# Patient Record
Sex: Female | Born: 1960 | Race: White | Hispanic: No | Marital: Married | Smoking: Never smoker
Health system: Southern US, Community
[De-identification: ages and names within clinical notes are randomized; demographics above are authoritative.]

## PROBLEM LIST (undated history)

## (undated) DIAGNOSIS — Z7901 Long term (current) use of anticoagulants: Secondary | ICD-10-CM

## (undated) DIAGNOSIS — Z5189 Encounter for other specified aftercare: Secondary | ICD-10-CM

## (undated) DIAGNOSIS — C19 Malignant neoplasm of rectosigmoid junction: Secondary | ICD-10-CM

## (undated) DIAGNOSIS — F329 Major depressive disorder, single episode, unspecified: Secondary | ICD-10-CM

## (undated) DIAGNOSIS — G620 Drug-induced polyneuropathy: Secondary | ICD-10-CM

## (undated) DIAGNOSIS — D6481 Anemia due to antineoplastic chemotherapy: Secondary | ICD-10-CM

## (undated) DIAGNOSIS — N1330 Unspecified hydronephrosis: Secondary | ICD-10-CM

## (undated) DIAGNOSIS — K219 Gastro-esophageal reflux disease without esophagitis: Secondary | ICD-10-CM

## (undated) DIAGNOSIS — Z86711 Personal history of pulmonary embolism: Secondary | ICD-10-CM

## (undated) DIAGNOSIS — F419 Anxiety disorder, unspecified: Secondary | ICD-10-CM

## (undated) DIAGNOSIS — Z923 Personal history of irradiation: Secondary | ICD-10-CM

## (undated) DIAGNOSIS — F32A Depression, unspecified: Secondary | ICD-10-CM

## (undated) DIAGNOSIS — T7840XA Allergy, unspecified, initial encounter: Secondary | ICD-10-CM

## (undated) DIAGNOSIS — R300 Dysuria: Secondary | ICD-10-CM

## (undated) DIAGNOSIS — T451X5A Adverse effect of antineoplastic and immunosuppressive drugs, initial encounter: Secondary | ICD-10-CM

## (undated) HISTORY — PX: COLONOSCOPY: SHX174

## (undated) HISTORY — DX: Encounter for other specified aftercare: Z51.89

## (undated) HISTORY — DX: Allergy, unspecified, initial encounter: T78.40XA

---

## 2014-10-03 ENCOUNTER — Encounter (HOSPITAL_COMMUNITY): Payer: Self-pay | Admitting: *Deleted

## 2014-10-03 ENCOUNTER — Inpatient Hospital Stay (HOSPITAL_COMMUNITY): Payer: BC Managed Care – PPO

## 2014-10-03 ENCOUNTER — Inpatient Hospital Stay (HOSPITAL_COMMUNITY)
Admission: AD | Admit: 2014-10-03 | Discharge: 2014-10-18 | DRG: 330 | Disposition: A | Discharge status: Home / Self Care | Payer: BC Managed Care – PPO | Source: Other Acute Inpatient Hospital | Attending: General Surgery | Admitting: General Surgery

## 2014-10-03 DIAGNOSIS — K5190 Ulcerative colitis, unspecified, without complications: Secondary | ICD-10-CM | POA: Diagnosis present

## 2014-10-03 DIAGNOSIS — D638 Anemia in other chronic diseases classified elsewhere: Secondary | ICD-10-CM | POA: Diagnosis present

## 2014-10-03 DIAGNOSIS — F329 Major depressive disorder, single episode, unspecified: Secondary | ICD-10-CM | POA: Diagnosis present

## 2014-10-03 DIAGNOSIS — C187 Malignant neoplasm of sigmoid colon: Secondary | ICD-10-CM | POA: Diagnosis present

## 2014-10-03 DIAGNOSIS — C172 Malignant neoplasm of ileum: Principal | ICD-10-CM | POA: Diagnosis present

## 2014-10-03 DIAGNOSIS — Z800 Family history of malignant neoplasm of digestive organs: Secondary | ICD-10-CM

## 2014-10-03 DIAGNOSIS — E875 Hyperkalemia: Secondary | ICD-10-CM | POA: Diagnosis not present

## 2014-10-03 DIAGNOSIS — E785 Hyperlipidemia, unspecified: Secondary | ICD-10-CM | POA: Diagnosis present

## 2014-10-03 DIAGNOSIS — Z8709 Personal history of other diseases of the respiratory system: Secondary | ICD-10-CM

## 2014-10-03 DIAGNOSIS — K56600 Partial intestinal obstruction, unspecified as to cause: Secondary | ICD-10-CM

## 2014-10-03 DIAGNOSIS — C179 Malignant neoplasm of small intestine, unspecified: Secondary | ICD-10-CM

## 2014-10-03 DIAGNOSIS — N131 Hydronephrosis with ureteral stricture, not elsewhere classified: Secondary | ICD-10-CM | POA: Diagnosis present

## 2014-10-03 DIAGNOSIS — K567 Ileus, unspecified: Secondary | ICD-10-CM | POA: Diagnosis not present

## 2014-10-03 DIAGNOSIS — K219 Gastro-esophageal reflux disease without esophagitis: Secondary | ICD-10-CM | POA: Diagnosis present

## 2014-10-03 DIAGNOSIS — N1330 Unspecified hydronephrosis: Secondary | ICD-10-CM

## 2014-10-03 DIAGNOSIS — R109 Unspecified abdominal pain: Secondary | ICD-10-CM | POA: Diagnosis present

## 2014-10-03 DIAGNOSIS — D72829 Elevated white blood cell count, unspecified: Secondary | ICD-10-CM | POA: Diagnosis not present

## 2014-10-03 DIAGNOSIS — Z95828 Presence of other vascular implants and grafts: Secondary | ICD-10-CM

## 2014-10-03 DIAGNOSIS — K56609 Unspecified intestinal obstruction, unspecified as to partial versus complete obstruction: Secondary | ICD-10-CM | POA: Diagnosis present

## 2014-10-03 HISTORY — DX: Depression, unspecified: F32.A

## 2014-10-03 HISTORY — DX: Gastro-esophageal reflux disease without esophagitis: K21.9

## 2014-10-03 HISTORY — DX: Major depressive disorder, single episode, unspecified: F32.9

## 2014-10-03 LAB — CBC
HCT: 29.7 % — ABNORMAL LOW (ref 36.0–46.0)
HEMOGLOBIN: 8.5 g/dL — AB (ref 12.0–15.0)
MCH: 20.5 pg — AB (ref 26.0–34.0)
MCHC: 28.6 g/dL — AB (ref 30.0–36.0)
MCV: 71.7 fL — ABNORMAL LOW (ref 78.0–100.0)
Platelets: 335 10*3/uL (ref 150–400)
RBC: 4.14 MIL/uL (ref 3.87–5.11)
RDW: 18.4 % — ABNORMAL HIGH (ref 11.5–15.5)
WBC: 6.7 10*3/uL (ref 4.0–10.5)

## 2014-10-03 LAB — BASIC METABOLIC PANEL
Anion gap: 15 (ref 5–15)
BUN: 12 mg/dL (ref 6–23)
CALCIUM: 8.5 mg/dL (ref 8.4–10.5)
CHLORIDE: 102 meq/L (ref 96–112)
CO2: 21 mEq/L (ref 19–32)
Creatinine, Ser: 0.86 mg/dL (ref 0.50–1.10)
GFR, EST AFRICAN AMERICAN: 88 mL/min — AB (ref >90)
GFR, EST NON AFRICAN AMERICAN: 76 mL/min — AB (ref >90)
Glucose, Bld: 132 mg/dL — ABNORMAL HIGH (ref 70–99)
Potassium: 3.6 mEq/L — ABNORMAL LOW (ref 3.7–5.3)
Sodium: 138 mEq/L (ref 137–147)

## 2014-10-03 MED ORDER — HYDROMORPHONE HCL 1 MG/ML IJ SOLN
0.5000 mg | INTRAMUSCULAR | Status: DC | PRN
  Administered 2014-10-03 – 2014-10-06 (×3): 1 mg via INTRAVENOUS
  Administered 2014-10-06: 0.5 mg via INTRAVENOUS
  Filled 2014-10-03 (×4): qty 1

## 2014-10-03 MED ORDER — ONDANSETRON HCL 4 MG/2ML IJ SOLN: 4.0000 mg | Freq: Four times a day (QID) | INTRAMUSCULAR | Status: DC | PRN

## 2014-10-03 MED ORDER — ACETAMINOPHEN 650 MG RE SUPP: 650.0000 mg | Freq: Four times a day (QID) | RECTAL | Status: DC | PRN

## 2014-10-03 MED ORDER — DIPHENHYDRAMINE HCL 50 MG/ML IJ SOLN: 12.5000 mg | Freq: Four times a day (QID) | INTRAMUSCULAR | Status: DC | PRN

## 2014-10-03 MED ORDER — PANTOPRAZOLE SODIUM 40 MG IV SOLR
40.0000 mg | Freq: Every day | INTRAVENOUS | Status: DC
  Administered 2014-10-03 – 2014-10-15 (×14): 40 mg via INTRAVENOUS
  Filled 2014-10-03 (×22): qty 40

## 2014-10-03 MED ORDER — ZOLPIDEM TARTRATE 5 MG PO TABS
5.0000 mg | ORAL_TABLET | Freq: Every evening | ORAL | Status: DC | PRN
  Administered 2014-10-03 – 2014-10-16 (×9): 5 mg via ORAL
  Filled 2014-10-03 (×10): qty 1

## 2014-10-03 MED ORDER — CIPROFLOXACIN IN D5W 400 MG/200ML IV SOLN
400.0000 mg | Freq: Two times a day (BID) | INTRAVENOUS | Status: DC
  Administered 2014-10-03 – 2014-10-05 (×5): 400 mg via INTRAVENOUS
  Filled 2014-10-03 (×6): qty 200

## 2014-10-03 MED ORDER — GADOBENATE DIMEGLUMINE 529 MG/ML IV SOLN
20.0000 mL | Freq: Once | INTRAVENOUS | Status: AC | PRN
Start: 2014-10-03 — End: 2014-10-03

## 2014-10-03 MED ORDER — ENOXAPARIN SODIUM 40 MG/0.4ML ~~LOC~~ SOLN
40.0000 mg | SUBCUTANEOUS | Status: DC
  Administered 2014-10-03 – 2014-10-16 (×13): 40 mg via SUBCUTANEOUS
  Filled 2014-10-03 (×15): qty 0.4

## 2014-10-03 MED ORDER — KCL IN DEXTROSE-NACL 10-5-0.45 MEQ/L-%-% IV SOLN
INTRAVENOUS | Status: DC
  Administered 2014-10-03: 125 mL via INTRAVENOUS
  Administered 2014-10-03 – 2014-10-05 (×4): via INTRAVENOUS
  Administered 2014-10-05: 125 mL/h via INTRAVENOUS
  Administered 2014-10-06: 14:00:00 via INTRAVENOUS
  Filled 2014-10-03 (×14): qty 1000

## 2014-10-03 MED ORDER — DIPHENHYDRAMINE HCL 12.5 MG/5ML PO ELIX: 12.5000 mg | ORAL_SOLUTION | Freq: Four times a day (QID) | ORAL | Status: DC | PRN

## 2014-10-03 MED ORDER — IOHEXOL 300 MG/ML  SOLN
75.0000 mL | Freq: Once | INTRAMUSCULAR | Status: AC | PRN
  Administered 2014-10-03: 75 mL via INTRAVENOUS

## 2014-10-03 MED ORDER — CETYLPYRIDINIUM CHLORIDE 0.05 % MT LIQD
7.0000 mL | Freq: Two times a day (BID) | OROMUCOSAL | Status: DC
  Administered 2014-10-03 – 2014-10-15 (×24): 7 mL via OROMUCOSAL

## 2014-10-03 MED ORDER — METRONIDAZOLE IN NACL 5-0.79 MG/ML-% IV SOLN
500.0000 mg | Freq: Three times a day (TID) | INTRAVENOUS | Status: DC
  Administered 2014-10-03 – 2014-10-05 (×8): 500 mg via INTRAVENOUS
  Filled 2014-10-03 (×9): qty 100

## 2014-10-03 MED ORDER — ACETAMINOPHEN 325 MG PO TABS
650.0000 mg | ORAL_TABLET | Freq: Four times a day (QID) | ORAL | Status: DC | PRN
  Administered 2014-10-03: 650 mg via ORAL
  Filled 2014-10-03: qty 2

## 2014-10-03 NOTE — Plan of Care (Signed)
Problem: Phase I Progression Outcomes Goal: Voiding-avoid urinary catheter unless indicated Outcome: Completed/Met Date Met:  10/03/14     

## 2014-10-03 NOTE — Plan of Care (Signed)
Problem: Phase I Progression Outcomes Goal: OOB as tolerated unless otherwise ordered Outcome: Completed/Met Date Met:  10/03/14     

## 2014-10-03 NOTE — Progress Notes (Signed)
Patient ID: Royce Sciara, female   DOB: 01/16/1961, 53 y.o.   MRN: [id number]    Subjective: Pt feels better this morning.  No pain currently.  Does not complain of any issues with voiding.  No further nausea or vomiting since 2 days ago.  She is not passing any flatus or had a BM.  Objective: Vital signs in last 24 hours: Temp:  [98.3 F (36.8 C)-98.4 F (36.9 C)] 98.3 F (36.8 C) (12/01 1010) Pulse Rate:  [78-90] 78 (12/01 1010) Resp:  [18-20] 20 (12/01 1010) BP: (131-155)/(62-76) 131/62 mmHg (12/01 1010) SpO2:  [97 %-98 %] 97 % (12/01 1010) Weight:  [227 lb (102.967 kg)] 227 lb (102.967 kg) (11/30 2300) Last BM Date: 09/30/14  Intake/Output from previous day: 11/30 0701 - 12/01 0700 In: 700 [I.V.:700] Out: -  Intake/Output this shift:    PE: Abd: soft, NT currently, ND, hypoactive BS Heart: regular Lungs: CTAB  Lab Results:   Recent Labs  10/03/14 0442  WBC 6.7  HGB 8.5*  HCT 29.7*  PLT 335   BMET  Recent Labs  10/03/14 0442  NA 138  K 3.6*  CL 102  CO2 21  GLUCOSE 132*  BUN 12  CREATININE 0.86  CALCIUM 8.5   PT/INR No results for input(s): LABPROT, INR in the last 72 hours. CMP     Component Value Date/Time   NA 138 10/03/2014 0442   K 3.6* 10/03/2014 0442   CL 102 10/03/2014 0442   CO2 21 10/03/2014 0442   GLUCOSE 132* 10/03/2014 0442   BUN 12 10/03/2014 0442   CREATININE 0.86 10/03/2014 0442   CALCIUM 8.5 10/03/2014 0442   GFRNONAA 76* 10/03/2014 0442   GFRAA 88* 10/03/2014 0442   Lipase  No results found for: LIPASE     Studies/Results: No results found.  Anti-infectives: Anti-infectives    Start     Dose/Rate Route Frequency Ordered Stop   10/03/14 0200  ciprofloxacin (CIPRO) IVPB 400 mg     400 mg200 mL/hr over 60 Minutes Intravenous Every 12 hours 10/03/14 0055     10/03/14 0200  metroNIDAZOLE (FLAGYL) IVPB 500 mg     500 mg100 mL/hr over 60 Minutes Intravenous Every 8 hours 10/03/14 0055         Assessment/Plan   1.  RLQ pain 2. RLQ inflammatory process vs mass, ? crohns 3. Obstruction of right ureter 4. PSBO secondary to #2  Plan: 1. I have contacted Dr. Franz Dell office in [place], [place].  They have been told her biopsy results should be back today.  They have my pager number to call me when these are ready.  If these are positive for crohn's disease then GI will need to be called here.   2. I have consulted Dr. Tresa Moore with urology who states there is nothing to do right now for her ureter given her normal creatinine, but he will be happy to see her. 3. cont NPO for today x ice chips 4. Cont abx therapy  LOS: 0 days    Saharah Sherrow E 10/03/2014, 10:50 AM Pager: [phone]

## 2014-10-03 NOTE — H&P (Signed)
Allison Mitchell is an 53 y.o. female.   Chief Complaint: Abdominal pain with nausea and vomiting HPI: Patient recalls having some problems dating back to August 2014.  Workup at that time included being seen at [facility] where patient was found to have bilateral pleural effusions, but no other problem ccausing her abdominal pain.  Thsi current episode started last Thursday and had progressed since that time to severe pain on presentation today.  She wen to the [facility] where the surgeon on call did not feel comfortable taking care of this problem.  A referral to [facility] was denied, and [facility] was the family's second choice, but they definitely want to leave [facility].  The patient had a colonoscopy 6-7 days ago, and multiple biopsies were taken.  All the results are currently pending.  No past medical history on file.  No past surgical history on file.  No family history on file. Social History:  has no tobacco, alcohol, and drug history on file.  Allergies: Allergies not on file  No prescriptions prior to admission    No results found for this or any previous visit (from the past 48 hour(s)). No results found.  Review of Systems  Constitutional: Positive for fever and chills.  Eyes: Negative.   Respiratory: Negative.   Cardiovascular: Negative.   Gastrointestinal: Positive for heartburn, nausea, vomiting, abdominal pain and constipation. Negative for diarrhea and blood in stool.  Genitourinary: Negative.   Musculoskeletal: Negative.   Skin: Negative.   Neurological: Negative.   Endo/Heme/Allergies: Negative.   Psychiatric/Behavioral: Negative.     Blood pressure 155/76, pulse 90, temperature 98.4 F (36.9 C), temperature source Oral, resp. rate 18, height 5\' 8"  (1.727 m), weight 102.967 kg (227 lb), SpO2 98 %. Physical Exam  Vitals reviewed. Constitutional: She is oriented to person, place, and time. She appears well-developed and well-nourished.  HENT:    Head: Normocephalic and atraumatic.  Eyes: Conjunctivae and EOM are normal. Pupils are equal, round, and reactive to light.  Neck: Normal range of motion. Neck supple.  Cardiovascular: Normal rate, regular rhythm and normal heart sounds.   No murmur heard. Respiratory: Effort normal and breath sounds normal. She has no rales.  GI: Soft. Normal appearance and bowel sounds are normal. She exhibits mass. There is tenderness. There is guarding. There is no rebound.    Musculoskeletal: Normal range of motion. She exhibits no edema or tenderness.  Lymphadenopathy:       Head (right side): No submental and no submandibular adenopathy present.       Head (left side): No submental and no submandibular adenopathy present.    She has no cervical adenopathy.    She has no axillary adenopathy.       Right: No epitrochlear adenopathy present.       Left: No epitrochlear adenopathy present.  Neurological: She is alert and oriented to person, place, and time. She has normal reflexes.  Skin: Skin is dry.  Psychiatric: She has a normal mood and affect. Her behavior is normal. Judgment and thought content normal.     Assessment/Plan I have reviewed the patient's studies from the outside institution.  The patient has a slightly off midline either inflammatory mass or a tumor, obstructing the right ureter and partially obstruction the distal small bowel.  The GI MD in [facility] performed a colonoscopy and endoscopy .  During the colonoscopy several biopsies were taken, non of them were available at the beginning of this note.  The patient  was transferred from [facility] mere midnight for further evaluation h ere.  I favor this being a benign, inflammatory process, and therefore I will place the patient on cipro and flagyl.  GI consultation may be necessary.  Urology will definitely be needed to free the obstructed right ureter from this inflammatory mass when surgery is necessary. Although the patient  has a partial bowel obstruction, I do not think the patient would benefit for an NGT at this time, but she would definitely need one postoperatively. NPO except for ice chips and sips with meds. Kellis Mcadam, JAY  10/03/2014, 12:55 AM

## 2014-10-03 NOTE — Consult Note (Signed)
Reason for Consult: Right Hydronephrosis, Colon Cancer  Referring Physician: Chucky May MD  Allison Mitchell is an 53 y.o. female.   HPI:   1 - Right Hydronephrosis, Likely Malignant Obstruction from GI Cancer - new severe right hydro by CT this admission to level of RLQ colon / distal small bowel mass now biopsy-proven colon cancer. Cr <1. No contralateral lesions. Hydro appears quite chronic, but preserved rt renal parenchyma. Unclear if purely extrinsic v. Any direct ureteral invasion by imaging alone.   PMH unremarkable. No CV disease / blood thinners. No prior abd surgery.  Today "Allison Mitchell" is seen in consultation for above.   Past Medical History  Diagnosis Date  . GERD (gastroesophageal reflux disease)   . Depression     History reviewed. No pertinent past surgical history.  History reviewed. No pertinent family history.  Social History:  reports that she has never smoked. She does not have any smokeless tobacco history on file. She reports that she does not drink alcohol or use illicit drugs.  Allergies: No Known Allergies  Medications: I have reviewed the patient's current medications.  Results for orders placed or performed during the hospital encounter of 10/03/14 (from the past 48 hour(s))  Basic metabolic panel     Status: Abnormal   Collection Time: 10/03/14  4:42 AM  Result Value Ref Range   Sodium 138 137 - 147 mEq/L   Potassium 3.6 (L) 3.7 - 5.3 mEq/L   Chloride 102 96 - 112 mEq/L   CO2 21 19 - 32 mEq/L   Glucose, Bld 132 (H) 70 - 99 mg/dL   BUN 12 6 - 23 mg/dL   Creatinine, Ser 0.86 0.50 - 1.10 mg/dL   Calcium 8.5 8.4 - 10.5 mg/dL   GFR calc non Af Amer 76 (L) >90 mL/min   GFR calc Af Amer 88 (L) >90 mL/min    Comment: (NOTE) The eGFR has been calculated using the CKD EPI equation. This calculation has not been validated in all clinical situations. eGFR's persistently <90 mL/min signify possible Chronic Kidney Disease.    Anion gap 15 5 - 15  CBC      Status: Abnormal   Collection Time: 10/03/14  4:42 AM  Result Value Ref Range   WBC 6.7 4.0 - 10.5 K/uL   RBC 4.14 3.87 - 5.11 MIL/uL   Hemoglobin 8.5 (L) 12.0 - 15.0 g/dL   HCT 29.7 (L) 36.0 - 46.0 %   MCV 71.7 (L) 78.0 - 100.0 fL   MCH 20.5 (L) 26.0 - 34.0 pg   MCHC 28.6 (L) 30.0 - 36.0 g/dL   RDW 18.4 (H) 11.5 - 15.5 %   Platelets 335 150 - 400 K/uL    Ct Chest W Contrast  10/03/2014   CLINICAL DATA:  Small bowel cancer. Metastatic disease to the chest.  EXAM: CT CHEST WITH CONTRAST  TECHNIQUE: Multidetector CT imaging of the chest was performed during intravenous contrast administration.  CONTRAST:  30m OMNIPAQUE IOHEXOL 300 MG/ML  SOLN  COMPARISON:  CT 10/02/2014.  FINDINGS: Bones: No aggressive osseous lesions.  Cardiovascular: Normal 3 vessel aortic arch. Mild arch atherosclerosis. Heart grossly normal.  Lungs: Dependent atelectasis. No pulmonary nodules or findings suspicious for pulmonary metastatic disease.  Central airways: Normal.  Effusions: None.  Lymphadenopathy: None.  Esophagus: Small hiatal hernia.  Upper abdomen: Visible upper abdomen appears within normal limits. Vicarious excretion of contrast into the gallbladder.  Other: None.  IMPRESSION: 1. Negative for metastatic disease to the chest. 2.  Small hiatal hernia.   Electronically Signed   By: Dereck Ligas M.D.   On: 10/03/2014 18:32   Mr Liver W Wo Contrast  10/03/2014   CLINICAL DATA:  Evaluate for hepatic metastatic disease.  EXAM: MRI ABDOMEN WITHOUT AND WITH CONTRAST  TECHNIQUE: Multiplanar multisequence MR imaging of the abdomen was performed both before and after the administration of intravenous contrast.  CONTRAST:  20 cc MultiHance  COMPARISON:  CT scan 10/02/2014  FINDINGS: Lower chest:  No significant findings.  Hepatobiliary: Diffuse fatty infiltration of the liver is noted. There are a few tiny scattered nonenhancing hepatic cyst. No worrisome hepatic lesions. No intrahepatic biliary dilatation. The gallbladder  is normal. No common bile duct dilatation.  Pancreas: Normal.  Spleen: Normal.  Adrenals/Urinary Tract: The adrenal glands are normal. The left kidney is normal. The right kidney demonstrates severe hydronephrosis and is also severe right hydroureter down to an obstructing inflammatory "Mass" in the right upper pelvis.  Stomach/Bowel: Dilated small bowel loops consistent with a small bowel obstruction. The colon is also distended with stool. The right lower quadrant/upper pelvic process is not covered on this examination except for the coronal images. It looks similar to a prior study from 2014 and is most likely an acute on chronic inflammatory process such as Crohn's disease or other inflammatory bowel disease.  Vascular/Lymphatic: No mesenteric or retroperitoneal mass or adenopathy. The aorta and branch vessels are patent. The major venous structures are patent.  Other: No ascites or abdominal wall hernia.  Musculoskeletal: No significant bony findings.  IMPRESSION: 1. Small hepatic cysts but no findings suspicious for hepatic metastatic disease. 2. Right-sided hydroureteronephrosis as demonstrated on the CT scan due to an obstructing probable acute on chronic inflammatory bowel process in the right lower quadrant/ right upper pelvis. 3. Early small bowel obstruction bowel gas pattern.   Electronically Signed   By: Kalman Jewels M.D.   On: 10/03/2014 19:01    Review of Systems  Constitutional: Negative.   HENT: Negative.   Eyes: Negative.   Respiratory: Negative.   Cardiovascular: Negative.   Gastrointestinal: Positive for nausea. Negative for vomiting.  Genitourinary: Negative.  Negative for frequency, hematuria and flank pain.  Musculoskeletal: Negative.   Skin: Negative.   Neurological: Negative.   Endo/Heme/Allergies: Negative.   Psychiatric/Behavioral: Negative.    Blood pressure 138/73, pulse 80, temperature 98.6 F (37 C), temperature source Oral, resp. rate 19, height _0  (1.727 m),  weight 102.967 kg (227 lb), SpO2 99 %. Physical Exam  Constitutional: She appears well-developed and well-nourished.  Husband at bedside  HENT:  Head: Normocephalic and atraumatic.  Eyes: Pupils are equal, round, and reactive to light.  Neck: Normal range of motion.  Cardiovascular: Normal rate.   Respiratory: Effort normal.  GI: Soft.  Non-distended, no scars  Genitourinary:  No CVAT  Musculoskeletal: Normal range of motion.  Neurological: She is alert.  Skin: Skin is warm and dry.  Psychiatric: She has a normal mood and affect. Her behavior is normal. Judgment and thought content normal.    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 Fortunately renal function excellent.  Should primary team want to proceed with surgery in near term, I would recommend cysto / right retrograde / diagnostic ureteroscopy / stent with goal of ruling out obvious transmural ureteral invasion (so as to prepare for ureteral reconstruction as a part  of bowel resection) and place stent to maximize renal function and help aid in intraoperative dissection during future colon surgery. Will discuss with primary team timing and logistics.  Will follow. KUB today to help see if any prior contrast makes it to bladder from right kidney.     Angelette Ganus 10/03/2014, 7:22 PM

## 2014-10-04 ENCOUNTER — Inpatient Hospital Stay (HOSPITAL_COMMUNITY): Payer: BC Managed Care – PPO

## 2014-10-04 ENCOUNTER — Other Ambulatory Visit: Payer: Self-pay | Admitting: Urology

## 2014-10-04 DIAGNOSIS — N1339 Other hydronephrosis: Secondary | ICD-10-CM

## 2014-10-04 DIAGNOSIS — C188 Malignant neoplasm of overlapping sites of colon: Secondary | ICD-10-CM

## 2014-10-04 DIAGNOSIS — D638 Anemia in other chronic diseases classified elsewhere: Secondary | ICD-10-CM

## 2014-10-04 LAB — BASIC METABOLIC PANEL
Anion gap: 14 (ref 5–15)
BUN: 10 mg/dL (ref 6–23)
CO2: 22 mEq/L (ref 19–32)
CREATININE: 0.89 mg/dL (ref 0.50–1.10)
Calcium: 8.5 mg/dL (ref 8.4–10.5)
Chloride: 104 mEq/L (ref 96–112)
GFR calc non Af Amer: 73 mL/min — ABNORMAL LOW (ref >90)
GFR, EST AFRICAN AMERICAN: 84 mL/min — AB (ref >90)
Glucose, Bld: 100 mg/dL — ABNORMAL HIGH (ref 70–99)
Potassium: 3.8 mEq/L (ref 3.7–5.3)
Sodium: 140 mEq/L (ref 137–147)

## 2014-10-04 LAB — PREALBUMIN: PREALBUMIN: 20.2 mg/dL (ref 17.0–34.0)

## 2014-10-04 LAB — CBC
HCT: 27.4 % — ABNORMAL LOW (ref 36.0–46.0)
Hemoglobin: 7.9 g/dL — ABNORMAL LOW (ref 12.0–15.0)
MCH: 20.7 pg — ABNORMAL LOW (ref 26.0–34.0)
MCHC: 28.8 g/dL — ABNORMAL LOW (ref 30.0–36.0)
MCV: 71.9 fL — AB (ref 78.0–100.0)
PLATELETS: 297 10*3/uL (ref 150–400)
RBC: 3.81 MIL/uL — ABNORMAL LOW (ref 3.87–5.11)
RDW: 18.5 % — AB (ref 11.5–15.5)
WBC: 5.6 10*3/uL (ref 4.0–10.5)

## 2014-10-04 MED ORDER — GENTAMICIN IN SALINE 1.6-0.9 MG/ML-% IV SOLN
80.0000 mg | INTRAVENOUS | Status: AC
  Administered 2014-10-05: 80 mg via INTRAVENOUS
  Filled 2014-10-04: qty 50

## 2014-10-04 NOTE — Progress Notes (Signed)
Called [facility] and spoke nurse Theadora Rama caring for Allison Mitchell, the plan is that she will be transported from [facility] to [place] and then back to [facility] post surgery.

## 2014-10-04 NOTE — Anesthesia Preprocedure Evaluation (Signed)
Anesthesia Evaluation  Patient identified by MRN, date of birth, ID band Patient awake    Reviewed: Allergy & Precautions, H&P , NPO status , Patient's Chart, lab work & pertinent test results  Airway Mallampati: II  TM Distance: >3 FB Neck ROM: Full    Dental no notable dental hx. (+) Dental Advisory Given   Pulmonary neg pulmonary ROS,  breath sounds clear to auscultation  Pulmonary exam normal       Cardiovascular negative cardio ROS  Rhythm:Regular Rate:Normal     Neuro/Psych PSYCHIATRIC DISORDERS Anxiety Depression negative neurological ROS  negative psych ROS   GI/Hepatic Neg liver ROS, GERD-  Medicated and Controlled,Adenocarcinoma and obstructing mass   Endo/Other  negative endocrine ROS  Renal/GU Renal disease (R hydronephrosis)  negative genitourinary   Musculoskeletal negative musculoskeletal ROS (+)   Abdominal   Peds negative pediatric ROS (+)  Hematology negative hematology ROS (+)   Anesthesia Other Findings   Reproductive/Obstetrics negative OB ROS                             Anesthesia Physical Anesthesia Plan  ASA: II  Anesthesia Plan: General   Post-op Pain Management:    Induction: Intravenous  Airway Management Planned: LMA and Oral ETT  Additional Equipment:   Intra-op Plan:   Post-operative Plan: Extubation in OR  Informed Consent: I have reviewed the patients History and Physical, chart, labs and discussed the procedure including the risks, benefits and alternatives for the proposed anesthesia with the patient or authorized representative who has indicated his/her understanding and acceptance.   Dental advisory given  Plan Discussed with: CRNA  Anesthesia Plan Comments:         Anesthesia Quick Evaluation

## 2014-10-04 NOTE — Progress Notes (Signed)
Patient ID: Allison Mitchell, female   DOB: Jan 05, 1961, 53 y.o.   MRN: [id number]    Subjective: Pt feels well today, in good spirits.  Tolerating clear liquids with no nausea or vomiting.  She is passing flatus, but no BM.  Objective: Vital signs in last 24 hours: Temp:  [98 F (36.7 C)-98.7 F (37.1 C)] 98 F (36.7 C) (12/02 0547) Pulse Rate:  [72-80] 72 (12/02 0547) Resp:  [17-19] 18 (12/02 0547) BP: (130-138)/(65-73) 130/70 mmHg (12/02 0547) SpO2:  [97 %-99 %] 98 % (12/02 0547) Last BM Date: 09/30/14  Intake/Output from previous day: 12/01 0701 - 12/02 0700 In: 2400 [I.V.:2400] Out: 1000 [Urine:1000] Intake/Output this shift: Total I/O In: -  Out: 600 [Urine:600]  PE: Abd: soft, Nt, ND, +BS Heart: regular Lungs: CTAB  Lab Results:   Recent Labs  10/03/14 0442 10/04/14 0546  WBC 6.7 5.6  HGB 8.5* 7.9*  HCT 29.7* 27.4*  PLT 335 297   BMET  Recent Labs  10/03/14 0442 10/04/14 0546  NA 138 140  K 3.6* 3.8  CL 102 104  CO2 21 22  GLUCOSE 132* 100*  BUN 12 10  CREATININE 0.86 0.89  CALCIUM 8.5 8.5   PT/INR No results for input(s): LABPROT, INR in the last 72 hours. CMP     Component Value Date/Time   NA 140 10/04/2014 0546   K 3.8 10/04/2014 0546   CL 104 10/04/2014 0546   CO2 22 10/04/2014 0546   GLUCOSE 100* 10/04/2014 0546   BUN 10 10/04/2014 0546   CREATININE 0.89 10/04/2014 0546   CALCIUM 8.5 10/04/2014 0546   GFRNONAA 73* 10/04/2014 0546   GFRAA 84* 10/04/2014 0546   Lipase  No results found for: LIPASE     Studies/Results: Dg Chest 2 View  10/04/2014   CLINICAL DATA:  Shortness of breath.  Pleural effusion.  EXAM: CHEST  2 VIEW  COMPARISON:  CT chest 12/01/ 2015.  Chest x-ray 08/22/2008.  FINDINGS: Mediastinum and hilar structures normal. Mild left basilar atelectasis. No pleural effusion or pneumothorax. No acute osseous abnormality.  IMPRESSION: Mild left basilar atelectasis, no acute cardiopulmonary disease otherwise noted.    Electronically Signed   By: Marcello Moores  Register   On: 10/04/2014 08:01   Dg Abd 1 View  10/04/2014   CLINICAL DATA:  Assess for retention of contrast within the right renal collecting system, given known hydronephrosis. Subsequent encounter.  EXAM: ABDOMEN - 1 VIEW  COMPARISON:  CT of the chest performed earlier today at 6:24 p.m.  FINDINGS: Contrast is noted filling a dilated right renal collecting system, with distention of the right ureter as previously noted. Trace contrast is suggested within the left renal collecting system. The bladder is filled with contrast.  Evaluation is mildly suboptimal due to contrast within the colon. No acute osseous abnormalities are seen.  IMPRESSION: Contrast again noted filling a dilated right renal collecting system, with diffuse distention of the right ureter. This is compatible with the patient's known right-sided hydroureteronephrosis.   Electronically Signed   By: Garald Balding M.D.   On: 10/04/2014 02:03   Ct Chest W Contrast  10/03/2014   CLINICAL DATA:  Small bowel cancer. Metastatic disease to the chest.  EXAM: CT CHEST WITH CONTRAST  TECHNIQUE: Multidetector CT imaging of the chest was performed during intravenous contrast administration.  CONTRAST:  78mL OMNIPAQUE IOHEXOL 300 MG/ML  SOLN  COMPARISON:  CT 10/02/2014.  FINDINGS: Bones: No aggressive osseous lesions.  Cardiovascular: Normal 3 vessel aortic  arch. Mild arch atherosclerosis. Heart grossly normal.  Lungs: Dependent atelectasis. No pulmonary nodules or findings suspicious for pulmonary metastatic disease.  Central airways: Normal.  Effusions: None.  Lymphadenopathy: None.  Esophagus: Small hiatal hernia.  Upper abdomen: Visible upper abdomen appears within normal limits. Vicarious excretion of contrast into the gallbladder.  Other: None.  IMPRESSION: 1. Negative for metastatic disease to the chest. 2. Small hiatal hernia.   Electronically Signed   By: Dereck Ligas M.D.   On: 10/03/2014 18:32   Mr  Liver W Wo Contrast  10/03/2014   CLINICAL DATA:  Evaluate for hepatic metastatic disease.  EXAM: MRI ABDOMEN WITHOUT AND WITH CONTRAST  TECHNIQUE: Multiplanar multisequence MR imaging of the abdomen was performed both before and after the administration of intravenous contrast.  CONTRAST:  20 cc MultiHance  COMPARISON:  CT scan 10/02/2014  FINDINGS: Lower chest:  No significant findings.  Hepatobiliary: Diffuse fatty infiltration of the liver is noted. There are a few tiny scattered nonenhancing hepatic cyst. No worrisome hepatic lesions. No intrahepatic biliary dilatation. The gallbladder is normal. No common bile duct dilatation.  Pancreas: Normal.  Spleen: Normal.  Adrenals/Urinary Tract: The adrenal glands are normal. The left kidney is normal. The right kidney demonstrates severe hydronephrosis and is also severe right hydroureter down to an obstructing inflammatory "Mass" in the right upper pelvis.  Stomach/Bowel: Dilated small bowel loops consistent with a small bowel obstruction. The colon is also distended with stool. The right lower quadrant/upper pelvic process is not covered on this examination except for the coronal images. It looks similar to a prior study from 2014 and is most likely an acute on chronic inflammatory process such as Crohn's disease or other inflammatory bowel disease.  Vascular/Lymphatic: No mesenteric or retroperitoneal mass or adenopathy. The aorta and branch vessels are patent. The major venous structures are patent.  Other: No ascites or abdominal wall hernia.  Musculoskeletal: No significant bony findings.  IMPRESSION: 1. Small hepatic cysts but no findings suspicious for hepatic metastatic disease. 2. Right-sided hydroureteronephrosis as demonstrated on the CT scan due to an obstructing probable acute on chronic inflammatory bowel process in the right lower quadrant/ right upper pelvis. 3. Early small bowel obstruction bowel gas pattern.   Electronically Signed   By: Kalman Jewels M.D.   On: 10/03/2014 19:01    Anti-infectives: Anti-infectives    Start     Dose/Rate Route Frequency Ordered Stop   10/03/14 0200  ciprofloxacin (CIPRO) IVPB 400 mg     400 mg200 mL/hr over 60 Minutes Intravenous Every 12 hours 10/03/14 0055     10/03/14 0200  metroNIDAZOLE (FLAGYL) IVPB 500 mg     500 mg100 mL/hr over 60 Minutes Intravenous Every 8 hours 10/03/14 0055         Assessment/Plan  1. 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 Right ureteral obstruction secondary to #1, ? Direct invasion vs extrinsic compression  3. PSBO secondary to #1  Plan: 1. I have discussed this situation with Dr. Tresa Moore this morning.  He will plan on cystoscopy.uretoscopy/stent placement tomorrow at 12:30pm at [facility].  The goal here is to determine if there is direct invasion of the ureter vs extrinsic compression from the tumor.  This will further determine how to proceed with surgical intervention.  From a general surgery perspective, we are anticipation right hemicolectomy with primary anastomosis with sigmoid colectomy and end colostomy.  Timing to be determined after procedure tomorrow. 2. Prealbumin  pending to determine a need for TNA for nutritional support given minimal intake over the last several weeks to months. 3. Cont clear liquids today, NPO p MN tonight for procedure tomorrow. 4. This has all been discussed with the patient and her friend who is present in the room.  They understand and are agreeable to the above. 5. Dr. Donne Hazel has also discussed this with Dr. Burr Medico of oncology who is seeing the patient as well.  LOS: 1 day    Kathlyne Loud E 10/04/2014, 10:31 AM Pager: [phone]

## 2014-10-04 NOTE — Plan of Care (Signed)
Problem: Phase I Progression Outcomes Goal: Pain controlled with appropriate interventions Outcome: Progressing Goal: Initial discharge plan identified Outcome: Progressing  Problem: Phase II Progression Outcomes Goal: Progress activity as tolerated unless otherwise ordered Outcome: Progressing Goal: Discharge plan established Outcome: Progressing Goal: Vital signs remain stable Outcome: Progressing Goal: IV changed to normal saline lock Outcome: Progressing

## 2014-10-04 NOTE — Progress Notes (Signed)
Late entryCalled and left a message for [name] to find out if Allison Mitchell would be transported from [facility] to [facility] and back to [facility] post procedure 1130 a.m.

## 2014-10-04 NOTE — Consult Note (Signed)
Allison Mitchell  [phone]   HEMATOLOGY ONCOLOGY CONSULTATION   Allison Mitchell  DOB: 07/19/1961  MR#: [id number]  CSN#: [id number]    Requesting Physician: [name]    Patient Care Team: [name], MD as PCP - General (Family Medicine)   Reason for Consult: Locally Advanced Small Bowel Cancer  History of present illness: 53 y.o.  female asked to see in consultation for evaluation of colon cancer. The patient had been experiencing these symptoms since last October 2014 at which time she was diagnosed with ischemic colitis. Due to guaiac-positive stools, patient had undergone an outpatient colonoscopy on 09/26/2014, suspicious for malignancy. She was to follow-up these results with her GI physician, however, due to worsening symptoms, including inability to pass flatus for 2 days and bilious emesis on the day of admission, She was hospitalized for further evaluation.  A CT of the abdomen and pelvis with contrast on 10/02/2014 was remarkable for soft tissue/bowel wall thickening of the right lower pelvis center at the cecum-terminal ileum involving a portion of the adjacent sigmoid colon. This causes small bowel obstruction and obstructed the distal right ureter causing moderate to severe right hydroureteronephrosis. She was transferred from [facility] to [facility] for surgical management and further testing. In the interim, the results of her colonoscopy on 09/26/2014 became available. Biopsies from the sigmoid colon, ascending colon and terminal ileum are consistent with Invasive, moderately differentiated adenocarcinoma. MSI testing is currently pending (case number [facility] [id number], Dr. Winifred Olive) No tumor markers were drawn to date. CT of the chest with contrast on 10/03/2014 negative for metastatic disease. MRI of the liver without and without contrast on 10/03/2014 is remarkable for small hepatic cysts, but no findings suspicious for hepatic  metastatic disease. Right-sided hydroureteronephrosis was once again seen, with early small bowel obstruction bowel gas pattern. She may undergo right hemicolectomy with primary anastomosis with sigmoid colectomy with end colostomy on Friday, Dec 5, after cystoscopy is performed on 12/4  to rule out GU invasion and possible ureteral stent placement.  She has family history of colon cancer in her grandmother and polyps in her sister. Denies risk factors for HIV or hepatitis. Denies Diabetes. No Tobacco. No ETOH. Denies prior radiation. SHe did consume significant amount of red meat until recently. We are asked to see the patient in consultation, with recommendations on the oncology standpoint  Past medical history:      Past Medical History  Diagnosis Date  . GERD (gastroesophageal reflux disease)   . Depression        Hyperlipidemia      History of Ulcerative Colitis      Chronic Anemia  Past surgical history: (Obtained from [facility]) Procedure Laterality Date  . Sinus surgery 1987  . Pr upper gi endoscopy,biopsy N/A 06/10/2013  Procedure: UGI ENDOSCOPY; WITH BIOPSY, SINGLE OR MULTIPLE; Surgeon: Thelma Comp, MD; Location: [facility]; Service: Gastroenterology  . Pr colonoscopy w/biopsy single/multiple 06/10/2013  Procedure: COLONOSCOPY, FLEXIBLE, PROXIMAL TO SPLENIC FLEXURE; WITH BIOPSY, SINGLE OR MULTIPLE; Surgeon: Thelma Comp, MD; Location: [facility]; Service: Gastroenterology  . Pr colsc flx w/rmvl of tumor polyp lesion snare tq Left 06/10/2013  Procedure: COLONOSCOPY FLEX; W/REMOV TUMOR/LES BY SNARE; Surgeon: Thelma Comp, MD; Location[facility]; Service: Gastroenterology   Medications:  Prior to Admission:  Prescriptions prior to admission  Medication Sig Dispense Refill Last Dose  . citalopram (CELEXA) 20 MG tablet Take 40 mg by  mouth daily.  1 10/01/2014  . omeprazole (PRILOSEC) 40 MG capsule Take 40 mg by mouth every  morning.  12 10/01/2014    GEX:[id number] **OR** acetaminophen, diphenhydrAMINE **OR** diphenhydrAMINE, HYDROmorphone (DILAUDID) injection, ondansetron, zolpidem  Allergies: No Known Allergies  Family history:  Mother has thyroid disease,father has heart disease and hypertension. She has one sister with colonic polyps and one maternal grandmother with colorectal cancer                                Social history: Patient is married, 2 children in good health. 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 The patient never smoked although she did chewed tobacco in the remote past, quitting in 1996. She denies any alcohol or drug use. Loves in [place]. Very active in her community.   ROS: Constitutional: Denies fevers, chills or abnormal night sweats Eyes: Denies blurriness of vision, double vision or watery eyes Ears, nose, mouth, throat, and face: Denies mucositis or sore throat Respiratory: Denies cough, dyspnea or wheezes Cardiovascular: Denies palpitation, chest discomfort or lower extremity swelling Gastrointestinal:  Remarkable for abdominal pain and vomiting and constipation as per history of present illness. The symptoms had initially been present since late October 2014, and were sent at the time of admission. The patient was constipated 4 days prior to admission and was not able to pass gas for 2 days prior to it. Her abdominal pain was severe on presentation, now better controlled with meds. Her nausea is controlled as well. Skin: Denies abnormal skin rashes Lymphatics: Denies new lymphadenopathy or easy bruising Neurological:Denies numbness, tingling or new weaknesses Musculoskeletal:  The patient has chronic back pain Behavioral/Psych: Mood is stable, no new changes. She has a history of anxiety and depression All other systems were reviewed with the patient and are negative.   Physical Exam    ECOG PERFORMANCE STATUS: 1   Filed Vitals:    10/04/14 0547  BP: 130/70  Pulse: 72  Temp: 98 F (36.7 C)  Resp: 18   Filed Weights   10/02/14 2300  Weight: 227 lb (102.967 kg)    GENERAL:alert, no distress and comfortable SKIN: skin color, texture, turgor are normal, no rashes or significant lesions EYES: normal, conjunctiva are pink and non-injected, sclera clear OROPHARYNX:no exudate, no erythema and lips, buccal mucosa, and tongue normal  NECK: supple, thyroid normal size, non-tender, without nodularity LYMPH:  no palpable lymphadenopathy in the cervical, axillary or inguinal LUNGS: clear to auscultation and percussion with normal breathing effort HEART: regular rate & rhythm and no murmurs and no lower extremity edema ABDOMEN: abdomen soft, moderately obese tender to palpation on the left to mid upper quadrant without guarding and normal bowel sounds Musculoskeletal:no cyanosis of digits and no clubbing  PSYCH: alert & oriented x 3 with fluent speech NEURO: no focal motor/sensory deficits   Lab results:       CBC  Recent Labs Lab 10/03/14 0442 10/04/14 0546  WBC 6.7 5.6  HGB 8.5* 7.9*  HCT 29.7* 27.4*  PLT 335 297  MCV 71.7* 71.9*  MCH 20.5* 20.7*  MCHC 28.6* 28.8*  RDW 18.4* 18.5*    Anemia panel:  No results for input(s): VITAMINB12, FOLATE, FERRITIN, TIBC, IRON, RETICCTPCT in the last 72 hours.   Chemistries   Recent Labs Lab 10/03/14 0442 10/04/14 0546  NA 138 140  K 3.6* 3.8  CL 102 104  CO2  21 22  GLUCOSE 132* 100*  BUN 12 10  CREATININE 0.86 0.89  CALCIUM 8.5 8.5     Studies:      Dg Abd 1 View  10/04/2014   CLINICAL DATA:  Assess for retention of contrast within the right renal collecting system, given known hydronephrosis. Subsequent encounter.  EXAM: ABDOMEN - 1 VIEW  COMPARISON:  CT of the chest performed earlier today at 6:24 p.m.  FINDINGS: Contrast is noted filling a dilated right renal collecting system, with distention of the right ureter as previously noted. Trace contrast  is suggested within the left renal collecting system. The bladder is filled with contrast.  Evaluation is mildly suboptimal due to contrast within the colon. No acute osseous abnormalities are seen.  IMPRESSION: Contrast again noted filling a dilated right renal collecting system, with diffuse distention of the right ureter. This is compatible with the patient's known right-sided hydroureteronephrosis.   Electronically Signed   By: Garald Balding M.D.   On: 10/04/2014 02:03   Ct Chest W Contrast  10/03/2014   CLINICAL DATA:  Small bowel cancer. Metastatic disease to the chest.  EXAM: CT CHEST WITH CONTRAST  TECHNIQUE: Multidetector CT imaging of the chest was performed during intravenous contrast administration.  CONTRAST:  50m OMNIPAQUE IOHEXOL 300 MG/ML  SOLN  COMPARISON:  CT 10/02/2014.  FINDINGS: Bones: No aggressive osseous lesions.  Cardiovascular: Normal 3 vessel aortic arch. Mild arch atherosclerosis. Heart grossly normal.  Lungs: Dependent atelectasis. No pulmonary nodules or findings suspicious for pulmonary metastatic disease.  Central airways: Normal.  Effusions: None.  Lymphadenopathy: None.  Esophagus: Small hiatal hernia.  Upper abdomen: Visible upper abdomen appears within normal limits. Vicarious excretion of contrast into the gallbladder.  Other: None.  IMPRESSION: 1. Negative for metastatic disease to the chest. 2. Small hiatal hernia.   Electronically Signed   By: GDereck LigasM.D.   On: 10/03/2014 18:32   Mr Liver W Wo Contrast  10/03/2014   CLINICAL DATA:  Evaluate for hepatic metastatic disease.  EXAM: MRI ABDOMEN WITHOUT AND WITH CONTRAST  TECHNIQUE: Multiplanar multisequence MR imaging of the abdomen was performed both before and after the administration of intravenous contrast.  CONTRAST:  20 cc MultiHance  COMPARISON:  CT scan 10/02/2014  FINDINGS: Lower chest:  No significant findings.  Hepatobiliary: Diffuse fatty infiltration of the liver is noted. There are a few tiny  scattered nonenhancing hepatic cyst. No worrisome hepatic lesions. No intrahepatic biliary dilatation. The gallbladder is normal. No common bile duct dilatation.  Pancreas: Normal.  Spleen: Normal.  Adrenals/Urinary Tract: The adrenal glands are normal. The left kidney is normal. The right kidney demonstrates severe hydronephrosis and is also severe right hydroureter down to an obstructing inflammatory "Mass" in the right upper pelvis.  Stomach/Bowel: Dilated small bowel loops consistent with a small bowel obstruction. The colon is also distended with stool. The right lower quadrant/upper pelvic process is not covered on this examination except for the coronal images. It looks similar to a prior study from 2014 and is most likely an acute on chronic inflammatory process such as Crohn's disease or other inflammatory bowel disease.  Vascular/Lymphatic: No mesenteric or retroperitoneal mass or adenopathy. The aorta and branch vessels are patent. The major venous structures are patent.  Other: No ascites or abdominal wall hernia.  Musculoskeletal: No significant bony findings.  IMPRESSION: 1. Small hepatic cysts but no findings suspicious for hepatic metastatic disease. 2. Right-sided hydroureteronephrosis as demonstrated on the CT scan due to an  obstructing probable acute on chronic inflammatory bowel process in the right lower quadrant/ right upper pelvis. 3. Early small bowel obstruction bowel gas pattern.   Electronically Signed   By: Kalman Jewels M.D.   On: 10/03/2014 19:01    10/02/2014 CLINICAL DATA: Chronic abdominal pain. Nausea. History of Crohn's disease. EXAM: ACUTE ABDOMEN SERIES (ABDOMEN 2 VIEW & CHEST 1 VIEW) COMPARISON: CT the chest, abdomen pelvis - 06/08/2013; chest radiograph -08/22/2008 FINDINGS: Grossly unchanged borderline enlarged cardiac silhouette and mediastinal contours with slight differences attributable to slightly decreased lung volumes and AP projection. Bibasilar opacities favored  to represent atelectasis. No discrete focal airspace opacities. No pleural effusion or pneumothorax. No evidence of edema. There is moderate gaseous distention of several loops of small bowel with index loop within the left mid abdomen measuring approximately 4.3 cm in diameter. This finding is associated with a conspicuous paucity of distal colonic gas. No pneumoperitoneum, pneumatosis or portal venous gas. No definite abnormal intra-abdominal calcifications. Mild scoliotic curvature of the thoracolumbar spine, possibly positional. IMPRESSION: 1. Findings worrisome for small bowel obstruction. Further evaluation could be performed with abdominal CT as clinically indicated. 2. No acute cardiopulmonary disease. Electronically Signed By: Sandi Mariscal M.D. On: 10/02/2014 17:02   CT ABDOMEN AND PELVIS WITH CONTRAST    FINDINGS:  Conglomerate soft tissue/bowel wall thickening in the right lowerpelvis is centered at the cecum/terminal ileum, but involves portion of the adjacent distal sigmoid colon. There is distended small bowelextending to this area consistent with a small bowel obstruction.  There is no evidence of pneumoperitoneum or abscess.  Moderate to severe right hydroureteronephrosis is identified causedby the right pelvic process.  A small amount of free fluid within the pelvis is noted.  The liver, gallbladder, pancreas, adrenal glands and left kidney are unremarkable.No enlarged lymph nodes, biliary dilatation or abdominal aorticaneurysm noted.  The bladder, uterus and adnexal regions are unremarkable.No acute or suspicious bony abnormalities are identified.    IMPRESSION:  Soft tissue/bowel wall thickening of the right lower pelvis centered at the cecum/terminal ileum involving a portion of the adjacent sigmoid colon. This process causes small bowel obstruction and also obstructs the distal right ureter causing moderate to severe right hydroureteronephrosis. This may  represent an inflammatory process such as Crohn's disease, but neoplasm is difficult to entirely exclude.No evidence of abscess or pneumoperitoneum.      Electronically Signed  ByMikle Bosworth M.D.  On: 10/02/2014 20:11   Assessment/Plan:53 y.o. 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 We need to have this report faxed soon as becomes available MRI and CT of the chest on 10/02/2014 are negative for metastatic disease Will check her CEA preoperatively. The patient is likely to undergo surgery, for resection of bowel, possible ureteral reconstruction after cystoscopy by Urology on 12/3.  Plans for therapy depends on the findings of this cystoscopy, rule out any metastatic invasion.  Once she undergoes bowel resection, will await for the staging path report. Of note, she has family history of colon cancer, genetic testing may be required as well, such as KRAS mutation. The patient lives in [place]. She is considering continuing her care at our [place] office for geographical convenience.  Thank you for allowing [place] the opportunity to dissipate in the care of this nice patient  Right hydronephrosis, likely secondary to  malignant obstruction Per chart notes, these hydronephrosis appears to be chronic with preserved right renal parenchyma per imaging studies. Cystogram, with right retrograde diagnostic ureteroscopy and stent is recommended if the patient is to undergo bowel resection Appreciate urology involvement  Anemia Of chronic disease, blood loss,dilution and decreased oral intake  Transfuse for hemoglobin of 7 or below or if the patient is symptomatic.  Full code   Rondel Jumbo, PA-C 10/04/2014  Addendum I have seen the patient, examined her.  I agree with the assessment and and plan and have edited the notes.   Her outside colonoscopy showed congested, inflamed and ulcerated mucosa in the terminal ileum, and a 1.5 cm mass in the ascending colon and additional 1.5 cm mass in the mid sigmoid  colon. All biopsy from the above 3 sites came back positive for invasive moderate differentiated  adenocarcinoma, and the sigmoid mass containing squamous cell differentiation. So she has multifocal terminal ileum and colon cancers. The terminal ileum mass is likely locally advanced, possible invading surrounding structures.  She is scheduled to have cystoscopy tomorrow to ruled out tumor invasion. If her tumors are still resectable, I would recommend surgery first. she likely need complete colonectomy, and the possible reconstruction of urinary tract. Based on her surgical staging, she is likely need adjuvant chemotherapy. If her tumor is unresectable, I will consider chemotherapy first and agreed to evaluate surgical resection after chemotherapy treatment.   She likely would need chemotherapy, I recommend her to have a Mediport placement during her surgery.   I would attest her the tumor for MSI. If her tumor is not resectable, we'll test RAS panel.   I given her my contact information. She will decide if she wants to follow-up with me or at our [facility] office after hospital discharge.  Truitt Merle  10/04/2014

## 2014-10-05 ENCOUNTER — Ambulatory Visit (HOSPITAL_COMMUNITY): Admission: RE | Admit: 2014-10-05 | Payer: BC Managed Care – PPO | Source: Ambulatory Visit | Admitting: Urology

## 2014-10-05 ENCOUNTER — Inpatient Hospital Stay (HOSPITAL_COMMUNITY): Payer: BC Managed Care – PPO | Admitting: Anesthesiology

## 2014-10-05 ENCOUNTER — Encounter (HOSPITAL_COMMUNITY)
Admission: AD | Disposition: A | Discharge status: Home / Self Care | Payer: Self-pay | Source: Other Acute Inpatient Hospital

## 2014-10-05 ENCOUNTER — Encounter (HOSPITAL_COMMUNITY): Payer: Self-pay

## 2014-10-05 HISTORY — PX: CYSTOSCOPY WITH RETROGRADE PYELOGRAM, URETEROSCOPY AND STENT PLACEMENT: SHX5789

## 2014-10-05 LAB — SURGICAL PCR SCREEN
MRSA, PCR: POSITIVE — AB
STAPHYLOCOCCUS AUREUS: POSITIVE — AB

## 2014-10-05 LAB — CEA: CEA: 1 ng/mL (ref 0.0–5.0)

## 2014-10-05 SURGERY — CYSTOURETEROSCOPY, WITH RETROGRADE PYELOGRAM AND STENT INSERTION
Anesthesia: General | Site: Bladder | Laterality: Right

## 2014-10-05 MED ORDER — PROPOFOL 10 MG/ML IV BOLUS
INTRAVENOUS | Status: DC | PRN
  Administered 2014-10-05: 200 mg via INTRAVENOUS

## 2014-10-05 MED ORDER — DEXAMETHASONE SODIUM PHOSPHATE 10 MG/ML IJ SOLN
INTRAMUSCULAR | Status: DC | PRN
  Administered 2014-10-05: 10 mg via INTRAVENOUS

## 2014-10-05 MED ORDER — MUPIROCIN 2 % EX OINT
1.0000 "application " | TOPICAL_OINTMENT | Freq: Two times a day (BID) | CUTANEOUS | Status: AC
  Administered 2014-10-05 – 2014-10-09 (×10): 1 via NASAL
  Filled 2014-10-05 (×2): qty 22

## 2014-10-05 MED ORDER — IOHEXOL 300 MG/ML  SOLN
INTRAMUSCULAR | Status: DC | PRN
  Administered 2014-10-05: 10 mL via URETHRAL

## 2014-10-05 MED ORDER — ENSURE COMPLETE PO LIQD
237.0000 mL | Freq: Two times a day (BID) | ORAL | Status: DC
  Administered 2014-10-05 – 2014-10-08 (×6): 237 mL via ORAL

## 2014-10-05 MED ORDER — MIDAZOLAM HCL 5 MG/5ML IJ SOLN
INTRAMUSCULAR | Status: DC | PRN
  Administered 2014-10-05: 2 mg via INTRAVENOUS

## 2014-10-05 MED ORDER — FENTANYL CITRATE 0.05 MG/ML IJ SOLN
INTRAMUSCULAR | Status: DC | PRN
  Administered 2014-10-05: 50 ug via INTRAVENOUS
  Administered 2014-10-05 (×2): 25 ug via INTRAVENOUS

## 2014-10-05 MED ORDER — CHLORHEXIDINE GLUCONATE CLOTH 2 % EX PADS
6.0000 | MEDICATED_PAD | Freq: Every day | CUTANEOUS | Status: AC
  Administered 2014-10-05 – 2014-10-09 (×5): 6 via TOPICAL

## 2014-10-05 MED ORDER — LACTATED RINGERS IV SOLN
INTRAVENOUS | Status: DC
  Administered 2014-10-05: 14:00:00 via INTRAVENOUS
  Administered 2014-10-05: 1000 mL via INTRAVENOUS

## 2014-10-05 MED ORDER — ONDANSETRON HCL 4 MG/2ML IJ SOLN
INTRAMUSCULAR | Status: DC | PRN
  Administered 2014-10-05: 4 mg via INTRAVENOUS

## 2014-10-05 MED ORDER — LACTATED RINGERS IV SOLN: INTRAVENOUS | Status: DC

## 2014-10-05 MED ORDER — LIDOCAINE HCL (CARDIAC) 20 MG/ML IV SOLN
INTRAVENOUS | Status: DC | PRN
  Administered 2014-10-05: 100 mg via INTRAVENOUS

## 2014-10-05 MED ORDER — FENTANYL CITRATE 0.05 MG/ML IJ SOLN: 25.0000 ug | INTRAMUSCULAR | Status: DC | PRN

## 2014-10-05 SURGICAL SUPPLY — 25 items
BAG URINE DRAINAGE (UROLOGICAL SUPPLIES) ×3 IMPLANT
BASKET LASER NITINOL 1.9FR (BASKET) IMPLANT
BASKET STNLS GEMINI 4WIRE 3FR (BASKET) IMPLANT
BASKET ZERO TIP NITINOL 2.4FR (BASKET) IMPLANT
CATH INTERMIT  6FR 70CM (CATHETERS) ×3 IMPLANT
CLOTH BEACON ORANGE TIMEOUT ST (SAFETY) IMPLANT
DRAPE CAMERA CLOSED 9X96 (DRAPES) ×3 IMPLANT
ELECT REM PT RETURN 9FT ADLT (ELECTROSURGICAL)
ELECTRODE REM PT RTRN 9FT ADLT (ELECTROSURGICAL) IMPLANT
FIBER LASER FLEXIVA 200 (UROLOGICAL SUPPLIES) IMPLANT
FIBER LASER FLEXIVA 365 (UROLOGICAL SUPPLIES) IMPLANT
GLOVE BIOGEL M STRL SZ7.5 (GLOVE) ×3 IMPLANT
GLOVE BIOGEL PI IND STRL 8 (GLOVE) ×1 IMPLANT
GLOVE BIOGEL PI INDICATOR 8 (GLOVE) ×2
GLOVE ECLIPSE 7.5 STRL STRAW (GLOVE) ×3 IMPLANT
GOWN STRL REIN 3XL XLG LVL4 (GOWN DISPOSABLE) ×3 IMPLANT
GOWN STRL REUS W/TWL LRG LVL3 (GOWN DISPOSABLE) ×3 IMPLANT
GUIDEWIRE ANG ZIPWIRE 038X150 (WIRE) ×3 IMPLANT
GUIDEWIRE STR DUAL SENSOR (WIRE) ×3 IMPLANT
IV NS IRRIG 3000ML ARTHROMATIC (IV SOLUTION) ×3 IMPLANT
PACK CYSTO (CUSTOM PROCEDURE TRAY) ×3 IMPLANT
STENT CONTOUR 7FRX24 (STENTS) ×3 IMPLANT
SYRINGE 10CC LL (SYRINGE) IMPLANT
SYRINGE IRR TOOMEY STRL 70CC (SYRINGE) IMPLANT
TUBE FEEDING 8FR 16IN STR KANG (MISCELLANEOUS) IMPLANT

## 2014-10-05 NOTE — Brief Op Note (Signed)
10/03/2014 - 10/05/2014  1:29 PM  PATIENT:  Kaleen Mask  53 y.o. female  PRE-OPERATIVE DIAGNOSIS:  RIGHT HYDRONEPHROSIS, PELVIC MASS  POST-OPERATIVE DIAGNOSIS:  RIGHT HYDRONEPHROSIS, PELVIC MASS  PROCEDURE:  Procedure(s): CYSTOSCOPY WITH RETROGRADE PYELOGRAM, URETEROSCOPY  AND STENT PLACEMENT (Right)  SURGEON:  Surgeon(s) and Role:    * Alexis Frock, MD - Primary  PHYSICIAN ASSISTANT:   ASSISTANTS: none   ANESTHESIA:   general  EBL:     BLOOD ADMINISTERED:none  DRAINS: none   LOCAL MEDICATIONS USED:  NONE  SPECIMEN:  No Specimen  DISPOSITION OF SPECIMEN:  N/A  COUNTS:  YES  TOURNIQUET:  * No tourniquets in log *  DICTATION: .Other Dictation: Dictation Number [id number]  PLAN OF CARE: Admit to inpatient   PATIENT DISPOSITION:  PACU - hemodynamically stable.   Delay start of Pharmacological VTE agent (>24hrs) due to surgical blood loss or risk of bleeding: yes

## 2014-10-05 NOTE — Transfer of Care (Signed)
Immediate Anesthesia Transfer of Care Note  Patient: Allison Mitchell  Procedure(s) Performed: Procedure(s): CYSTOSCOPY WITH RETROGRADE PYELOGRAM, URETEROSCOPY  AND STENT PLACEMENT (Right)  Patient Location: PACU  Anesthesia Type:General  Level of Consciousness: awake, alert , oriented and patient cooperative  Airway & Oxygen Therapy: Patient Spontanous Breathing and Patient connected to face mask oxygen  Post-op Assessment: Report given to PACU RN, Post -op Vital signs reviewed and stable and Patient moving all extremities  Post vital signs: Reviewed and stable  Complications: No apparent anesthesia complications

## 2014-10-05 NOTE — Addendum Note (Signed)
Addendum  created 10/05/14 1409 by Peyton Najjar, MD   Modules edited: Orders, PRL Based Order Sets

## 2014-10-05 NOTE — Progress Notes (Signed)
Patient ID: Allison Mitchell, female   DOB: [birth year], 53 y.o.   MRN: [id number] Day of Surgery  Subjective: Pt without complaints.  Tolerating clears.  Some flatus, but no BM still.  Pain is improved  Objective: Vital signs in last 24 hours: Temp:  [98.1 F (36.7 C)-98.8 F (37.1 C)] 98.1 F (36.7 C) (12/03 0621) Pulse Rate:  [76-81] 77 (12/03 0621) Resp:  [18-20] 18 (12/03 0621) BP: (133-139)/(64-73) 133/64 mmHg (12/03 0621) SpO2:  [98 %-100 %] 98 % (12/03 0621) Last BM Date: 09/30/14  Intake/Output from previous day: 12/02 0701 - 12/03 0700 In: 4990 [P.O.:240; I.V.:3050; IV Piggyback:1700] Out: 5700 [Urine:5700] Intake/Output this shift:    PE: Abd: soft, NT, NS, +BS  Lab Results:   Recent Labs  10/03/14 0442 10/04/14 0546  WBC 6.7 5.6  HGB 8.5* 7.9*  HCT 29.7* 27.4*  PLT 335 297   BMET  Recent Labs  10/03/14 0442 10/04/14 0546  NA 138 140  K 3.6* 3.8  CL 102 104  CO2 21 22  GLUCOSE 132* 100*  BUN 12 10  CREATININE 0.86 0.89  CALCIUM 8.5 8.5   PT/INR No results for input(s): LABPROT, INR in the last 72 hours. CMP     Component Value Date/Time   NA 140 10/04/2014 0546   K 3.8 10/04/2014 0546   CL 104 10/04/2014 0546   CO2 22 10/04/2014 0546   GLUCOSE 100* 10/04/2014 0546   BUN 10 10/04/2014 0546   CREATININE 0.89 10/04/2014 0546   CALCIUM 8.5 10/04/2014 0546   GFRNONAA 73* 10/04/2014 0546   GFRAA 84* 10/04/2014 0546   Lipase  No results found for: LIPASE     Studies/Results: Dg Chest 2 View  10/04/2014   CLINICAL DATA:  Shortness of breath.  Pleural effusion.  EXAM: CHEST  2 VIEW  COMPARISON:  CT chest 12/01/ 2015.  Chest x-ray 08/22/2008.  FINDINGS: Mediastinum and hilar structures normal. Mild left basilar atelectasis. No pleural effusion or pneumothorax. No acute osseous abnormality.  IMPRESSION: Mild left basilar atelectasis, no acute cardiopulmonary disease otherwise noted.   Electronically Signed   By: Marcello Moores  Register   On: 10/04/2014  08:01   Dg Abd 1 View  10/04/2014   CLINICAL DATA:  Assess for retention of contrast within the right renal collecting system, given known hydronephrosis. Subsequent encounter.  EXAM: ABDOMEN - 1 VIEW  COMPARISON:  CT of the chest performed earlier today at 6:24 p.m.  FINDINGS: Contrast is noted filling a dilated right renal collecting system, with distention of the right ureter as previously noted. Trace contrast is suggested within the left renal collecting system. The bladder is filled with contrast.  Evaluation is mildly suboptimal due to contrast within the colon. No acute osseous abnormalities are seen.  IMPRESSION: Contrast again noted filling a dilated right renal collecting system, with diffuse distention of the right ureter. This is compatible with the patient's known right-sided hydroureteronephrosis.   Electronically Signed   By: Garald Balding M.D.   On: 10/04/2014 02:03   Ct Chest W Contrast  10/03/2014   CLINICAL DATA:  Small bowel cancer. Metastatic disease to the chest.  EXAM: CT CHEST WITH CONTRAST  TECHNIQUE: Multidetector CT imaging of the chest was performed during intravenous contrast administration.  CONTRAST:  43mL OMNIPAQUE IOHEXOL 300 MG/ML  SOLN  COMPARISON:  CT 10/02/2014.  FINDINGS: Bones: No aggressive osseous lesions.  Cardiovascular: Normal 3 vessel aortic arch. Mild arch atherosclerosis. Heart grossly normal.  Lungs: Dependent atelectasis. No pulmonary  nodules or findings suspicious for pulmonary metastatic disease.  Central airways: Normal.  Effusions: None.  Lymphadenopathy: None.  Esophagus: Small hiatal hernia.  Upper abdomen: Visible upper abdomen appears within normal limits. Vicarious excretion of contrast into the gallbladder.  Other: None.  IMPRESSION: 1. Negative for metastatic disease to the chest. 2. Small hiatal hernia.   Electronically Signed   By: Dereck Ligas M.D.   On: 10/03/2014 18:32   Mr Liver W Wo Contrast  10/03/2014   CLINICAL DATA:  Evaluate for  hepatic metastatic disease.  EXAM: MRI ABDOMEN WITHOUT AND WITH CONTRAST  TECHNIQUE: Multiplanar multisequence MR imaging of the abdomen was performed both before and after the administration of intravenous contrast.  CONTRAST:  20 cc MultiHance  COMPARISON:  CT scan 10/02/2014  FINDINGS: Lower chest:  No significant findings.  Hepatobiliary: Diffuse fatty infiltration of the liver is noted. There are a few tiny scattered nonenhancing hepatic cyst. No worrisome hepatic lesions. No intrahepatic biliary dilatation. The gallbladder is normal. No common bile duct dilatation.  Pancreas: Normal.  Spleen: Normal.  Adrenals/Urinary Tract: The adrenal glands are normal. The left kidney is normal. The right kidney demonstrates severe hydronephrosis and is also severe right hydroureter down to an obstructing inflammatory "Mass" in the right upper pelvis.  Stomach/Bowel: Dilated small bowel loops consistent with a small bowel obstruction. The colon is also distended with stool. The right lower quadrant/upper pelvic process is not covered on this examination except for the coronal images. It looks similar to a prior study from 2014 and is most likely an acute on chronic inflammatory process such as Crohn's disease or other inflammatory bowel disease.  Vascular/Lymphatic: No mesenteric or retroperitoneal mass or adenopathy. The aorta and branch vessels are patent. The major venous structures are patent.  Other: No ascites or abdominal wall hernia.  Musculoskeletal: No significant bony findings.  IMPRESSION: 1. Small hepatic cysts but no findings suspicious for hepatic metastatic disease. 2. Right-sided hydroureteronephrosis as demonstrated on the CT scan due to an obstructing probable acute on chronic inflammatory bowel process in the right lower quadrant/ right upper pelvis. 3. Early small bowel obstruction bowel gas pattern.   Electronically Signed   By: Kalman Jewels M.D.   On: 10/03/2014 19:01     Anti-infectives: Anti-infectives    Start     Dose/Rate Route Frequency Ordered Stop   10/05/14 0700  gentamicin (GARAMYCIN) IVPB 80 mg     80 mg100 mL/hr over 30 Minutes Intravenous 30 min pre-op 10/04/14 1103     10/03/14 0200  ciprofloxacin (CIPRO) IVPB 400 mg     400 mg200 mL/hr over 60 Minutes Intravenous Every 12 hours 10/03/14 0055     10/03/14 0200  metroNIDAZOLE (FLAGYL) IVPB 500 mg     500 mg100 mL/hr over 60 Minutes Intravenous Every 8 hours 10/03/14 0055         Assessment/Plan   1. 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 Right ureteral obstruction secondary to #1, ? Direct invasion vs extrinsic compression 3. PSBO secondary to #1  Plan: 1. Going to [facility] today for cysto/uretero/stent placement.  This will allow [place] to better determine surgical plans. 2. Can resume clear liquids today after procedure 3. prealb is 20.  Hold off on TNA 4. Plan for surgical intervention when this is able to be organized and set up.  LOS: 2 days    Sherleen Pangborn E 10/05/2014, 10:01 AM Pager: [phone]

## 2014-10-05 NOTE — Anesthesia Postprocedure Evaluation (Signed)
  Anesthesia Post-op Note  Patient: Allison Mitchell  Procedure(s) Performed: Procedure(s) (LRB): CYSTOSCOPY WITH RETROGRADE PYELOGRAM, URETEROSCOPY  AND STENT PLACEMENT (Right)  Patient Location: PACU  Anesthesia Type: General  Level of Consciousness: awake and alert   Airway and Oxygen Therapy: Patient Spontanous Breathing  Post-op Pain: mild  Post-op Assessment: Post-op Vital signs reviewed, Patient's Cardiovascular Status Stable, Respiratory Function Stable, Patent Airway and No signs of Nausea or vomiting  Last Vitals:  Filed Vitals:   10/05/14 1341  BP: 122/70  Pulse: 76  Temp: 36.3 C  Resp: 12    Post-op Vital Signs: stable   Complications: No apparent anesthesia complications

## 2014-10-05 NOTE — Progress Notes (Signed)
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 Cr <1. No contralateral lesions. Hydro appears quite chronic, but preserved rt renal parenchyma. Unclear if purely extrinsic v. Any direct ureteral invasion by imaging alone. Primary team planning on local bowel resection in neat future.   PMH unremarkable. No CV disease / blood thinners. No prior abd surgery.  Today "Allison Mitchell" is seen to proceed with cysto, right retrograde, right ureteroscopy / possible biopsy / stent to further characterize potential right ureteral involvment with her pelvic malignancy. No interval fevers.   Objective: Vital signs in last 24 hours: Temp:  [98.1 F (36.7 C)-98.8 F (37.1 C)] 98.1 F (36.7 C) (12/03 0621) Pulse Rate:  [76-81] 77 (12/03 0621) Resp:  [18-20] 18 (12/03 0621) BP: (133-139)/(64-73) 133/64 mmHg (12/03 0621) SpO2:  [98 %-100 %] 98 % (12/03 0621) Last BM Date: 09/30/14  Intake/Output from previous day: 12/02 0701 - 12/03 0700 In: 4990 [P.O.:240; I.V.:3050; IV Piggyback:1700] Out: 5700 [Urine:5700] Intake/Output this shift:    General appearance: alert, cooperative and appears stated age Eyes: negative Nose: Nares normal. Septum midline. Mucosa normal. No drainage or sinus tenderness. Throat: lips, mucosa, and tongue normal; teeth and gums normal Neck: supple, symmetrical, trachea midline Back: symmetric, no curvature. ROM normal. No CVA tenderness. Resp: non-labored on room air Cardio: Nl rate GI: soft, non-tender; bowel sounds normal; no masses,  no organomegaly Extremities: extremities normal, atraumatic, no cyanosis or edema Pulses: 2+ and symmetric Skin: Skin color, texture, turgor normal. No rashes or lesions Lymph nodes: Cervical, supraclavicular, and axillary nodes normal. Neurologic: Grossly normal  Lab  Results:   Recent Labs  10/03/14 0442 10/04/14 0546  WBC 6.7 5.6  HGB 8.5* 7.9*  HCT 29.7* 27.4*  PLT 335 297   BMET  Recent Labs  10/03/14 0442 10/04/14 0546  NA 138 140  K 3.6* 3.8  CL 102 104  CO2 21 22  GLUCOSE 132* 100*  BUN 12 10  CREATININE 0.86 0.89  CALCIUM 8.5 8.5   PT/INR No results for input(s): LABPROT, INR in the last 72 hours. ABG No results for input(s): PHART, HCO3 in the last 72 hours.  Invalid input(s): PCO2, PO2  Studies/Results: Dg Chest 2 View  10/04/2014   CLINICAL DATA:  Shortness of breath.  Pleural effusion.  EXAM: CHEST  2 VIEW  COMPARISON:  CT chest 12/01/ 2015.  Chest x-ray 08/22/2008.  FINDINGS: Mediastinum and hilar structures normal. Mild left basilar atelectasis. No pleural effusion or pneumothorax. No acute osseous abnormality.  IMPRESSION: Mild left basilar atelectasis, no acute cardiopulmonary disease otherwise noted.   Electronically Signed   By: Marcello Moores  Register   On: 10/04/2014 08:01   Dg Abd 1 View  10/04/2014   CLINICAL DATA:  Assess for retention of contrast within the right renal collecting system, given known hydronephrosis. Subsequent encounter.  EXAM: ABDOMEN - 1 VIEW  COMPARISON:  CT of the chest performed earlier today at 6:24 p.m.  FINDINGS: Contrast is noted filling a dilated right renal collecting system, with distention of the right ureter as previously noted. Trace contrast is suggested within the left renal collecting system. The bladder is filled with contrast.  Evaluation is mildly suboptimal due to contrast within the colon. No acute osseous abnormalities are seen.  IMPRESSION: Contrast again noted filling a dilated right renal collecting system,  with diffuse distention of the right ureter. This is compatible with the patient's known right-sided hydroureteronephrosis.   Electronically Signed   By: Garald Balding M.D.   On: 10/04/2014 02:03   Ct Chest W Contrast  10/03/2014   CLINICAL DATA:  Small bowel cancer.  Metastatic disease to the chest.  EXAM: CT CHEST WITH CONTRAST  TECHNIQUE: Multidetector CT imaging of the chest was performed during intravenous contrast administration.  CONTRAST:  63mL OMNIPAQUE IOHEXOL 300 MG/ML  SOLN  COMPARISON:  CT 10/02/2014.  FINDINGS: Bones: No aggressive osseous lesions.  Cardiovascular: Normal 3 vessel aortic arch. Mild arch atherosclerosis. Heart grossly normal.  Lungs: Dependent atelectasis. No pulmonary nodules or findings suspicious for pulmonary metastatic disease.  Central airways: Normal.  Effusions: None.  Lymphadenopathy: None.  Esophagus: Small hiatal hernia.  Upper abdomen: Visible upper abdomen appears within normal limits. Vicarious excretion of contrast into the gallbladder.  Other: None.  IMPRESSION: 1. Negative for metastatic disease to the chest. 2. Small hiatal hernia.   Electronically Signed   By: Dereck Ligas M.D.   On: 10/03/2014 18:32   Mr Liver W Wo Contrast  10/03/2014   CLINICAL DATA:  Evaluate for hepatic metastatic disease.  EXAM: MRI ABDOMEN WITHOUT AND WITH CONTRAST  TECHNIQUE: Multiplanar multisequence MR imaging of the abdomen was performed both before and after the administration of intravenous contrast.  CONTRAST:  20 cc MultiHance  COMPARISON:  CT scan 10/02/2014  FINDINGS: Lower chest:  No significant findings.  Hepatobiliary: Diffuse fatty infiltration of the liver is noted. There are a few tiny scattered nonenhancing hepatic cyst. No worrisome hepatic lesions. No intrahepatic biliary dilatation. The gallbladder is normal. No common bile duct dilatation.  Pancreas: Normal.  Spleen: Normal.  Adrenals/Urinary Tract: The adrenal glands are normal. The left kidney is normal. The right kidney demonstrates severe hydronephrosis and is also severe right hydroureter down to an obstructing inflammatory "Mass" in the right upper pelvis.  Stomach/Bowel: Dilated small bowel loops consistent with a small bowel obstruction. The colon is also distended with  stool. The right lower quadrant/upper pelvic process is not covered on this examination except for the coronal images. It looks similar to a prior study from 2014 and is most likely an acute on chronic inflammatory process such as Crohn's disease or other inflammatory bowel disease.  Vascular/Lymphatic: No mesenteric or retroperitoneal mass or adenopathy. The aorta and branch vessels are patent. The major venous structures are patent.  Other: No ascites or abdominal wall hernia.  Musculoskeletal: No significant bony findings.  IMPRESSION: 1. Small hepatic cysts but no findings suspicious for hepatic metastatic disease. 2. Right-sided hydroureteronephrosis as demonstrated on the CT scan due to an obstructing probable acute on chronic inflammatory bowel process in the right lower quadrant/ right upper pelvis. 3. Early small bowel obstruction bowel gas pattern.   Electronically Signed   By: Kalman Jewels M.D.   On: 10/03/2014 19:01    Anti-infectives: Anti-infectives    Start     Dose/Rate Route Frequency Ordered Stop   10/05/14 0700  gentamicin (GARAMYCIN) IVPB 80 mg     80 mg100 mL/hr over 30 Minutes Intravenous 30 min pre-op 10/04/14 1103     10/03/14 0200  ciprofloxacin (CIPRO) IVPB 400 mg     400 mg200 mL/hr over 60 Minutes Intravenous Every 12 hours 10/03/14 0055     10/03/14 0200  metroNIDAZOLE (FLAGYL) IVPB 500 mg     500 mg100 mL/hr over 60 Minutes Intravenous Every 8 hours 10/03/14 0055  Assessment/Plan:   1 - Right Hydronephrosis, Likely Malignant Obstruction from GI Cancer -proceed as planned today with right ureteroscopy.  We discussed ureteroscopy  in detail.  We discussed risks including bleeding, infection, damage to kidney / ureter  bladder, rarely loss of kidney. We discussed anesthetic risks and rare but serious surgical complications including DVT, PE, MI, and mortality. We specifically addressed that in 5-10% of cases a staged approach is required with stenting followed  by re-attempt ureteroscopy if anatomy unfavorable. The patient voiced understanding and wises to proceed.   [facility], Jaidan Stachnik 10/05/2014

## 2014-10-06 ENCOUNTER — Encounter (HOSPITAL_COMMUNITY): Payer: Self-pay | Admitting: Urology

## 2014-10-06 NOTE — Progress Notes (Signed)
Patient ID: Allison Mitchell, female   DOB: 1961-07-18, 53 y.o.   MRN: [id number] 1 Day Post-Op  Subjective: No c/o except feels like she has a UTI after procedure yesterday and voiding a lot.  No abdominal pain, but no flatus in 24 hours.  Still tolerating clear liquids  Objective: Vital signs in last 24 hours: Temp:  [97.4 F (36.3 C)-98.3 F (36.8 C)] 97.9 F (36.6 C) (12/04 0531) Pulse Rate:  [68-83] 71 (12/04 0531) Resp:  [12-19] 16 (12/04 0531) BP: (109-151)/(55-80) 141/71 mmHg (12/04 0531) SpO2:  [98 %-100 %] 98 % (12/04 0531) Last BM Date: 09/30/14  Intake/Output from previous day: 12/03 0701 - 12/04 0700 In: 2827 [P.O.:600; I.V.:2227] Out: 1100 [Urine:1100] Intake/Output this shift:    PE: Abd: soft, NT, ND,   Lab Results:   Recent Labs  10/04/14 0546  WBC 5.6  HGB 7.9*  HCT 27.4*  PLT 297   BMET  Recent Labs  10/04/14 0546  NA 140  K 3.8  CL 104  CO2 22  GLUCOSE 100*  BUN 10  CREATININE 0.89  CALCIUM 8.5   PT/INR No results for input(s): LABPROT, INR in the last 72 hours. CMP     Component Value Date/Time   NA 140 10/04/2014 0546   K 3.8 10/04/2014 0546   CL 104 10/04/2014 0546   CO2 22 10/04/2014 0546   GLUCOSE 100* 10/04/2014 0546   BUN 10 10/04/2014 0546   CREATININE 0.89 10/04/2014 0546   CALCIUM 8.5 10/04/2014 0546   GFRNONAA 73* 10/04/2014 0546   GFRAA 84* 10/04/2014 0546   Lipase  No results found for: LIPASE     Studies/Results: No results found.  Anti-infectives: Anti-infectives    Start     Dose/Rate Route Frequency Ordered Stop   10/05/14 0700  gentamicin (GARAMYCIN) IVPB 80 mg     80 mg100 mL/hr over 30 Minutes Intravenous 30 min pre-op 10/04/14 1103 10/05/14 1300   10/03/14 0200  ciprofloxacin (CIPRO) IVPB 400 mg  Status:  Discontinued     400 mg200 mL/hr over 60 Minutes Intravenous Every 12 hours 10/03/14 0055 10/05/14 1416   10/03/14 0200  metroNIDAZOLE (FLAGYL) IVPB 500 mg  Status:  Discontinued     500 mg100  mL/hr over 60 Minutes Intravenous Every 8 hours 10/03/14 0055 10/05/14 1416       Assessment/Plan  1. 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 PSBO 3. Anemia, hgb 7.9 4. Right ureteral obstruction, s/p stent placement  Plan: 1.  Cont clear liquids for now given no flatus.  Add ensures BID.  If starts passing some flatus, may be able to try full liquids.  If she is passing some flatus, would like to maybe try a gently slow prep starting Sunday possibly. 2. Follow hgb.  Will need at least a type and screen, if not cross given hgb 7.9 prior to big operation.  Check labs in am 3. Surgery planned on Tuesday afternoon with Dr. Hulen Skains and Dr. Tresa Moore.  The patient is aware of this. 4. Hold off on TNA for now as prealbumin is 20.   LOS: 3 days    Yaritzi Craun E 10/06/2014, 10:13 AM Pager: [phone]

## 2014-10-06 NOTE — Op Note (Signed)
NAMEKIAH, VANALSTINE NO.:  [id number]  MEDICAL RECORD NO.:  [id number]  LOCATION:  6N10C                        FACILITY:  [facility]  PHYSICIAN:  Alexis Frock, MD     DATE OF BIRTH:  1960/12/16  DATE OF PROCEDURE:  10/05/2014 DATE OF DISCHARGE:                              OPERATIVE REPORT   DIAGNOSIS:  Malignant right hydronephrosis.  PROCEDURE: 1. Cystoscopy with right retrograde pyelogram interpretation. 2. Right diagnostic ureteroscopy. 3. Insertion of right ureteral stent 7 x 24, no tether.  FINDINGS: 1. Massive hydroureteronephrosis to the level of the mid to distal     ureter, very close to the iliac crossing.  This appeared to be     extrinsic in nature. 2. No direct intramural involvement of ureter by diagnostic     ureteroscopy. 3. Excellent placement of right ureteral stent, proximal curl in the     renal pelvis and distal curl in the urinary bladder.  INDICATION:  Ms. Robicheaux is a very pleasant, but unfortunate 53 year old lady with long history of a GI complaints who was found on workup to have likely locally advanced non-metastatic colon carcinoma involving her distal small bowel and cecum.  She also has significant right hydroureteronephrosis down to what appears to be at the level of this known malignancy, but unclear on preoperative imaging whether there was direct intramural invasion of the ureter or not.  For further diagnostic and staging purposes, it was felt that diagnostic ureteroscopy with stenting was warranted before and help to rule out direct intramural invasion and also place ureteral stent to allow further using open dissection at the time of her definitive colon surgery.  An informed consent was obtained and placed in the medical record.  PROCEDURE IN DETAIL:  The patient being Dejanae Helser verified, procedure being cysto, right retrograde, ureteroscopy, and stent was confirmed.  Procedure was carried out.  Time-out was  performed. Intravenous antibiotics were administered.  General LMA anesthesia was induced.  Patient was placed into a low lithotomy position, and sterile field was created by prepping and draping the patient's vagina, introitus, and proximal thighs using iodine x3.  Next, cystourethroscopy performed using 22-French rigid scope with 12-degree offset lens. Inspection of the urinary bladder revealed no diverticula, calcifications, papular lesions.  The right ureteral orifice was cannulated with 6-French catheter and right retrograde pyelogram was obtained.  Right retrograde demonstrated a single right ureter and single system right kidney.  There was a massive hydroureteronephrosis to the level of the mid to distal ureter, very close to iliac crossing with likely extrinsic compression based on retrograde pyelography.  A 0.038 ZIPwire was advanced at the level of renal pelvis, set aside as a safety wire. Next, semi-rigid ureteroscopy was performed of the distal two-thirds of the right ureter alongside as a separate Sensor working wire.  There was narrowing in the area aforementioned; however, no obvious direct intraluminal masses of the right ureter.  This again was also felt to be consistent with likely extrinsic compression.  It was therefore felt that the ureteral stenting was clearly warranted to aid in her next stage which will be colon resection.  As such, a new 7 x 24 contour-type stent  was placed with remaining safety wire.  Good proximal and distal deployment were noted.  Efflux of urine was seen around into the distal end of the stent.  Procedure was then terminated.  The patient tolerated the procedure well.  There were no immediate perioperative complications.  The patient was taken to the postanesthesia care unit in stable condition.          ______________________________ Alexis Frock, MD     TM/MEDQ  D:  10/05/2014  T:  10/06/2014  Job:  [id number]

## 2014-10-07 LAB — BASIC METABOLIC PANEL
ANION GAP: 14 (ref 5–15)
BUN: 13 mg/dL (ref 6–23)
CO2: 22 mEq/L (ref 19–32)
Calcium: 8.8 mg/dL (ref 8.4–10.5)
Chloride: 102 mEq/L (ref 96–112)
Creatinine, Ser: 1.11 mg/dL — ABNORMAL HIGH (ref 0.50–1.10)
GFR calc Af Amer: 65 mL/min — ABNORMAL LOW (ref >90)
GFR, EST NON AFRICAN AMERICAN: 56 mL/min — AB (ref >90)
GLUCOSE: 91 mg/dL (ref 70–99)
POTASSIUM: 4.5 meq/L (ref 3.7–5.3)
SODIUM: 138 meq/L (ref 137–147)

## 2014-10-07 LAB — CBC
HCT: 27.2 % — ABNORMAL LOW (ref 36.0–46.0)
HEMOGLOBIN: 8.1 g/dL — AB (ref 12.0–15.0)
MCH: 21.6 pg — ABNORMAL LOW (ref 26.0–34.0)
MCHC: 29.8 g/dL — AB (ref 30.0–36.0)
MCV: 72.5 fL — ABNORMAL LOW (ref 78.0–100.0)
PLATELETS: 329 10*3/uL (ref 150–400)
RBC: 3.75 MIL/uL — ABNORMAL LOW (ref 3.87–5.11)
RDW: 18.5 % — ABNORMAL HIGH (ref 11.5–15.5)
WBC: 7.7 10*3/uL (ref 4.0–10.5)

## 2014-10-07 MED ORDER — CITALOPRAM HYDROBROMIDE 40 MG PO TABS
40.0000 mg | ORAL_TABLET | Freq: Every day | ORAL | Status: DC
  Administered 2014-10-07 – 2014-10-10 (×4): 40 mg via ORAL
  Filled 2014-10-07 (×4): qty 1

## 2014-10-07 MED ORDER — SODIUM CHLORIDE 0.9 % IV SOLN
INTRAVENOUS | Status: DC
  Administered 2014-10-07 – 2014-10-08 (×3): via INTRAVENOUS
  Administered 2014-10-09: 125 mL via INTRAVENOUS
  Administered 2014-10-10 – 2014-10-13 (×7): via INTRAVENOUS
  Administered 2014-10-14: 75 mL via INTRAVENOUS
  Administered 2014-10-14 – 2014-10-17 (×4): via INTRAVENOUS

## 2014-10-07 MED ORDER — CITALOPRAM HYDROBROMIDE 20 MG PO TABS
ORAL_TABLET | ORAL | Status: AC
  Filled 2014-10-07: qty 1

## 2014-10-07 NOTE — Progress Notes (Signed)
2 Days Post-Op  Subjective: She is fine going thru [place].  No complaints, not looking forward to bowel prep pre op.  I am concerned her H/H is up some.    Objective: Vital signs in last 24 hours: Temp:  [97.9 F (36.6 C)-98.1 F (36.7 C)] 98.1 F (36.7 C) (12/05 0643) Pulse Rate:  [69-82] 69 (12/05 0643) Resp:  [16-18] 16 (12/05 0643) BP: (130-155)/(67-72) 130/67 mmHg (12/05 0643) SpO2:  [98 %-100 %] 100 % (12/05 0643) Last BM Date: 09/30/14 I/O=? Diet: clears Afebrile, VSS Creatinine up some , H/H is up some Intake/Output from previous day: 12/04 0701 - 12/05 0700 In: 786.7 [I.V.:786.7] Out: -  Intake/Output this shift:    General appearance: alert, cooperative and no distress GI: soft, non-tender; bowel sounds normal; no masses,  no organomegaly  Lab Results:   Recent Labs  10/07/14 0549  WBC 7.7  HGB 8.1*  HCT 27.2*  PLT 329    BMET  Recent Labs  10/07/14 0549  NA 138  K 4.5  CL 102  CO2 22  GLUCOSE 91  BUN 13  CREATININE 1.11*  CALCIUM 8.8   PT/INR No results for input(s): LABPROT, INR in the last 72 hours.  No results for input(s): AST, ALT, ALKPHOS, BILITOT, PROT, ALBUMIN in the last 168 hours.   Lipase  No results found for: LIPASE   Studies/Results: No results found.  Medications: . antiseptic oral rinse  7 mL Mouth Rinse BID  . Chlorhexidine Gluconate Cloth  6 each Topical Q0600  . enoxaparin (LOVENOX) injection  40 mg Subcutaneous Q24H  . feeding supplement (ENSURE COMPLETE)  237 mL Oral BID BM  . mupirocin ointment  1 application Nasal BID  . pantoprazole (PROTONIX) IV  40 mg Intravenous QHS   . dextrose 5 % and 0.45 % NaCl with KCl 10 mEq/L 100 mL/hr at 10/06/14 1352  . lactated ringers     Prior to Admission medications   Medication Sig Start Date End Date Taking? Authorizing Provider  citalopram (CELEXA) 20 MG tablet Take 40 mg by mouth daily. 09/18/14   Historical Provider, MD  omeprazole (PRILOSEC) 40 MG capsule Take 40  mg by mouth every morning. 09/18/14   Historical Provider, MD     Assessment/Plan . 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 PSBO 3. Anemia, hgb 7.9 4. Right ureteral obstruction, s/p stent placement   Plan:  I put her back on her Celexa, and I am going to change her IV and see how she does, check labs again tomorrow.    LOS: 4 days    Megann Easterwood 10/07/2014

## 2014-10-07 NOTE — Progress Notes (Signed)
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 Cr <1. No contralateral lesions. Hydro appears quite chronic, but preserved rt renal parenchyma. Diagnostic Ureteroscopy 12/3 confirms predominanly extrinsic compression and 54F ureteral stent placed. Planning on combined Gen Surg - Urol colon resection, ureterolysis, possible ureteral reconstruction 10/10/14.  Today "Allison Mitchell" is w/o complaints. Tollerating clears, no recent BM.   Objective: Vital signs in last 24 hours: Temp:  [97.9 F (36.6 C)-98.1 F (36.7 C)] 98.1 F (36.7 C) (12/05 0643) Pulse Rate:  [69-82] 69 (12/05 0643) Resp:  [16-18] 16 (12/05 0643) BP: (130-155)/(67-72) 130/67 mmHg (12/05 0643) SpO2:  [98 %-100 %] 100 % (12/05 0643) Last BM Date: 09/30/14  Intake/Output from previous day: 12/04 0701 - 12/05 0700 In: 786.7 [I.V.:786.7] Out: -  Intake/Output this shift:    General appearance: alert, cooperative and appears stated age Nose: Nares normal. Septum midline. Mucosa normal. No drainage or sinus tenderness. Throat: lips, mucosa, and tongue normal; teeth and gums normal Neck: supple, symmetrical, trachea midline Back: symmetric, no curvature. ROM normal. No CVA tenderness. Resp: non-labored on room air Cardio: Nl rate GI: soft, non-tender; bowel sounds normal; no masses,  no organomegaly Extremities: extremities normal, atraumatic, no cyanosis or edema Pulses: 2+ and symmetric Lymph nodes: Cervical, supraclavicular, and axillary nodes normal. Neurologic: Grossly normal  Lab Results:   Recent Labs  10/07/14 0549  WBC 7.7  HGB 8.1*  HCT 27.2*  PLT 329   BMET  Recent Labs  10/07/14 0549  NA 138  K 4.5  CL 102  CO2 22  GLUCOSE 91  BUN 13  CREATININE 1.11*  CALCIUM 8.8   PT/INR No results for input(s): LABPROT, INR in the last  72 hours. ABG No results for input(s): PHART, HCO3 in the last 72 hours.  Invalid input(s): PCO2, PO2  Studies/Results: No results found.  Anti-infectives: Anti-infectives    Start     Dose/Rate Route Frequency Ordered Stop   10/05/14 0700  gentamicin (GARAMYCIN) IVPB 80 mg     80 mg100 mL/hr over 30 Minutes Intravenous 30 min pre-op 10/04/14 1103 10/05/14 1300   10/03/14 0200  ciprofloxacin (CIPRO) IVPB 400 mg  Status:  Discontinued     400 mg200 mL/hr over 60 Minutes Intravenous Every 12 hours 10/03/14 0055 10/05/14 1416   10/03/14 0200  metroNIDAZOLE (FLAGYL) IVPB 500 mg  Status:  Discontinued     500 mg100 mL/hr over 60 Minutes Intravenous Every 8 hours 10/03/14 0055 10/05/14 1416      Assessment/Plan:  1 - Right Hydronephrosis, 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 S/p recent stenting should help aide in dissection.   WIll follow. Certainly will need attempt bowel prep starting 12/7. Call with questions anytime.    [facility], Allison Mitchell 10/07/2014

## 2014-10-08 LAB — BASIC METABOLIC PANEL
ANION GAP: 15 (ref 5–15)
BUN: 10 mg/dL (ref 6–23)
CO2: 23 mEq/L (ref 19–32)
Calcium: 9 mg/dL (ref 8.4–10.5)
Chloride: 103 mEq/L (ref 96–112)
Creatinine, Ser: 0.93 mg/dL (ref 0.50–1.10)
GFR, EST AFRICAN AMERICAN: 80 mL/min — AB (ref >90)
GFR, EST NON AFRICAN AMERICAN: 69 mL/min — AB (ref >90)
Glucose, Bld: 80 mg/dL (ref 70–99)
POTASSIUM: 4 meq/L (ref 3.7–5.3)
SODIUM: 141 meq/L (ref 137–147)

## 2014-10-08 LAB — HEMOGLOBIN AND HEMATOCRIT, BLOOD
HCT: 28.9 % — ABNORMAL LOW (ref 36.0–46.0)
Hemoglobin: 8.5 g/dL — ABNORMAL LOW (ref 12.0–15.0)

## 2014-10-08 LAB — ABO/RH: ABO/RH(D): A POS

## 2014-10-08 MED ORDER — POLYETHYLENE GLYCOL 3350 17 G PO PACK
17.0000 g | PACK | Freq: Once | ORAL | Status: AC
  Administered 2014-10-08: 17 g via ORAL
  Filled 2014-10-08: qty 1

## 2014-10-08 NOTE — Progress Notes (Addendum)
3 Days Post-Op  Subjective: Continues to do well. Tolerating Ensure and  clear liquids without abdominal pain cramps. Says she had a somewhat formed bowel movement.  Objective: Vital signs in last 24 hours: Temp:  [98 F (36.7 C)-98.5 F (36.9 C)] 98 F (36.7 C) (12/06 0558) Pulse Rate:  [70-82] 70 (12/06 0558) Resp:  [16-18] 16 (12/06 0558) BP: (126-144)/(55-72) 130/72 mmHg (12/06 0558) SpO2:  [98 %-100 %] 98 % (12/06 0558) Last BM Date: 09/30/14  Intake/Output from previous day: 12/05 0701 - 12/06 0700 In: 9326 [P.O.:1200; I.V.:525] Out: -  Intake/Output this shift: Total I/O In: 480 [P.O.:480] Out: -   General appearance: Alert. Cooperative. Mental status normal. No distress Resp: clear to auscultation bilaterally GI: soft, non-tender; bowel sounds normal; no masses,  no organomegaly  Lab Results:   Recent Labs  10/07/14 0549 10/08/14 0743  WBC 7.7  --   HGB 8.1* 8.5*  HCT 27.2* 28.9*  PLT 329  --    BMET  Recent Labs  10/07/14 0549  NA 138  K 4.5  CL 102  CO2 22  GLUCOSE 91  BUN 13  CREATININE 1.11*  CALCIUM 8.8   PT/INR No results for input(s): LABPROT, INR in the last 72 hours. ABG No results for input(s): PHART, HCO3 in the last 72 hours.  Invalid input(s): PCO2, PO2  Studies/Results: No results found.  Anti-infectives: Anti-infectives    Start     Dose/Rate Route Frequency Ordered Stop   10/05/14 0700  gentamicin (GARAMYCIN) IVPB 80 mg     80 mg100 mL/hr over 30 Minutes Intravenous 30 min pre-op 10/04/14 1103 10/05/14 1300   10/03/14 0200  ciprofloxacin (CIPRO) IVPB 400 mg  Status:  Discontinued     400 mg200 mL/hr over 60 Minutes Intravenous Every 12 hours 10/03/14 0055 10/05/14 1416   10/03/14 0200  metroNIDAZOLE (FLAGYL) IVPB 500 mg  Status:  Discontinued     500 mg100 mL/hr over 60 Minutes Intravenous Every 8 hours 10/03/14 0055 10/05/14 1416      Assessment/Plan: s/p Procedure(s): CYSTOSCOPY WITH RETROGRADE PYELOGRAM,  URETEROSCOPY  AND STENT PLACEMEN  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 Switch diet to clear liquids tonight 1 dose of Miralax today to see how she does Abbreviated bowel prep and Entereg protocol to be considered tomorrow Proceed with surgical intervention on Tuesday, December 8. Dr. Hulen Skains and Dr. Tresa Moore will perform the surgery. Patient is aware of this.  Right hydronephrosis - Status post recent right ureteral stenting  Partial SBO. Asymptomatic on liquid diet.  Anemia. Check labs tomorrow   LOS: 5 days    Allison Mitchell 10/08/2014

## 2014-10-09 LAB — PREPARE RBC (CROSSMATCH)

## 2014-10-09 LAB — BASIC METABOLIC PANEL
Anion gap: 14 (ref 5–15)
BUN: 9 mg/dL (ref 6–23)
CALCIUM: 9 mg/dL (ref 8.4–10.5)
CO2: 24 mEq/L (ref 19–32)
CREATININE: 0.9 mg/dL (ref 0.50–1.10)
Chloride: 104 mEq/L (ref 96–112)
GFR calc Af Amer: 83 mL/min — ABNORMAL LOW (ref >90)
GFR, EST NON AFRICAN AMERICAN: 72 mL/min — AB (ref >90)
Glucose, Bld: 89 mg/dL (ref 70–99)
Potassium: 4.3 mEq/L (ref 3.7–5.3)
Sodium: 142 mEq/L (ref 137–147)

## 2014-10-09 LAB — CBC
HCT: 29.9 % — ABNORMAL LOW (ref 36.0–46.0)
Hemoglobin: 8.7 g/dL — ABNORMAL LOW (ref 12.0–15.0)
MCH: 20.7 pg — AB (ref 26.0–34.0)
MCHC: 29.1 g/dL — AB (ref 30.0–36.0)
MCV: 71 fL — ABNORMAL LOW (ref 78.0–100.0)
PLATELETS: 369 10*3/uL (ref 150–400)
RBC: 4.21 MIL/uL (ref 3.87–5.11)
RDW: 18.7 % — ABNORMAL HIGH (ref 11.5–15.5)
WBC: 6 10*3/uL (ref 4.0–10.5)

## 2014-10-09 MED ORDER — MAGNESIUM CITRATE PO SOLN
1.0000 | Freq: Once | ORAL | Status: AC
  Administered 2014-10-09: 1 via ORAL
  Filled 2014-10-09: qty 296

## 2014-10-09 MED ORDER — DEXTROSE 5 % IV SOLN
2.0000 g | INTRAVENOUS | Status: AC
Start: 2014-10-10 — End: 2014-10-10
  Administered 2014-10-10: 2 g via INTRAVENOUS
  Filled 2014-10-09 (×2): qty 2

## 2014-10-09 NOTE — Consult Note (Signed)
WOC consult requested for pre-surgical stoma site marking.  Requested to mark for possible ileostomy or colostomy.  Assessed abd while standing, sitting, lying.  Pt has two significant skin folds which would make it difficult to maintain pouch seal in the left or right lower quad.  Mark placed in LUQ: 7 cm to the left and 2 cm above umbilicus, and RUQ: 7 cm to the right and 2 cm above umbilicus.  Mark placed within rectus muscles, in line of vision, to area free from folds.  Briefly discussed pouching routines and demonstrated pouch appearance; educational materials left at bedside.  Pt asks appropriate questions.  Plan to follow after surgery for teaching sessions. Julien Girt MSN, RN, [place], [place], [place]

## 2014-10-09 NOTE — Progress Notes (Signed)
Patient ID: Fabiha Rougeau, female   DOB: 1960-11-26, 53 y.o.   MRN: [id number] 4 Days Post-Op  Subjective: Pt feels well with no complaints.  Had a solid BM over the weekend.  Still on clear liquids.  No pain  Objective: Vital signs in last 24 hours: Temp:  [97.6 F (36.4 C)-98.2 F (36.8 C)] 97.6 F (36.4 C) (12/07 6195) Pulse Rate:  [68-75] 68 (12/07 0632) Resp:  [16] 16 (12/07 0932) BP: (118-147)/(55-64) 118/59 mmHg (12/07 0632) SpO2:  [98 %] 98 % (12/07 0632) Last BM Date: 09/30/14  Intake/Output from previous day: 12/06 0701 - 12/07 0700 In: 2940 [P.O.:1440; I.V.:1500] Out: 350 [Urine:350] Intake/Output this shift:    PE: Abd: soft, NT, ND, +BS Heart: regular Lungs: CTAB  Lab Results:   Recent Labs  10/07/14 0549 10/08/14 0743  WBC 7.7  --   HGB 8.1* 8.5*  HCT 27.2* 28.9*  PLT 329  --    BMET  Recent Labs  10/07/14 0549 10/08/14 0743  NA 138 141  K 4.5 4.0  CL 102 103  CO2 22 23  GLUCOSE 91 80  BUN 13 10  CREATININE 1.11* 0.93  CALCIUM 8.8 9.0   PT/INR No results for input(s): LABPROT, INR in the last 72 hours. CMP     Component Value Date/Time   NA 141 10/08/2014 0743   K 4.0 10/08/2014 0743   CL 103 10/08/2014 0743   CO2 23 10/08/2014 0743   GLUCOSE 80 10/08/2014 0743   BUN 10 10/08/2014 0743   CREATININE 0.93 10/08/2014 0743   CALCIUM 9.0 10/08/2014 0743   GFRNONAA 69* 10/08/2014 0743   GFRAA 80* 10/08/2014 0743   Lipase  No results found for: LIPASE     Studies/Results: No results found.  Anti-infectives: Anti-infectives    Start     Dose/Rate Route Frequency Ordered Stop   10/10/14 0600  cefoTEtan (CEFOTAN) 2 g in dextrose 5 % 50 mL IVPB     2 g100 mL/hr over 30 Minutes Intravenous On call to O.R. 10/09/14 0825 10/11/14 0559   10/05/14 0700  gentamicin (GARAMYCIN) IVPB 80 mg     80 mg100 mL/hr over 30 Minutes Intravenous 30 min pre-op 10/04/14 1103 10/05/14 1300   10/03/14 0200  ciprofloxacin (CIPRO) IVPB 400 mg  Status:   Discontinued     400 mg200 mL/hr over 60 Minutes Intravenous Every 12 hours 10/03/14 0055 10/05/14 1416   10/03/14 0200  metroNIDAZOLE (FLAGYL) IVPB 500 mg  Status:  Discontinued     500 mg100 mL/hr over 60 Minutes Intravenous Every 8 hours 10/03/14 0055 10/05/14 1416       Assessment/Plan  1. 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 Right ureteral obstruction, s/p stenting 3. Anemia  Plan: 1. Type and screen completed for tomorrow 2. Cont clears today.  Will give a bottle of mag citrate to finish prep out 3. NPO p MN 4. Or planned for afternoon tomorrow 5. Cefotetan on call to OR 6. Procedure once again explained and discussed with the patient.  Still agreeable to proceed.   LOS: 6 days    Mykell Rawl E 10/09/2014, 8:25 AM Pager: [phone]

## 2014-10-09 NOTE — Progress Notes (Signed)
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 Cr <1. No contralateral lesions. Hydro appears quite chronic, but preserved rt renal parenchyma. Diagnostic Ureteroscopy 12/3 confirms predominanly extrinsic compression and 35F ureteral stent placed. Planning on combined Gen Surg - Urol colon resection, ureterolysis, possible ureteral reconstruction 10/10/14.  Today "Allison Mitchell" is w/o complaints. Had recent formed BM, receiving bowel prep today.   Objective: Vital signs in last 24 hours: Temp:  [97.6 F (36.4 C)-98.2 F (36.8 C)] 97.7 F (36.5 C) (12/07 1341) Pulse Rate:  [68-74] 74 (12/07 1341) Resp:  [16] 16 (12/07 1341) BP: (118-147)/(55-73) 141/73 mmHg (12/07 1341) SpO2:  [98 %] 98 % (12/07 1341) Last BM Date: 09/30/14  Intake/Output from previous day: 12/06 0701 - 12/07 0700 In: 2940 [P.O.:1440; I.V.:1500] Out: 350 [Urine:350] Intake/Output this shift:    General appearance: alert, cooperative, appears stated age and family at bedside Ears: normal TM's and external ear canals both ears Nose: Nares normal. Septum midline. Mucosa normal. No drainage or sinus tenderness. Throat: lips, mucosa, and tongue normal; teeth and gums normal Back: symmetric, no curvature. ROM normal. No CVA tenderness. Resp: non-labored on room air Cardio: Nl rate GI: soft, non-tender; bowel sounds normal; no masses,  no organomegaly Extremities: extremities normal, atraumatic, no cyanosis or edema Pulses: 2+ and symmetric Skin: Skin color, texture, turgor normal. No rashes or lesions Lymph nodes: Cervical, supraclavicular, and axillary nodes normal. Neurologic: Grossly normal  Lab Results:   Recent Labs  10/07/14 0549 10/08/14 0743 10/09/14 0803  WBC 7.7  --  6.0  HGB 8.1* 8.5* 8.7*  HCT 27.2* 28.9* 29.9*  PLT 329  --  369    BMET  Recent Labs  10/08/14 0743 10/09/14 0803  NA 141 142  K 4.0 4.3  CL 103 104  CO2 23 24  GLUCOSE 80 89  BUN 10 9  CREATININE 0.93 0.90  CALCIUM 9.0 9.0   PT/INR No results for input(s): LABPROT, INR in the last 72 hours. ABG No results for input(s): PHART, HCO3 in the last 72 hours.  Invalid input(s): PCO2, PO2  Studies/Results: No results found.  Anti-infectives: Anti-infectives    Start     Dose/Rate Route Frequency Ordered Stop   10/10/14 0600  cefoTEtan (CEFOTAN) 2 g in dextrose 5 % 50 mL IVPB     2 g100 mL/hr over 30 Minutes Intravenous On call to O.R. 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 S/p recent stenting should help aide in dissection.   T+C 2 units to be avail during surgery tomorrow.   [facility], Allison Mitchell 10/09/2014

## 2014-10-10 ENCOUNTER — Inpatient Hospital Stay (HOSPITAL_COMMUNITY): Payer: BC Managed Care – PPO | Admitting: Anesthesiology

## 2014-10-10 ENCOUNTER — Encounter (HOSPITAL_COMMUNITY): Payer: Self-pay | Admitting: Anesthesiology

## 2014-10-10 ENCOUNTER — Encounter (HOSPITAL_COMMUNITY)
Admission: AD | Disposition: A | Discharge status: Home / Self Care | Payer: Self-pay | Source: Other Acute Inpatient Hospital

## 2014-10-10 HISTORY — PX: LAPAROTOMY: SHX154

## 2014-10-10 HISTORY — PX: URETERAL REIMPLANTION: SHX2611

## 2014-10-10 HISTORY — PX: COLOSTOMY REVISION: SHX5232

## 2014-10-10 HISTORY — PX: COLOSTOMY: SHX63

## 2014-10-10 HISTORY — PX: PARTIAL COLECTOMY: SHX5273

## 2014-10-10 SURGERY — LAPAROTOMY, EXPLORATORY
Anesthesia: General | Site: Abdomen | Laterality: Right

## 2014-10-10 MED ORDER — PROPOFOL 10 MG/ML IV BOLUS
INTRAVENOUS | Status: DC | PRN
  Administered 2014-10-10: 150 mg via INTRAVENOUS

## 2014-10-10 MED ORDER — NEOSTIGMINE METHYLSULFATE 10 MG/10ML IV SOLN
INTRAVENOUS | Status: DC | PRN
  Administered 2014-10-10: 4 mg via INTRAVENOUS

## 2014-10-10 MED ORDER — DIPHENHYDRAMINE HCL 50 MG/ML IJ SOLN: 12.5000 mg | Freq: Four times a day (QID) | INTRAMUSCULAR | Status: DC | PRN

## 2014-10-10 MED ORDER — PROMETHAZINE HCL 25 MG/ML IJ SOLN: 6.2500 mg | INTRAMUSCULAR | Status: DC | PRN

## 2014-10-10 MED ORDER — DEXTROSE 5 % IV SOLN
2.0000 g | Freq: Two times a day (BID) | INTRAVENOUS | Status: AC
  Administered 2014-10-10: 2 g via INTRAVENOUS
  Filled 2014-10-10 (×2): qty 2

## 2014-10-10 MED ORDER — SCOPOLAMINE 1 MG/3DAYS TD PT72
1.0000 | MEDICATED_PATCH | Freq: Once | TRANSDERMAL | Status: AC
  Administered 2014-10-10: 1 via TRANSDERMAL

## 2014-10-10 MED ORDER — DIPHENHYDRAMINE HCL 50 MG/ML IJ SOLN
INTRAMUSCULAR | Status: AC
  Filled 2014-10-10: qty 1

## 2014-10-10 MED ORDER — SCOPOLAMINE 1 MG/3DAYS TD PT72
MEDICATED_PATCH | TRANSDERMAL | Status: AC
  Filled 2014-10-10: qty 1

## 2014-10-10 MED ORDER — SODIUM CHLORIDE 0.9 % IJ SOLN: 9.0000 mL | INTRAMUSCULAR | Status: DC | PRN

## 2014-10-10 MED ORDER — MEPERIDINE HCL 25 MG/ML IJ SOLN: 6.2500 mg | INTRAMUSCULAR | Status: DC | PRN

## 2014-10-10 MED ORDER — ROCURONIUM BROMIDE 100 MG/10ML IV SOLN
INTRAVENOUS | Status: DC | PRN
  Administered 2014-10-10: 5 mg via INTRAVENOUS
  Administered 2014-10-10: 50 mg via INTRAVENOUS

## 2014-10-10 MED ORDER — HYDROMORPHONE HCL 1 MG/ML IJ SOLN
INTRAMUSCULAR | Status: AC
  Filled 2014-10-10: qty 1

## 2014-10-10 MED ORDER — NALOXONE HCL 0.4 MG/ML IJ SOLN: 0.4000 mg | INTRAMUSCULAR | Status: DC | PRN

## 2014-10-10 MED ORDER — DEXAMETHASONE SODIUM PHOSPHATE 4 MG/ML IJ SOLN
INTRAMUSCULAR | Status: AC
  Filled 2014-10-10: qty 2

## 2014-10-10 MED ORDER — LACTATED RINGERS IV SOLN
INTRAVENOUS | Status: DC | PRN
  Administered 2014-10-10 (×3): via INTRAVENOUS

## 2014-10-10 MED ORDER — OXYCODONE HCL 5 MG PO TABS: 5.0000 mg | ORAL_TABLET | Freq: Once | ORAL | Status: DC | PRN

## 2014-10-10 MED ORDER — PHENOL 1.4 % MT LIQD
1.0000 | OROMUCOSAL | Status: DC | PRN
  Filled 2014-10-10: qty 177

## 2014-10-10 MED ORDER — HYDROMORPHONE 0.3 MG/ML IV SOLN
INTRAVENOUS | Status: AC
  Administered 2014-10-11: 0.741 mg via INTRAVENOUS
  Filled 2014-10-10: qty 25

## 2014-10-10 MED ORDER — MIDAZOLAM HCL 2 MG/2ML IJ SOLN: 0.5000 mg | Freq: Once | INTRAMUSCULAR | Status: DC | PRN

## 2014-10-10 MED ORDER — DEXAMETHASONE SODIUM PHOSPHATE 4 MG/ML IJ SOLN
INTRAMUSCULAR | Status: DC | PRN
  Administered 2014-10-10: 8 mg via INTRAVENOUS

## 2014-10-10 MED ORDER — HYDROMORPHONE 0.3 MG/ML IV SOLN
INTRAVENOUS | Status: DC
  Administered 2014-10-10: 2.79 mg via INTRAVENOUS
  Administered 2014-10-10: 17:00:00 via INTRAVENOUS
  Administered 2014-10-11: 3.39 mg via INTRAVENOUS
  Administered 2014-10-11: 1.19 mg via INTRAVENOUS
  Administered 2014-10-11: 2.99 mg via INTRAVENOUS
  Administered 2014-10-11: 1.99 mg via INTRAVENOUS
  Administered 2014-10-11: 0.399 mg via INTRAVENOUS
  Administered 2014-10-11: 741 mg via INTRAVENOUS
  Administered 2014-10-12: 1.59 mg via INTRAVENOUS
  Administered 2014-10-12: 0.6 mg via INTRAVENOUS
  Administered 2014-10-12: 0.399 mg via INTRAVENOUS
  Administered 2014-10-12: 18:00:00 via INTRAVENOUS
  Administered 2014-10-12: 0.4 mg via INTRAVENOUS
  Administered 2014-10-12 (×2): 0.6 mg via INTRAVENOUS
  Administered 2014-10-12 – 2014-10-13 (×2): 1.39 mg via INTRAVENOUS
  Filled 2014-10-10 (×2): qty 25

## 2014-10-10 MED ORDER — EPHEDRINE SULFATE 50 MG/ML IJ SOLN
INTRAMUSCULAR | Status: DC | PRN
  Administered 2014-10-10: 5 mg via INTRAVENOUS

## 2014-10-10 MED ORDER — LACTATED RINGERS IV SOLN
INTRAVENOUS | Status: DC
  Administered 2014-10-10 – 2014-10-17 (×2): via INTRAVENOUS

## 2014-10-10 MED ORDER — LACTATED RINGERS IV SOLN
INTRAVENOUS | Status: DC
  Administered 2014-10-10: 12:00:00 via INTRAVENOUS

## 2014-10-10 MED ORDER — DIPHENHYDRAMINE HCL 50 MG/ML IJ SOLN
10.0000 mg | Freq: Once | INTRAMUSCULAR | Status: DC
Start: 2014-10-10 — End: 2014-10-10

## 2014-10-10 MED ORDER — DEXTROSE 5 % IV SOLN
2.0000 g | INTRAVENOUS | Status: DC | PRN
  Administered 2014-10-10: 2 g via INTRAVENOUS

## 2014-10-10 MED ORDER — FENTANYL CITRATE 0.05 MG/ML IJ SOLN
INTRAMUSCULAR | Status: DC | PRN
  Administered 2014-10-10 (×2): 50 ug via INTRAVENOUS
  Administered 2014-10-10: 100 ug via INTRAVENOUS
  Administered 2014-10-10: 50 ug via INTRAVENOUS

## 2014-10-10 MED ORDER — OXYCODONE HCL 5 MG/5ML PO SOLN: 5.0000 mg | Freq: Once | ORAL | Status: DC | PRN

## 2014-10-10 MED ORDER — SODIUM CHLORIDE 0.9 % IR SOLN
Status: DC | PRN
  Administered 2014-10-10 (×3): 1000 mL

## 2014-10-10 MED ORDER — PROPOFOL 10 MG/ML IV BOLUS
INTRAVENOUS | Status: AC
  Filled 2014-10-10: qty 20

## 2014-10-10 MED ORDER — GLYCOPYRROLATE 0.2 MG/ML IJ SOLN
INTRAMUSCULAR | Status: DC | PRN
  Administered 2014-10-10: 0.6 mg via INTRAVENOUS

## 2014-10-10 MED ORDER — MIDAZOLAM HCL 5 MG/5ML IJ SOLN
INTRAMUSCULAR | Status: DC | PRN
  Administered 2014-10-10: 2 mg via INTRAVENOUS

## 2014-10-10 MED ORDER — ONDANSETRON HCL 4 MG/2ML IJ SOLN: 4.0000 mg | Freq: Four times a day (QID) | INTRAMUSCULAR | Status: DC | PRN

## 2014-10-10 MED ORDER — FENTANYL CITRATE 0.05 MG/ML IJ SOLN
INTRAMUSCULAR | Status: AC
  Filled 2014-10-10: qty 5

## 2014-10-10 MED ORDER — MIDAZOLAM HCL 2 MG/2ML IJ SOLN
INTRAMUSCULAR | Status: AC
  Filled 2014-10-10: qty 2

## 2014-10-10 MED ORDER — PHENYLEPHRINE HCL 10 MG/ML IJ SOLN
INTRAMUSCULAR | Status: DC | PRN
  Administered 2014-10-10: 40 ug via INTRAVENOUS
  Administered 2014-10-10: 80 ug via INTRAVENOUS

## 2014-10-10 MED ORDER — STERILE WATER FOR IRRIGATION IR SOLN
Status: DC | PRN
  Administered 2014-10-10: 1000 mL

## 2014-10-10 MED ORDER — HYDROMORPHONE HCL 1 MG/ML IJ SOLN
0.2500 mg | INTRAMUSCULAR | Status: DC | PRN
  Administered 2014-10-10: 0.5 mg via INTRAVENOUS
  Administered 2014-10-10: 0.25 mg via INTRAVENOUS
  Administered 2014-10-10 (×2): 0.5 mg via INTRAVENOUS
  Administered 2014-10-10: 0.25 mg via INTRAVENOUS

## 2014-10-10 MED ORDER — DIPHENHYDRAMINE HCL 50 MG/ML IJ SOLN
INTRAMUSCULAR | Status: DC | PRN
  Administered 2014-10-10: 12.5 mg via INTRAVENOUS

## 2014-10-10 MED ORDER — ONDANSETRON HCL 4 MG/2ML IJ SOLN
INTRAMUSCULAR | Status: DC | PRN
  Administered 2014-10-10: 4 mg via INTRAVENOUS

## 2014-10-10 MED ORDER — DIPHENHYDRAMINE HCL 12.5 MG/5ML PO ELIX: 12.5000 mg | ORAL_SOLUTION | Freq: Four times a day (QID) | ORAL | Status: DC | PRN

## 2014-10-10 MED ORDER — LIDOCAINE HCL (CARDIAC) 20 MG/ML IV SOLN
INTRAVENOUS | Status: DC | PRN
  Administered 2014-10-10: 30 mg via INTRAVENOUS

## 2014-10-10 SURGICAL SUPPLY — 86 items
APPLIER CLIP ROT 10 11.4 M/L (STAPLE)
BAG URO CATCHER STRL LF (DRAPE) IMPLANT
BLADE SURG ROTATE 9660 (MISCELLANEOUS) ×6 IMPLANT
CANISTER SUCTION 2500CC (MISCELLANEOUS) ×6 IMPLANT
CATH INTERMIT  6FR 70CM (CATHETERS) IMPLANT
CELLS DAT CNTRL 66122 CELL SVR (MISCELLANEOUS) IMPLANT
CHLORAPREP W/TINT 26ML (MISCELLANEOUS) ×6 IMPLANT
CLIP APPLIE ROT 10 11.4 M/L (STAPLE) IMPLANT
CLIP TI LARGE 6 (CLIP) ×6 IMPLANT
COVER MAYO STAND STRL (DRAPES) ×6 IMPLANT
COVER SURGICAL LIGHT HANDLE (MISCELLANEOUS) ×6 IMPLANT
DRAPE LAPAROSCOPIC ABDOMINAL (DRAPES) ×6 IMPLANT
DRAPE PROXIMA HALF (DRAPES) ×6 IMPLANT
DRAPE UTILITY XL STRL (DRAPES) IMPLANT
DRAPE WARM FLUID 44X44 (DRAPE) ×6 IMPLANT
DRSG OPSITE POSTOP 4X10 (GAUZE/BANDAGES/DRESSINGS) ×6 IMPLANT
DRSG OPSITE POSTOP 4X8 (GAUZE/BANDAGES/DRESSINGS) IMPLANT
ELECT BLADE 6.5 EXT (BLADE) ×6 IMPLANT
ELECT CAUTERY BLADE 6.4 (BLADE) ×6 IMPLANT
ELECT REM PT RETURN 9FT ADLT (ELECTROSURGICAL) ×6
ELECTRODE REM PT RTRN 9FT ADLT (ELECTROSURGICAL) ×4 IMPLANT
GEL ULTRASOUND 20GR AQUASONIC (MISCELLANEOUS) IMPLANT
GLOVE BIO SURGEON STRL SZ7.5 (GLOVE) ×18 IMPLANT
GLOVE BIOGEL PI IND STRL 7.0 (GLOVE) ×4 IMPLANT
GLOVE BIOGEL PI IND STRL 7.5 (GLOVE) ×12 IMPLANT
GLOVE BIOGEL PI IND STRL 8 (GLOVE) ×4 IMPLANT
GLOVE BIOGEL PI INDICATOR 7.0 (GLOVE) ×2
GLOVE BIOGEL PI INDICATOR 7.5 (GLOVE) ×6
GLOVE BIOGEL PI INDICATOR 8 (GLOVE) ×2
GLOVE ECLIPSE 7.5 STRL STRAW (GLOVE) ×6 IMPLANT
GLOVE SURG SS PI 7.0 STRL IVOR (GLOVE) ×6 IMPLANT
GOWN STRL REUS W/ TWL LRG LVL3 (GOWN DISPOSABLE) ×12 IMPLANT
GOWN STRL REUS W/TWL LRG LVL3 (GOWN DISPOSABLE) ×6
GUIDEWIRE ANG ZIPWIRE 038X150 (WIRE) IMPLANT
GUIDEWIRE STR DUAL SENSOR (WIRE) IMPLANT
IV NS IRRIG 3000ML ARTHROMATIC (IV SOLUTION) IMPLANT
KIT BASIN OR (CUSTOM PROCEDURE TRAY) ×6 IMPLANT
KIT OSTOMY DRAINABLE 2.75 STR (WOUND CARE) ×6 IMPLANT
KIT ROOM TURNOVER OR (KITS) ×6 IMPLANT
LEGGING LITHOTOMY PAIR STRL (DRAPES) ×6 IMPLANT
LIGASURE IMPACT 36 18CM CVD LR (INSTRUMENTS) IMPLANT
LOOP VESSEL MAXI BLUE (MISCELLANEOUS) ×6 IMPLANT
NS IRRIG 1000ML POUR BTL (IV SOLUTION) ×12 IMPLANT
PACK CYSTO (CUSTOM PROCEDURE TRAY) IMPLANT
PACK GENERAL/GYN (CUSTOM PROCEDURE TRAY) ×6 IMPLANT
PAD ARMBOARD 7.5X6 YLW CONV (MISCELLANEOUS) ×6 IMPLANT
PENCIL BUTTON HOLSTER BLD 10FT (ELECTRODE) IMPLANT
RELOAD PROXIMATE 75MM BLUE (ENDOMECHANICALS) ×18 IMPLANT
RTRCTR WOUND ALEXIS 18CM MED (MISCELLANEOUS)
SEPRAFILM PROCEDURAL PACK 3X5 (MISCELLANEOUS) ×6 IMPLANT
SPECIMEN JAR LARGE (MISCELLANEOUS) IMPLANT
SPONGE LAP 18X18 X RAY DECT (DISPOSABLE) ×6 IMPLANT
STAPLER CUT CVD 40MM BLUE (STAPLE) ×6 IMPLANT
STAPLER PROXIMATE 75MM BLUE (STAPLE) ×6 IMPLANT
STAPLER VISISTAT 35W (STAPLE) ×6 IMPLANT
SUCTION POOLE TIP (SUCTIONS) ×6 IMPLANT
SURGILUBE 2OZ TUBE FLIPTOP (MISCELLANEOUS) IMPLANT
SUT NOVA 1 T20/GS 25DT (SUTURE) IMPLANT
SUT PDS AB 1 CT  36 (SUTURE)
SUT PDS AB 1 CT 36 (SUTURE) IMPLANT
SUT PDS AB 1 TP1 96 (SUTURE) ×12 IMPLANT
SUT PROLENE 2 0 CT2 30 (SUTURE) IMPLANT
SUT PROLENE 2 0 KS (SUTURE) IMPLANT
SUT SILK 2 0 (SUTURE)
SUT SILK 2 0 SH CR/8 (SUTURE) ×6 IMPLANT
SUT SILK 2 0 TIES 10X30 (SUTURE) ×6 IMPLANT
SUT SILK 2 0SH CR/8 30 (SUTURE) ×6 IMPLANT
SUT SILK 2-0 18XBRD TIE 12 (SUTURE) IMPLANT
SUT SILK 3 0 (SUTURE)
SUT SILK 3 0 SH CR/8 (SUTURE) ×6 IMPLANT
SUT SILK 3 0 TIES 10X30 (SUTURE) ×12 IMPLANT
SUT SILK 3-0 18XBRD TIE 12 (SUTURE) IMPLANT
SUT VIC AB 3-0 SH 18 (SUTURE) ×6 IMPLANT
SYR BULB IRRIGATION 50ML (SYRINGE) IMPLANT
SYR CONTROL 10ML LL (SYRINGE) ×6 IMPLANT
SYRINGE 10CC LL (SYRINGE) ×6 IMPLANT
SYS LAPSCP GELPORT 120MM (MISCELLANEOUS)
SYSTEM LAPSCP GELPORT 120MM (MISCELLANEOUS) IMPLANT
TOWEL OR 17X24 6PK STRL BLUE (TOWEL DISPOSABLE) ×6 IMPLANT
TOWEL OR 17X26 10 PK STRL BLUE (TOWEL DISPOSABLE) ×6 IMPLANT
TRAY FOLEY CATH 16FRSI W/METER (SET/KITS/TRAYS/PACK) ×6 IMPLANT
TRAY PROCTOSCOPIC FIBER OPTIC (SET/KITS/TRAYS/PACK) IMPLANT
TUBE CONNECTING 12'X1/4 (SUCTIONS)
TUBE CONNECTING 12X1/4 (SUCTIONS) IMPLANT
TUBE FEEDING 8FR 16IN STR KANG (MISCELLANEOUS) IMPLANT
YANKAUER SUCT BULB TIP NO VENT (SUCTIONS) ×6 IMPLANT

## 2014-10-10 NOTE — Op Note (Signed)
OPERATIVE REPORT  DATE OF OPERATION: 10/03/2014 - 10/10/2014  PATIENT:  Allison Mitchell  53 y.o. female  PRE-OPERATIVE DIAGNOSIS:  COLON CANCER WITH RIGHT URETERAL OBSTRUCTION AND LOCAL INVASION  POST-OPERATIVE DIAGNOSIS:  COLON CANCER OF THE CECUM, TERMINAL ILEUM, AND SIGMOID COLON WITH RIGHT URETERAL INVOLVEMENT.  PROCEDURE:  Procedure(s): EXPLORATORY LAPAROTOMY OPEN RIGHT URETERAL LYSIS  PARTIAL SIGMOID COLECTOMY COLOSTOMY PARTIAL RIGHT COLECTOMY WITH REMOVAL OF THE TERMINAL ILEUM  SURGEON:  Surgeon(s): Doreen Salvage, MD Alexis Frock, MD Doreen Salvage, MD Alexis Frock, MD  ASSISTANT: Tresa Moore, M.D.  ANESTHESIA:   general  EBL: 125 ml  BLOOD ADMINISTERED: none  DRAINS: Nasogastric Tube, Urinary Catheter (Foley) and Left sided colostomy   SPECIMEN:  Source of Specimen:  Terminal ileum, cecum, partial sigmoid colon, and periureteral tissue  COUNTS CORRECT:  YES  PROCEDURE DETAILS: The patient was taken to the operating room and placed on the table initially in the supine position. After an adequate general endotracheal anesthetic was administered she was placed in the lithotomy position and subsequently prepped and draped in the usual sterile manner exposing her entire abdomen and also her perineum for possible low anterior resection and cystoscopy.  A proper timeout was performed identifying the patient and the procedure to be performed. A midline incision was made from approximately 5-6 cm above the umbilicus down to the pubic crest. It was taken down to and through the midline fascia using electrocautery. Once the peritoneum was opened we fully opened incision to the full extent of the skin incision. Initially a Balfour retractor was placed along with a bladder blade with the patient in Trendelenburg position. Upon manually inspecting the peritoneal cavity the liver appeared to be completely normal. There was no evidence of carcinomatosis. The spleen was completely normal. The NG  tube which been placed by the anesthesia staff was in good position. There was a large fixed mass in the right lower quadrant of the abdomen.  We initially started the dissection using a Balfour retractor however we quickly switched to an Omni retractor in order to get adequate visualization. In order to mobilize the mass in the right lower quadrant we actually had to go into the right paracolic extraperitoneal area and dissect away the peritoneum from the retroperitoneum as we rotated the mass and the cecum medially. It was very obvious where the tumor grew into the terminal ileum medially and there was an attached portion of sigmoid colon to the inferior medial side of the tumor. In order to get adequate mobilization we came across the distal ascending colon with a GIA-75 stapler. This allowed continue mobilization of this part prior to resecting the entire tumor.  We went down into the pelvis were the tumor was attached to the mid to distal sigmoid colon. No attempt was made to separate the sigmoid colon from the tumor mass however we came across the mid to distal sigmoid colon using a Contour stapling device. The last portion of bowel that was attached to this large mass was the terminal ileum which came into the mass from the medial aspect. We came across the terminal ileum using a GIA-75 stapler.  The proximal sigmoid colon was also transected with a GIA-75 stapler.  This end became the colostomy.  As we rotated the tumor medially we eventually were able to palpate the ureteral stent which been placed in the patient on admission because of right ureteral obstruction. At the time of that ureteroscopy there was no evidence of direct invasion of tumor into the  right ureter. We were able to dissect away the tumor mass and inflammatory mass away from the right ureter which appeared to still be somewhat distended. There did not appear to be direct invasion of the tumor into the right ureter. As we rotated more  medially we came across the mesentery of the right colon and the terminal ileum using a LigaSure device and also 2-0 silk ties in certain large vessel situations. Ultimately with the terminal ileum, distal ascending colon, and the partial sigmoid colon mesentery free the mass was freed up. We marked the distal margin of the sigmoid colon with a long suture. The specimen was sent off the field. Time was then spent dissecting away what appeared to be possible tumor around the distal ureter done by Dr. Tresa Moore.  Once the ureter was freed and an adequate tissue specimen sent, an anastomosis was made between the terminal ileum or the distal ileum and the distal ascending colon. A GIA-75 stapler was used to perform the anastomosis, and a TX 60 stapler used to perform the anastomosis and closure of the enterotomy.  After the anastomosis was completed we irrigated with approximately 2 L of saline. We then changed our gloves. A colostomy was brought out the left upper to mid aspect of the abdomen. We then closed the abdomen using running looped #1 PDS suture after Seprafilm had been placed in the abdomen.  Once the fascia was closed the skin was closed using stainless steel staples. We then matured the colostomy using interrupted 3-0 Vicryl sutures.  A sterile dressing was applied and a stomal device applied to the newly created colostomy. All needle counts, sponge counts, and instrument counts were correct.  PATIENT DISPOSITION:  PACU - hemodynamically stable.   Mame Twombly, JAY 12/8/20154:54 PM

## 2014-10-10 NOTE — Anesthesia Postprocedure Evaluation (Signed)
Anesthesia Post Note  Patient: Allison Mitchell  Procedure(s) Performed: Procedure(s) (LRB): EXPLORATORY LAPAROTOMY (N/A) OPEN RIGHT URETERAL LYSIS  (Right) PARTIAL SIGMOID COLECTOMY COLOSTOMY PARTIAL RIGHT COLECTOMY  Anesthesia type: General  Patient location: PACU  Post pain: Pain level controlled and Adequate analgesia  Post assessment: Post-op Vital signs reviewed, Patient's Cardiovascular Status Stable, Respiratory Function Stable, Patent Airway and Pain level controlled  Last Vitals:  Filed Vitals:   10/10/14 1730  BP:   Pulse: 77  Temp:   Resp: 13    Post vital signs: Reviewed and stable  Level of consciousness: awake, alert  and oriented  Complications: No apparent anesthesia complications

## 2014-10-10 NOTE — Transfer of Care (Signed)
Immediate Anesthesia Transfer of Care Note  Patient: Allison Mitchell  Procedure(s) Performed: Procedure(s): EXPLORATORY LAPAROTOMY (N/A) OPEN RIGHT URETERAL LYSIS  (Right) PARTIAL SIGMOID COLECTOMY COLOSTOMY PARTIAL RIGHT COLECTOMY  Patient Location: PACU  Anesthesia Type:General  Level of Consciousness: awake, alert , oriented and patient cooperative  Airway & Oxygen Therapy: Patient Spontanous Breathing and Patient connected to nasal cannula oxygen  Post-op Assessment: Report given to PACU RN and Post -op Vital signs reviewed and stable  Post vital signs: Reviewed and stable  Complications: No apparent anesthesia complications

## 2014-10-10 NOTE — Brief Op Note (Signed)
10/03/2014 - 10/10/2014  4:54 PM  PATIENT:  Allison Mitchell  53 y.o. female  PRE-OPERATIVE DIAGNOSIS:  COLON CANCER, Right Hydronephrosis  POST-OPERATIVE DIAGNOSIS:  COLON CANCER, Right Hydronephrosis  PROCEDURE:  Open Right Ureterolysis  SURGEON:  Surgeon(s) and Role:     * Alexis Frock, MD - Primary    * Doreen Salvage, MD - Assisting  PHYSICIAN ASSISTANT:   ASSISTANTS: Gypsy Lore MD (resident)   ANESTHESIA:   general  EBL:  Total I/O In: 0240 [I.V.:2300] Out: 9735 [Urine:1200; Blood:115]  BLOOD ADMINISTERED:none  DRAINS: foley to straight drain   LOCAL MEDICATIONS USED:  NONE  SPECIMEN:  Source of Specimen:  Rt periureteral tissue  DISPOSITION OF SPECIMEN:  PATHOLOGY  COUNTS:  YES  TOURNIQUET:  * No tourniquets in log *  DICTATION: .Other Dictation: Dictation Number [id number]  PLAN OF CARE: Admit to inpatient   PATIENT DISPOSITION:  PACU - hemodynamically stable.   Delay start of Pharmacological VTE agent (>24hrs) due to surgical blood loss or risk of bleeding: not applicable

## 2014-10-10 NOTE — Progress Notes (Signed)
Potential stoma sites have been marked and I appreciate that.  Still unsure exactly what will be done as far as anastomoses are concerned.    Kathryne Eriksson. Dahlia Bailiff, MD, [place] [phone] [facility]

## 2014-10-10 NOTE — Anesthesia Preprocedure Evaluation (Addendum)
Anesthesia Evaluation  Patient identified by MRN, date of birth, ID band Patient awake    Reviewed: Allergy & Precautions, H&P , NPO status , Patient's Chart, lab work & pertinent test results  History of Anesthesia Complications Negative for: history of anesthetic complications  Airway Mallampati: II  TM Distance: >3 FB Neck ROM: Full    Dental  (+) Dental Advisory Given, Teeth Intact   Pulmonary former smoker,  breath sounds clear to auscultation        Cardiovascular - anginanegative cardio ROS  Rhythm:Regular Rate:Normal     Neuro/Psych Depression negative neurological ROS     GI/Hepatic Neg liver ROS, GERD-  Medicated and Poorly Controlled,Small bowel cancer   Endo/Other  Morbid obesity  Renal/GU ureteral obstruction     Musculoskeletal   Abdominal (+) + obese,   Peds  Hematology  (+) Blood dyscrasia (Hb 8,7), ,   Anesthesia Other Findings   Reproductive/Obstetrics                            Anesthesia Physical Anesthesia Plan  ASA: III  Anesthesia Plan: General   Post-op Pain Management:    Induction: Intravenous  Airway Management Planned: Oral ETT  Additional Equipment:   Intra-op Plan:   Post-operative Plan: Extubation in OR  Informed Consent: I have reviewed the patients History and Physical, chart, labs and discussed the procedure including the risks, benefits and alternatives for the proposed anesthesia with the patient or authorized representative who has indicated his/her understanding and acceptance.   Dental advisory given  Plan Discussed with: CRNA and Surgeon  Anesthesia Plan Comments: (Plan routine monitors, GETA)        Anesthesia Quick Evaluation

## 2014-10-11 ENCOUNTER — Encounter (HOSPITAL_COMMUNITY): Payer: Self-pay | Admitting: General Surgery

## 2014-10-11 LAB — BASIC METABOLIC PANEL
Anion gap: 20 — ABNORMAL HIGH (ref 5–15)
BUN: 12 mg/dL (ref 6–23)
CALCIUM: 8.6 mg/dL (ref 8.4–10.5)
CO2: 18 mEq/L — ABNORMAL LOW (ref 19–32)
CREATININE: 1.23 mg/dL — AB (ref 0.50–1.10)
Chloride: 99 mEq/L (ref 96–112)
GFR calc non Af Amer: 49 mL/min — ABNORMAL LOW (ref >90)
GFR, EST AFRICAN AMERICAN: 57 mL/min — AB (ref >90)
Glucose, Bld: 170 mg/dL — ABNORMAL HIGH (ref 70–99)
Potassium: 5.8 mEq/L — ABNORMAL HIGH (ref 3.7–5.3)
Sodium: 137 mEq/L (ref 137–147)

## 2014-10-11 LAB — CBC
HCT: 34.4 % — ABNORMAL LOW (ref 36.0–46.0)
Hemoglobin: 9.8 g/dL — ABNORMAL LOW (ref 12.0–15.0)
MCH: 20.9 pg — AB (ref 26.0–34.0)
MCHC: 28.5 g/dL — AB (ref 30.0–36.0)
MCV: 73.3 fL — ABNORMAL LOW (ref 78.0–100.0)
PLATELETS: 482 10*3/uL — AB (ref 150–400)
RBC: 4.69 MIL/uL (ref 3.87–5.11)
RDW: 18.8 % — AB (ref 11.5–15.5)
WBC: 18.2 10*3/uL — ABNORMAL HIGH (ref 4.0–10.5)

## 2014-10-11 LAB — POTASSIUM: Potassium: 4.9 mEq/L (ref 3.7–5.3)

## 2014-10-11 NOTE — Op Note (Signed)
NAMESHEMICKA, COHRS NO.:  [id number]  MEDICAL RECORD NO.:  [id number]  LOCATION:  6N10C                        FACILITY:  [facility]  PHYSICIAN:  Alexis Frock, MD     DATE OF BIRTH:  Aug 29, 1961  DATE OF PROCEDURE:  10/10/2014 DATE OF DISCHARGE:                              OPERATIVE REPORT   PREOPERATIVE DIAGNOSES:  Right hydronephrosis, colon cancer.  PROCEDURE:  Open right ureterolysis.  SURGEON:  Alexis Frock, MD  ASSISTANTS: 1. Gwenyth Ober, M.D. 2. Gypsy Lore, MD.  ESTIMATED BLOOD LOSS:  Nil.  COMPLICATIONS:  None.  SPECIMENS:  Right periureteral tissue for permanent pathology.  DRAINS: 1. Foley catheter to straight drain. 2. Colostomy.  INDICATIONS:  Ms. Pascal is a pleasant, but unfortunate 53 year old lady with recent history of obstructive bowel symptoms who was found on workup to have a large colonic neoplasm involving the cecum, distal ileum, and possibly sigmoid.  She was found to have severe hydroureteronephrosis associated with questionable direct ureteral involvement.  She underwent cystoscopy with ureteroscopy and stent placement last week, and no obvious intraluminal ureteral invasion was seen.  She also underwent further staging, which revealed no obvious metastatic disease.  Options were discussed for further management including open surgical extirpation with colon resection with possibly ureterolysis versus ureteral resection and interposition, she wished to proceed.  The general surgical portions of procedure being done primarily by Dr. Hulen Skains and urologic procedures being done by myself.  PROCEDURE IN DETAIL:  The patient being Marta Bouie verified, procedure being right ureterolysis versus resection and interposition were confirmed.  Procedure was carried out.  Time-out was performed. Intravenous antibiotics were administered.  General endotracheal anesthesia had already been introduced.  An exploratory laparotomy  had already been performed with partial resection of the bowel.  The posterior peritoneum was incised over the area of the right iliac vessels and dissection was performed on to what was felt to be the right ureter with ureteral stent.  This was carefully circumferentially mobilized and visualized and found to be the right ureter.  This was Intimately  associated with the posterior aspect of the mass in question without obvious direct invasion.  Very carefully ureterolysis was performed, developing a plane sharply and bluntly between the anterior and medial surfaces of the ureter and the primary colonic neoplasm.  This ureterolysis was performed at the position approximately 3 cm above the iliac vessels to a position approximately 4 cm below. Ureterolysis successfully allowed the neoplasm to be dissected away from the ureter without need for direct ureteral resection.  An area adherent rind of tissue that was directly adherent to the ureter and mass was dissected away, set aside for permanent pathology with the ureteral side being inked and labeled as right periureteral tissue.  Dr. Hulen Skains then completed the colon resection, anastomosis, and colostomy as per separate dictated operative note.  I felt there was no need for any peritoneal drain given that the ureter was not directly violated or excessively skeletonozed.  Procedure was then turned over to Dr. Hulen Skains as primary surgeon following formal ureterolysis.          ______________________________ Alexis Frock, MD     TM/MEDQ  D:  10/10/2014  T:  10/11/2014  Job:  [id number]

## 2014-10-11 NOTE — Progress Notes (Addendum)
Pt foley kept intact as ordered by PA Claiborne Billings. P. Amo Inette Doubrava Rn.

## 2014-10-11 NOTE — Progress Notes (Signed)
Patient ID: Allison Mitchell, female   DOB: Dec 07, 1960, 53 y.o.   MRN: [id number] 1 Day Post-Op  Subjective: Pt is very sedated this morning and complaining of a lot of pain  Objective: Vital signs in last 24 hours: Temp:  [97.3 F (36.3 C)-99 F (37.2 C)] 99 F (37.2 C) (12/09 0532) Pulse Rate:  [73-96] 94 (12/09 0532) Resp:  [10-23] 10 (12/09 3810) BP: (108-127)/(55-69) 116/62 mmHg (12/09 0532) SpO2:  [93 %-99 %] 95 % (12/09 0637) Last BM Date: 10/09/14  Intake/Output from previous day: 12/08 0701 - 12/09 0700 In: 3945.7 [I.V.:3895.7; IV Piggyback:50] Out: 2505 [Urine:2350; Emesis/NG output:10; Stool:30; Blood:115] Intake/Output this shift:    PE: Abd: soft, tender, hypoactive BS, but no NGT output, stoma is dusky, no output in ostomy currently.  Midline incision is c/d/i with staples Heart: regular LungS: CTAB  Lab Results:   Recent Labs  10/09/14 0803 10/11/14 0337  WBC 6.0 18.2*  HGB 8.7* 9.8*  HCT 29.9* 34.4*  PLT 369 482*   BMET  Recent Labs  10/09/14 0803 10/11/14 0337  NA 142 137  K 4.3 5.8*  CL 104 99  CO2 24 18*  GLUCOSE 89 170*  BUN 9 12  CREATININE 0.90 1.23*  CALCIUM 9.0 8.6   PT/INR No results for input(s): LABPROT, INR in the last 72 hours. CMP     Component Value Date/Time   NA 137 10/11/2014 0337   K 5.8* 10/11/2014 0337   CL 99 10/11/2014 0337   CO2 18* 10/11/2014 0337   GLUCOSE 170* 10/11/2014 0337   BUN 12 10/11/2014 0337   CREATININE 1.23* 10/11/2014 0337   CALCIUM 8.6 10/11/2014 0337   GFRNONAA 49* 10/11/2014 0337   GFRAA 57* 10/11/2014 0337   Lipase  No results found for: LIPASE     Studies/Results: No results found.  Anti-infectives: Anti-infectives    Start     Dose/Rate Route Frequency Ordered Stop   10/10/14 2200  cefoTEtan (CEFOTAN) 2 g in dextrose 5 % 50 mL IVPB     2 g100 mL/hr over 30 Minutes Intravenous Every 12 hours 10/10/14 1819 10/10/14 2147   10/10/14 0600  cefoTEtan (CEFOTAN) 2 g in dextrose 5 % 50  mL IVPB     2 g100 mL/hr over 30 Minutes Intravenous On call to O.R. 10/09/14 0825 10/10/14 0607   10/05/14 0700  gentamicin (GARAMYCIN) IVPB 80 mg     80 mg100 mL/hr over 30 Minutes Intravenous 30 min pre-op 10/04/14 1103 10/05/14 1300   10/03/14 0200  ciprofloxacin (CIPRO) IVPB 400 mg  Status:  Discontinued     400 mg200 mL/hr over 60 Minutes Intravenous Every 12 hours 10/03/14 0055 10/05/14 1416   10/03/14 0200  metroNIDAZOLE (FLAGYL) IVPB 500 mg  Status:  Discontinued     500 mg100 mL/hr over 60 Minutes Intravenous Every 8 hours 10/03/14 0055 10/05/14 1416       Assessment/Plan  1. POD 1, s/p right colectomy with ileocolonic anastomosis with sigmoid colectomy and colostomy 2. Stage 3 adenocarcinoma, await final pathology 3. Right ureteral obstruction, s/p stenting 4. Mild ARI, follow 5. Leukocytosis, likely reactive 6. Hyperkalemia  Plan: 1. No K in fluids, unsure why K elevated this morning.  Will recheck at 1300 today.  May need to give something to bring this down a bit 2. Cont dilaudid PCA, but watch her closely as she is quite sedated today.  Do not want to adjust this any higher for pain control as she would be at  risk for decreased respiratory drive at that point 3. Leave foley today as I do not anticipate given her current state she will be mobilizing today 4. Recheck labs in the am    LOS: 8 days    Claud Gowan E 10/11/2014, 9:07 AM Pager: [phone]

## 2014-10-11 NOTE — Consult Note (Addendum)
WOC follow-up: Pt is groggy and has an NG; will not perform teaching session on the first post-op day.  Colostomy to left upper quad; pouch intact with good seal. Stoma slightly above skin level, red and viable. Small amt pink drainage, no stool or flatus at this time.  Supplies ordered to bedside and plan to change tomorrow if patient feels ready. Julien Girt MSN, RN, [place], [place], [place]

## 2014-10-11 NOTE — Progress Notes (Signed)
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 No gross direct ureteral involvement, final path pending. Had Rt JJ stent placed 12/3.   Today "Allison Mitchell" is w/o complaints. She is still pretty tired following surgery yesterday, slight Cr bump noted on AM labs. UOP preserved.   Objective: Vital signs in last 24 hours: Temp:  [97.3 F (36.3 C)-99 F (37.2 C)] 99 F (37.2 C) (12/09 0532) Pulse Rate:  [73-96] 94 (12/09 0532) Resp:  [10-23] 10 (12/09 9767) BP: (108-127)/(55-69) 116/62 mmHg (12/09 0532) SpO2:  [93 %-99 %] 95 % (12/09 0637) Last BM Date: 10/09/14  Intake/Output from previous day: 12/08 0701 - 12/09 0700 In: 3945.7 [I.V.:3895.7; IV Piggyback:50] Out: 2505 [Urine:2350; Emesis/NG output:10; Stool:30; Blood:115] Intake/Output this shift:    General appearance: alert and cooperative Nose: Nares normal. Septum midline. Mucosa normal. No drainage or sinus tenderness., NGT in place Throat: lips, mucosa, and tongue normal; teeth and gums normal Neck: supple, symmetrical, trachea midline Back: symmetric, no curvature. ROM normal. No CVA tenderness. Cardio: regular rythem GI: soft, non-tender; bowel sounds normal; no masses,  no organomegaly and Left sided colsotomy pink. Midline wound c/d/i. No abd distension.  Pelvic: no adnexal masses or tenderness and foley c/d/i. Yellow urine.  Extremities: extremities normal, atraumatic, no cyanosis or edema Skin: Skin color, texture, turgor normal. No rashes or lesions Lymph nodes: Cervical, supraclavicular, and axillary nodes normal. Neurologic: Grossly normal Incision/Wound: as per above  Lab Results:   Recent Labs  10/09/14 0803 10/11/14 0337  WBC 6.0 18.2*  HGB 8.7* 9.8*  HCT 29.9* 34.4*  PLT 369 482*   BMET  Recent Labs  10/09/14 0803 10/11/14 0337  NA 142 137  K 4.3 5.8*  CL 104  99  CO2 24 18*  GLUCOSE 89 170*  BUN 9 12  CREATININE 0.90 1.23*  CALCIUM 9.0 8.6   PT/INR No results for input(s): LABPROT, INR in the last 72 hours. ABG No results for input(s): PHART, HCO3 in the last 72 hours.  Invalid input(s): PCO2, PO2  Studies/Results: No results found.  Anti-infectives: Anti-infectives    Start     Dose/Rate Route Frequency Ordered Stop   10/10/14 2200  cefoTEtan (CEFOTAN) 2 g in dextrose 5 % 50 mL IVPB     2 g100 mL/hr over 30 Minutes Intravenous Every 12 hours 10/10/14 1819 10/10/14 2147   10/10/14 0600  cefoTEtan (CEFOTAN) 2 g in dextrose 5 % 50 mL IVPB     2 g100 mL/hr over 30 Minutes Intravenous On call to O.R. 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 OK to DC foley anytime. Slight Cr bump likely transient pre-renal peri-op.   Will follow.   [facility], Allison Mitchell 10/11/2014

## 2014-10-11 NOTE — Progress Notes (Signed)
INITIAL NUTRITION ASSESSMENT  DOCUMENTATION CODES Per approved criteria  -Obesity Unspecified   INTERVENTION: Supplement diet as appropriate   NUTRITION DIAGNOSIS: Inadequate oral intake related to inability to eat as evidenced by NPO status  Goal: Pt to meet >/= 90% of their estimated nutrition needs   Monitor:  Diet advancement, PO intake, supplement acceptance, labs  Reason for Assessment: Pt identified as at nutrition risk on the Malnutrition Screen Tool  53 y.o. female  Admitting Dx: Abdominal Pain and nausea and vomiting  ASSESSMENT: Pt is now s/p right colectomy with ileocolonic anastomosis with sigmoid colectomy and colostomy due to stage III adenocarcinoma, final pathology pending. Pt also with right ureteral obstruction now s/p stenting.  Per pt no recent weight changes. Appetite good PTA. No signs of fat or muscle depletion noted on exam. Pt sleepy with NG tube in place. Per RN remains NPO for now as she is just POD #1.   Height: Ht Readings from Last 1 Encounters:  10/02/14 5\' 8"  (1.727 m)    Weight: Wt Readings from Last 1 Encounters:  10/02/14 227 lb (102.967 kg)    Ideal Body Weight: 63.6 kg   % Ideal Body Weight: 162%  Wt Readings from Last 10 Encounters:  10/02/14 227 lb (102.967 kg)    Usual Body Weight: 230 lb   % Usual Body Weight: 99%  BMI:  Body mass index is 34.52 kg/(m^2).  Estimated Nutritional Needs: Kcal: 1900-2100 Protein: 100-115 grams Fluid: > 1.9 L/day  Skin:  incisions  Diet Order: Diet NPO time specified Except for: Ice Chips  EDUCATION NEEDS: -No education needs identified at this time   Intake/Output Summary (Last 24 hours) at 10/11/14 1116 Last data filed at 10/11/14 0511  Gross per 24 hour  Intake 3945.66 ml  Output   2505 ml  Net 1440.66 ml    Last BM: none recorded via colostomy  Labs:   Recent Labs Lab 10/08/14 0743 10/09/14 0803 10/11/14 0337  NA 141 142 137  K 4.0 4.3 5.8*  CL 103 104 99   CO2 23 24 18*  BUN 10 9 12   CREATININE 0.93 0.90 1.23*  CALCIUM 9.0 9.0 8.6  GLUCOSE 80 89 170*    CBG (last 3)  No results for input(s): GLUCAP in the last 72 hours.  Scheduled Meds: . antiseptic oral rinse  7 mL Mouth Rinse BID  . enoxaparin (LOVENOX) injection  40 mg Subcutaneous Q24H  . HYDROmorphone PCA 0.3 mg/mL   Intravenous 6 times per day  . pantoprazole (PROTONIX) IV  40 mg Intravenous QHS    Continuous Infusions: . sodium chloride 125 mL/hr at 10/11/14 0511  . lactated ringers 10 mL/hr at 10/10/14 1142    Past Medical History  Diagnosis Date  . GERD (gastroesophageal reflux disease)   . Depression     Past Surgical History  Procedure Laterality Date  . Cystoscopy with retrograde pyelogram, ureteroscopy and stent placement Right 10/05/2014    Procedure: [place], URETEROSCOPY  AND STENT PLACEMENT;  Surgeon: Alexis Frock, MD;  Location: [facility];  Service: Urology;  Laterality: Right;    [place], [place], [place] Pager [phone] After Hours Pager

## 2014-10-12 LAB — BASIC METABOLIC PANEL
Anion gap: 13 (ref 5–15)
BUN: 16 mg/dL (ref 6–23)
CHLORIDE: 100 meq/L (ref 96–112)
CO2: 21 meq/L (ref 19–32)
CREATININE: 0.87 mg/dL (ref 0.50–1.10)
Calcium: 8.4 mg/dL (ref 8.4–10.5)
GFR calc Af Amer: 87 mL/min — ABNORMAL LOW (ref >90)
GFR calc non Af Amer: 75 mL/min — ABNORMAL LOW (ref >90)
GLUCOSE: 133 mg/dL — AB (ref 70–99)
Potassium: 5 mEq/L (ref 3.7–5.3)
Sodium: 134 mEq/L — ABNORMAL LOW (ref 137–147)

## 2014-10-12 LAB — TYPE AND SCREEN
ABO/RH(D): A POS
Antibody Screen: NEGATIVE
Unit division: 0
Unit division: 0

## 2014-10-12 LAB — CBC
HEMATOCRIT: 28.2 % — AB (ref 36.0–46.0)
HEMOGLOBIN: 8.1 g/dL — AB (ref 12.0–15.0)
MCH: 21.4 pg — ABNORMAL LOW (ref 26.0–34.0)
MCHC: 28.7 g/dL — ABNORMAL LOW (ref 30.0–36.0)
MCV: 74.4 fL — AB (ref 78.0–100.0)
Platelets: 298 10*3/uL (ref 150–400)
RBC: 3.79 MIL/uL — ABNORMAL LOW (ref 3.87–5.11)
RDW: 18.7 % — ABNORMAL HIGH (ref 11.5–15.5)
WBC: 12.8 10*3/uL — AB (ref 4.0–10.5)

## 2014-10-12 NOTE — Progress Notes (Signed)
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 No gross direct ureteral involvement, final path pending. Had Rt JJ stent placed 12/3.   Today "Allison Mitchell" is stable. Still NPO with NGT and little colostomy output. Pain controlled, ambulatory.   Objective: Vital signs in last 24 hours: Temp:  [98.1 F (36.7 C)-99.3 F (37.4 C)] 98.6 F (37 C) (12/10 1315) Pulse Rate:  [91-97] 94 (12/10 1315) Resp:  [10-20] 18 (12/10 1600) BP: (111-132)/(48-67) 132/56 mmHg (12/10 1315) SpO2:  [93 %-97 %] 93 % (12/10 1600) Last BM Date: 10/09/14  Intake/Output from previous day: 12/09 0701 - 12/10 0700 In: 1125 [I.V.:1125] Out: 910 [Urine:775; Emesis/NG output:110; Stool:25] Intake/Output this shift: Total I/O In: 1266.7 [I.V.:1266.7] Out: 815 [Urine:675; Emesis/NG output:90; Stool:50]  General appearance: alert, cooperative and appears stated age Nose: Nares normal. Septum midline. Mucosa normal. No drainage or sinus tenderness., NGT with biliuos fluid Throat: lips, mucosa, and tongue normal; teeth and gums normal Back: symmetric, no curvature. ROM normal. No CVA tenderness. Resp: non-labored Cardio: Nl rate GI: soft, non-tender; bowel sounds normal; no masses,  no organomegaly and colosomy with minimal output, no gas. Edges pink.  Extremities: extremities normal, atraumatic, no cyanosis or edema Pulses: 2+ and symmetric Skin: Skin color, texture, turgor normal. No rashes or lesions Lymph nodes: Cervical, supraclavicular, and axillary nodes normal. Neurologic: Grossly normal  Lab Results:   Recent Labs  10/11/14 0337 10/12/14 0430  WBC 18.2* 12.8*  HGB 9.8* 8.1*  HCT 34.4* 28.2*  PLT 482* 298   BMET  Recent Labs  10/11/14 0337 10/11/14 1156 10/12/14 0430  NA 137  --  134*  K 5.8* 4.9 5.0  CL 99  --  100  CO2 18*  --  21  GLUCOSE 170*  --   133*  BUN 12  --  16  CREATININE 1.23*  --  0.87  CALCIUM 8.6  --  8.4   PT/INR No results for input(s): LABPROT, INR in the last 72 hours. ABG No results for input(s): PHART, HCO3 in the last 72 hours.  Invalid input(s): PCO2, PO2  Studies/Results: No results found.  Anti-infectives: Anti-infectives    Start     Dose/Rate Route Frequency Ordered Stop   10/10/14 2200  cefoTEtan (CEFOTAN) 2 g in dextrose 5 % 50 mL IVPB     2 g100 mL/hr over 30 Minutes Intravenous Every 12 hours 10/10/14 1819 10/10/14 2147   10/10/14 0600  cefoTEtan (CEFOTAN) 2 g in dextrose 5 % 50 mL IVPB     2 g100 mL/hr over 30 Minutes Intravenous On call to O.R. 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 No specific GU interventions at this point. She will need GU follow-up at discharge for  outpatient cysto stent pull in office.  Will follow.   [facility], Laronda Lisby 10/12/2014

## 2014-10-12 NOTE — Plan of Care (Signed)
Problem: Phase I Progression Outcomes Goal: Pain controlled with appropriate interventions Outcome: Completed/Met Date Met:  10/12/14 Goal: Stoma viable/dressing dry/intact Outcome: Completed/Met Date Met:  10/12/14 Goal: If new ostomy, consult WOC nurse Outcome: Completed/Met Date Met:  10/12/14  Problem: Phase III Progression Outcomes Goal: Foley discontinued if ordered Outcome: Completed/Met Date Met:  10/12/14

## 2014-10-12 NOTE — Progress Notes (Signed)
LOS: 9 days   Subjective: POD # 2, s/p right colectomy with ileocolonic anastomosis with sigmoid colectomy and colostomy. Awake and alert this morning and reports pain to be under good control. No nausea. Is very anxious to have NGT removed. Would like to be OOB.  Objective: Vital signs in last 24 hours: Temp:  [98.1 F (36.7 C)-98.7 F (37.1 C)] 98.2 F (36.8 C) (12/10 0630) Pulse Rate:  [91-97] 91 (12/10 0630) Resp:  [10-20] 20 (12/10 0800) BP: (111-120)/(48-67) 120/55 mmHg (12/10 0630) SpO2:  [93 %-97 %] 97 % (12/10 0800) Last BM Date: 10/09/14   Laboratory  CBC  Recent Labs  10/11/14 0337 10/12/14 0430  WBC 18.2* 12.8*  HGB 9.8* 8.1*  HCT 34.4* 28.2*  PLT 482* 298   BMET  Recent Labs  10/11/14 0337 10/11/14 1156 10/12/14 0430  NA 137  --  134*  K 5.8* 4.9 5.0  CL 99  --  100  CO2 18*  --  21  GLUCOSE 170*  --  133*  BUN 12  --  16  CREATININE 1.23*  --  0.87  CALCIUM 8.6  --  8.4     Physical Exam General appearance: alert, cooperative and anxious Resp: clear to auscultation bilaterally Cardio: regular rate and rhythm GI: soft, mild expected incisional tenderness, non-distended, quiet. Stoma pink. No output yet other than trace amount of slightly bloody fluid. Incision/Wound: midline incision clean, dry, intact with staples and honeycomb dressing.    Assessment/Plan: POD#2 s/p right colectomy with ileocolonic anastomosis with sigmoid colectomy and colostomy. Will D/C foley today and encourage OOB today. Awaiting clinical evidence of return of bowel function. Will discuss patient's anxiety about NGT and possible removal with Dr. Hulen Skains.  1. Stage 3 adenocarcinoma: oncology consult when surg path available 2. Right ureteral obstruction, s/p stenting. UO 775cc over past 24 hours.  3. Mild ARI: creatinine now normal at 0.87 4. Leukocytosis: improving. WBC now 12.8 5. Hyperkalemia: improved. K+ now 5.0 today.    [place] Rocker, [facility] Pager [phone] 10/12/2014

## 2014-10-12 NOTE — Consult Note (Signed)
WOC ostomy follow up Stoma type/location: Pt feeling poorly and still has NG, but current pouch is leaking near abd wound dressing and must be changed.   Stomal assessment/size: Stoma red and viable, slightly above skin level, 1 3/4 inches  Peristomal assessment: Intact skin surrounding.  Deep crease occurs at 9:00 o'clock when patient sits up, creating difficult pouching situation. Treatment options for stomal/peristomal skin:  Output 50cc pink drainage in pouch, no stool or flatus Ostomy pouching: Current 2 piece pouch is leaking behind barrier.   Education provided:  Demonstrated pouch change using one piece flexible pouch with barrier ring to maintain seal.  Pt watched and asked appropriate questions.  She is able to open and close velcro to empty.  Supplies at bedside for staff use.  Husband and daughter were not present for teaching session. Recommend home health assistance after discharge. Discussed pouching routines and educational materials left at bedside. Julien Girt MSN, RN, [place], [place], [place]

## 2014-10-13 LAB — BASIC METABOLIC PANEL
Anion gap: 11 (ref 5–15)
BUN: 9 mg/dL (ref 6–23)
CALCIUM: 8.1 mg/dL — AB (ref 8.4–10.5)
CO2: 25 mEq/L (ref 19–32)
Chloride: 104 mEq/L (ref 96–112)
Creatinine, Ser: 0.61 mg/dL (ref 0.50–1.10)
GFR calc Af Amer: >90 mL/min (ref >90)
Glucose, Bld: 109 mg/dL — ABNORMAL HIGH (ref 70–99)
Potassium: 3.9 mEq/L (ref 3.7–5.3)
Sodium: 140 mEq/L (ref 137–147)

## 2014-10-13 LAB — CBC
HCT: 23.9 % — ABNORMAL LOW (ref 36.0–46.0)
HEMOGLOBIN: 7.1 g/dL — AB (ref 12.0–15.0)
MCH: 21.2 pg — AB (ref 26.0–34.0)
MCHC: 29.7 g/dL — AB (ref 30.0–36.0)
MCV: 71.3 fL — AB (ref 78.0–100.0)
Platelets: 302 10*3/uL (ref 150–400)
RBC: 3.35 MIL/uL — ABNORMAL LOW (ref 3.87–5.11)
RDW: 19.2 % — ABNORMAL HIGH (ref 11.5–15.5)
WBC: 12.6 10*3/uL — ABNORMAL HIGH (ref 4.0–10.5)

## 2014-10-13 MED ORDER — HYDROMORPHONE HCL 1 MG/ML IJ SOLN
0.5000 mg | INTRAMUSCULAR | Status: DC | PRN
  Administered 2014-10-14: 0.5 mg via INTRAVENOUS
  Administered 2014-10-14 – 2014-10-17 (×4): 1 mg via INTRAVENOUS
  Filled 2014-10-13 (×6): qty 1

## 2014-10-13 MED ORDER — HYDROCODONE-ACETAMINOPHEN 5-325 MG PO TABS
1.0000 | ORAL_TABLET | ORAL | Status: DC | PRN
  Administered 2014-10-15 – 2014-10-18 (×9): 2 via ORAL
  Filled 2014-10-13 (×10): qty 2

## 2014-10-13 MED ORDER — ALUM & MAG HYDROXIDE-SIMETH 200-200-20 MG/5ML PO SUSP
30.0000 mL | Freq: Once | ORAL | Status: AC
  Administered 2014-10-13: 30 mL via ORAL
  Filled 2014-10-13: qty 30

## 2014-10-13 NOTE — Consult Note (Addendum)
WOC follow-up: Pt still feeling poorly with NG intact.  Pouch applied yesterday is intact with good seal; no further leakage since barrier ring was applied.  No stool or flatus in pouch at this time, small amt pink drainage. Stoma dark red when visualized through pouch. Supplies at bedside for staff nurse use.  Pt declines offer for pouch change or further education today; will plan to perform another pouch change demonstration on Monday. Placed on [place] discharge program. Recommend home health assistance after discharge. Julien Girt MSN, RN, [place], [place], [place]

## 2014-10-13 NOTE — Progress Notes (Signed)
[facility] Progress Note  3 Days Post-Op  Subjective: Pt doing well.  No significant pain.  No N/V, tolerating ice chips.  Ambulating OOB.  Wants NG out.    Objective: Vital signs in last 24 hours: Temp:  [98 F (36.7 C)-99.6 F (37.6 C)] 98.4 F (36.9 C) (12/11 0552) Pulse Rate:  [89-107] 89 (12/11 0552) Resp:  [16-19] 18 (12/11 0552) BP: (116-151)/(53-65) 139/65 mmHg (12/11 0552) SpO2:  [93 %-97 %] 97 % (12/11 0552) Last BM Date: 10/09/14  Intake/Output from previous day: 12/10 0701 - 12/11 0700 In: 3106.7 [P.O.:30; I.V.:3076.7] Out: 1340 [Urine:1075; Emesis/NG output:215; Stool:50] Intake/Output this shift: Total I/O In: -  Out: 650 [Urine:650]  PE: Gen:  Alert, NAD, pleasant Pulm:  IS to 1250 Abd: Soft, NT/ND, +BS, no HSM, incisions C/D/I, ostomy pink, but looks receded, some flatus in bag, no BM yet, NG output was 219mL/24hr.   Lab Results:   Recent Labs  10/11/14 0337 10/12/14 0430  WBC 18.2* 12.8*  HGB 9.8* 8.1*  HCT 34.4* 28.2*  PLT 482* 298   BMET  Recent Labs  10/11/14 0337 10/11/14 1156 10/12/14 0430  NA 137  --  134*  K 5.8* 4.9 5.0  CL 99  --  100  CO2 18*  --  21  GLUCOSE 170*  --  133*  BUN 12  --  16  CREATININE 1.23*  --  0.87  CALCIUM 8.6  --  8.4   PT/INR No results for input(s): LABPROT, INR in the last 72 hours. CMP     Component Value Date/Time   NA 134* 10/12/2014 0430   K 5.0 10/12/2014 0430   CL 100 10/12/2014 0430   CO2 21 10/12/2014 0430   GLUCOSE 133* 10/12/2014 0430   BUN 16 10/12/2014 0430   CREATININE 0.87 10/12/2014 0430   CALCIUM 8.4 10/12/2014 0430   GFRNONAA 75* 10/12/2014 0430   GFRAA 87* 10/12/2014 0430   Lipase  No results found for: LIPASE     Studies/Results: No results found.  Anti-infectives: Anti-infectives    Start     Dose/Rate Route Frequency Ordered Stop   10/10/14 2200  cefoTEtan (CEFOTAN) 2 g in dextrose 5 % 50 mL IVPB     2 g100 mL/hr over 30 Minutes Intravenous  Every 12 hours 10/10/14 1819 10/10/14 2147   10/10/14 0600  cefoTEtan (CEFOTAN) 2 g in dextrose 5 % 50 mL IVPB     2 g100 mL/hr over 30 Minutes Intravenous On call to O.R. 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  Right ureteral obstruction, s/p stenting. UO 1075cc over past 24 hours.  Mild ARI - resolved Leukocytosis - yesterday was 12.8 Hyperkalemia - resolved yesterday   Plan: 1. Foley d/c yesterday, clamp NG tube and start clamping trials.  Allow sips of clears.  May be able to d/c NG later today. 2. OOB, IS 3. SCD's and lovenox 4. Await improved bowel function prior to advancing diet 5. D/c pca and start IVP dilaudid 6. Repeat labs    LOS: 10 days    Coralie Keens 10/13/2014, 8:33 AM Pager: [phone]

## 2014-10-14 LAB — BASIC METABOLIC PANEL
ANION GAP: 16 — AB (ref 5–15)
BUN: 8 mg/dL (ref 6–23)
CALCIUM: 8.3 mg/dL — AB (ref 8.4–10.5)
CO2: 19 mEq/L (ref 19–32)
CREATININE: 0.6 mg/dL (ref 0.50–1.10)
Chloride: 109 mEq/L (ref 96–112)
GFR calc Af Amer: >90 mL/min (ref >90)
GFR calc non Af Amer: >90 mL/min (ref >90)
Glucose, Bld: 88 mg/dL (ref 70–99)
Potassium: 3.6 mEq/L — ABNORMAL LOW (ref 3.7–5.3)
Sodium: 144 mEq/L (ref 137–147)

## 2014-10-14 LAB — CBC
HCT: 22.1 % — ABNORMAL LOW (ref 36.0–46.0)
Hemoglobin: 6.6 g/dL — CL (ref 12.0–15.0)
MCH: 21.5 pg — AB (ref 26.0–34.0)
MCHC: 29.9 g/dL — AB (ref 30.0–36.0)
MCV: 72 fL — AB (ref 78.0–100.0)
Platelets: 301 10*3/uL (ref 150–400)
RBC: 3.07 MIL/uL — ABNORMAL LOW (ref 3.87–5.11)
RDW: 19 % — AB (ref 11.5–15.5)
WBC: 9.4 10*3/uL (ref 4.0–10.5)

## 2014-10-14 LAB — PREPARE RBC (CROSSMATCH)

## 2014-10-14 MED ORDER — SODIUM CHLORIDE 0.9 % IV SOLN
Freq: Once | INTRAVENOUS | Status: AC
  Administered 2014-10-14: 11:00:00 via INTRAVENOUS

## 2014-10-14 MED ORDER — CITALOPRAM HYDROBROMIDE 40 MG PO TABS
40.0000 mg | ORAL_TABLET | Freq: Every day | ORAL | Status: DC
  Administered 2014-10-17 – 2014-10-18 (×2): 40 mg via ORAL
  Filled 2014-10-14 (×2): qty 1
  Filled 2014-10-14: qty 2
  Filled 2014-10-14 (×3): qty 1

## 2014-10-14 NOTE — Progress Notes (Signed)
CRITICAL VALUE ALERT   Critical value received:   Hemoglobin-- 6.6  Date of notification:  10/14/2014  Time of notification:  0543  Critical value read back:Yes.    Nurse who received alert:  Thad Ranger  MD notified (1st page):  Anne Hahn Ramirez,MD  Time of first page:  [phone]  MD notified (2nd page): Anne Hahn Ramirez,MD  Time of second page: [phone]  Responding MD:  Anne Hahn Ramirez,MD  Time MD responded:  [phone]

## 2014-10-14 NOTE — Progress Notes (Signed)
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 No gross direct ureteral involvement, final path pending. Had Rt JJ stent placed 12/3.   Today "Allison Mitchell" is stable. Voiding well, reports urine is non-bloody.  Is receiving blood transfusion today for low HGB.  Pain overall improving.  No new flank pain.  Objective: Vital signs in last 24 hours: Temp:  [98.4 F (36.9 C)-98.8 F (37.1 C)] 98.5 F (36.9 C) (12/12 0630) Pulse Rate:  [73-93] 73 (12/12 0630) Resp:  [16-17] 16 (12/12 0630) BP: (126-148)/(52-58) 126/58 mmHg (12/12 0630) SpO2:  [98 %-100 %] 98 % (12/12 0630) Last BM Date: 10/09/14  Intake/Output from previous day: 12/11 0701 - 12/12 0700 In: 2120 [P.O.:120; I.V.:2000] Out: 2600 [Urine:2600] Intake/Output this shift:    General appearance: alert, cooperative and appears stated age Nose: Nares normal. Septum midline. Mucosa normal. No drainage or sinus tenderness., NGT with biliuos fluid Throat: lips, mucosa, and tongue normal; teeth and gums normal Back: symmetric, no curvature. ROM normal. No CVA tenderness. Resp: non-labored Cardio: Nl rate GI: soft, non-tender; bowel sounds normal; no masses,  no organomegaly and colosomy with minimal output, no gas. Edges pink.  Extremities: extremities normal, atraumatic, no cyanosis or edema Pulses: 2+ and symmetric Skin: Skin color, texture, turgor normal. No rashes or lesions Lymph nodes: Cervical, supraclavicular, and axillary nodes normal. Neurologic: Grossly normal  Lab Results:   Recent Labs  10/13/14 0915 10/14/14 0436  WBC 12.6* 9.4  HGB 7.1* 6.6*  HCT 23.9* 22.1*  PLT 302 301   BMET  Recent Labs  10/13/14 0915 10/14/14 0436  NA 140 144  K 3.9 3.6*  CL 104 109  CO2 25 19  GLUCOSE 109* 88  BUN 9 8  CREATININE 0.61 0.60  CALCIUM 8.1* 8.3*   PT/INR No results for  input(s): LABPROT, INR in the last 72 hours. ABG No results for input(s): PHART, HCO3 in the last 72 hours.  Invalid input(s): PCO2, PO2  Studies/Results: No results found.  Anti-infectives: Anti-infectives    Start     Dose/Rate Route Frequency Ordered Stop   10/10/14 2200  cefoTEtan (CEFOTAN) 2 g in dextrose 5 % 50 mL IVPB     2 g100 mL/hr over 30 Minutes Intravenous Every 12 hours 10/10/14 1819 10/10/14 2147   10/10/14 0600  cefoTEtan (CEFOTAN) 2 g in dextrose 5 % 50 mL IVPB     2 g100 mL/hr over 30 Minutes Intravenous On call to O.R. 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  No further GU interventions at this point. She will need GU follow-up at discharge for outpatient cysto stent pull in office.  Will follow.   Baltazar Najjar,  Will 10/14/2014

## 2014-10-14 NOTE — Progress Notes (Signed)
Patient ID: Allison Mitchell, female   DOB: 1961/02/17, 53 y.o.   MRN: [id number] 4 Days Post-Op  Subjective: Feels a little weak and tired but no major complaints. Sipping on clear liquids without nausea. Denies abdominal pain.  Objective: Vital signs in last 24 hours: Temp:  [98.4 F (36.9 C)-98.8 F (37.1 C)] 98.5 F (36.9 C) (12/12 0630) Pulse Rate:  [73-93] 73 (12/12 0630) Resp:  [16-17] 16 (12/12 0630) BP: (126-148)/(52-58) 126/58 mmHg (12/12 0630) SpO2:  [98 %-100 %] 98 % (12/12 0630) Last BM Date: 10/09/14  Intake/Output from previous day: 12/11 0701 - 12/12 0700 In: 2120 [P.O.:120; I.V.:2000] Out: 2600 [Urine:2600] Intake/Output this shift:    General appearance: alert, cooperative and no distress GI: normal findings: soft, non-tender and nondistended.. Small amount of stool in colostomy bag Incision/Wound: no erythema or drainage  Lab Results:   Recent Labs  10/13/14 0915 10/14/14 0436  WBC 12.6* 9.4  HGB 7.1* 6.6*  HCT 23.9* 22.1*  PLT 302 301   BMET  Recent Labs  10/13/14 0915 10/14/14 0436  NA 140 144  K 3.9 3.6*  CL 104 109  CO2 25 19  GLUCOSE 109* 88  BUN 9 8  CREATININE 0.61 0.60  CALCIUM 8.1* 8.3*     Studies/Results: No results found.  Anti-infectives: Anti-infectives    Start     Dose/Rate Route Frequency Ordered Stop   10/10/14 2200  cefoTEtan (CEFOTAN) 2 g in dextrose 5 % 50 mL IVPB     2 g100 mL/hr over 30 Minutes Intravenous Every 12 hours 10/10/14 1819 10/10/14 2147   10/10/14 0600  cefoTEtan (CEFOTAN) 2 g in dextrose 5 % 50 mL IVPB     2 g100 mL/hr over 30 Minutes Intravenous On call to O.R. 10/09/14 0825 10/10/14 0607   10/05/14 0700  gentamicin (GARAMYCIN) IVPB 80 mg     80 mg100 mL/hr over 30 Minutes Intravenous 30 min pre-op 10/04/14 1103 10/05/14 1300   10/03/14 0200  ciprofloxacin (CIPRO) IVPB 400 mg  Status:  Discontinued     400 mg200 mL/hr over 60 Minutes Intravenous Every 12 hours 10/03/14 0055 10/05/14 1416   10/03/14 0200  metroNIDAZOLE (FLAGYL) IVPB 500 mg  Status:  Discontinued     500 mg100 mL/hr over 60 Minutes Intravenous Every 8 hours 10/03/14 0055 10/05/14 1416      Assessment/Plan: s/p Procedure(s): EXPLORATORY LAPAROTOMY OPEN RIGHT URETERAL LYSIS  PARTIAL SIGMOID COLECTOMY COLOSTOMY PARTIAL RIGHT COLECTOMY Blood loss anemia-receiving 1 unit PRBCs Generally doing well. Start clear liquid tray.   LOS: 11 days    Yavuz Kirby T 10/14/2014

## 2014-10-15 LAB — CBC
HCT: 25.5 % — ABNORMAL LOW (ref 36.0–46.0)
Hemoglobin: 7.9 g/dL — ABNORMAL LOW (ref 12.0–15.0)
MCH: 22.6 pg — ABNORMAL LOW (ref 26.0–34.0)
MCHC: 31 g/dL (ref 30.0–36.0)
MCV: 73.1 fL — AB (ref 78.0–100.0)
PLATELETS: 325 10*3/uL (ref 150–400)
RBC: 3.49 MIL/uL — ABNORMAL LOW (ref 3.87–5.11)
RDW: 19.6 % — AB (ref 11.5–15.5)
WBC: 8.5 10*3/uL (ref 4.0–10.5)

## 2014-10-15 LAB — TYPE AND SCREEN
ABO/RH(D): A POS
Antibody Screen: NEGATIVE
UNIT DIVISION: 0

## 2014-10-15 NOTE — Progress Notes (Signed)
Patient ID: Allison Mitchell, female   DOB: 10-06-1961, 53 y.o.   MRN: [id number] 5 Days Post-Op  Subjective: Feeling better today. Tolerating clear liquids without difficulty. Colostomy has been functioning.  Objective: Vital signs in last 24 hours: Temp:  [98.2 F (36.8 C)-99.5 F (37.5 C)] 98.2 F (36.8 C) (12/13 0656) Pulse Rate:  [68-92] 72 (12/13 0656) Resp:  [17-20] 17 (12/13 0656) BP: (134-147)/(49-68) 136/68 mmHg (12/13 0656) SpO2:  [97 %-100 %] 99 % (12/13 0656) Last BM Date: 10/14/14  Intake/Output from previous day: 12/12 0701 - 12/13 0700 In: 2828.8 [P.O.:440; I.V.:2053.8; Blood:335] Out: 7829 [Urine:1800; Stool:50] Intake/Output this shift:    General appearance: alert, cooperative and no distress GI: normal findings: soft, non-tender Incision/Wound: no erythema or unusual drainage  Lab Results:   Recent Labs  10/14/14 0436 10/15/14 0400  WBC 9.4 8.5  HGB 6.6* 7.9*  HCT 22.1* 25.5*  PLT 301 325   BMET  Recent Labs  10/13/14 0915 10/14/14 0436  NA 140 144  K 3.9 3.6*  CL 104 109  CO2 25 19  GLUCOSE 109* 88  BUN 9 8  CREATININE 0.61 0.60  CALCIUM 8.1* 8.3*     Studies/Results: No results found.  Anti-infectives: Anti-infectives    Start     Dose/Rate Route Frequency Ordered Stop   10/10/14 2200  cefoTEtan (CEFOTAN) 2 g in dextrose 5 % 50 mL IVPB     2 g100 mL/hr over 30 Minutes Intravenous Every 12 hours 10/10/14 1819 10/10/14 2147   10/10/14 0600  cefoTEtan (CEFOTAN) 2 g in dextrose 5 % 50 mL IVPB     2 g100 mL/hr over 30 Minutes Intravenous On call to O.R. 10/09/14 0825 10/10/14 0607   10/05/14 0700  gentamicin (GARAMYCIN) IVPB 80 mg     80 mg100 mL/hr over 30 Minutes Intravenous 30 min pre-op 10/04/14 1103 10/05/14 1300   10/03/14 0200  ciprofloxacin (CIPRO) IVPB 400 mg  Status:  Discontinued     400 mg200 mL/hr over 60 Minutes Intravenous Every 12 hours 10/03/14 0055 10/05/14 1416   10/03/14 0200  metroNIDAZOLE (FLAGYL) IVPB 500 mg   Status:  Discontinued     500 mg100 mL/hr over 60 Minutes Intravenous Every 8 hours 10/03/14 0055 10/05/14 1416      Assessment/Plan: s/p Procedure(s): EXPLORATORY LAPAROTOMY OPEN RIGHT URETERAL LYSIS  PARTIAL SIGMOID COLECTOMY COLOSTOMY PARTIAL RIGHT COLECTOMY Hemoglobin improved post transfusion Making good progress Advanced to full liquid diet   LOS: 12 days    Allison Mitchell 10/15/2014

## 2014-10-16 MED ORDER — PANTOPRAZOLE SODIUM 40 MG PO TBEC
40.0000 mg | DELAYED_RELEASE_TABLET | Freq: Every day | ORAL | Status: DC
  Administered 2014-10-16 – 2014-10-18 (×3): 40 mg via ORAL
  Filled 2014-10-16 (×2): qty 1

## 2014-10-16 NOTE — Progress Notes (Signed)
Patient ID: Allison Mitchell, female   DOB: January 17, 1961, 53 y.o.   MRN: [id number] 6 Days Post-Op  Subjective: Pt doing ok, sick on her stomach from the site of her stoma at ostomy change this morning.  Tolerating full liquids today.  Objective: Vital signs in last 24 hours: Temp:  [98.3 F (36.8 C)-98.4 F (36.9 C)] 98.4 F (36.9 C) (12/14 0554) Pulse Rate:  [72-78] 72 (12/14 0554) Resp:  [17-18] 18 (12/14 0554) BP: (125-134)/(56-61) 134/56 mmHg (12/14 0554) SpO2:  [96 %-100 %] 100 % (12/14 0554) Last BM Date: 10/16/14  Intake/Output from previous day: 12/13 0701 - 12/14 0700 In: 817.5 [I.V.:817.5] Out: 1700 [Urine:1700] Intake/Output this shift:    PE: Abd: soft, appropriately tender, stoma is dusky and partially necrotic superficially, but viable, no current output as it was just changed.  Incision c/d/i with staples   Lab Results:   Recent Labs  10/14/14 0436 10/15/14 0400  WBC 9.4 8.5  HGB 6.6* 7.9*  HCT 22.1* 25.5*  PLT 301 325   BMET  Recent Labs  10/14/14 0436  NA 144  K 3.6*  CL 109  CO2 19  GLUCOSE 88  BUN 8  CREATININE 0.60  CALCIUM 8.3*   PT/INR No results for input(s): LABPROT, INR in the last 72 hours. CMP     Component Value Date/Time   NA 144 10/14/2014 0436   K 3.6* 10/14/2014 0436   CL 109 10/14/2014 0436   CO2 19 10/14/2014 0436   GLUCOSE 88 10/14/2014 0436   BUN 8 10/14/2014 0436   CREATININE 0.60 10/14/2014 0436   CALCIUM 8.3* 10/14/2014 0436   GFRNONAA >90 10/14/2014 0436   GFRAA >90 10/14/2014 0436   Lipase  No results found for: LIPASE     Studies/Results: No results found.  Anti-infectives: Anti-infectives    Start     Dose/Rate Route Frequency Ordered Stop   10/10/14 2200  cefoTEtan (CEFOTAN) 2 g in dextrose 5 % 50 mL IVPB     2 g100 mL/hr over 30 Minutes Intravenous Every 12 hours 10/10/14 1819 10/10/14 2147   10/10/14 0600  cefoTEtan (CEFOTAN) 2 g in dextrose 5 % 50 mL IVPB     2 g100 mL/hr over 30 Minutes  Intravenous On call to O.R. 10/09/14 0825 10/10/14 0607   10/05/14 0700  gentamicin (GARAMYCIN) IVPB 80 mg     80 mg100 mL/hr over 30 Minutes Intravenous 30 min pre-op 10/04/14 1103 10/05/14 1300   10/03/14 0200  ciprofloxacin (CIPRO) IVPB 400 mg  Status:  Discontinued     400 mg200 mL/hr over 60 Minutes Intravenous Every 12 hours 10/03/14 0055 10/05/14 1416   10/03/14 0200  metroNIDAZOLE (FLAGYL) IVPB 500 mg  Status:  Discontinued     500 mg100 mL/hr over 60 Minutes Intravenous Every 8 hours 10/03/14 0055 10/05/14 1416       Assessment/Plan  1. POD 6, s/p right colectomy with ileocolonic anastomosis with sigmoid colectomy and colostomy 2. 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 Right ureteral obstruction, s/p stenting 4. Mild ARI, follow 5. Leukocytosis, resolved 6. Hyperkalemia, resolved 7. Anemia, s/p 1 unit transfusion, stable   Plan: 1. Patient doing ok.  Tolerating full liquids. 2. Didn't tolerate her ostomy change well today as she is squeamish and it made her sick on her stomach.  3. Will adanvce to solid diet today 4. Pathology reviewed today and reviewed with the patient.  Will plan on meeting husband at 45 today to discuss this with him.  [facility] (genetic testing is being done by pathology)  Will send Dr. Burr Medico and update her on pathology and current status. 5. She needs a port a cath.  Will try to place this prior to her discharge 6. HH arranged for routine ostomy  Once she is able to go home. 7. ? Oral iron for anemia, check labs in am     LOS: 13 days    Jameila Keeny E 10/16/2014, 11:28 AM Pager: [phone]

## 2014-10-16 NOTE — Consult Note (Signed)
WOC ostomy follow up Stoma type/location: Left upper quad colostomy Stomal assessment/size: Stoma red and viable, slightly above skin level, 1 3/4 inches  Peristomal assessment: Intact skin surrounding. Deep crease occurs at 9:00 o'clock when patient sits up, creating difficult pouching situation. Output Mod amt brown drainage in pouch, no stool, some flatus Ostomy pouching: Current 1 piece pouch is leaking behind barrier.  Education provided: Demonstrated pouch change using one piece flexible pouch with barrier ring to maintain seal. Pt assisted with pouch application and asked appropriate questions. She is able to open and close velcro to empty. Supplies at bedside for staff use. Husband and daughter were not present for teaching session. Recommend home health assistance after discharge. Discussed pouching routines and educational materials left at bedside.Has been placed on [facility] discharge program. Julien Girt MSN, RN, [place], [place], [place]

## 2014-10-16 NOTE — Care Management Note (Addendum)
  Page 1 of 1   10/16/2014     2:38:53 PM CARE MANAGEMENT NOTE 10/16/2014  Patient:  Allison Mitchell, Allison Mitchell   Account Number:  [id number]  Date Initiated:  10/10/2014  Documentation initiated by:  Magdalen Spatz  Subjective/Objective Assessment:     Action/Plan:   Anticipated DC Date:  10/17/2014   Anticipated DC Plan:  [place]         Choice offered to / List presented to:  Patient         [place] arranged  HH-1 RN      [place]   Status of service:  In process, will continue to follow Medicare Important Message given?   (If response is "NO", the following Medicare IM given date fields will be blank) Date Medicare IM given:   Medicare IM given by:   Date Additional Medicare IM given:   Additional Medicare IM given by:    Discharge Disposition:    Per UR Regulation:    If discussed at Long Length of Stay Meetings, dates discussed:   10/10/2014  10/12/2014    Comments:  10-16-14 Face sheet information confirmed with patient . Referral called and faxed to [facility] at [facility] phone [phone] , fax [phone] . If patient is discharged tomorrow 10-17-14 start of care will be 10-18-14 . Will keep Arbie Cookey at [facility] updated. Magdalen Spatz RN BSN [phone]

## 2014-10-17 ENCOUNTER — Inpatient Hospital Stay (HOSPITAL_COMMUNITY): Payer: BC Managed Care – PPO | Admitting: Anesthesiology

## 2014-10-17 ENCOUNTER — Inpatient Hospital Stay (HOSPITAL_COMMUNITY): Payer: BC Managed Care – PPO

## 2014-10-17 ENCOUNTER — Encounter (HOSPITAL_COMMUNITY)
Admission: AD | Disposition: A | Discharge status: Home / Self Care | Payer: Self-pay | Source: Other Acute Inpatient Hospital

## 2014-10-17 HISTORY — PX: PORTACATH PLACEMENT: SHX2246

## 2014-10-17 LAB — CBC
HCT: 28.7 % — ABNORMAL LOW (ref 36.0–46.0)
HEMOGLOBIN: 8.8 g/dL — AB (ref 12.0–15.0)
MCH: 22.2 pg — AB (ref 26.0–34.0)
MCHC: 30.7 g/dL (ref 30.0–36.0)
MCV: 72.5 fL — ABNORMAL LOW (ref 78.0–100.0)
Platelets: ADEQUATE 10*3/uL (ref 150–400)
RBC: 3.96 MIL/uL (ref 3.87–5.11)
RDW: 20.4 % — ABNORMAL HIGH (ref 11.5–15.5)
WBC: 8.4 10*3/uL (ref 4.0–10.5)

## 2014-10-17 SURGERY — INSERTION, TUNNELED CENTRAL VENOUS DEVICE, WITH PORT
Anesthesia: General

## 2014-10-17 MED ORDER — HEPARIN SOD (PORK) LOCK FLUSH 100 UNIT/ML IV SOLN
INTRAVENOUS | Status: DC | PRN
  Administered 2014-10-17: 500 [IU]

## 2014-10-17 MED ORDER — PROPOFOL 10 MG/ML IV BOLUS
INTRAVENOUS | Status: DC | PRN
  Administered 2014-10-17: 150 mg via INTRAVENOUS

## 2014-10-17 MED ORDER — PHENYLEPHRINE HCL 10 MG/ML IJ SOLN
INTRAMUSCULAR | Status: DC | PRN
  Administered 2014-10-17: 40 ug via INTRAVENOUS

## 2014-10-17 MED ORDER — FENTANYL CITRATE 0.05 MG/ML IJ SOLN
INTRAMUSCULAR | Status: DC | PRN
  Administered 2014-10-17: 100 ug via INTRAVENOUS
  Administered 2014-10-17: 25 ug via INTRAVENOUS

## 2014-10-17 MED ORDER — MIDAZOLAM HCL 5 MG/5ML IJ SOLN
INTRAMUSCULAR | Status: DC | PRN
  Administered 2014-10-17: 2 mg via INTRAVENOUS

## 2014-10-17 MED ORDER — ENOXAPARIN SODIUM 40 MG/0.4ML ~~LOC~~ SOLN
40.0000 mg | SUBCUTANEOUS | Status: DC
  Administered 2014-10-18: 40 mg via SUBCUTANEOUS
  Filled 2014-10-17: qty 0.4

## 2014-10-17 MED ORDER — LIDOCAINE HCL (CARDIAC) 20 MG/ML IV SOLN
INTRAVENOUS | Status: DC | PRN
  Administered 2014-10-17: 50 mg via INTRAVENOUS

## 2014-10-17 MED ORDER — PROMETHAZINE HCL 25 MG/ML IJ SOLN: 6.2500 mg | INTRAMUSCULAR | Status: DC | PRN

## 2014-10-17 MED ORDER — SODIUM CHLORIDE 0.9 % IR SOLN
Status: DC | PRN
  Administered 2014-10-17: 17:00:00

## 2014-10-17 MED ORDER — CEFAZOLIN SODIUM-DEXTROSE 2-3 GM-% IV SOLR
INTRAVENOUS | Status: DC | PRN
  Administered 2014-10-17: 2 g via INTRAVENOUS

## 2014-10-17 MED ORDER — BUPIVACAINE HCL (PF) 0.25 % IJ SOLN
INTRAMUSCULAR | Status: DC | PRN
  Administered 2014-10-17: 30 mL

## 2014-10-17 MED ORDER — PROPOFOL 10 MG/ML IV BOLUS
INTRAVENOUS | Status: AC
  Filled 2014-10-17: qty 20

## 2014-10-17 MED ORDER — LACTATED RINGERS IV SOLN
INTRAVENOUS | Status: DC | PRN
  Administered 2014-10-17: 16:00:00 via INTRAVENOUS

## 2014-10-17 MED ORDER — HYDROMORPHONE HCL 1 MG/ML IJ SOLN
0.2500 mg | INTRAMUSCULAR | Status: DC | PRN
Start: 2014-10-17 — End: 2014-10-17

## 2014-10-17 MED ORDER — BUPIVACAINE HCL (PF) 0.25 % IJ SOLN
INTRAMUSCULAR | Status: AC
  Filled 2014-10-17: qty 30

## 2014-10-17 MED ORDER — MIDAZOLAM HCL 2 MG/2ML IJ SOLN
INTRAMUSCULAR | Status: AC
  Filled 2014-10-17: qty 2

## 2014-10-17 MED ORDER — OXYCODONE HCL 5 MG/5ML PO SOLN: 5.0000 mg | Freq: Once | ORAL | Status: DC | PRN

## 2014-10-17 MED ORDER — OXYCODONE HCL 5 MG PO TABS: 5.0000 mg | ORAL_TABLET | Freq: Once | ORAL | Status: DC | PRN

## 2014-10-17 MED ORDER — HEPARIN SOD (PORK) LOCK FLUSH 100 UNIT/ML IV SOLN
INTRAVENOUS | Status: AC
  Filled 2014-10-17: qty 5

## 2014-10-17 MED ORDER — FENTANYL CITRATE 0.05 MG/ML IJ SOLN
INTRAMUSCULAR | Status: AC
  Filled 2014-10-17: qty 5

## 2014-10-17 SURGICAL SUPPLY — 45 items
BAG DECANTER FOR FLEXI CONT (MISCELLANEOUS) ×3 IMPLANT
BENZOIN TINCTURE PRP APPL 2/3 (GAUZE/BANDAGES/DRESSINGS) ×3 IMPLANT
BLADE SURG 15 STRL LF DISP TIS (BLADE) ×1 IMPLANT
BLADE SURG 15 STRL SS (BLADE) ×2
CHLORAPREP W/TINT 26ML (MISCELLANEOUS) ×3 IMPLANT
CLOSURE WOUND 1/2 X4 (GAUZE/BANDAGES/DRESSINGS) ×2
COVER SURGICAL LIGHT HANDLE (MISCELLANEOUS) ×3 IMPLANT
CRADLE DONUT ADULT HEAD (MISCELLANEOUS) ×3 IMPLANT
DRAPE C-ARM 42X72 X-RAY (DRAPES) ×3 IMPLANT
DRAPE CHEST BREAST 15X10 FENES (DRAPES) ×3 IMPLANT
DRAPE UTILITY XL STRL (DRAPES) ×6 IMPLANT
DRSG TEGADERM 4X4.75 (GAUZE/BANDAGES/DRESSINGS) ×3 IMPLANT
ELECT CAUTERY BLADE 6.4 (BLADE) ×3 IMPLANT
ELECT REM PT RETURN 9FT ADLT (ELECTROSURGICAL) ×3
ELECTRODE REM PT RTRN 9FT ADLT (ELECTROSURGICAL) ×1 IMPLANT
GAUZE SPONGE 2X2 8PLY NS (GAUZE/BANDAGES/DRESSINGS) ×3 IMPLANT
GAUZE SPONGE 2X2 8PLY STRL LF (GAUZE/BANDAGES/DRESSINGS) ×1 IMPLANT
GAUZE SPONGE 4X4 16PLY XRAY LF (GAUZE/BANDAGES/DRESSINGS) ×3 IMPLANT
GLOVE SURG SIGNA 7.5 PF LTX (GLOVE) ×3 IMPLANT
GOWN STRL REUS W/ TWL LRG LVL3 (GOWN DISPOSABLE) ×1 IMPLANT
GOWN STRL REUS W/ TWL XL LVL3 (GOWN DISPOSABLE) ×1 IMPLANT
GOWN STRL REUS W/TWL LRG LVL3 (GOWN DISPOSABLE) ×2
GOWN STRL REUS W/TWL XL LVL3 (GOWN DISPOSABLE) ×2
KIT BASIN OR (CUSTOM PROCEDURE TRAY) ×3 IMPLANT
KIT PORT POWER 8FR ISP CVUE (Catheter) ×3 IMPLANT
KIT REMOVER STAPLE SKIN (MISCELLANEOUS) ×3 IMPLANT
KIT ROOM TURNOVER OR (KITS) ×3 IMPLANT
NEEDLE HYPO 25GX1X1/2 BEV (NEEDLE) ×3 IMPLANT
NS IRRIG 1000ML POUR BTL (IV SOLUTION) ×3 IMPLANT
PACK SURGICAL SETUP 50X90 (CUSTOM PROCEDURE TRAY) ×3 IMPLANT
PAD ARMBOARD 7.5X6 YLW CONV (MISCELLANEOUS) ×3 IMPLANT
PENCIL BUTTON HOLSTER BLD 10FT (ELECTRODE) ×3 IMPLANT
SPONGE GAUZE 2X2 STER 10/PKG (GAUZE/BANDAGES/DRESSINGS) ×2
STRIP CLOSURE SKIN 1/2X4 (GAUZE/BANDAGES/DRESSINGS) ×4 IMPLANT
SUT MNCRL AB 4-0 PS2 18 (SUTURE) ×3 IMPLANT
SUT PROLENE 2 0 SH 30 (SUTURE) ×3 IMPLANT
SUT SILK 2 0 (SUTURE)
SUT SILK 2-0 18XBRD TIE 12 (SUTURE) IMPLANT
SUT VIC AB 3-0 SH 27 (SUTURE) ×2
SUT VIC AB 3-0 SH 27XBRD (SUTURE) ×1 IMPLANT
SYR 20ML ECCENTRIC (SYRINGE) ×6 IMPLANT
SYR 5ML LUER SLIP (SYRINGE) ×3 IMPLANT
SYR CONTROL 10ML LL (SYRINGE) ×3 IMPLANT
TOWEL OR 17X24 6PK STRL BLUE (TOWEL DISPOSABLE) ×3 IMPLANT
TOWEL OR 17X26 10 PK STRL BLUE (TOWEL DISPOSABLE) ×3 IMPLANT

## 2014-10-17 NOTE — Transfer of Care (Signed)
Immediate Anesthesia Transfer of Care Note  Patient: Allison Mitchell  Procedure(s) Performed: Procedure(s): INSERTION PORT-A-CATH LEFT SUBCLAVIAN AND MIDLINE INCISION STAPLE REMOVAL  (N/A)  Patient Location: PACU  Anesthesia Type:General  Level of Consciousness: awake, alert  and oriented  Airway & Oxygen Therapy: Patient Spontanous Breathing  Post-op Assessment: Report given to PACU RN and Post -op Vital signs reviewed and stable  Post vital signs: Reviewed and stable  Complications: No apparent anesthesia complications

## 2014-10-17 NOTE — Progress Notes (Signed)
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 Had Rt JJ stent placed 12/3.   Today "Allison Mitchell" is stable. Post-op ileus resolved, undergong port-a-cath today for furhter chemo.   Objective: Vital signs in last 24 hours: Temp:  [98.5 F (36.9 C)-99.7 F (37.6 C)] 99.7 F (37.6 C) (12/15 0624) Pulse Rate:  [72-76] 72 (12/15 0624) Resp:  [18-20] 18 (12/15 0624) BP: (129-145)/(56-71) 145/71 mmHg (12/15 0624) SpO2:  [96 %-100 %] 96 % (12/15 0624) Last BM Date: 10/16/14  Intake/Output from previous day: 12/14 0701 - 12/15 0700 In: 1160 [P.O.:960; I.V.:200] Out: 1025 [Urine:950; Stool:75] Intake/Output this shift:    General appearance: alert, cooperative, appears stated age and family at bedside Throat: lips, mucosa, and tongue normal; teeth and gums normal Back: symmetric, no curvature. ROM normal. No CVA tenderness. Resp: non-labored on room air Cardio: Nl rate GI: soft, non-tender; bowel sounds normal; no masses,  no organomegaly and RLQ colosotomy with gas and Lq stool  Pelvic: external genitalia normal Extremities: extremities normal, atraumatic, no cyanosis or edema Lymph nodes: Cervical, supraclavicular, and axillary nodes normal. Neurologic: Grossly normal Incision/Wound: prior midline incision c/d/i with staples.   Lab Results:   Recent Labs  10/15/14 0400 10/17/14 0422  WBC 8.5 8.4  HGB 7.9* 8.8*  HCT 25.5* 28.7*  PLT 325 PLATELET CLUMPS NOTED ON SMEAR, COUNT APPEARS ADEQUATE   BMET No results for input(s): NA, K, CL, CO2, GLUCOSE, BUN, CREATININE, CALCIUM in the last 72 hours. PT/INR No results for input(s): LABPROT, INR in the last 72 hours. ABG No results for input(s): PHART, HCO3 in the last 72 hours.  Invalid input(s): PCO2, PO2  Studies/Results: No results  found.  Anti-infectives: Anti-infectives    Start     Dose/Rate Route Frequency Ordered Stop   10/10/14 2200  cefoTEtan (CEFOTAN) 2 g in dextrose 5 % 50 mL IVPB     2 g100 mL/hr over 30 Minutes Intravenous Every 12 hours 10/10/14 1819 10/10/14 2147   10/10/14 0600  cefoTEtan (CEFOTAN) 2 g in dextrose 5 % 50 mL IVPB     2 g100 mL/hr over 30 Minutes Intravenous On call to O.R. 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 She will need GU follow-up at discharge for outpatient cysto stent pull in office.  Will follow.   [facility], Cathi Hazan 10/17/2014

## 2014-10-17 NOTE — Anesthesia Procedure Notes (Signed)
Procedure Name: LMA Insertion Date/Time: 10/17/2014 4:59 PM Performed by: Williemae Area B Pre-anesthesia Checklist: Patient identified, Emergency Drugs available, Suction available and Patient being monitored Patient Re-evaluated:Patient Re-evaluated prior to inductionOxygen Delivery Method: Circle system utilized Preoxygenation: Pre-oxygenation with 100% oxygen Intubation Type: IV induction Ventilation: Mask ventilation without difficulty LMA: LMA inserted LMA Size: 4.0 Number of attempts: 1 Placement Confirmation: positive ETCO2 and breath sounds checked- equal and bilateral Tube secured with: Tape (taped across cheeks)

## 2014-10-17 NOTE — Anesthesia Postprocedure Evaluation (Signed)
Anesthesia Post Note  Patient: Allison Mitchell  Procedure(s) Performed: Procedure(s) (LRB): INSERTION PORT-A-CATH LEFT SUBCLAVIAN AND MIDLINE INCISION STAPLE REMOVAL  (N/A)  Anesthesia type: general  Patient location: PACU  Post pain: Pain level controlled  Post assessment: Patient's Cardiovascular Status Stable  Last Vitals:  Filed Vitals:   10/17/14 1744  BP: 131/57  Pulse: 87  Temp: 37 C  Resp: 14    Post vital signs: Reviewed and stable  Level of consciousness: sedated  Complications: No apparent anesthesia complications

## 2014-10-17 NOTE — Progress Notes (Signed)
Patient ID: Allison Mitchell, female   DOB: [birth year], 54 y.o.   MRN: [id number] 7 Days Post-Op  Subjective: Doing well.  Overdid it a little yesterday, but better today  Objective: Vital signs in last 24 hours: Temp:  [98.5 F (36.9 C)-99.7 F (37.6 C)] 99.7 F (37.6 C) (12/15 0624) Pulse Rate:  [72-76] 72 (12/15 0624) Resp:  [18-20] 18 (12/15 0624) BP: (129-145)/(56-71) 145/71 mmHg (12/15 0624) SpO2:  [96 %-100 %] 96 % (12/15 0624) Last BM Date: 10/16/14  Intake/Output from previous day: 12/14 0701 - 12/15 0700 In: 1160 [P.O.:960; I.V.:200] Out: 1025 [Urine:950; Stool:75] Intake/Output this shift:    PE: Abd: soft, appropriately tender, stoma is sloughing some, but good output.  Midline incision is c/d/i with staples present  Lab Results:   Recent Labs  10/15/14 0400 10/17/14 0422  WBC 8.5 8.4  HGB 7.9* 8.8*  HCT 25.5* 28.7*  PLT 325 PLATELET CLUMPS NOTED ON SMEAR, COUNT APPEARS ADEQUATE   BMET No results for input(s): NA, K, CL, CO2, GLUCOSE, BUN, CREATININE, CALCIUM in the last 72 hours. PT/INR No results for input(s): LABPROT, INR in the last 72 hours. CMP     Component Value Date/Time   NA 144 10/14/2014 0436   K 3.6* 10/14/2014 0436   CL 109 10/14/2014 0436   CO2 19 10/14/2014 0436   GLUCOSE 88 10/14/2014 0436   BUN 8 10/14/2014 0436   CREATININE 0.60 10/14/2014 0436   CALCIUM 8.3* 10/14/2014 0436   GFRNONAA >90 10/14/2014 0436   GFRAA >90 10/14/2014 0436   Lipase  No results found for: LIPASE     Studies/Results: No results found.  Anti-infectives: Anti-infectives    Start     Dose/Rate Route Frequency Ordered Stop   10/10/14 2200  cefoTEtan (CEFOTAN) 2 g in dextrose 5 % 50 mL IVPB     2 g100 mL/hr over 30 Minutes Intravenous Every 12 hours 10/10/14 1819 10/10/14 2147   10/10/14 0600  cefoTEtan (CEFOTAN) 2 g in dextrose 5 % 50 mL IVPB     2 g100 mL/hr over 30 Minutes Intravenous On call to O.R. 10/09/14 0825 10/10/14 0607   10/05/14 0700   gentamicin (GARAMYCIN) IVPB 80 mg     80 mg100 mL/hr over 30 Minutes Intravenous 30 min pre-op 10/04/14 1103 10/05/14 1300   10/03/14 0200  ciprofloxacin (CIPRO) IVPB 400 mg  Status:  Discontinued     400 mg200 mL/hr over 60 Minutes Intravenous Every 12 hours 10/03/14 0055 10/05/14 1416   10/03/14 0200  metroNIDAZOLE (FLAGYL) IVPB 500 mg  Status:  Discontinued     500 mg100 mL/hr over 60 Minutes Intravenous Every 8 hours 10/03/14 0055 10/05/14 1416       Assessment/Plan   1. POD 7, s/p right colectomy with ileocolonic anastomosis with sigmoid colectomy and colostomy 2. 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 Right ureteral obstruction, s/p stenting 4. Mild ARI, follow 5. Leukocytosis, resolved 6. Hyperkalemia, resolved 7. Anemia, s/p 1 unit transfusion, stable   Plan: 1. Will plan for insertion of PAC today.  If all goes well today, then will plan for dc hopefully tomorrow 2. ? Staples out today in OR  LOS: 14 days    Brandin Stetzer E 10/17/2014, 10:16 AM Pager: [phone]

## 2014-10-17 NOTE — Op Note (Signed)
INSERTION PORT-A-CATH LEFT SUBCLAVIAN AND MIDLINE INCISION STAPLE REMOVAL   Procedure Note  Allison Mitchell 10/03/2014 - 10/17/2014   Pre-op Diagnosis: Colon Cancer     Post-op Diagnosis: same  Procedure(s): INSERTION PORT-A-CATH LEFT SUBCLAVIAN (8 fr)  Surgeon(s): Coralie Keens, MD  Anesthesia: General  Staff:  Circulator: Darol Destine, RN Radiology Technologist: [place] Scrub Person: Montel Culver, RN  Estimated Blood Loss: Minimal                        [name]   Date: 10/17/2014  Time: 5:33 PM

## 2014-10-17 NOTE — Anesthesia Preprocedure Evaluation (Signed)
Anesthesia Evaluation  Patient identified by MRN, date of birth, ID band Patient awake    Reviewed: Allergy & Precautions, H&P , NPO status , Patient's Chart, lab work & pertinent test results  History of Anesthesia Complications Negative for: history of anesthetic complications  Airway Mallampati: II  TM Distance: >3 FB Neck ROM: Full    Dental  (+) Dental Advisory Given, Teeth Intact   Pulmonary former smoker,  breath sounds clear to auscultation        Cardiovascular - anginanegative cardio ROS  Rhythm:Regular Rate:Normal     Neuro/Psych negative neurological ROS     GI/Hepatic Neg liver ROS, GERD-  Medicated and Poorly Controlled,Small bowel cancer   Endo/Other  Morbid obesity  Renal/GU ureteral obstruction     Musculoskeletal   Abdominal   Peds  Hematology  (+) Blood dyscrasia (Hb 8,7), ,   Anesthesia Other Findings   Reproductive/Obstetrics                             Anesthesia Physical Anesthesia Plan  ASA: III  Anesthesia Plan: General LMA and General   Post-op Pain Management:    Induction:   Airway Management Planned:   Additional Equipment:   Intra-op Plan:   Post-operative Plan:   Informed Consent:   Plan Discussed with:   Anesthesia Plan Comments:         Anesthesia Quick Evaluation

## 2014-10-17 NOTE — Progress Notes (Signed)
NUTRITION FOLLOW UP  Intervention:   - Once diet advanced, recommend Ensure Complete po BID, each supplement provides 350 kcal and 13 grams of protein  Nutrition Dx:   Inadequate oral intake related to inability to eat as evidenced by NPO status; ongoing  Goal:   Pt to meet >/= 90% of their estimated nutrition needs   Monitor:   Diet advancement, NPO, labs, weight trend  Assessment:   Pt is now s/p right colectomy with ileocolonic anastomosis with sigmoid colectomy and colostomy due to stage III adenocarcinoma, final pathology pending. Pt also with right ureteral obstruction now s/p stenting.   - Pt with minor wt loss. She tolerated regular diet yesterday, but is NPO for PAC insertion today. Possibly will be discharged tomorrow. She said her appetite has been poor due to nausea from colostomy bag change. Discussed importance of weight maintenance throughout chemotherapy and the use of nutritional supplements if needed.   Labs: Na WNL K Low BUN WNL  Height: Ht Readings from Last 1 Encounters:  10/02/14 5\' 8"  (1.727 m)    Weight Status:   Wt Readings from Last 1 Encounters:  10/02/14 227 lb (102.967 kg)    Re-estimated needs:  Kcal: 1900-2100 Protein: 100-115 g Fluid: >1.9 L/day  Skin: closed incisions on abdomen and perineum  Diet Order: Diet NPO time specified Except for: Sips with Meds   Intake/Output Summary (Last 24 hours) at 10/17/14 1122 Last data filed at 10/17/14 0530  Gross per 24 hour  Intake    920 ml  Output    975 ml  Net    -55 ml    Last BM: prior to admission   Labs:   Recent Labs Lab 10/12/14 0430 10/13/14 0915 10/14/14 0436  NA 134* 140 144  K 5.0 3.9 3.6*  CL 100 104 109  CO2 21 25 19   BUN 16 9 8   CREATININE 0.87 0.61 0.60  CALCIUM 8.4 8.1* 8.3*  GLUCOSE 133* 109* 88    CBG (last 3)  No results for input(s): GLUCAP in the last 72 hours.  Scheduled Meds: . citalopram  40 mg Oral Daily  . [START ON 10/18/2014] enoxaparin  (LOVENOX) injection  40 mg Subcutaneous Q24H  . pantoprazole  40 mg Oral Daily    Continuous Infusions: . sodium chloride 50 mL/hr at 10/15/14 2328  . lactated ringers 10 mL/hr at 10/10/14 [place] MS, RD, LDN

## 2014-10-18 ENCOUNTER — Encounter (HOSPITAL_COMMUNITY): Payer: Self-pay | Admitting: Surgery

## 2014-10-18 MED ORDER — HYDROCODONE-ACETAMINOPHEN 5-325 MG PO TABS: 1.0000 | ORAL_TABLET | ORAL | Status: DC | PRN

## 2014-10-18 MED ORDER — ZOLPIDEM TARTRATE 5 MG PO TABS: 5.0000 mg | ORAL_TABLET | Freq: Every evening | ORAL | Status: DC | PRN

## 2014-10-18 NOTE — Op Note (Signed)
NAMEBENEDICTA, SULTAN NO.:  [id number]  MEDICAL RECORD NO.:  [id number]  LOCATION:  6N10C                        FACILITY:  [facility]  PHYSICIAN:  Coralie Keens, M.D. DATE OF BIRTH:  29-Jun-1961  DATE OF PROCEDURE:  10/17/2014 DATE OF DISCHARGE:                              OPERATIVE REPORT   PREOPERATIVE DIAGNOSIS:  Colon cancer.  POSTOPERATIVE DIAGNOSIS:  Colon cancer.  PROCEDURE:  Left subclavian Port-A-Cath insertion.  SURGEON:  Coralie Keens, M.D.  ANESTHESIA:  General and 0.5% Marcaine.  ESTIMATED BLOOD LOSS:  Minimal.  PROCEDURE IN DETAIL:  The patient was brought to the operating room, identified as Allison Mitchell.  She was placed supine on the operating room table and anesthesia was induced.  Her left chest and neck were then prepped and draped in usual sterile fashion.  The patient was placed in the Trendelenburg position.  Using the introducer needle, I was able to easily cannulate the left subclavian vein.  A wire was then passed through the introducer needle and the needle was removed. Fluoroscopy confirmed placement of the superior vena cava.  I anesthetized the skin further with Marcaine and then made incision with a scalpel.  I then created a pocket for the port with the electrocautery.  An 8-French ClearVUE port was then brought to the field.  It was flushed appropriately.  I then passed the catheter to the port and secured it in place and then cut in appropriate length.  I then placed an introducer sheath and dilator over the wire and into the central venous system.  I then removed the dilator and guidewire.  The catheter was then fed down the peel away sheath into the central venous system.  The sheath was then peeled away.  I then accessed the port and good flush return were demonstrated.  Placement of the superior vena cava appeared to be achieved under fluoroscopy.  At this point, the port was sewn to the chest wall with two  separate 3-0 Prolene sutures.  I accessed the port again and instilled concentrated heparin solution into the catheter.  I then closed the subcutaneous tissue with interrupted 3- 0 Vicryl sutures and closed the skin with a running 4-0 Monocryl.  Steri- Strips, gauze, and Tegaderm were then applied.  The patient tolerated the procedure well.  All the counts were correct at the end of the procedure.  The patient was then extubated in the operating room and taken in stable condition to the recovery room.    Coralie Keens, M.D.    DB/MEDQ  D:  10/17/2014  T:  10/18/2014  Job:  [id number]

## 2014-10-18 NOTE — Progress Notes (Signed)
Discussed discharge summary with patient. Patient education provided regarding colostomy bag and care at home. Reviewed all medications with patient. Patient received Rx. Patient ready for discharge.

## 2014-10-18 NOTE — Care Management (Signed)
10-18-14 Santiago Glad at [facility] aware patient discharged to home today . Discharge summary faxed to [facility] . Magdalen Spatz RN BSN

## 2014-10-18 NOTE — Consult Note (Signed)
WOC ostomy follow up Pt preparing to discharge today, acording to [facility]. Stoma type/location: Left upper quad colostomy Stomal assessment/size: Stoma red and viable, slightly above skin level, 1 3/4 inches  Peristomal assessment: Intact skin surrounding. Deep crease occurs at 9:00 o'clock when patient sits up, creating difficult pouching situation. Output: 50cc liquid brown stool in pouch. Ostomy pouching:  1 piece pouch intact with good seal  Education provided: Demonstrated pouch change using one piece flexible pouch with barrier ring to maintain seal; husband at bedside for the first time during ostomy teaching sessions. Pt assisted with pouch applicationand is able to open and close velcro to empty. 3 sets of barrier rings and pouches at bedside for discharge use. Discussed pouching routines and educational materials left at bedside. Has been placed on [facility] discharge program. Pt and husband deny further questions at this time. Julien Girt MSN, RN, [place], [place], [place]

## 2014-10-18 NOTE — Discharge Instructions (Signed)
[facility]      [facility] °[phone] ° °OPEN ABDOMINAL SURGERY: POST OP INSTRUCTIONS ° °Always review your discharge instruction sheet given to you by the facility where your surgery was performed. ° °IF YOU HAVE DISABILITY OR FAMILY LEAVE FORMS, YOU MUST BRING THEM TO THE OFFICE FOR PROCESSING.  PLEASE DO NOT GIVE THEM TO YOUR DOCTOR. ° °1. A prescription for pain medication may be given to you upon discharge.  Take your pain medication as prescribed, if needed.  If narcotic pain medicine is not needed, then you may take acetaminophen (Tylenol) or ibuprofen (Advil) as needed. °2. Take your usually prescribed medications unless otherwise directed. °3. If you need a refill on your pain medication, please contact your pharmacy. They will contact our office to request authorization.  Prescriptions will not be filled after 5pm or on week-ends. °4. You should follow a light diet the first few days after arrival home, such as soup and crackers, pudding, etc.unless your doctor has advised otherwise. A high-fiber, low fat diet can be resumed as tolerated.   Be sure to include lots of fluids daily. Most patients will experience some swelling and bruising on the chest and neck area.  Ice packs will help.  Swelling and bruising can take several days to resolve °5. Most patients will experience some swelling and bruising in the area of the incision. Ice pack will help. Swelling and bruising can take several days to resolve..  °6. It is common to experience some constipation if taking pain medication after surgery.  Increasing fluid intake and taking a stool softener will usually help or prevent this problem from occurring.  A mild laxative (Milk of Magnesia or Miralax) should be taken according to package directions if there are no bowel movements after 48 hours. °7.  You may have steri-strips (small skin tapes) in place directly over the incision.  These strips should be left on the skin for 7-10 days.  If your  surgeon used skin glue on the incision, you may shower in 24 hours.  The glue will flake off over the next 2-3 weeks.  Any sutures or staples will be removed at the office during your follow-up visit. You may find that a light gauze bandage over your incision may keep your staples from being rubbed or pulled. You may shower and replace the bandage daily. °8. ACTIVITIES:  You may resume regular (light) daily activities beginning the next day--such as daily self-care, walking, climbing stairs--gradually increasing activities as tolerated.  You may have sexual intercourse when it is comfortable.  Refrain from any heavy lifting or straining until approved by your doctor. °a. You may drive when you no longer are taking prescription pain medication, you can comfortably wear a seatbelt, and you can safely maneuver your car and apply brakes °b. Return to Work: ___________________________________ °9. You should see your doctor in the office for a follow-up appointment approximately two weeks after your surgery.  Make sure that you call for this appointment within a day or two after you arrive home to insure a convenient appointment time. °OTHER INSTRUCTIONS:  °_____________________________________________________________ °_____________________________________________________________ ° °WHEN TO CALL YOUR DOCTOR: °1. Fever over 101.0 °2. Inability to urinate °3. Nausea and/or vomiting °4. Extreme swelling or bruising °5. Continued bleeding from incision. °6. Increased pain, redness, or drainage from the incision. °7. Difficulty swallowing or breathing °8. Muscle cramping or spasms. °9. Numbness or tingling in hands or feet or around lips. ° °The clinic staff is available to   answer your questions during regular business hours.  Please don’t hesitate to call and ask to speak to one of the nurses if you have concerns. ° °For further questions, please visit [email or web address] ° °Colostomy Home Guide °A colostomy is an  opening for stool to leave your body when a medical condition prevents it from leaving through the usual opening (rectum). During a surgery, a piece of large intestine (colon) is brought through a hole in the abdominal wall. The new opening is called a stoma or ostomy. A bag or pouch fits over the stoma to catch stool and gas. Your stool may be liquid, somewhat pasty, or formed. °CARING FOR YOUR STOMA  °Normally, the stoma looks a lot like the inside of your cheek: pink, red, and moist. At first it may be swollen, but this swelling will decrease within 6 weeks. °Keep the skin around your stoma clean and dry. You can gently wash your stoma and the skin around your stoma in the shower with a clean, soft washcloth. If you develop any skin irritation, your caregiver may give you a stoma powder or ointment to help heal the area. Do not use any products other than those specifically given to you by your caregiver.  °Your stoma should not be uncomfortable. If you notice any stinging or burning, your pouch may be leaking, and the skin around your stoma may be coming into contact with stool. This can cause skin irritation. If you notice stinging, replace your pouch with a new one and discard the old one. °OSTOMY POUCHES  °The pouch that fits over the ostomy can be made up of either 1 or 2 pieces. A one-piece pouch has a skin barrier piece and the pouch itself in one unit. A two-piece pouch has a skin barrier with a separate pouch that snaps on and off of the skin barrier. Either way, you should empty the pouch when it is only  to ½ full. Do not let more stool or gas build up. This could cause the pouch to leak. °Some ostomy bags have a built-in gas release valve. Ostomy deodorizer (5 drops) can be put into the pouch to prevent odor. Some people use ostomy lubricant drops inside the pouch to help the stool slide out of the bag more easily and completely.  °EMPTYING YOUR OSTOMY POUCH  °You may get lessons on how to empty your  pouch from a wound-ostomy nurse before you leave the hospital. Here are the basic steps: °· Wash your hands with soap and water. °· Sit far back on the toilet. °· Put several pieces of toilet paper into the toilet water. This will prevent splashing as you empty the stool into the toilet bowl. °· Unclip or unvelcro the tail end of the pouch. °· Unroll the tail and empty stool into the toilet. °· Clean the tail with toilet paper. °· Reroll the tail, and clip or velcro it closed. °· Wash your hands again. °CHANGING YOUR OSTOMY POUCH  °Change your ostomy pouch about every 3 to 4 days for the first 6 weeks, then every 5 to7 days. Always change the bag sooner if there is any leakage or you begin to notice any discomfort or irritation of the skin around the stoma. When possible, plan to change your ostomy pouch before eating or drinking as this will lessen the chance of stool coming out during the pouch change. A wound-ostomy nurse may teach you how to change your pouch before you leave the hospital. Here are   the basic steps:  [place] Barr out your supplies.  Wash your hands with soap and water.  Carefully remove the old pouch.  Wash the stoma and allow it to dry. Men may be advised to shave any hair around the stoma very carefully. This will make the adhesive stick better.  Use the stoma measuring guide that comes with your pouch set to decide what size hole you will need to cut in the skin barrier piece. Choose the smallest possible size that will hold the stoma but will not touch it.  Use the guide to trace the circle on the back of the skin barrier piece. Cut out the hole.  Hold the skin barrier piece over the stoma to make sure the hole is the correct size.  Remove the adhesive paper backing from the skin barrier piece.  Squeeze stoma paste around the opening of the skin barrier piece.  Clean and dry the skin around the stoma again.  Carefully fit the skin barrier piece over your stoma.  If you are  using a two-piece pouch, snap the pouch onto the skin barrier piece.  Close the tail of the pouch.  Put your hand over the top of the skin barrier piece to help warm it for about 5 minutes, so that it conforms to your body better.  Wash your hands again. DIET TIPS   Continue to follow your usual diet.  Drink about eight 8 oz glasses of water each day.  You can prevent gas by eating slowly and chewing your food thoroughly.  If you feel concerned that you have too much gas, you can cut back on gas-producing foods, such as:  Spicy foods.  Onions and garlic.  Cruciferous vegetables (cabbage, broccoli, cauliflower, Brussels sprouts).  Beans and legumes.  Some cheeses.  Eggs.  Fish.  Bubbly (carbonated) drinks.  Chewing gum. GENERAL TIPS   You can shower with or without the bag in place.  Always keep the bag on if you are bathing or swimming.  If your bag gets wet, you can dry it with a blow-dryer set to cool.  Avoid wearing tight clothing directly over your stoma so that it does not become irritated or bleed. Tight clothing can also prevent stool from draining into the pouch.  It is helpful to always have an extra skin barrier and pouch with you when traveling. Do not leave them anywhere too warm, as parts of them can melt.  Do not let your seat belt rest on your stoma. Try to keep the seat belt either above or below your stoma, or use a tiny pillow to cushion it.  You can still participate in sports, but you should avoid activities in which there is a risk of getting hit in the abdomen.  You can still have sex. It is a good idea to empty your pouch prior to sex. Some people and their partners feel very comfortable seeing the pouch during sex. Others choose to wear lingerie or a T-shirt that covers the device. SEEK IMMEDIATE MEDICAL CARE IF:  You notice a change in the size or color of the stoma, especially if it becomes very red, purple, black, or pale white.  You  have bloody stools or bleeding from the stoma.  You have abdominal pain, nausea, vomiting, or bloating.  There is anything unusual protruding from the stoma.  You have irritation or red skin around the stoma.  No stool is passing from the stoma.  You have diarrhea (requiring more frequent  than normal pouch emptying). Document Released: 10/23/2003 Document Revised: 01/12/2012 Document Reviewed: 03/19/2011 [facility] Patient Information 2015 [place], [place]. This information is not intended to replace advice given to you by your health care provider. Make sure you discuss any questions you have with your health care provider.  Implanted [facility] Guide An implanted port is a type of central line that is placed under the skin. Central lines are used to provide IV access when treatment or nutrition needs to be given through a person's veins. Implanted ports are used for long-term IV access. An implanted port may be placed because:   You need IV medicine that would be irritating to the small veins in your hands or arms.   You need long-term IV medicines, such as antibiotics.   You need IV nutrition for a long period.   You need frequent blood draws for lab tests.   You need dialysis.  Implanted ports are usually placed in the chest area, but they can also be placed in the upper arm, the abdomen, or the leg. An implanted port has two main parts:   Reservoir. The reservoir is round and will appear as a small, raised area under your skin. The reservoir is the part where a needle is inserted to give medicines or draw blood.   Catheter. The catheter is a thin, flexible tube that extends from the reservoir. The catheter is placed into a large vein. Medicine that is inserted into the reservoir goes into the catheter and then into the vein.  HOW WILL I CARE FOR MY INCISION SITE? Do not get the incision site wet. Bathe or shower as directed by your health care provider.  HOW IS MY PORT  ACCESSED? Special steps must be taken to access the port:   Before the port is accessed, a numbing cream can be placed on the skin. This helps numb the skin over the port site.   Your health care provider uses a sterile technique to access the port.  Your health care provider must put on a mask and sterile gloves.  The skin over your port is cleaned carefully with an antiseptic and allowed to dry.  The port is gently pinched between sterile gloves, and a needle is inserted into the port.  Only "non-coring" port needles should be used to access the port. Once the port is accessed, a blood return should be checked. This helps ensure that the port is in the vein and is not clogged.   If your port needs to remain accessed for a constant infusion, a clear (transparent) bandage will be placed over the needle site. The bandage and needle will need to be changed every week, or as directed by your health care provider.   Keep the bandage covering the needle clean and dry. Do not get it wet. Follow your health care provider's instructions on how to take a shower or bath while the port is accessed.   If your port does not need to stay accessed, no bandage is needed over the port.  WHAT IS FLUSHING? Flushing helps keep the port from getting clogged. Follow your health care provider's instructions on how and when to flush the port. Ports are usually flushed with saline solution or a medicine called heparin. The need for flushing will depend on how the port is used.   If the port is used for intermittent medicines or blood draws, the port will need to be flushed:   After medicines have been given.  After blood has been drawn.   As part of routine maintenance.   If a constant infusion is running, the port may not need to be flushed.  HOW LONG WILL MY PORT STAY IMPLANTED? The port can stay in for as long as your health care provider thinks it is needed. When it is time for the port to  come out, surgery will be done to remove it. The procedure is similar to the one performed when the port was put in.  WHEN SHOULD I SEEK IMMEDIATE MEDICAL CARE? When you have an implanted port, you should seek immediate medical care if:   You notice a bad smell coming from the incision site.   You have swelling, redness, or drainage at the incision site.   You have more swelling or pain at the port site or the surrounding area.   You have a fever that is not controlled with medicine. Document Released: 10/20/2005 Document Revised: 08/10/2013 Document Reviewed: 06/27/2013 [facility] Patient Information 2015 [place], [place]. This information is not intended to replace advice given to you by your health care provider. Make sure you discuss any questions you have with your health care provider.

## 2014-10-18 NOTE — Discharge Summary (Signed)
Patient ID: Allison Mitchell MRN: [id number] DOB/AGE: 01-30-1961 53 y.o.  Admit date: 10/03/2014 Discharge date: 10/18/2014  Procedures:  1. Port a Cath placement by Dr. Ninfa Linden on 10-17-14  2. EXPLORATORY LAPAROTOMY OPEN RIGHT URETERAL LYSIS  PARTIAL SIGMOID COLECTOMY COLOSTOMY PARTIAL RIGHT COLECTOMY WITH REMOVAL OF THE TERMINAL ILEUM  By Dr. Hulen Skains and Dr. Tresa Moore on 10-11-14  3.  Cystoscopy with right retrograde pyelogram interpretation. Right diagnostic ureteroscopy. Insertion of right ureteral stent 7 x 24, no tether. By Dr. Tresa Moore on 10-05-14  Consults: urology and oncology  Reason for Admission:  Patient recalls having some problems dating back to August 2014. Workup at that time included being seen at [facility] where patient was found to have bilateral pleural effusions, but no other problem ccausing her abdominal pain.  Thsi current episode started last Thursday and had progressed since that time to severe pain on presentation today. She wen to the [facility] where the surgeon on call did not feel comfortable taking care of this problem. A referral to [facility] was denied, and [facility] was the family's second choice, but they definitely want to leave [facility].  The patient had a colonoscopy 6-7 days ago, and multiple biopsies were taken. All the results are currently pending.  Admission Diagnoses:  1/ RLQ inflammatory mass or tumor 2. Right ureteral obstruction secondary to #1 3. PSBO secondary to #1  Hospital Course:  1. Colonic Adenocarcinoma:  The patient was admitted and we kept her NPO initially due to nausea and vomiting.  We contacted her GI office and eventually got her results from her colonoscopy that confirmed the findings on her CT scan and her colonoscopy in her sigmoid, ascending colon, and TI were adenocarcinoma.  Oncology was asked to evaluate the patient.  Initially this was felt to possibly be a small bowel cancer invading her cecum and sigmoid  colon, but then eventually after relooking at scans, thought this may actually be a sigmoid colon cancer invading these other structures.  Urology was consulted for stent placement and evaluate for invasion of her right ureter to better make decisions on operative intervention.  Cystoscopy was completed and a stent was placed.  There was no direct invasion noted, but clearly compression was secondary to extrinsic source.  Several days later, the patient went to the operating room where she underwent a right colectomy with resection of terminal ileum and sigmoid colectomy with end colostomy.  She also had an open right ureteral lysis completed by urology at that time.  Due to invasion of the iliac vessels and ureteral tissue, the surgical was not going to be curative and therefore, resecting and replacing her ureter was not completed.    The patient did well post operatively.  She had an NGT in place and had a post operative ileus.  Her NGT was able to be removed on POD 3.  Her diet was then able to be advanced as tolerates.  Her midline wound remained intact and her staples were removed prior to discharge home.  Her stoma was dusky initially and began to slough some by the time of discharge, but viable mucosa was present underneath this.    On POD 7, the patient was taken back down to the operating room where she underwent placement of a port a cath.  She tolerated that well with no issues.  Her pathology returned on POD 6.  This revealed T4,N2,MX.  She had 3 separate synchronous lesions of her sigmoid colon, ascending colon, and her  ileocecal valve.  She had 5/17 LNs positive.  She had an MRI and a CT scan while present that ruled out distant metastases.  Therefore, she was noted to be a Stage IIIc.  This was thoroughly discussed with the patient and her family.  On POD8, the patient was medically and surgically stable for discharge home.  HH has been arranged for routine ostomy care.  PE: Abd: soft,  appropriately tender, +BS, incision c/d/i with steri-strips present, ostomy in place with good output and some sloughing at the stoma, but viable tissue present Chest: PAC site is clean and intact.   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     HYDROcodone-acetaminophen 5-325 MG per tablet  Commonly known as:  NORCO/VICODIN  Take 1-2 tablets by mouth every 4 (four) hours as needed for moderate pain or severe pain.     omeprazole 40 MG capsule  Commonly known as:  PRILOSEC  Take 40 mg by mouth every morning.     zolpidem 5 MG tablet  Commonly known as:  AMBIEN  Take 1 tablet (5 mg total) by mouth at bedtime as needed for sleep.        Discharge Instructions:     Follow-up Information    Follow up with Doreen Salvage, MD On 11/07/2014.   Specialty:  General Surgery   Why:  10:10am, arrive by 9:45am for paperwork   Contact information:   [address] [place] [address] [phone]       Follow up with Truitt Merle, MD.   Specialties:  Hematology, Oncology   Why:  their office will call you   Contact information:   [place] [address] [phone]       Follow up with Alexis Frock, MD.   Specialty:  Urology   Why:  his office will call you   Contact information:   [place] [address] [phone]   Follow up for eventual stent removal    Signed: Danetra Glock E 10/18/2014, 10:01 AM

## 2014-10-19 ENCOUNTER — Telehealth (INDEPENDENT_AMBULATORY_CARE_PROVIDER_SITE_OTHER): Payer: Self-pay

## 2014-10-19 NOTE — Telephone Encounter (Signed)
Tye Maryland from [facility] called in reference to this pt. Nurse states that they were referred to teach and train on ostomy care for this pt. The nurse states that the ostomy looks white to grey in color. Nurse also states that patient is not having any pain at ostomy and has not complained about it. Pt stated to nurse that it was this color in the hospital and was told that it was normal. Home health nurse would like to know what to do. [phone]. Please advise. Thank you

## 2014-10-20 ENCOUNTER — Telehealth: Payer: Self-pay | Admitting: Hematology

## 2014-10-20 ENCOUNTER — Other Ambulatory Visit: Payer: Self-pay | Admitting: Hematology

## 2014-10-20 ENCOUNTER — Encounter (HOSPITAL_COMMUNITY): Payer: Self-pay

## 2014-10-20 DIAGNOSIS — C189 Malignant neoplasm of colon, unspecified: Secondary | ICD-10-CM

## 2014-10-20 NOTE — Telephone Encounter (Signed)
patient scheduled to see Dr. Burr Medico 01/05 @ 1:30 hospital f/u.

## 2014-10-20 NOTE — Telephone Encounter (Signed)
As long as the stoma is attached at the skin level and working it is okay.  Not sure about the color, but it is far enough out now that is should not "die" or necrose.  Make sure she has followup visit with me in the not too distant future.

## 2014-11-07 ENCOUNTER — Ambulatory Visit: Payer: BLUE CROSS/BLUE SHIELD

## 2014-11-07 ENCOUNTER — Other Ambulatory Visit: Payer: Self-pay | Admitting: General Surgery

## 2014-11-07 ENCOUNTER — Encounter: Payer: Self-pay | Admitting: Hematology

## 2014-11-07 ENCOUNTER — Telehealth: Payer: Self-pay | Admitting: Hematology

## 2014-11-07 ENCOUNTER — Encounter (INDEPENDENT_AMBULATORY_CARE_PROVIDER_SITE_OTHER): Payer: Self-pay

## 2014-11-07 ENCOUNTER — Ambulatory Visit (HOSPITAL_BASED_OUTPATIENT_CLINIC_OR_DEPARTMENT_OTHER): Payer: BLUE CROSS/BLUE SHIELD | Admitting: Hematology

## 2014-11-07 VITALS — BP 145/70 | HR 87 | Temp 98.8°F | Resp 20 | Ht 68.0 in | Wt 213.2 lb

## 2014-11-07 DIAGNOSIS — C189 Malignant neoplasm of colon, unspecified: Secondary | ICD-10-CM

## 2014-11-07 DIAGNOSIS — C188 Malignant neoplasm of overlapping sites of colon: Secondary | ICD-10-CM

## 2014-11-07 NOTE — Telephone Encounter (Signed)
gv adn printed appt sched and avs for pt for Jan per pof 1st chemo 1.21 folfox per pof. no chemo edu available same day as radonc

## 2014-11-07 NOTE — Progress Notes (Signed)
[place]  [phone] Fax:([phone]) [place] Note   Patient Care Team: [name], MD as PCP - General (Family Medicine) 11/07/2014  CHIEF COMPLAINTS/PURPOSE OF CONSULTATION:  Synchronized three colon cancer, stage III    Colon cancer   10/04/2014 Tumor Marker CEA 1.0 .  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 mpT4aN2aM0,stage IIIC.    10/10/2014 Surgery partial right hemicolectomy with removal of terminal ileum, sigmoid colectomy, right ureter lysis    HISTORY OF PRESENTING ILLNESS:  Allison Mitchell 54 y.o. female is here for follow-up after her hospital discharge. She was recently diagnosed with 3 synchronous to colon cancer, status post surgical resection. I initially saw her when she was diagnosed in the hospital.  She presented with some GI symptoms since last October 2014 at which time she was diagnosed with ischemic colitis. Due to guaiac-positive stools, patient had undergone an outpatient colonoscopy on 09/26/2014, which showed inflamed and ulcerated mucosa in the terminal ileum, and a 1.5 cm mass in the ascending colon and additional 1.5 cm mass in the mid sigmoid colon. All biopsy from the above 3 sites came back positive for invasive moderate differentiated adenocarcinoma, and the sigmoid mass containing squamous cell differentiation.   She was hospitalized for worsening of her symptoms on 10/03/2014.  A CT of the abdomen and pelvis with contrast on 10/02/2014 was remarkable for soft tissue/bowel wall thickening of the right lower pelvis center at the cecum-terminal ileum involving a portion of the adjacent sigmoid colon. This causes small bowel obstruction and obstructed the distal right ureter causing moderate to severe right hydroureteronephrosis. She  was transferred [facility] to [facility] for surgical management and further testing. CT of the chest with contrast on 10/03/2014 negative for metastatic disease. MRI of the liver without and without contrast on 10/03/2014 is remarkable for small hepatic cysts, but no findings suspicious for hepatic metastatic disease. Right-sided hydroureteronephrosis was once again seen, with early small bowel obstruction bowel gas pattern.  She underwent right hemicolectomy with primary anastomosis with sigmoid colectomy with end colostomy on Friday, Dec 5, after cystoscopy is performed on 12/4 to rule out GU invasion and a ureteral stent placement.  She has family history of colon cancer in her grandmother and polyps in her sister. Denies risk factors for HIV or hepatitis. Denies Diabetes. No Tobacco. No ETOH. Denies prior radiation. SHe did consume significant amount of red meat until recently. We are asked to see the patient in consultation, with recommendations on the oncology standpoint  She is recovering slowly after the surgery. She had the surgical suture opened about 3 weeks ago and was treated for antibiotics , and now much improved. She has no significant pain, but mild abdominal discomfort, no nausea, her stool is getting thicker, she clean her colostomy bag daily. She was seen by Dr. Hulen Skains this morning. She noticed some cloudy urine in the past few days, with mild burning sensation, no fever or back pain. Her appetite is getting better, eats well.  She lost about 25 lbs. No fever or chills. No other new complains.    MEDICAL HISTORY:  Past Medical History  Diagnosis Date  . GERD (gastroesophageal reflux disease)   . Depression     SURGICAL HISTORY: Past Surgical History  Procedure Laterality Date  . Cystoscopy with  retrograde pyelogram, ureteroscopy and stent placement Right 10/05/2014    Procedure: [place], URETEROSCOPY  AND STENT PLACEMENT;  Surgeon:  Alexis Frock, MD;  Location: [facility];  Service: Urology;  Laterality: Right;  . Laparotomy N/A 10/10/2014    Procedure: EXPLORATORY LAPAROTOMY;  Surgeon: Doreen Salvage, MD;  Location: [place];  Service: General;  Laterality: N/A;  . Partial colectomy  10/10/2014    Procedure: PARTIAL SIGMOID COLECTOMY;  Surgeon: Doreen Salvage, MD;  Location: [place];  Service: General;;  . Colostomy  10/10/2014    Procedure: COLOSTOMY;  Surgeon: Doreen Salvage, MD;  Location: [place];  Service: General;;  . Colostomy revision  10/10/2014    Procedure: PARTIAL RIGHT COLECTOMY;  Surgeon: Doreen Salvage, MD;  Location: [place];  Service: General;;  . Ureteral reimplantion Right 10/10/2014    Procedure: OPEN RIGHT URETERAL LYSIS ;  Surgeon: Alexis Frock, MD;  Location: [place];  Service: Urology;  Laterality: Right;  . Portacath placement N/A 10/17/2014    Procedure: INSERTION PORT-A-CATH LEFT SUBCLAVIAN AND MIDLINE INCISION STAPLE REMOVAL ;  Surgeon: Coralie Keens, MD;  Location: [place];  Service: General;  Laterality: N/A;    SOCIAL HISTORY: History   Social History  . Marital Status: Married    Spouse Name: N/A    Number of Children: N/A  . Years of Education: N/A   Occupational History  . Not on file.   Social History Main Topics  . Smoking status: Never Smoker   . Smokeless tobacco: Not on file  . Alcohol Use: No  . Drug Use: No  . Sexual Activity: Not on file   Other Topics Concern  . Not on file   Social History Narrative    FAMILY HISTORY: Family History  Problem Relation Age of Onset  . Cancer Mother     skin cancer   . Diabetes Father   . Cancer Maternal Grandmother 48    colon cancer   . Cancer Paternal Grandmother 94    breast cancer     ALLERGIES:  has No Known Allergies.  MEDICATIONS:  Current Outpatient Prescriptions  Medication Sig Dispense Refill  . citalopram (CELEXA) 20 MG tablet Take 40 mg by mouth daily.  1  . omeprazole (PRILOSEC) 40 MG capsule Take 40 mg by mouth every morning.  12    . zolpidem (AMBIEN) 5 MG tablet Take 1 tablet (5 mg total) by mouth at bedtime as needed for sleep. 20 tablet 0   No current facility-administered medications for this visit.    REVIEW OF SYSTEMS:   Constitutional: Denies fevers, chills or abnormal night sweats Eyes: Denies blurriness of vision, double vision or watery eyes Ears, nose, mouth, throat, and face: Denies mucositis or sore throat Respiratory: Denies cough, dyspnea or wheezes Cardiovascular: Denies palpitation, chest discomfort or lower extremity swelling Gastrointestinal:  Denies nausea, heartburn or change in bowel habits Skin: Denies abnormal skin rashes Lymphatics: Denies new lymphadenopathy or easy bruising Neurological:Denies numbness, tingling or new weaknesses Behavioral/Psych: Mood is stable, no new changes  All other systems were reviewed with the patient and are negative.  PHYSICAL EXAMINATION: ECOG PERFORMANCE STATUS: 1 - Symptomatic but completely ambulatory  Filed Vitals:   11/07/14 1355  BP: 145/70  Pulse: 87  Temp: 98.8 F (37.1 C)  Resp: 20   Filed Weights   11/07/14 1355  Weight: 213 lb 3.2 oz (96.707 kg)    GENERAL:alert, no distress and comfortable SKIN: skin color, texture, turgor are normal, no rashes  or significant lesions EYES: normal, conjunctiva are pink and non-injected, sclera clear OROPHARYNX:no exudate, no erythema and lips, buccal mucosa, and tongue normal  NECK: supple, thyroid normal size, non-tender, without nodularity LYMPH:  no palpable lymphadenopathy in the cervical, axillary or inguinal LUNGS: clear to auscultation and percussion with normal breathing effort HEART: regular rate & rhythm and no murmurs and no lower extremity edema ABDOMEN:abdomen soft, non-tender and normal bowel sounds. Positive for colostomy back on the left, with normal-appearing stool. Surgical wound upper part is well-healed, lower part is covered by gauze. Musculoskeletal:no cyanosis of digits and no  clubbing  PSYCH: alert & oriented x 3 with fluent speech NEURO: no focal motor/sensory deficits  LABORATORY DATA:  I have reviewed the data as listed Lab Results  Component Value Date   WBC 8.4 10/17/2014   HGB 8.8* 10/17/2014   HCT 28.7* 10/17/2014   MCV 72.5* 10/17/2014   PLT  10/17/2014    PLATELET CLUMPS NOTED ON SMEAR, COUNT APPEARS ADEQUATE    Recent Labs  10/12/14 0430 10/13/14 0915 10/14/14 0436  NA 134* 140 144  K 5.0 3.9 3.6*  CL 100 104 109  CO2 _0 GLUCOSE 133* 109* 88  BUN _1 CREATININE 0.87 0.61 0.60  CALCIUM 8.4 8.1* 8.3*  GFRNONAA 75* >90 >90  GFRAA 87* >90 >90   Surgical path 10/10/2014 1. Colon, segmental resection for tumor, Cecum, terminal ileum, sigmoid colon - MULTIFOCAL INVASIVE ADENOCARCINOMA, MODERATELY DIFFERENTIATED, THE LARGEST FOCUS SPANS 4.0 CM. - ADENOCARCINOMA INVOLVES SEROSA. - LYMPHOVASCULAR INVASION IS IDENTIFIED. - METASTATIC CARCINOMA IN 5 OF 17 LYMPH NODES (5/17). - THE PROXIMAL AND DISTAL SURGICAL RESECTION MARGINS ARE NEGATIVE FOR ADENOCARCINOMA. - SEE ONCOLOGY TABLE BELOW. 2. Soft tissue, biopsy, Right periureteral tissue - ADENOCARCINOMA. 3. Soft tissue, biopsy, Right periureteral tissue - INFLAMED AND DEGENERATING FIBROADIPOSE TISSUE. - THERE IS NO EVIDENCE OF MALIGNANCY. Microscopic Comment 1. COLON AND RECTUM (INCLUDING TRANS-ANAL RESECTION): Specimen: Distal ileum, cecum, proximal ascending colon, and adherent sigmoid colon. Procedure: Resection. Tumor site: Multiple foci, including ileocecal valve, ascending colon and sigmoid. Specimen integrity: Disrupted. Macroscopic intactness of mesorectum: N/A Macroscopic tumor perforation: Present. Invasive tumor: Maximum size: 4.0 cm. Histologic type(s): Adenocarcinoma. Histologic grade and differentiation: G2: moderately differentiated Type of polyp in which invasive carcinoma arose: Tubulovillous adenomas. Microscopic extension of invasive tumor: Focally  involves the serosa. Lymph-Vascular invasion: Present. Peri-neural invasion: Not identified. Tumor deposit(s) (discontinuous extramural extension): Not identified. Resection margins: Proximal margin: 4.0 cm Distal margin: 3.5 cm Circumferential (radial) (posterior ascending, posterior descending; lateral and posterior mid-rectum; and entire lower 1/3 rectum): Cannot be assessed. Mesenteric margin (sigmoid and transverse): Cannot be assessed. Treatment effect (neo-adjuvant therapy): N/A Additional polyp(s): Not identified. Non-neoplastic findings: No significant findings. Lymph nodes: number examined 17; number positive: 5 Pathologic Staging: mpT4a, pN2a, pMX Ancillary studies: A block will be sent for MSI testing by [facility] and MSI testing by PCR and the results reported separately. Comment: There appear to be three synchronous primary tumors in the specimen, one at the ileocecal valve, one in the ascending colon, and one at the sigmoid colon. Each of these foci contain an associated tubulovillous adenoma, suggesting three separate primary sites. The tumor in the sigmoid colon appears to involve the serosa as well. The other two tumors show extension into at least the pericolonic soft tissue. Dr Mali Rund has reviewed selected slides and concurs that there are likely three synchronous tumors here. The case was discussed with Dr Hulen Skains. Per Dr  Wyatt, the specimen in #2 is a contiguous piece of tissue 2 of 4 Amended copy Amended FINAL for JASREET, DICKIE [phone]) Microscopic Comment(continued) with the main tumor, and therefore, not considered a distant metastatic focus. Therefore, the tumor is staged as an MX. (JBK:ecj 10/16/2014) JOSHUA  Mismatch Repair (MMR) Protein Immunohistochemistry (IHC) IHC Expression Result: MLH1: Preserved nuclear expression (greater 50% tumor expression) MSH2: Preserved nuclear expression (greater 50% tumor expression) MSH6: Preserved nuclear expression  (greater 50% tumor expression) PMS2: Preserved nuclear expression (greater 50% tumor expression) * Internal control demonstrates intact nuclear expression Interpretation: NORMAL There is preserved expression RADIOGRAPHIC STUDIES: I have personally reviewed the radiological images as listed and agreed with the findings in the report.  ASSESSMENT & PLAN:  54 year old Caucasian female with minimal past medical history, who was found to have 3 multifocal colon cancer at terminal ileum/cecum, ascending colon and sigmoid colon. All of them has adenocarcinoma,  And the cecum tumor has squamous differentiation. The cecum tumor has directly invaded the soft tissue along the right ureter, which was not completely resected (1% tumor left over, per Dr. Hulen Skains). She has mpT4aN2aM0 stage IIIB, MSI/MMR normal.   1. Multifocal stage IIIB colon cancer -I discussed her surgical past findings extensively with patient and her husband. Unfortunately, the tumor was not completely resected. She has very small amount of tumor left over. I have referred her to see radiation oncologist Dr. Lisbeth Renshaw. He was initially scheduled for tomorrow, but her husband is not able to come with her tomorrow morning and he was rescheduled for 11/20/2014. I'll ask Dr. Lisbeth Renshaw to see if he is able to see her earlier before that. -I discussed the adjuvant chemotherapy with her. Given her residual tumor after surgery, advanced stage IIIc disease, high risk recurrence in the future, I strongly recommend adjuvant chemotherapy with the standard regimen to FOLFOX. Side effects were discussed with patient in great detail. She agrees to proceed. She has a port placed when she was in the hospital.  -She is 4 weeks after surgery, recovering well from surgery, I can start her chemotherapy in a few weeks. -I'll discuss with Dr. Lisbeth Renshaw about the best sequence of chemotherapy and irradiation. I can potentially start her on chemotherapy first for a few months, then  start radiation with or without sensitizing chemotherapy 5-FU along, , then finish the rest of the adjuvant chemotherapy for total of 6 months. -I encouraged her to continue healthy diet nutrition supplement and being physically active, to recover better.  Plan,  -I will see if you can see Dr. Lisbeth Renshaw before 1/18 -chemo class and CT abdomen/pelvis on the day with Dr. Ida Rogue appointment -Tentatively I'll schedule your first chemo FOLFOX ON 1/21    All questions were answered. The patient knows to call the clinic with any problems, questions or concerns. I spent 40 minutes counseling the patient face to face. The total time spent in the appointment was 55 minutes and more than 50% was on counseling.     Truitt Merle, MD 11/07/2014 2:45 PM

## 2014-11-07 NOTE — Progress Notes (Signed)
Checked in new pt with no financial concerns at this time.  I informed pt that Raquel will contact her insurance to see if Josem Kaufmann is req for chemo if it's part of her treatment as well as contact foundations that offer copay assistance for chemo.  She has Raquel's card for any question or concerns.

## 2014-11-08 ENCOUNTER — Ambulatory Visit
Admission: RE | Admit: 2014-11-08 | Payer: BC Managed Care – PPO | Source: Ambulatory Visit | Admitting: Radiation Oncology

## 2014-11-08 ENCOUNTER — Ambulatory Visit: Payer: BC Managed Care – PPO

## 2014-11-08 LAB — URINALYSIS, ROUTINE W REFLEX MICROSCOPIC
Bilirubin Urine: NEGATIVE
Glucose, UA: NEGATIVE mg/dL
KETONES UR: NEGATIVE mg/dL
Nitrite: NEGATIVE
Protein, ur: 100 mg/dL — AB
Specific Gravity, Urine: 1.012 (ref 1.005–1.030)
UROBILINOGEN UA: 0.2 mg/dL (ref 0.0–1.0)
pH: 6 (ref 5.0–8.0)

## 2014-11-08 LAB — URINALYSIS, MICROSCOPIC ONLY
Casts: NONE SEEN
Squamous Epithelial / LPF: NONE SEEN

## 2014-11-09 LAB — URINE CULTURE: Colony Count: >=100000

## 2014-11-10 MED ORDER — ONDANSETRON HCL 8 MG PO TABS: 8.0000 mg | ORAL_TABLET | Freq: Three times a day (TID) | ORAL | Status: DC | PRN

## 2014-11-10 NOTE — Addendum Note (Signed)
Addended by: Truitt Merle on: 11/10/2014 08:35 AM   Modules accepted: Orders

## 2014-11-13 NOTE — Progress Notes (Signed)
GI Location of Tumor / Histology: Colon Cancer 1.5cm mass ascending colon and additional 1.5cm mass in mid sigmoid colon, ileum/cecum, Adenocarcinoma,   Allison Mitchell presented months ago with symptoms of: last October with Gi symptoms, abdominl pains, guaiac positive stools had colonoscopy  Biopsies of  (if applicable) revealed: Diagnosis 10/10/14: 1. Colon, segmental resection for tumor, Cecum, terminal ileum, sigmoid colon- MULTIFOCAL INVASIVE ADENOCARCINOMA, MODERATELY DIFFERENTIATED, THE LARGEST FOCUS SPANS 4.0 CM.- ADENOCARCINOMA INVOLVES SEROSA.- LYMPHOVASCULAR INVASION IS IDENTIFIED.- METASTATIC CARCINOMA IN 5 OF 17 LYMPH NODES (5/17).- THE PROXIMAL AND DISTAL SURGICAL RESECTION MARGINS ARE NEGATIVE [place].- SEE ONCOLOGY TABLE BELOW.2. Soft tissue, biopsy, Right periureteral tissue- ADENOCARCINOMA.3. Soft tissue, biopsy, Right periureteral tissue- INFLAMED AND DEGENERATING FIBROADIPOSE TISSUE.- THERE IS NO EVIDENCE OF MALIGNANCY.  Past/Anticipated interventions by surgeon, if any: 09/26/14 Colonoscopy   Right hemicolectomy with primary anastomosis with sigmoid colectomy with end colostomy 10/07/14,  Dr. Ulice Dash Wyatt)after cystoscopy on 10/06/14; to rule out GU invasion and a ureteral stent placement(Dr, Alexis Frock) Exploratory Laparotomy open Right Ureteral lysis,partial sigmoid colectomy colostomy ,partial right colectomy with removal of the terminal ilieum by Dr. Hulen Skains  and Dr. Tresa Moore 10/11/14 Port a cath placement 10/17/14 by Dr. Jeanne Ivan  Follow up with Dr. Hulen Skains 1/5/16had soltis labs for  dysuria , f/u 1st February,2016  Past/Anticipated interventions by medical oncology: Dr. Truitt Merle MD , CT abd/pelvis 11/14/14, Chemotherapy class 11/16/14 1st Folfox on 11/23/14 tentatviely  Weight changes, if any: stable  Bowel/Bladder complaints, if any: has colostomy, 10/16/14 Left upper quad, constipation, wil start taking metamucil prn, has dysuria when voiding and hematuria jas open  wound size dime ,using saline and dressing   Nausea / Vomiting, if any: none  Pain issues, if any:  Dysuria,  SAFETY ISSUES:No  Prior radiation? NO    Pacemaker/ICD? NO  Possible current pregnancy? NO  Is the patient on methotrexate? NO  Current Complaints / other details:  Married, 2 children,   Mother skin cancer,  Father Diabetes,  Hx colon cancer in her  Maternal grandmother and polyps in patients sister,,Paternal grandmother breast cancer,  non smoker, no alcohol or illicit drug use.,  Allergies: NKDA

## 2014-11-14 ENCOUNTER — Ambulatory Visit (HOSPITAL_COMMUNITY)
Admission: RE | Admit: 2014-11-14 | Discharge: 2014-11-14 | Disposition: A | Discharge status: Home / Self Care | Payer: BLUE CROSS/BLUE SHIELD | Source: Ambulatory Visit | Attending: Hematology | Admitting: Hematology

## 2014-11-14 ENCOUNTER — Encounter (HOSPITAL_COMMUNITY): Payer: Self-pay

## 2014-11-14 DIAGNOSIS — Z9889 Other specified postprocedural states: Secondary | ICD-10-CM | POA: Insufficient documentation

## 2014-11-14 DIAGNOSIS — C189 Malignant neoplasm of colon, unspecified: Secondary | ICD-10-CM | POA: Insufficient documentation

## 2014-11-14 MED ORDER — IOHEXOL 300 MG/ML  SOLN
100.0000 mL | Freq: Once | INTRAMUSCULAR | Status: AC | PRN
  Administered 2014-11-14: 100 mL via INTRAVENOUS

## 2014-11-15 ENCOUNTER — Ambulatory Visit
Admission: RE | Admit: 2014-11-15 | Discharge: 2014-11-15 | Disposition: A | Discharge status: Home / Self Care | Payer: BLUE CROSS/BLUE SHIELD | Source: Ambulatory Visit | Attending: Radiation Oncology | Admitting: Radiation Oncology

## 2014-11-15 ENCOUNTER — Encounter: Payer: Self-pay | Admitting: Radiation Oncology

## 2014-11-15 VITALS — BP 135/74 | HR 80 | Temp 98.3°F | Resp 20 | Ht 68.0 in | Wt 212.7 lb

## 2014-11-15 DIAGNOSIS — C189 Malignant neoplasm of colon, unspecified: Secondary | ICD-10-CM

## 2014-11-15 DIAGNOSIS — C19 Malignant neoplasm of rectosigmoid junction: Secondary | ICD-10-CM | POA: Diagnosis not present

## 2014-11-15 HISTORY — DX: Anxiety disorder, unspecified: F41.9

## 2014-11-15 NOTE — Progress Notes (Signed)
Please see the Nurse Progress Note in the MD Initial Consult Encounter for this patient. 

## 2014-11-16 ENCOUNTER — Other Ambulatory Visit (HOSPITAL_BASED_OUTPATIENT_CLINIC_OR_DEPARTMENT_OTHER): Payer: BLUE CROSS/BLUE SHIELD

## 2014-11-16 ENCOUNTER — Ambulatory Visit: Payer: BLUE CROSS/BLUE SHIELD

## 2014-11-16 DIAGNOSIS — C189 Malignant neoplasm of colon, unspecified: Secondary | ICD-10-CM

## 2014-11-16 DIAGNOSIS — C188 Malignant neoplasm of overlapping sites of colon: Secondary | ICD-10-CM

## 2014-11-16 LAB — COMPREHENSIVE METABOLIC PANEL (CC13)
ALK PHOS: 113 U/L (ref 40–150)
ALT: 16 U/L (ref 0–55)
AST: 16 U/L (ref 5–34)
Albumin: 3.6 g/dL (ref 3.5–5.0)
Anion Gap: 8 mEq/L (ref 3–11)
BILIRUBIN TOTAL: 0.34 mg/dL (ref 0.20–1.20)
BUN: 12.7 mg/dL (ref 7.0–26.0)
CO2: 26 mEq/L (ref 22–29)
CREATININE: 0.9 mg/dL (ref 0.6–1.1)
Calcium: 9 mg/dL (ref 8.4–10.4)
Chloride: 108 mEq/L (ref 98–109)
EGFR: 78 mL/min/{1.73_m2} — AB (ref >90)
GLUCOSE: 98 mg/dL (ref 70–140)
Potassium: 4 mEq/L (ref 3.5–5.1)
Sodium: 141 mEq/L (ref 136–145)
TOTAL PROTEIN: 6.9 g/dL (ref 6.4–8.3)

## 2014-11-16 LAB — CBC WITH DIFFERENTIAL/PLATELET
BASO%: 0.4 % (ref 0.0–2.0)
BASOS ABS: 0 10*3/uL (ref 0.0–0.1)
EOS ABS: 0.2 10*3/uL (ref 0.0–0.5)
EOS%: 2.3 % (ref 0.0–7.0)
HEMATOCRIT: 31.4 % — AB (ref 34.8–46.6)
HGB: 9.5 g/dL — ABNORMAL LOW (ref 11.6–15.9)
LYMPH%: 33.8 % (ref 14.0–49.7)
MCH: 21.4 pg — ABNORMAL LOW (ref 25.1–34.0)
MCHC: 30.3 g/dL — AB (ref 31.5–36.0)
MCV: 70.5 fL — ABNORMAL LOW (ref 79.5–101.0)
MONO#: 0.5 10*3/uL (ref 0.1–0.9)
MONO%: 7.4 % (ref 0.0–14.0)
NEUT%: 56.1 % (ref 38.4–76.8)
NEUTROS ABS: 4 10*3/uL (ref 1.5–6.5)
PLATELETS: 397 10*3/uL (ref 145–400)
RBC: 4.45 10*6/uL (ref 3.70–5.45)
RDW: 22.7 % — ABNORMAL HIGH (ref 11.2–14.5)
WBC: 7.2 10*3/uL (ref 3.9–10.3)
lymph#: 2.4 10*3/uL (ref 0.9–3.3)

## 2014-11-16 LAB — CEA: CEA: 0.8 ng/mL (ref 0.0–5.0)

## 2014-11-16 MED ORDER — LIDOCAINE-PRILOCAINE 2.5-2.5 % EX CREA: 1.0000 "application " | TOPICAL_CREAM | CUTANEOUS | Status: AC | PRN

## 2014-11-20 ENCOUNTER — Ambulatory Visit: Payer: BLUE CROSS/BLUE SHIELD

## 2014-11-20 ENCOUNTER — Ambulatory Visit: Payer: BLUE CROSS/BLUE SHIELD | Admitting: Radiation Oncology

## 2014-11-20 ENCOUNTER — Institutional Professional Consult (permissible substitution): Payer: BC Managed Care – PPO | Admitting: Radiation Oncology

## 2014-11-21 ENCOUNTER — Encounter: Payer: Self-pay | Admitting: Radiation Oncology

## 2014-11-21 ENCOUNTER — Telehealth: Payer: Self-pay | Admitting: *Deleted

## 2014-11-21 NOTE — Telephone Encounter (Signed)
CALLED PATIENT TO INFORM OF [facility] APPT. FOR 01-03-15- ARRIVAL TIME - 2:30 PM, SPOKE WITH PATIENT AND SHE IS AWARE OF THIS APPT.

## 2014-11-21 NOTE — Progress Notes (Signed)
Radiation Oncology         [phone] ________________________________  Name: Allison Mitchell MRN: [id number]  Date: 11/15/2014  DOB: 1961-05-02  BZ:JIRC,VELFY B, MD  Truitt Merle, MD     REFERRING PHYSICIAN: Truitt Merle, MD   DIAGNOSIS: The encounter diagnosis was Colon cancer.   HISTORY OF PRESENT ILLNESS::Allison Mitchell is a 54 y.o. female who is seen for an initial consultation visit regarding the patient's diagnosis of colon cancer.  The patient presented with some GI symptoms which began last October in 2014. She was diagnosed with ischemic colitis.  The patient had heme positive stools and therefore underwent a colonoscopy on 09/26/2014. Significantly, a 1.5 cm mass was seen within the ascending colon and an additional 1.5 cm mass was present in the mid sigmoid colon. Biopsies were obtained of these 2 areas as well as an inflamed/ulcerated mucosal region in the terminal ileum. All 3 of these areas returned positive for invasive adenocarcinoma with the sigmoid mass containing squamous cell differentiation.  The patient had some worsening symptoms and was hospitalized. A CT scan of the abdomen and pelvis revealed some soft tissue/bowel wall thickening involving the right lower pelvis centered at the seeing come/terminal ileum. This was felt to be causing bowel obstruction as well as obstruction of the distal right ureter with associated hydronephrosis. The patient also had a CT scan of the chest which did not reveal any evidence of metastatic disease. MRI scan of the liver did not reveal any hepatic metastatic disease.  The patient then underwent a right hemicolectomy with primary anastomosis with sigmoid colectomy with and colostomy on 10/07/2014. A cystoscopy had ruled out GU invasion and a ureteral stent was placed the previous day. At the time of surgery, it was noted that there was direct invasion into the tissue directly on top of the right iliac artery and vein. The right peri-ureteral  area was debulked which was adherent to the ureter and this was consistent with malignancy. Biopsy of this area was obtained. Pathology from this specimen revealed multifocal invasive adenocarcinoma involving the cecal/terminal ileum tumor/sigmoid colon. The largest tumor spanned 4.0 cm. Soft tissue biopsy from the right. He ureteral tissue revealed adenocarcinoma. Additional tissue from this area did not reveal malignancy. A total of 5 out of 17 lymph nodes were positive for carcinoma. The pathologic staging was [id number].  With no evidence of this disease this corresponds to stage IIIC disease.  The patient has been slowly recovering from her surgery. She was treated for surgical wound issue with antibiotics recently and is much improved. She has some abdominal discomfort but no significant nausea. Colostomy has been working well.    PREVIOUS RADIATION THERAPY: No   PAST MEDICAL HISTORY:  has a past medical history of GERD (gastroesophageal reflux disease); Depression; Colon cancer (10/10/14); and Anxiety.     PAST SURGICAL HISTORY: Past Surgical History  Procedure Laterality Date  . Cystoscopy with retrograde pyelogram, ureteroscopy and stent placement Right 10/05/2014    Procedure: [place], URETEROSCOPY  AND STENT PLACEMENT;  Surgeon: Alexis Frock, MD;  Location: [facility];  Service: Urology;  Laterality: Right;  . Laparotomy N/A 10/10/2014    Procedure: EXPLORATORY LAPAROTOMY;  Surgeon: Doreen Salvage, MD;  Location: [place];  Service: General;  Laterality: N/A;  . Partial colectomy  10/10/2014    Procedure: PARTIAL SIGMOID COLECTOMY;  Surgeon: Doreen Salvage, MD;  Location: [place];  Service: General;;  . Colostomy  10/10/2014    Procedure: COLOSTOMY;  Surgeon: Ulice Dash  Hulen Skains, MD;  Location: [place];  Service: General;;  . Colostomy revision  10/10/2014    Procedure: PARTIAL RIGHT COLECTOMY;  Surgeon: Doreen Salvage, MD;  Location: [place];  Service: General;;  . Ureteral reimplantion Right  10/10/2014    Procedure: OPEN RIGHT URETERAL LYSIS ;  Surgeon: Alexis Frock, MD;  Location: [place];  Service: Urology;  Laterality: Right;  . Portacath placement N/A 10/17/2014    Procedure: INSERTION PORT-A-CATH LEFT SUBCLAVIAN AND MIDLINE INCISION STAPLE REMOVAL ;  Surgeon: Coralie Keens, MD;  Location: [place];  Service: General;  Laterality: N/A;     FAMILY HISTORY: family history includes Cancer in her mother; Cancer (age of onset: 72) in her paternal grandmother; Cancer (age of onset: 61) in her maternal grandmother; Diabetes in her father.   SOCIAL HISTORY:  reports that she has never smoked. She has never used smokeless tobacco. She reports that she does not drink alcohol or use illicit drugs.   ALLERGIES: Review of patient's allergies indicates no known allergies.   MEDICATIONS:  Current Outpatient Prescriptions  Medication Sig Dispense Refill  . citalopram (CELEXA) 20 MG tablet Take 40 mg by mouth daily.  1  . omeprazole (PRILOSEC) 40 MG capsule Take 40 mg by mouth every morning.  12  . psyllium (METAMUCIL) 58.6 % powder Take 1 packet by mouth as needed (constipation).    Marland Kitchen zolpidem (AMBIEN) 5 MG tablet Take 1 tablet (5 mg total) by mouth at bedtime as needed for sleep. 20 tablet 0  . lidocaine-prilocaine (EMLA) cream Apply 1 application topically as needed. 30 g 0  . ondansetron (ZOFRAN) 8 MG tablet Take 1 tablet (8 mg total) by mouth every 8 (eight) hours as needed for nausea or vomiting. (Patient not taking: Reported on 11/15/2014) 20 tablet 0   No current facility-administered medications for this encounter.     REVIEW OF SYSTEMS:  A 15 point review of systems is documented in the electronic medical record. This was obtained by the nursing staff. However, I reviewed this with the patient to discuss relevant findings and make appropriate changes.  Pertinent items are noted in HPI.    PHYSICAL EXAM:  height is _0  (1.727 m) and weight is 212 lb 11.2 oz (96.48 kg). Her  oral temperature is 98.3 F (36.8 C). Her blood pressure is 135/74 and her pulse is 80. Her respiration is 20.   ECOG = 1  0 - Asymptomatic (Fully active, able to carry on all predisease activities without restriction)  1 - Symptomatic but completely ambulatory (Restricted in physically strenuous activity but ambulatory and able to carry out work of a light or sedentary nature. For example, light housework, office work)  2 - Symptomatic, <50% in bed during the day (Ambulatory and capable of all self care but unable to carry out any work activities. Up and about more than 50% of waking hours)  3 - Symptomatic, >50% in bed, but not bedbound (Capable of only limited self-care, confined to bed or chair 50% or more of waking hours)  4 - Bedbound (Completely disabled. Cannot carry on any self-care. Totally confined to bed or chair)  5 - Death   Eustace Pen MM, Creech RH, Tormey DC, et al. [phone]). "Toxicity and response criteria of the [facility]". [place] Oncol. 5 (6): 649-55  General: Well-developed, in no acute distress HEENT: Normocephalic, atraumatic; oral cavity clear Neck: Supple without any lymphadenopathy Cardiovascular: Regular rate and rhythm Respiratory: Clear to auscultation bilaterally GI:  Soft, nontender, normal bowel sounds, colostomy in place. Well-healing surgical wound with some gauze covering an area that continues to heal in the lower aspect.  Extremities: No edema present Neuro: No focal deficits     LABORATORY DATA:  Lab Results  Component Value Date   WBC 7.2 11/16/2014   HGB 9.5* 11/16/2014   HCT 31.4* 11/16/2014   MCV 70.5* 11/16/2014   PLT 397 11/16/2014   Lab Results  Component Value Date   NA 141 11/16/2014   K 4.0 11/16/2014   CL 109 10/14/2014   CO2 26 11/16/2014   Lab Results  Component Value Date   ALT 16 11/16/2014   AST 16 11/16/2014   ALKPHOS 113 11/16/2014   BILITOT 0.34 11/16/2014      RADIOGRAPHY: Ct Abdomen  Pelvis W Contrast  11/14/2014   CLINICAL DATA:  Followup colon carcinoma. Status post surgical resection and colostomy. Beginning chemotherapy.  EXAM: CT ABDOMEN AND PELVIS WITH CONTRAST  TECHNIQUE: Multidetector CT imaging of the abdomen and pelvis was performed using the standard protocol following bolus administration of intravenous contrast.  CONTRAST:  176m OMNIPAQUE IOHEXOL 300 MG/ML  SOLN  COMPARISON:  10/02/2014  FINDINGS: Lower Chest:  Unremarkable.  Hepatobiliary: No masses or other significant abnormality identified.  Pancreas: No mass, inflammatory changes, or other significant abnormality identified.  Spleen:  Within normal limits in size and appearance.  Adrenal Glands:  No mass identified.  Kidneys/Urinary Tract: No masses identified. Right ureteral stent is now seen in place with decreased hydronephrosis compared to previous study.  Stomach/Bowel/Peritoneum: Patient has undergone resection of previously seen right lower quadrant mass with left lower quadrant colostomy. No residual mass or bowel wall thickening visualized.  Vascular/Lymphatic: No pathologically enlarged lymph nodes identified. No other significant abnormality visualized.  Reproductive: No masses identified. A new complex fluid collection is seen in the right pelvis between the uterus and right ureter which measures approximately 2.9 x 5.9 cm on image 67/series 2. Postop abscess cannot be excluded.  Other:  None.  Musculoskeletal:  No suspicious bone lesions identified.  IMPRESSION: New complex fluid collection in the right pelvis measuring 2.9 x 5.9 cm. Postop abscess cannot be excluded.  Decrease right hydronephrosis with ureteral stent in appropriate position.  No evidence of abdominal or pelvic metastatic disease.   Electronically Signed   By: JEarle GellM.D.   On: 11/14/2014 09:00       IMPRESSION:    Colon cancer   10/04/2014 Tumor Marker CEA 1.0 .  tumor MSI stable and MMR normal    10/10/2014 Initial Diagnosis Colon  cancer   10/10/2014 Pathologic Stage mutifocal (3) colon adencarcinoma (one with squamous defferential) at cecum, ascending colon and sigmoid colon. [id number],stage IIIC.    10/10/2014 Surgery partial right hemicolectomy with removal of terminal ileum, sigmoid colectomy, right ureter lysis    The patient is status post a right hemicolectomy with a recent diagnosis of colon cancer. This was a locally advanced tumor with adherence to the tissue around the right iliac vessels as well as the right ureter. It was felt at the time of surgery that a small amount of tumor remained in the surgical region with respect to these areas. Biopsy of the tissue around the right ureter did return positive for malignancy. The patient has been seen by medical oncology and the patient is planning to proceed with chemotherapy postoperatively. Given the surgical findings, I believe that the patient is at significant risk for local failure and I have  recommended postoperative radiation treatment for the patient, likely with concurrent radiosensitizing chemotherapy.  I discussed with the patient therefore a likely 5-1/2-6 week course of radiation treatment. I discussed the rationale of this treatment to improve local control. We also discussed the possible side effects and risks of treatment in detail. All of her questions were answered today.   PLAN: The patient is continuing to heal. I have discussed her case with medical oncology. We are in agreement that it would be reasonable to proceed with a couple of cycles of chemotherapy to be followed by concurrent chemoradiation treatment. I discussed this with the patient and she is in agreement. I look forward to seeing the patient after she has received some chemotherapy to see how she is doing at that time and to further discuss and plan for postoperative radiation treatment.    I spent 60 minutes face to face with the patient and more than 50% of that time was spent in  counseling and/or coordination of care.    ________________________________   Jodelle Gross, MD, PhD   **Disclaimer: This note was dictated with voice recognition software. Similar sounding words can inadvertently be transcribed and this note may contain transcription errors which may not have been corrected upon publication of note.**

## 2014-11-23 ENCOUNTER — Ambulatory Visit (HOSPITAL_BASED_OUTPATIENT_CLINIC_OR_DEPARTMENT_OTHER): Payer: BLUE CROSS/BLUE SHIELD

## 2014-11-23 ENCOUNTER — Telehealth: Payer: Self-pay | Admitting: Hematology

## 2014-11-23 ENCOUNTER — Encounter: Payer: Self-pay | Admitting: Hematology

## 2014-11-23 ENCOUNTER — Telehealth: Payer: Self-pay | Admitting: *Deleted

## 2014-11-23 ENCOUNTER — Ambulatory Visit (HOSPITAL_BASED_OUTPATIENT_CLINIC_OR_DEPARTMENT_OTHER): Payer: BLUE CROSS/BLUE SHIELD | Admitting: Hematology

## 2014-11-23 VITALS — BP 134/74 | HR 85 | Temp 98.8°F | Resp 18 | Ht 68.0 in | Wt 211.6 lb

## 2014-11-23 DIAGNOSIS — C19 Malignant neoplasm of rectosigmoid junction: Secondary | ICD-10-CM

## 2014-11-23 DIAGNOSIS — D509 Iron deficiency anemia, unspecified: Secondary | ICD-10-CM

## 2014-11-23 DIAGNOSIS — C180 Malignant neoplasm of cecum: Secondary | ICD-10-CM

## 2014-11-23 DIAGNOSIS — C182 Malignant neoplasm of ascending colon: Secondary | ICD-10-CM

## 2014-11-23 DIAGNOSIS — C187 Malignant neoplasm of sigmoid colon: Secondary | ICD-10-CM | POA: Diagnosis not present

## 2014-11-23 DIAGNOSIS — Z5111 Encounter for antineoplastic chemotherapy: Secondary | ICD-10-CM | POA: Diagnosis not present

## 2014-11-23 DIAGNOSIS — D649 Anemia, unspecified: Secondary | ICD-10-CM | POA: Insufficient documentation

## 2014-11-23 DIAGNOSIS — R300 Dysuria: Secondary | ICD-10-CM

## 2014-11-23 DIAGNOSIS — D500 Iron deficiency anemia secondary to blood loss (chronic): Secondary | ICD-10-CM

## 2014-11-23 LAB — CBC & DIFF AND RETIC
BASO%: 0.6 % (ref 0.0–2.0)
Basophils Absolute: 0 10*3/uL (ref 0.0–0.1)
EOS%: 2 % (ref 0.0–7.0)
Eosinophils Absolute: 0.1 10*3/uL (ref 0.0–0.5)
HCT: 29 % — ABNORMAL LOW (ref 34.8–46.6)
HEMOGLOBIN: 8.5 g/dL — AB (ref 11.6–15.9)
Immature Retic Fract: 21.7 % — ABNORMAL HIGH (ref 1.60–10.00)
LYMPH%: 29.2 % (ref 14.0–49.7)
MCH: 21.5 pg — ABNORMAL LOW (ref 25.1–34.0)
MCHC: 29.3 g/dL — ABNORMAL LOW (ref 31.5–36.0)
MCV: 73.2 fL — ABNORMAL LOW (ref 79.5–101.0)
MONO#: 0.5 10*3/uL (ref 0.1–0.9)
MONO%: 9.4 % (ref 0.0–14.0)
NEUT%: 58.8 % (ref 38.4–76.8)
NEUTROS ABS: 3.2 10*3/uL (ref 1.5–6.5)
PLATELETS: 242 10*3/uL (ref 145–400)
RBC: 3.96 10*6/uL (ref 3.70–5.45)
RDW: 21.1 % — ABNORMAL HIGH (ref 11.2–14.5)
Retic %: 1.59 % (ref 0.70–2.10)
Retic Ct Abs: 62.96 10*3/uL (ref 33.70–90.70)
WBC: 5.5 10*3/uL (ref 3.9–10.3)
lymph#: 1.6 10*3/uL (ref 0.9–3.3)

## 2014-11-23 LAB — FERRITIN CHCC: FERRITIN: 14 ng/mL (ref 9–269)

## 2014-11-23 LAB — IRON AND TIBC CHCC
%SAT: 4 % — AB (ref 21–57)
Iron: 14 ug/dL — ABNORMAL LOW (ref 41–142)
TIBC: 365 ug/dL (ref 236–444)
UIBC: 351 ug/dL (ref 120–384)

## 2014-11-23 MED ORDER — DEXAMETHASONE SODIUM PHOSPHATE 10 MG/ML IJ SOLN
INTRAMUSCULAR | Status: AC
  Filled 2014-11-23: qty 1

## 2014-11-23 MED ORDER — CIPROFLOXACIN HCL 250 MG PO TABS: 250.0000 mg | ORAL_TABLET | Freq: Two times a day (BID) | ORAL | Status: DC

## 2014-11-23 MED ORDER — LORAZEPAM 1 MG PO TABS
1.0000 mg | ORAL_TABLET | Freq: Once | ORAL | Status: AC
  Administered 2014-11-23: 1 mg via ORAL

## 2014-11-23 MED ORDER — ONDANSETRON 8 MG/NS 50 ML IVPB
INTRAVENOUS | Status: AC
  Filled 2014-11-23: qty 8

## 2014-11-23 MED ORDER — DEXTROSE 5 % IV SOLN
Freq: Once | INTRAVENOUS | Status: AC
  Administered 2014-11-23: 11:00:00 via INTRAVENOUS

## 2014-11-23 MED ORDER — OXALIPLATIN CHEMO INJECTION 100 MG/20ML
85.0000 mg/m2 | Freq: Once | INTRAVENOUS | Status: AC
  Administered 2014-11-23: 185 mg via INTRAVENOUS
  Filled 2014-11-23: qty 37

## 2014-11-23 MED ORDER — FLUOROURACIL CHEMO INJECTION 2.5 GM/50ML
400.0000 mg/m2 | Freq: Once | INTRAVENOUS | Status: AC
  Administered 2014-11-23: 850 mg via INTRAVENOUS
  Filled 2014-11-23: qty 17

## 2014-11-23 MED ORDER — ONDANSETRON 8 MG/50ML IVPB (CHCC)
8.0000 mg | Freq: Once | INTRAVENOUS | Status: AC
  Administered 2014-11-23: 8 mg via INTRAVENOUS

## 2014-11-23 MED ORDER — LORAZEPAM 1 MG PO TABS
ORAL_TABLET | ORAL | Status: AC
  Filled 2014-11-23: qty 1

## 2014-11-23 MED ORDER — SODIUM CHLORIDE 0.9 % IV SOLN
2400.0000 mg/m2 | INTRAVENOUS | Status: DC
  Administered 2014-11-23: 5150 mg via INTRAVENOUS
  Filled 2014-11-23: qty 103

## 2014-11-23 MED ORDER — LEUCOVORIN CALCIUM INJECTION 350 MG
400.0000 mg/m2 | Freq: Once | INTRAMUSCULAR | Status: AC
  Administered 2014-11-23: 860 mg via INTRAVENOUS
  Filled 2014-11-23: qty 43

## 2014-11-23 MED ORDER — DEXAMETHASONE SODIUM PHOSPHATE 10 MG/ML IJ SOLN
10.0000 mg | Freq: Once | INTRAMUSCULAR | Status: AC
  Administered 2014-11-23: 10 mg via INTRAVENOUS

## 2014-11-23 NOTE — Telephone Encounter (Signed)
1/21 pof complete. left message for pt re next appt for 2/4. avs/schedule mailed.

## 2014-11-23 NOTE — Progress Notes (Signed)
[place]  Telephone:(336) [phone] Fax:([phone]) [place] Note   Patient Care Team: [name], MD as PCP - General (Family Medicine) 11/23/2014  CHIEF COMPLAINTS/PURPOSE OF CONSULTATION:  Synchronized three colon cancer, stage III    Colon cancer   10/04/2014 Tumor Marker CEA 1.0 .  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 mpT4aN2aM0,stage IIIC.    10/10/2014 Surgery partial right hemicolectomy with removal of terminal ileum, sigmoid colectomy, right ureter lysis   CURRENT THERAPY: FOLFOX every 2 weeks, started on 11/23/2014, plan for 12 cycles.   HISTORY OF PRESENTING ILLNESS:  Allison Mitchell 54 y.o. female is here for follow-up after her hospital discharge. She was recently diagnosed with 3 synchronous to colon cancer, status post surgical resection. I initially saw her when she was diagnosed in the hospital.  She presented with some GI symptoms since last October 2014 at which time she was diagnosed with ischemic colitis. Due to guaiac-positive stools, patient had undergone an outpatient colonoscopy on 09/26/2014, which showed inflamed and ulcerated mucosa in the terminal ileum, and a 1.5 cm mass in the ascending colon and additional 1.5 cm mass in the mid sigmoid colon. All biopsy from the above 3 sites came back positive for invasive moderate differentiated adenocarcinoma, and the sigmoid mass containing squamous cell differentiation.   She was hospitalized for worsening of her symptoms on 10/03/2014.  A CT of the abdomen and pelvis with contrast on 10/02/2014 was remarkable for soft tissue/bowel wall thickening of the right lower pelvis center at the cecum-terminal ileum involving a portion of the adjacent sigmoid colon. This causes small bowel obstruction and obstructed the  distal right ureter causing moderate to severe right hydroureteronephrosis. She was [facility] to [facility] for surgical management and further testing. CT of the chest with contrast on 10/03/2014 negative for metastatic disease. MRI of the liver without and without contrast on 10/03/2014 is remarkable for small hepatic cysts, but no findings suspicious for hepatic metastatic disease. Right-sided hydroureteronephrosis was once again seen, with early small bowel obstruction bowel gas pattern.  She underwent right hemicolectomy with primary anastomosis with sigmoid colectomy with end colostomy on Friday, Dec 5, after cystoscopy is performed on 12/4 to rule out GU invasion and a ureteral stent placement.  She has family history of colon cancer in her grandmother and polyps in her sister. Denies risk factors for HIV or hepatitis. Denies Diabetes. No Tobacco. No ETOH. Denies prior radiation. SHe did consume significant amount of red meat until recently. We are asked to see the patient in consultation, with recommendations on the oncology standpoint  She is recovering slowly after the surgery. She had the surgical suture opened about 3 weeks ago and was treated for antibiotics , and now much improved. She has no significant pain, but mild abdominal discomfort, no nausea, her stool is getting thicker, she clean her colostomy bag daily. She was seen by Dr. Hulen Skains this morning. She noticed some cloudy urine in the past few days, with mild burning sensation, no fever or back pain. Her appetite is getting better, eats well.  She lost about 25 lbs. No fever or chills. No other new complains.   INTERIM HISTORY: Allison Mitchell returns for follow up and first cycle chemo. She continues recovering from her surgery, has more energy now. Her abdominal  wound is still open, but much smaller now, she is changing dressing daily. She denies any abdominal pain, nausea or diarrhea. She still has burning  sensation when urinates. No hematuria, or urinary frequency.   MEDICAL HISTORY:  Past Medical History  Diagnosis Date  . GERD (gastroesophageal reflux disease)   . Depression   . Colon cancer 10/10/14    colon adenocarcinoma  . Anxiety     SURGICAL HISTORY: Past Surgical History  Procedure Laterality Date  . Cystoscopy with retrograde pyelogram, ureteroscopy and stent placement Right 10/05/2014    Procedure: [place], URETEROSCOPY  AND STENT PLACEMENT;  Surgeon: Alexis Frock, MD;  Location: [facility];  Service: Urology;  Laterality: Right;  . Laparotomy N/A 10/10/2014    Procedure: EXPLORATORY LAPAROTOMY;  Surgeon: Doreen Salvage, MD;  Location: [place];  Service: General;  Laterality: N/A;  . Partial colectomy  10/10/2014    Procedure: PARTIAL SIGMOID COLECTOMY;  Surgeon: Doreen Salvage, MD;  Location: [place];  Service: General;;  . Colostomy  10/10/2014    Procedure: COLOSTOMY;  Surgeon: Doreen Salvage, MD;  Location: [place];  Service: General;;  . Colostomy revision  10/10/2014    Procedure: PARTIAL RIGHT COLECTOMY;  Surgeon: Doreen Salvage, MD;  Location: [place];  Service: General;;  . Ureteral reimplantion Right 10/10/2014    Procedure: OPEN RIGHT URETERAL LYSIS ;  Surgeon: Alexis Frock, MD;  Location: [place];  Service: Urology;  Laterality: Right;  . Portacath placement N/A 10/17/2014    Procedure: INSERTION PORT-A-CATH LEFT SUBCLAVIAN AND MIDLINE INCISION STAPLE REMOVAL ;  Surgeon: Coralie Keens, MD;  Location: [place];  Service: General;  Laterality: N/A;    SOCIAL HISTORY: History   Social History  . Marital Status: Married    Spouse Name: N/A    Number of Children: N/A  . Years of Education: N/A   Occupational History  . Not on file.   Social History Main Topics  . Smoking status: Never Smoker   . Smokeless tobacco: Never Used  . Alcohol Use: No  . Drug Use: No  . Sexual Activity: No   Other Topics Concern  . Not on file   Social History Narrative     FAMILY HISTORY: Family History  Problem Relation Age of Onset  . Cancer Mother     skin cancer   . Diabetes Father   . Cancer Maternal Grandmother 16    colon cancer   . Cancer Paternal Grandmother 50    breast cancer     ALLERGIES:  has No Known Allergies.  MEDICATIONS:  Current Outpatient Prescriptions  Medication Sig Dispense Refill  . citalopram (CELEXA) 20 MG tablet Take 40 mg by mouth daily.  1  . lidocaine-prilocaine (EMLA) cream Apply 1 application topically as needed. 30 g 0  . omeprazole (PRILOSEC) 40 MG capsule Take 40 mg by mouth every morning.  12  . ondansetron (ZOFRAN) 8 MG tablet Take 1 tablet (8 mg total) by mouth every 8 (eight) hours as needed for nausea or vomiting. (Patient not taking: Reported on 11/15/2014) 20 tablet 0  . psyllium (METAMUCIL) 58.6 % powder Take 1 packet by mouth as needed (constipation).    Marland Kitchen zolpidem (AMBIEN) 5 MG tablet Take 1 tablet (5 mg total) by mouth at bedtime as needed for sleep. 20 tablet 0   No current facility-administered medications for this visit.    REVIEW OF SYSTEMS:   Constitutional: Denies fevers, chills or abnormal night sweats Eyes: Denies blurriness of  vision, double vision or watery eyes Ears, nose, mouth, throat, and face: Denies mucositis or sore throat Respiratory: Denies cough, dyspnea or wheezes Cardiovascular: Denies palpitation, chest discomfort or lower extremity swelling Gastrointestinal:  Denies nausea, heartburn or change in bowel habits Skin: Denies abnormal skin rashes Lymphatics: Denies new lymphadenopathy or easy bruising Neurological:Denies numbness, tingling or new weaknesses Behavioral/Psych: Mood is stable, no new changes  All other systems were reviewed with the patient and are negative.  PHYSICAL EXAMINATION: ECOG PERFORMANCE STATUS: 1 - Symptomatic but completely ambulatory  There were no vitals filed for this visit. There were no vitals filed for this visit.  GENERAL:alert, no  distress and comfortable SKIN: skin color, texture, turgor are normal, no rashes or significant lesions EYES: normal, conjunctiva are pink and non-injected, sclera clear OROPHARYNX:no exudate, no erythema and lips, buccal mucosa, and tongue normal  NECK: supple, thyroid normal size, non-tender, without nodularity LYMPH:  no palpable lymphadenopathy in the cervical, axillary or inguinal LUNGS: clear to auscultation and percussion with normal breathing effort HEART: regular rate & rhythm and no murmurs and no lower extremity edema ABDOMEN:abdomen soft, non-tender and normal bowel sounds. Positive for colostomy back on the left, with normal-appearing stool. Surgical wound upper part is well-healed, lower part is covered by gauze. Musculoskeletal:no cyanosis of digits and no clubbing  PSYCH: alert & oriented x 3 with fluent speech NEURO: no focal motor/sensory deficits  LABORATORY DATA:  I have reviewed the data as listed Lab Results  Component Value Date   WBC 7.2 11/16/2014   HGB 9.5* 11/16/2014   HCT 31.4* 11/16/2014   MCV 70.5* 11/16/2014   PLT 397 11/16/2014    Recent Labs  10/12/14 0430 10/13/14 0915 10/14/14 0436 11/16/14 0935  NA 134* 140 144 141  K 5.0 3.9 3.6* 4.0  CL 100 104 109  --   CO2 21 25 19 26   GLUCOSE 133* 109* 88 98  BUN 16 9 8  12.7  CREATININE 0.87 0.61 0.60 0.9  CALCIUM 8.4 8.1* 8.3* 9.0  GFRNONAA 75* >90 >90  --   GFRAA 87* >90 >90  --   PROT  --   --   --  6.9  ALBUMIN  --   --   --  3.6  AST  --   --   --  16  ALT  --   --   --  16  ALKPHOS  --   --   --  113  BILITOT  --   --   --  0.34   Iron and TIBC [facility]  Status: Finalresult Visible to patient:  Not Released Nextappt: 12/07/2014 at 09:45 AM in Oncology [facility] Lab 1) Dx:  Colorectal cancer, stage III            Ref Range 10:42 AM    Iron 41 - 142 ug/dL 14 (L)   TIBC 236 - 444 ug/dL 365   UIBC 120 - 384 ug/dL 351   %SAT 21 - 57 % 4 (L)      Ferritin   Status: Finalresult Visible to patient:  Not Released Nextappt: 12/07/2014 at 09:45 AM in Oncology [facility] Lab 1) Dx:  Colorectal cancer, stage III         Ref Range 10:42 AM    Ferritin 9 - 269 ng/ml 14       Surgical path 10/10/2014 1. Colon, segmental resection for tumor, Cecum, terminal ileum, sigmoid colon - MULTIFOCAL INVASIVE ADENOCARCINOMA, MODERATELY DIFFERENTIATED, THE LARGEST FOCUS SPANS 4.0  CM. - ADENOCARCINOMA INVOLVES SEROSA. - LYMPHOVASCULAR INVASION IS IDENTIFIED. - METASTATIC CARCINOMA IN 5 OF 17 LYMPH NODES (5/17). - THE PROXIMAL AND DISTAL SURGICAL RESECTION MARGINS ARE NEGATIVE FOR ADENOCARCINOMA. - SEE ONCOLOGY TABLE BELOW. 2. Soft tissue, biopsy, Right periureteral tissue - ADENOCARCINOMA. 3. Soft tissue, biopsy, Right periureteral tissue - INFLAMED AND DEGENERATING FIBROADIPOSE TISSUE. - THERE IS NO EVIDENCE OF MALIGNANCY. Microscopic Comment 1. COLON AND RECTUM (INCLUDING TRANS-ANAL RESECTION): Specimen: Distal ileum, cecum, proximal ascending colon, and adherent sigmoid colon. Procedure: Resection. Tumor site: Multiple foci, including ileocecal valve, ascending colon and sigmoid. Specimen integrity: Disrupted. Macroscopic intactness of mesorectum: N/A Macroscopic tumor perforation: Present. Invasive tumor: Maximum size: 4.0 cm. Histologic type(s): Adenocarcinoma. Histologic grade and differentiation: G2: moderately differentiated Type of polyp in which invasive carcinoma arose: Tubulovillous adenomas. Microscopic extension of invasive tumor: Focally involves the serosa. Lymph-Vascular invasion: Present. Peri-neural invasion: Not identified. Tumor deposit(s) (discontinuous extramural extension): Not identified. Resection margins: Proximal margin: 4.0 cm Distal margin: 3.5 cm Circumferential (radial) (posterior ascending, posterior descending; lateral and posterior mid-rectum; and entire lower 1/3 rectum): Cannot be  assessed. Mesenteric margin (sigmoid and transverse): Cannot be assessed. Treatment effect (neo-adjuvant therapy): N/A Additional polyp(s): Not identified. Non-neoplastic findings: No significant findings. Lymph nodes: number examined 17; number positive: 5 Pathologic Staging: mpT4a, pN2a, pMX Ancillary studies: A block will be sent for MSI testing by [facility] and MSI testing by PCR and the results reported separately. Comment: There appear to be three synchronous primary tumors in the specimen, one at the ileocecal valve, one in the ascending colon, and one at the sigmoid colon. Each of these foci contain an associated tubulovillous adenoma, suggesting three separate primary sites. The tumor in the sigmoid colon appears to involve the serosa as well. The other two tumors show extension into at least the pericolonic soft tissue. Dr Mali Rund has reviewed selected slides and concurs that there are likely three synchronous tumors here. The case was discussed with Dr Hulen Skains. Per Dr Hulen Skains, the specimen in #2 is a contiguous piece of tissue 2 of 4 Amended copy Amended FINAL for Allison Mitchell, Allison Mitchell [phone]) Microscopic Comment(continued) with the main tumor, and therefore, not considered a distant metastatic focus. Therefore, the tumor is staged as an MX. (JBK:ecj 10/16/2014) JOSHUA  Mismatch Repair (MMR) Protein Immunohistochemistry (IHC) IHC Expression Result: MLH1: Preserved nuclear expression (greater 50% tumor expression) MSH2: Preserved nuclear expression (greater 50% tumor expression) MSH6: Preserved nuclear expression (greater 50% tumor expression) PMS2: Preserved nuclear expression (greater 50% tumor expression) * Internal control demonstrates intact nuclear expression Interpretation: NORMAL There is preserved expression  RADIOGRAPHIC STUDIES: I have personally reviewed the radiological images as listed and agreed with the findings in the report.  CT of abdomen and pelvis  11/14/2014 New complex fluid collection in the right pelvis measuring 2.9 x 5.9cm. Postop abscess cannot be excluded.  Decrease right hydronephrosis with ureteral stent in appropriate position.  No evidence of abdominal or pelvic metastatic disease.   ASSESSMENT & PLAN:  54 year old Caucasian female with minimal past medical history, who was found to have 3 multifocal colon cancer at terminal ileum/cecum, ascending colon and sigmoid colon. All of them has adenocarcinoma,  And the cecum tumor has squamous differentiation. The cecum tumor has directly invaded the soft tissue along the right ureter, which was not completely resected (1% tumor left over, per Dr. Hulen Skains). She has mpT4aN2aM0 stage IIIB, MSI/MMR normal.   1. Multifocal stage IIIB colon cancer -I discussed her surgical past findings extensively with patient and  her husband. Unfortunately, the tumor was not completely resected. She has very small amount of tumor left over. -Given her residual tumor after surgery, advanced stage IIIc disease, high risk recurrence in the future, I strongly recommend adjuvant chemotherapy with the standard regimen FOLFOX. Side effects were discussed with patient in great detail. She agrees to proceed. She is starting today. -I reviewed  -I'll discuss with his radiation oncologist Dr. Lisbeth Renshaw about the case sequence of chemotherapy and irradiation. Both of [place] agreed to start chemotherapy for a few months first, then start radiation with or without sensitizing chemotherapy 5-FU, , then finish the rest of the adjuvant chemotherapy for total of 6 months. -I encouraged her to continue healthy diet nutrition supplement and being physically active, to recover better.  2. Right pelvic cyst -I reviewed her recent CT of abdomen and pelvis with her and her husband. -This is likely a seroma secondary to surgery. Giving her afebrile, normal white count, no abdominal pain or tenderness, this is unlikely a abscess. -I  have discussed with her surgeon Dr. Hulen Skains, he agrees with close monitoring.  3. Dysuria -She had recent urine culture which was negative. However she has persistent dysuria. -I give her a course of Cipro 250 mg twice a day for total of 3 days to see if her symptoms improve. If not, I encouraged her to call urologist Dr. Tresa Moore for follow-up. She had a right ureter stent placement during her colon cancer surgery.  4. Open surgical wound -No signs of infection. Continue dressing change and follow-up with Dr. Hulen Skains.  5. 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 I checked her iron study today which showed iron 14, saturation 4% and ferritin 14, this is consistent with iron deficient anemia -This is secondary to the bleeding from her colon cancer. -Given her recent GI surgery, I'll give her IV Feraheme 532m twice in the next few weeks. - Plan, -Start first cycle FOLFOX today -If her neutropenia does not resolve before her second cycle, I would consider adding on Neulasta from next cycle   All questions were answered. The patient knows to call the clinic with any problems, questions or concerns. I spent 30 minutes counseling the patient face to face. The total time spent in the appointment was 40 minutes and more than 50% was on counseling.     FTruitt Merle MD 11/23/2014 7:33 AM

## 2014-11-23 NOTE — Telephone Encounter (Signed)
Called Ostomy RN & left message that pt expressed low self confidence in taking care of her ostomy & could use some more training/assistance & requested a return call.

## 2014-11-23 NOTE — Patient Instructions (Addendum)
[place] Discharge Instructions for Patients Receiving Chemotherapy  BEWARE OF COLD OUTSIDE, PUT SCARF OVER YOUR MOUTH TO KEEP FROM BREATHING IN COLD AIR.  Today you received the following chemotherapy agents OXALIPLATIN, LEUCOVORIN, 5FU PUSH, 5FU PUMP  To help prevent nausea and vomiting after your treatment, we encourage you to take your nausea medication STARTING TONIGHT ABOUT 5 PM AND CONTINUE TOMORROW IF NEEDED.   If you develop nausea and vomiting that is not controlled by your nausea medication, call the clinic.   BELOW ARE SYMPTOMS THAT SHOULD BE REPORTED IMMEDIATELY:  *FEVER GREATER THAN 100.5 F  *CHILLS WITH OR WITHOUT FEVER  NAUSEA AND VOMITING THAT IS NOT CONTROLLED WITH YOUR NAUSEA MEDICATION  *UNUSUAL SHORTNESS OF BREATH  *UNUSUAL BRUISING OR BLEEDING  TENDERNESS IN MOUTH AND THROAT WITH OR WITHOUT PRESENCE OF ULCERS  *URINARY PROBLEMS  *BOWEL PROBLEMS  UNUSUAL RASH Items with * indicate a potential emergency and should be followed up as soon as possible.  Feel free to call the clinic you have any questions or concerns. The clinic phone number is [phone].  Infusion room phone number [phone] if any questions about flushing port

## 2014-11-24 ENCOUNTER — Other Ambulatory Visit: Payer: Self-pay | Admitting: *Deleted

## 2014-11-24 ENCOUNTER — Telehealth: Payer: Self-pay | Admitting: *Deleted

## 2014-11-24 DIAGNOSIS — C19 Malignant neoplasm of rectosigmoid junction: Secondary | ICD-10-CM

## 2014-11-24 MED ORDER — TRAMADOL HCL 50 MG PO TABS: 50.0000 mg | ORAL_TABLET | Freq: Four times a day (QID) | ORAL | Status: DC | PRN

## 2014-11-24 NOTE — Telephone Encounter (Signed)
Have spoken with patient three times today related to symptoms with chemotherapy F/U.  As of 12:20 pm "I feel better and do not think I need to go to the ER.  I am checking my temperature and do not have a fever.  Have not had BM yet." Verbal orders received and read back from Dr. Burr Medico for patient to come in if needed or gp to the ER.  Take Miralax or MOM for bowels, ultram 50 mg for pain.  Have weekend nurse contact her tomorrow when she comes in for pump D/C if needs to be seen. Patient has taken her own supply of hydrocodone at 0950 am for moderate pain rated as 7 that started at 0800.  At 11:15 rated pain as 3 or 4.  Reviewed with this call instructions to report to nearest ER for fever, emesis, no bm or pain.  No further emesis since 0500 which was clear with undigested green beans from dinner.  Assured me she feels better and understands when to go to the ER.  Will call to report if her bowels move today.  With inclement weather will notify call team to notify on call provider.

## 2014-11-24 NOTE — Telephone Encounter (Signed)
Patient called reporting she was "awake at 3:00 am with pain towards ovaries radiating to right side around to back.  Paint has returned now at 0800 to upper right upper abdomen and back.  I didn't take my nausea medicine until 5:00 am and threw it up.  Just took another zofran and it stayed down.  I ate yesterday but no appetite yet today.  Do I need to come in?   LBM was 11-22-2014 through colostomy.  Reports "bowels are so hard they push my colostomy bag off.  Took Miralax yesterday".  Drinking approximately 32 oz water and juice.  Denies temperature or chills.  Lives in [place] and can be reached at [phone].  Will notify Dr. Burr Medico.  This nurse suggested 64 oz water daily and hot broth, teas, decaf coffee to promote better bowel regulation.

## 2014-11-24 NOTE — Telephone Encounter (Signed)
-----   Message from Janace Hoard, RN sent at 11/23/2014  4:27 PM EST ----- Regarding: chemo follow up Folfox, no problems with infusion. Very nervous at beginning. Dr Burr Medico

## 2014-11-24 NOTE — Telephone Encounter (Signed)
I spoke with triage nurse today. We returned her calls.  Truitt Merle

## 2014-11-25 ENCOUNTER — Ambulatory Visit (HOSPITAL_BASED_OUTPATIENT_CLINIC_OR_DEPARTMENT_OTHER): Payer: BLUE CROSS/BLUE SHIELD

## 2014-11-25 ENCOUNTER — Other Ambulatory Visit: Payer: Self-pay | Admitting: Hematology

## 2014-11-25 VITALS — BP 108/67 | HR 82 | Temp 99.0°F | Resp 18

## 2014-11-25 DIAGNOSIS — C19 Malignant neoplasm of rectosigmoid junction: Secondary | ICD-10-CM

## 2014-11-25 DIAGNOSIS — E860 Dehydration: Secondary | ICD-10-CM

## 2014-11-25 MED ORDER — SODIUM CHLORIDE 0.9 % IJ SOLN
10.0000 mL | INTRAMUSCULAR | Status: DC | PRN
  Administered 2014-11-25: 10 mL
  Filled 2014-11-25: qty 10

## 2014-11-25 MED ORDER — HEPARIN SOD (PORK) LOCK FLUSH 100 UNIT/ML IV SOLN
500.0000 [IU] | Freq: Once | INTRAVENOUS | Status: AC | PRN
  Administered 2014-11-25: 500 [IU]
  Filled 2014-11-25: qty 5

## 2014-11-25 MED ORDER — HYDROCODONE-ACETAMINOPHEN 5-325 MG PO TABS: 1.0000 | ORAL_TABLET | Freq: Four times a day (QID) | ORAL | Status: DC | PRN

## 2014-11-25 MED ORDER — SODIUM CHLORIDE 0.9 % IV SOLN
Freq: Once | INTRAVENOUS | Status: AC
  Administered 2014-11-25: 11:00:00 via INTRAVENOUS

## 2014-11-28 ENCOUNTER — Other Ambulatory Visit: Payer: Self-pay

## 2014-11-28 ENCOUNTER — Emergency Department (HOSPITAL_COMMUNITY): Payer: BLUE CROSS/BLUE SHIELD

## 2014-11-28 ENCOUNTER — Inpatient Hospital Stay (HOSPITAL_COMMUNITY)
Admission: EM | Admit: 2014-11-28 | Discharge: 2014-11-30 | DRG: 176 | Disposition: A | Discharge status: Home / Self Care | Payer: BLUE CROSS/BLUE SHIELD | Attending: Internal Medicine | Admitting: Internal Medicine

## 2014-11-28 ENCOUNTER — Telehealth: Payer: Self-pay | Admitting: *Deleted

## 2014-11-28 ENCOUNTER — Encounter (HOSPITAL_COMMUNITY): Payer: Self-pay | Admitting: Emergency Medicine

## 2014-11-28 ENCOUNTER — Ambulatory Visit (HOSPITAL_BASED_OUTPATIENT_CLINIC_OR_DEPARTMENT_OTHER): Payer: BLUE CROSS/BLUE SHIELD | Admitting: Nurse Practitioner

## 2014-11-28 ENCOUNTER — Other Ambulatory Visit (HOSPITAL_BASED_OUTPATIENT_CLINIC_OR_DEPARTMENT_OTHER): Payer: BLUE CROSS/BLUE SHIELD

## 2014-11-28 VITALS — BP 126/47 | HR 60 | Temp 98.0°F | Resp 20 | Ht 68.0 in | Wt 206.5 lb

## 2014-11-28 DIAGNOSIS — C19 Malignant neoplasm of rectosigmoid junction: Secondary | ICD-10-CM

## 2014-11-28 DIAGNOSIS — C189 Malignant neoplasm of colon, unspecified: Secondary | ICD-10-CM

## 2014-11-28 DIAGNOSIS — D638 Anemia in other chronic diseases classified elsewhere: Secondary | ICD-10-CM | POA: Diagnosis present

## 2014-11-28 DIAGNOSIS — K219 Gastro-esophageal reflux disease without esophagitis: Secondary | ICD-10-CM | POA: Diagnosis present

## 2014-11-28 DIAGNOSIS — Z933 Colostomy status: Secondary | ICD-10-CM | POA: Diagnosis not present

## 2014-11-28 DIAGNOSIS — Z888 Allergy status to other drugs, medicaments and biological substances status: Secondary | ICD-10-CM | POA: Diagnosis not present

## 2014-11-28 DIAGNOSIS — R319 Hematuria, unspecified: Secondary | ICD-10-CM | POA: Diagnosis present

## 2014-11-28 DIAGNOSIS — Z9049 Acquired absence of other specified parts of digestive tract: Secondary | ICD-10-CM | POA: Diagnosis present

## 2014-11-28 DIAGNOSIS — R000 Tachycardia, unspecified: Secondary | ICD-10-CM

## 2014-11-28 DIAGNOSIS — I2699 Other pulmonary embolism without acute cor pulmonale: Secondary | ICD-10-CM | POA: Diagnosis present

## 2014-11-28 DIAGNOSIS — R079 Chest pain, unspecified: Secondary | ICD-10-CM | POA: Diagnosis not present

## 2014-11-28 DIAGNOSIS — F329 Major depressive disorder, single episode, unspecified: Secondary | ICD-10-CM | POA: Diagnosis present

## 2014-11-28 DIAGNOSIS — D509 Iron deficiency anemia, unspecified: Secondary | ICD-10-CM | POA: Diagnosis present

## 2014-11-28 DIAGNOSIS — N390 Urinary tract infection, site not specified: Secondary | ICD-10-CM | POA: Diagnosis present

## 2014-11-28 DIAGNOSIS — Z881 Allergy status to other antibiotic agents status: Secondary | ICD-10-CM | POA: Diagnosis not present

## 2014-11-28 DIAGNOSIS — R0600 Dyspnea, unspecified: Secondary | ICD-10-CM | POA: Diagnosis present

## 2014-11-28 DIAGNOSIS — E669 Obesity, unspecified: Secondary | ICD-10-CM | POA: Diagnosis present

## 2014-11-28 DIAGNOSIS — D6481 Anemia due to antineoplastic chemotherapy: Secondary | ICD-10-CM | POA: Diagnosis present

## 2014-11-28 DIAGNOSIS — C180 Malignant neoplasm of cecum: Secondary | ICD-10-CM

## 2014-11-28 DIAGNOSIS — Z6831 Body mass index (BMI) 31.0-31.9, adult: Secondary | ICD-10-CM

## 2014-11-28 DIAGNOSIS — F419 Anxiety disorder, unspecified: Secondary | ICD-10-CM | POA: Diagnosis present

## 2014-11-28 LAB — PROTIME-INR
INR: 1.06 (ref 0.00–1.49)
Prothrombin Time: 13.9 seconds (ref 11.6–15.2)

## 2014-11-28 LAB — COMPREHENSIVE METABOLIC PANEL (CC13)
ALT: 48 U/L (ref 0–55)
AST: 34 U/L (ref 5–34)
Albumin: 3.9 g/dL (ref 3.5–5.0)
Alkaline Phosphatase: 120 U/L (ref 40–150)
Anion Gap: 13 mEq/L — ABNORMAL HIGH (ref 3–11)
BUN: 12.3 mg/dL (ref 7.0–26.0)
CALCIUM: 9 mg/dL (ref 8.4–10.4)
CO2: 20 mEq/L — ABNORMAL LOW (ref 22–29)
Chloride: 105 mEq/L (ref 98–109)
Creatinine: 0.8 mg/dL (ref 0.6–1.1)
EGFR: 79 mL/min/{1.73_m2} — ABNORMAL LOW (ref >90)
Glucose: 101 mg/dl (ref 70–140)
Potassium: 4.1 mEq/L (ref 3.5–5.1)
Sodium: 138 mEq/L (ref 136–145)
Total Bilirubin: 0.7 mg/dL (ref 0.20–1.20)
Total Protein: 7.2 g/dL (ref 6.4–8.3)

## 2014-11-28 LAB — APTT: aPTT: 31 seconds (ref 24–37)

## 2014-11-28 LAB — TROPONIN I: Troponin I: <0.03 ng/mL (ref <0.031)

## 2014-11-28 LAB — CBC WITH DIFFERENTIAL/PLATELET
BASO%: 1.4 % (ref 0.0–2.0)
Basophils Absolute: 0.1 10*3/uL (ref 0.0–0.1)
EOS ABS: 0.1 10*3/uL (ref 0.0–0.5)
EOS%: 1.5 % (ref 0.0–7.0)
HCT: 34.2 % — ABNORMAL LOW (ref 34.8–46.6)
HGB: 10 g/dL — ABNORMAL LOW (ref 11.6–15.9)
LYMPH%: 32.2 % (ref 14.0–49.7)
MCH: 20.7 pg — ABNORMAL LOW (ref 25.1–34.0)
MCHC: 29.3 g/dL — ABNORMAL LOW (ref 31.5–36.0)
MCV: 70.5 fL — ABNORMAL LOW (ref 79.5–101.0)
MONO#: 0.3 10*3/uL (ref 0.1–0.9)
MONO%: 3.7 % (ref 0.0–14.0)
NEUT#: 4.5 10*3/uL (ref 1.5–6.5)
NEUT%: 61.2 % (ref 38.4–76.8)
Platelets: 251 10*3/uL (ref 145–400)
RBC: 4.86 10*6/uL (ref 3.70–5.45)
RDW: 22.6 % — ABNORMAL HIGH (ref 11.2–14.5)
WBC: 7.3 10*3/uL (ref 3.9–10.3)
lymph#: 2.3 10*3/uL (ref 0.9–3.3)

## 2014-11-28 LAB — HOLD TUBE, BLOOD BANK

## 2014-11-28 MED ORDER — SODIUM CHLORIDE 0.9 % IV SOLN: INTRAVENOUS | Status: DC

## 2014-11-28 MED ORDER — MAGNESIUM HYDROXIDE 400 MG/5ML PO SUSP: 5.0000 mL | Freq: Every day | ORAL | Status: DC | PRN

## 2014-11-28 MED ORDER — PANTOPRAZOLE SODIUM 40 MG PO TBEC
40.0000 mg | DELAYED_RELEASE_TABLET | Freq: Every day | ORAL | Status: DC
  Administered 2014-11-29 – 2014-11-30 (×2): 40 mg via ORAL
  Filled 2014-11-28 (×2): qty 1

## 2014-11-28 MED ORDER — TRAMADOL HCL 50 MG PO TABS: 50.0000 mg | ORAL_TABLET | Freq: Four times a day (QID) | ORAL | Status: DC | PRN

## 2014-11-28 MED ORDER — IOHEXOL 350 MG/ML SOLN
100.0000 mL | Freq: Once | INTRAVENOUS | Status: AC | PRN
  Administered 2014-11-28: 100 mL via INTRAVENOUS

## 2014-11-28 MED ORDER — CITALOPRAM HYDROBROMIDE 40 MG PO TABS
40.0000 mg | ORAL_TABLET | Freq: Every day | ORAL | Status: DC
  Administered 2014-11-29 – 2014-11-30 (×2): 40 mg via ORAL
  Filled 2014-11-28 (×3): qty 1

## 2014-11-28 MED ORDER — ZOLPIDEM TARTRATE 5 MG PO TABS: 5.0000 mg | ORAL_TABLET | Freq: Every evening | ORAL | Status: DC | PRN

## 2014-11-28 MED ORDER — ONDANSETRON HCL 4 MG/2ML IJ SOLN: 4.0000 mg | Freq: Four times a day (QID) | INTRAMUSCULAR | Status: DC | PRN

## 2014-11-28 MED ORDER — ENOXAPARIN SODIUM 100 MG/ML ~~LOC~~ SOLN
95.0000 mg | SUBCUTANEOUS | Status: AC
  Administered 2014-11-28: 95 mg via SUBCUTANEOUS
  Filled 2014-11-28: qty 1

## 2014-11-28 MED ORDER — SODIUM CHLORIDE 0.9 % IV SOLN
1000.0000 mL | INTRAVENOUS | Status: DC
  Administered 2014-11-28: 1000 mL via INTRAVENOUS

## 2014-11-28 MED ORDER — ONDANSETRON HCL 4 MG PO TABS: 4.0000 mg | ORAL_TABLET | Freq: Four times a day (QID) | ORAL | Status: DC | PRN

## 2014-11-28 MED ORDER — PSYLLIUM 95 % PO PACK
1.0000 | PACK | ORAL | Status: DC | PRN
  Filled 2014-11-28: qty 1

## 2014-11-28 MED ORDER — HYDROCODONE-ACETAMINOPHEN 5-325 MG PO TABS
1.0000 | ORAL_TABLET | Freq: Four times a day (QID) | ORAL | Status: DC | PRN
  Administered 2014-11-29: 1 via ORAL
  Filled 2014-11-28: qty 1

## 2014-11-28 MED ORDER — NITROGLYCERIN 0.4 MG SL SUBL
0.4000 mg | SUBLINGUAL_TABLET | SUBLINGUAL | Status: DC | PRN
  Filled 2014-11-28: qty 1

## 2014-11-28 MED ORDER — MORPHINE SULFATE 2 MG/ML IJ SOLN: 1.0000 mg | INTRAMUSCULAR | Status: DC | PRN

## 2014-11-28 MED ORDER — ASPIRIN 81 MG PO CHEW
324.0000 mg | CHEWABLE_TABLET | Freq: Once | ORAL | Status: AC
  Administered 2014-11-28: 324 mg via ORAL
  Filled 2014-11-28: qty 4

## 2014-11-28 MED ORDER — ENOXAPARIN SODIUM 100 MG/ML ~~LOC~~ SOLN
95.0000 mg | Freq: Two times a day (BID) | SUBCUTANEOUS | Status: DC
  Administered 2014-11-29 – 2014-11-30 (×3): 95 mg via SUBCUTANEOUS
  Filled 2014-11-28 (×5): qty 1

## 2014-11-28 NOTE — Progress Notes (Signed)
Late entry: Pt was taken to ER by wheelchair A&O x 3 with nurse and husband.  Pt was handoff to ER nurse Barnett Applebaum after report.

## 2014-11-28 NOTE — ED Provider Notes (Signed)
CSN: [id number]     Arrival date & time 11/28/14  1404 History   First MD Initiated Contact with Patient 11/28/14 1505     Chief Complaint  Patient presents with  . Chest Pain    chemo card   Patient is a 54 y.o. female presenting with chest pain. The history is provided by the patient.  Chest Pain Pain location:  Substernal area Pain quality: pressure   Pain radiates to:  Mid back and neck Pain radiates to the back: yes   Pain severity:  Moderate Onset quality:  Sudden Duration:  6 hours Timing:  Constant Progression:  Unchanged Chronicity:  New Worsened by:  Deep breathing Associated symptoms: cough and nausea   Associated symptoms: no fever, no shortness of breath and not vomiting   Risk factors: no coronary artery disease and no prior DVT/PE   Hx of colon ca.  Started chemo on Thursday. Father with history of heart disease.  In his 2s.  Had an MI and has 7 stents  Past Medical History  Diagnosis Date  . GERD (gastroesophageal reflux disease)   . Depression   . Colon cancer 10/10/14    colon adenocarcinoma  . Anxiety    Past Surgical History  Procedure Laterality Date  . Cystoscopy with retrograde pyelogram, ureteroscopy and stent placement Right 10/05/2014    Procedure: [place], URETEROSCOPY  AND STENT PLACEMENT;  Surgeon: Alexis Frock, MD;  Location: [facility];  Service: Urology;  Laterality: Right;  . Laparotomy N/A 10/10/2014    Procedure: EXPLORATORY LAPAROTOMY;  Surgeon: Doreen Salvage, MD;  Location: [place];  Service: General;  Laterality: N/A;  . Partial colectomy  10/10/2014    Procedure: PARTIAL SIGMOID COLECTOMY;  Surgeon: Doreen Salvage, MD;  Location: [place];  Service: General;;  . Colostomy  10/10/2014    Procedure: COLOSTOMY;  Surgeon: Doreen Salvage, MD;  Location: [place];  Service: General;;  . Colostomy revision  10/10/2014    Procedure: PARTIAL RIGHT COLECTOMY;  Surgeon: Doreen Salvage, MD;  Location: [place];  Service: General;;  . Ureteral  reimplantion Right 10/10/2014    Procedure: OPEN RIGHT URETERAL LYSIS ;  Surgeon: Alexis Frock, MD;  Location: [place];  Service: Urology;  Laterality: Right;  . Portacath placement N/A 10/17/2014    Procedure: INSERTION PORT-A-CATH LEFT SUBCLAVIAN AND MIDLINE INCISION STAPLE REMOVAL ;  Surgeon: Coralie Keens, MD;  Location[facility];  Service: General;  Laterality: N/A;   Family History  Problem Relation Age of Onset  . Cancer Mother     skin cancer   . Diabetes Father   . CAD Father     Onset in his 63s, has 7 stents  . Cancer Maternal Grandmother 53    colon cancer   . Cancer Paternal Grandmother 21    breast cancer    History  Substance Use Topics  . Smoking status: Never Smoker   . Smokeless tobacco: Never Used  . Alcohol Use: No   OB History    No data available     Review of Systems  Constitutional: Negative for fever.  Respiratory: Positive for cough. Negative for shortness of breath.   Cardiovascular: Positive for chest pain.  Gastrointestinal: Positive for nausea. Negative for vomiting.      Allergies  Acyclovir and related and Amoxicillin  Home Medications   Prior to Admission medications   Medication Sig Start Date End Date Taking? Authorizing Provider  citalopram (CELEXA) 20 MG tablet Take 40 mg by  mouth daily. 09/18/14  Yes Historical Provider, MD  HYDROcodone-acetaminophen (NORCO/VICODIN) 5-325 MG per tablet Take 1 tablet by mouth every 6 (six) hours as needed for moderate pain. 11/25/14  Yes Truitt Merle, MD  lidocaine-prilocaine (EMLA) cream Apply 1 application topically as needed. 11/16/14  Yes Truitt Merle, MD  magnesium hydroxide (MILK OF MAGNESIA) 400 MG/5ML suspension Take 5 mLs by mouth daily as needed for mild constipation.   Yes Historical Provider, MD  omeprazole (PRILOSEC) 40 MG capsule Take 40 mg by mouth every morning. 09/18/14  Yes Historical Provider, MD  ondansetron (ZOFRAN) 8 MG tablet Take 1 tablet (8 mg total) by mouth every 8 (eight) hours as  needed for nausea or vomiting. 11/10/14  Yes Truitt Merle, MD  phenazopyridine (PYRIDIUM) 200 MG tablet Take 200 mg by mouth 2 (two) times daily as needed (uti).  11/07/14  Yes Historical Provider, MD  [facility]   Yes Historical Provider, MD  psyllium (METAMUCIL) 58.6 % powder Take 1 packet by mouth as needed (constipation).   Yes Historical Provider, MD  traMADol (ULTRAM) 50 MG tablet Take 1 tablet (50 mg total) by mouth every 6 (six) hours as needed. 11/24/14  Yes Truitt Merle, MD  zolpidem (AMBIEN) 5 MG tablet Take 1 tablet (5 mg total) by mouth at bedtime as needed for sleep. 10/18/14  Yes Saverio Danker, PA-C  ciprofloxacin (CIPRO) 250 MG tablet Take 1 tablet (250 mg total) by mouth 2 (two) times daily. Patient not taking: Reported on 11/28/2014 11/23/14   Truitt Merle, MD   BP 140/81 mmHg  Pulse 98  Temp(Src) 99 F (37.2 C) (Oral)  Resp 20  SpO2 97% Physical Exam  Constitutional: She appears well-developed and well-nourished. No distress.  HENT:  Head: Normocephalic and atraumatic.  Right Ear: External ear normal.  Left Ear: External ear normal.  Eyes: Conjunctivae are normal. Right eye exhibits no discharge. Left eye exhibits no discharge. No scleral icterus.  Neck: Neck supple. No tracheal deviation present.  Cardiovascular: Normal rate, regular rhythm and intact distal pulses.   Pulmonary/Chest: Effort normal and breath sounds normal. No stridor. No respiratory distress. She has no wheezes. She has no rales.  Abdominal: Soft. Bowel sounds are normal. She exhibits no distension. There is no tenderness. There is no rebound and no guarding.  Musculoskeletal: She exhibits no edema or tenderness.  Neurological: She is alert. She has normal strength. No cranial nerve deficit (no facial droop, extraocular movements intact, no slurred speech) or sensory deficit. She exhibits normal muscle tone. She displays no seizure activity. Coordination normal.  Skin: Skin is warm and dry. No  rash noted.  Psychiatric: She has a normal mood and affect.  Nursing note and vitals reviewed.   ED Course  Procedures (including critical care time) Labs Review Labs Reviewed  APTT  PROTIME-INR  TROPONIN I  Labs from cancer center reviewed  Imaging Review Dg Chest 2 View  11/28/2014   CLINICAL DATA:  Shortness of breath after chemotherapy last Thursday  EXAM: CHEST  2 VIEW  COMPARISON:  10/17/2014  FINDINGS: Left-sided Port-A-Cath in satisfactory position. There is no focal parenchymal opacity, pleural effusion, or pneumothorax. The heart and mediastinal contours are unremarkable.  The osseous structures are unremarkable.  IMPRESSION: No active cardiopulmonary disease.   Electronically Signed   By: Kathreen Devoid   On: 11/28/2014 15:34   Ct Angio Chest Pe W/cm &/or Wo Cm  11/28/2014   CLINICAL DATA:  Chest pain since this morning  EXAM: CT ANGIOGRAPHY CHEST WITH CONTRAST  TECHNIQUE: Multidetector CT imaging of the chest was performed using the standard protocol during bolus administration of intravenous contrast. Multiplanar CT image reconstructions and MIPs were obtained to evaluate the vascular anatomy.  CONTRAST:  143mL OMNIPAQUE IOHEXOL 350 MG/ML SOLN  COMPARISON:  None.  FINDINGS: The study is positive for acute pulmonary thromboembolism. There is a small amount of thrombus in the peripheral main right pulmonary artery extending into a segmental branch of the right upper lobe on image 117 of series 10, superior segment of the right lower lobe on image 134, and in basal segments on image 171 through 179. No evidence of left-sided acute pulmonary thromboembolism. Right ventricle to left ventricle ratio is 0.85. There is no evidence of right heart strain.  No evidence of aortic dissection or aneurysm. Great vessels are patent.  Left subclavian vein Port-A-Cath is present. Tip is at the cavoatrial junction.  No evidence of abnormal mediastinal adenopathy.  No pneumothorax.  No pleural effusion.   Small benign appearing pleural plaques over the right hemidiaphragm. No pulmonary parenchymal nodules.  No acute bony deformity.  There is an enhancing structure within the hilum of the spleen. Splenic artery aneurysm is not excluded. It is 12 mm in diameter.  Review of the MIP images confirms the above findings.  IMPRESSION: The study is positive for pulmonary thromboembolism with and primarily segmental branches of the right lung. Critical Value/emergent results were called by telephone at the time of interpretation on 11/28/2014 at 4:40 pm to Dr. Dorie Rank , who verbally acknowledged these results.  Possible small splenic artery aneurysm. CTA of the abdomen can be performed to further characterize.   Electronically Signed   By: Maryclare Bean M.D.   On: 11/28/2014 16:40    EKG Interpretation  Date/Time:  Tuesday November 28 2014 15:30:57 EST Ventricular Rate:  83 PR Interval:  158 QRS Duration: 73 QT Interval:  379 QTC Calculation: 445 R Axis:   39 Text Interpretation:  Sinus rhythm Abnormal R-wave progression, early transition Since last tracing rate slower Confirmed by Emiliano Welshans  MD-J, Nikolay Demetriou ([id number]) on 11/28/2014 3:40:42 PM       EKG performed at the Cancer center.  Results reviewed by me. MDM   Final diagnoses:  PE (pulmonary embolism)    Pt has a PE on CT of her chest.  Will give dose of lovenox.  Admit for monitoring.  No signs of heart strain at least by CT. Currently not tachycardiac and BP is stable.  Ultimately seems to be a good candidate for xatelto    Dorie Rank, MD 11/28/14 1704

## 2014-11-28 NOTE — ED Notes (Signed)
Bed: ZT86 Expected date:  Expected time:  Means of arrival:  Comments: Cancer center-chest pressure

## 2014-11-28 NOTE — ED Notes (Signed)
MD Knapp at bedside 

## 2014-11-28 NOTE — Progress Notes (Signed)
Patient finishing dinner and resting in bed.  Mid lower abdominal dressing is CD & I.  Repositioned pillows on bed.  Patient has no complaints of pain.

## 2014-11-28 NOTE — ED Notes (Signed)
Pt transported to CT ?

## 2014-11-28 NOTE — H&P (Signed)
[name] History and Physical  Allison Mitchell EUM:[id number] DOB: [birth year] DOA: 11/28/2014  Referring physician: ED physician PCP: [name], MD   Chief Complaint: dyspnea and chest discomfort   HPI:  Pt is very pleasant 54 yo female who was recently diagnosed with invasive moderate differentiated adenocarcinoma (per colonoscopy in 09/2014 where three different masses noted: terminal ileum, 1.5 cm mass in the ascending colon, additional 1.5 cm mass in the mid sigmoid colon). She was also recently hospitalized 10/2014 for persistent abd pain, had SBO and obstructed distal right ureter causing moderate to severe right hydroureteronephrosis, now status post right hemicolectomy with primary anastomosis with sigmoid colectomy with end colostomy, ureteral stent placement (done Friday, Oct 07, 2014). She presented to [facility] ED with main concern of sudden onset of exertional dyspnea which progressed to dyspnea at rest, associated with substernal chest discomfort, intermittent in nature and 5/10 in severity when present with no radiating symptoms, no specific alleviating of aggravating factors, no similar events in the past. Pt denies fevers, chills, abd or urinary concerns. She reports no problems with colostomy, continue wound care and dressing changes twice daily.   In ED, pt is hemodynamically stable, VSS with low grade fever 62F, CXR with no cardiopulmonary etiologies. CT angio chest with new pulmonary embolism. Pharmacy consulted for Lovenox dosing and [facility] asked to admit for further evaluation.   Assessment and Plan: Active Problems: Acute pulmonary embolism - in pt with known malignancy - d/w family options of AC - agreeable to continuing Lovenox  - monitor blood counts for signs of bleeding - education on [facility] provided and pt and husband verbalized understanding  Colon caner - status post colectomy and colostomy as noted in HPI - will notify primary oncologist Dr. Burr Medico of pt 's  admission  - continue wound care and dressing changes BID  Anemia of chronic disease, colon cancer - monitor for signs of bleeding - repeat CBC in AM  Radiological Exams on Admission: Dg Chest 2 View  11/28/2014    No active cardiopulmonary disease.    Ct Angio Chest Pe 11/28/2014 Positive for pulmonary thromboembolism with and primarily segmental branches of the right lung.   Code Status: Full Family Communication: Pt and hat bedside Disposition Plan: Admit for further evaluation    Mart Piggs [facility] [phone]  Review of Systems:  Constitutional: Negative for diaphoresis.  HENT: Negative for hearing loss, ear pain, nosebleeds, congestion, sore throat, neck pain, tinnitus and ear discharge.   Eyes: Negative for blurred vision, double vision, photophobia, pain, discharge and redness.  Respiratory: Negative for cough,wheezing and stridor.   Cardiovascular: Negative for claudication and leg swelling.  Gastrointestinal: Negative for heartburn, constipation, blood in stool and melena.  Genitourinary: Negative for dysuria, urgency, frequency, hematuria and flank pain.  Musculoskeletal: Negative for myalgias, back pain, joint pain and falls.  Skin: Negative for itching and rash.  Neurological: Negative for dizziness and weakness.  Endo/Heme/Allergies: Negative for environmental allergies and polydipsia. Does not bruise/bleed easily.  Psychiatric/Behavioral: Negative for suicidal ideas. The patient is not nervous/anxious.      Past Medical History  Diagnosis Date  . GERD (gastroesophageal reflux disease)   . Depression   . Colon cancer 10/10/14    colon adenocarcinoma  . Anxiety     Past Surgical History  Procedure Laterality Date  . Cystoscopy with retrograde pyelogram, ureteroscopy and stent placement Right 10/05/2014    Procedure: [place], URETEROSCOPY  AND STENT PLACEMENT;  Surgeon: Alexis Frock, MD;  Location:  [facility];  Service: Urology;  Laterality:  Right;  . Laparotomy N/A 10/10/2014    Procedure: EXPLORATORY LAPAROTOMY;  Surgeon: Doreen Salvage, MD;  Location: [place];  Service: General;  Laterality: N/A;  . Partial colectomy  10/10/2014    Procedure: PARTIAL SIGMOID COLECTOMY;  Surgeon: Doreen Salvage, MD;  Location: [place];  Service: General;;  . Colostomy  10/10/2014    Procedure: COLOSTOMY;  Surgeon: Doreen Salvage, MD;  Location: [place];  Service: General;;  . Colostomy revision  10/10/2014    Procedure: PARTIAL RIGHT COLECTOMY;  Surgeon: Doreen Salvage, MD;  Location: [place];  Service: General;;  . Ureteral reimplantion Right 10/10/2014    Procedure: OPEN RIGHT URETERAL LYSIS ;  Surgeon: Alexis Frock, MD;  Location: [place];  Service: Urology;  Laterality: Right;  . Portacath placement N/A 10/17/2014    Procedure: INSERTION PORT-A-CATH LEFT SUBCLAVIAN AND MIDLINE INCISION STAPLE REMOVAL ;  Surgeon: Coralie Keens, MD;  Location: [place];  Service: General;  Laterality: N/A;    Social History:  reports that she has never smoked. She has never used smokeless tobacco. She reports that she does not drink alcohol or use illicit drugs.  Allergies  Allergen Reactions  . Acyclovir And Related   . Amoxicillin     Yeast infection    Family History  Problem Relation Age of Onset  . Cancer Mother     skin cancer   . Diabetes Father   . CAD Father     Onset in his 27s, has 7 stents  . Cancer Maternal Grandmother 37    colon cancer   . Cancer Paternal Grandmother 25    breast cancer     Prior to Admission medications   Medication Sig Start Date End Date Taking? Authorizing Provider  citalopram (CELEXA) 20 MG tablet Take 40 mg by mouth daily. 09/18/14  Yes Historical Provider, MD  HYDROcodone-acetaminophen (NORCO/VICODIN) 5-325 MG per tablet Take 1 tablet by mouth every 6 (six) hours as needed for moderate pain. 11/25/14  Yes Truitt Merle, MD  lidocaine-prilocaine (EMLA) cream Apply 1 application topically as needed. 11/16/14  Yes Truitt Merle, MD  magnesium  hydroxide (MILK OF MAGNESIA) 400 MG/5ML suspension Take 5 mLs by mouth daily as needed for mild constipation.   Yes Historical Provider, MD  omeprazole (PRILOSEC) 40 MG capsule Take 40 mg by mouth every morning. 09/18/14  Yes Historical Provider, MD  ondansetron (ZOFRAN) 8 MG tablet Take 1 tablet (8 mg total) by mouth every 8 (eight) hours as needed for nausea or vomiting. 11/10/14  Yes Truitt Merle, MD  phenazopyridine (PYRIDIUM) 200 MG tablet Take 200 mg by mouth 2 (two) times daily as needed (uti).  11/07/14  Yes Historical Provider, MD  [place] [facility]   Yes Historical Provider, MD  psyllium (METAMUCIL) 58.6 % powder Take 1 packet by mouth as needed (constipation).   Yes Historical Provider, MD  traMADol (ULTRAM) 50 MG tablet Take 1 tablet (50 mg total) by mouth every 6 (six) hours as needed. 11/24/14  Yes Truitt Merle, MD  zolpidem (AMBIEN) 5 MG tablet Take 1 tablet (5 mg total) by mouth at bedtime as needed for sleep. 10/18/14  Yes Saverio Danker, PA-C  ciprofloxacin (CIPRO) 250 MG tablet Take 1 tablet (250 mg total) by mouth 2 (two) times daily. Patient not taking: Reported on 11/28/2014 11/23/14   Truitt Merle, MD    Physical Exam: Filed Vitals:   11/28/14 1413 11/28/14 1702  BP: 140/81 139/66  Pulse: 98 86  Temp: 99 F (37.2 C)   TempSrc: Oral   Resp: 20 18  SpO2: 97% 100%    Physical Exam  Constitutional: Appears well-developed and well-nourished. No distress.  HENT: Normocephalic. External right and left ear normal. Oropharynx is clear and moist.  Eyes: Conjunctivae and EOM are normal. PERRLA, no scleral icterus.  Neck: Normal ROM. Neck supple. No JVD. No tracheal deviation. No thyromegaly.  CVS: RRR, S1/S2 +, no murmurs, no gallops, no carotid bruit.  Pulmonary: Effort and breath sounds normal, no stridor, rhonchi, wheezes, rales.  Abdominal: Soft. BS +,  no distension, colostomy bag  Musculoskeletal: Normal range of motion. No edema and no tenderness.  Lymphadenopathy:  No lymphadenopathy noted, cervical, inguinal. Neuro: Alert. Normal reflexes, muscle tone coordination. No cranial nerve deficit. Skin: Skin is warm and dry. No rash noted. Not diaphoretic. No erythema. No pallor.  Psychiatric: Normal mood and affect. Behavior, judgment, thought content normal.   Labs on Admission:  Basic Metabolic Panel:  Recent Labs Lab 11/28/14 1213  NA 138  K 4.1  CO2 20*  GLUCOSE 101  BUN 12.3  CREATININE 0.8  CALCIUM 9.0   Liver Function Tests:  Recent Labs Lab 11/28/14 1213  AST 34  ALT 48  ALKPHOS 120  BILITOT 0.70  PROT 7.2  ALBUMIN 3.9   CBC:  Recent Labs Lab 11/23/14 1042 11/28/14 1212  WBC 5.5 7.3  NEUTROABS 3.2 4.5  HGB 8.5* 10.0*  HCT 29.0* 34.2*  MCV 73.2* 70.5*  PLT 242 251    EKG: Normal sinus rhythm, no ST/T wave changes   If 7PM-7AM, please contact night-coverage [email or web address] Password [facility] 11/28/2014, 5:09 PM

## 2014-11-28 NOTE — Progress Notes (Signed)
ANTICOAGULATION CONSULT NOTE - Initial Consult  Pharmacy Consult for Lovenox Indication: Suspected Pulmonary Embolism  Allergies  Allergen Reactions  . Acyclovir And Related   . Amoxicillin     Yeast infection    Patient Measurements: Height : 68 inches Weight : 93.7 kg  Vital Signs: Temp: 99 F (37.2 C) (01/26 1413) Temp Source: Oral (01/26 1413) BP: 140/81 mmHg (01/26 1413) Pulse Rate: 98 (01/26 1413)  Labs:  Recent Labs  11/28/14 1212 11/28/14 1213 11/28/14 1522  HGB 10.0*  --   --   HCT 34.2*  --   --   PLT 251  --   --   APTT  --   --  31  LABPROT  --   --  13.9  INR  --   --  1.06  CREATININE  --  0.8  --     Estimated Creatinine Clearance: 97.3 mL/min (by C-G formula based on Cr of 0.8).   Medical History: Past Medical History  Diagnosis Date  . GERD (gastroesophageal reflux disease)   . Depression   . Colon cancer 10/10/14    colon adenocarcinoma  . Anxiety     Medications:  Scheduled:  . enoxaparin (LOVENOX) injection  95 mg Subcutaneous STAT   Infusions:  . sodium chloride 1,000 mL (11/28/14 1546)    Assessment:  54 yr female with colon cancer, undergoing chemotherapy  With complaint of chest pressure and shortness of breath  CT Angio pending  Pharmacy consulted to dose Lovenox for suspected Pulmonary Embolism  Goal of Therapy:   Monitor platelets by anticoagulation protocol: Yes   Plan:   Lovenox 95 mg sq q12h  Follow CBC  Qiara Minetti, Toribio Harbour, PharmD 11/28/2014,4:43 PM

## 2014-11-28 NOTE — ED Notes (Signed)
Family concerned that there is a class action lawsuit for Xarelto and is asking if she can take something different. It was advised that the patient and family discuss this concern with admitting MD.

## 2014-11-28 NOTE — ED Notes (Signed)
Pt states had chest pressure this morning with back/neck pain and shortness of breath. Pt states had nausea with pain.

## 2014-11-28 NOTE — Telephone Encounter (Signed)
HARD TIME BREATHING - CALL TO TRIAGE.  Pressure between rib cage " like between by breast "  SOB  Cold sweats intermittantly " like hot flashes "  Above started this am-   Per further inquiry pt denies any left arm pain or pain radiating up to jaw.  Per discussion- symptoms started this am then subsided- " but now I am noticing it coming back "  Pt denies any dizziness.  Per RN discussion she states " I do not think it is my heart- just more not breathing good "  Plan - pt scheduled for symptom management.  This RN advised pt if symptoms worsen on way in to this office she is to proceed to the ER for urgent care. Kim verbalized understanding.  Lab appt and symptom management appointment made.

## 2014-11-29 ENCOUNTER — Encounter (HOSPITAL_COMMUNITY): Payer: Self-pay | Admitting: *Deleted

## 2014-11-29 ENCOUNTER — Encounter: Payer: Self-pay | Admitting: Nurse Practitioner

## 2014-11-29 DIAGNOSIS — D6481 Anemia due to antineoplastic chemotherapy: Secondary | ICD-10-CM

## 2014-11-29 DIAGNOSIS — D509 Iron deficiency anemia, unspecified: Secondary | ICD-10-CM

## 2014-11-29 DIAGNOSIS — C189 Malignant neoplasm of colon, unspecified: Secondary | ICD-10-CM

## 2014-11-29 DIAGNOSIS — I2699 Other pulmonary embolism without acute cor pulmonale: Principal | ICD-10-CM

## 2014-11-29 LAB — BASIC METABOLIC PANEL
Anion gap: 7 (ref 5–15)
BUN: 16 mg/dL (ref 6–23)
CALCIUM: 8.4 mg/dL (ref 8.4–10.5)
CO2: 25 mmol/L (ref 19–32)
CREATININE: 0.84 mg/dL (ref 0.50–1.10)
Chloride: 104 mmol/L (ref 96–112)
GFR calc Af Amer: >90 mL/min (ref >90)
GFR, EST NON AFRICAN AMERICAN: 78 mL/min — AB (ref >90)
Glucose, Bld: 107 mg/dL — ABNORMAL HIGH (ref 70–99)
Potassium: 3.9 mmol/L (ref 3.5–5.1)
Sodium: 136 mmol/L (ref 135–145)

## 2014-11-29 LAB — URINALYSIS, ROUTINE W REFLEX MICROSCOPIC
Glucose, UA: NEGATIVE mg/dL
Ketones, ur: 15 mg/dL — AB
Nitrite: POSITIVE — AB
PH: 7 (ref 5.0–8.0)
Protein, ur: 100 mg/dL — AB
Specific Gravity, Urine: 1.02 (ref 1.005–1.030)
Urobilinogen, UA: 1 mg/dL (ref 0.0–1.0)

## 2014-11-29 LAB — CBC
HCT: 29.3 % — ABNORMAL LOW (ref 36.0–46.0)
Hemoglobin: 8.7 g/dL — ABNORMAL LOW (ref 12.0–15.0)
MCH: 21.8 pg — AB (ref 26.0–34.0)
MCHC: 29.7 g/dL — ABNORMAL LOW (ref 30.0–36.0)
MCV: 73.4 fL — ABNORMAL LOW (ref 78.0–100.0)
Platelets: 169 10*3/uL (ref 150–400)
RBC: 3.99 MIL/uL (ref 3.87–5.11)
RDW: 20.5 % — AB (ref 11.5–15.5)
WBC: 5 10*3/uL (ref 4.0–10.5)

## 2014-11-29 LAB — MRSA PCR SCREENING: MRSA BY PCR: NEGATIVE

## 2014-11-29 LAB — URINE MICROSCOPIC-ADD ON

## 2014-11-29 MED ORDER — ENOXAPARIN (LOVENOX) PATIENT EDUCATION KIT
PACK | Freq: Once | Status: AC
  Administered 2014-11-29: 10:00:00
  Filled 2014-11-29: qty 1

## 2014-11-29 MED ORDER — ENSURE COMPLETE PO LIQD
237.0000 mL | ORAL | Status: DC
  Administered 2014-11-29: 237 mL via ORAL

## 2014-11-29 MED ORDER — SODIUM CHLORIDE 0.9 % IJ SOLN
10.0000 mL | INTRAMUSCULAR | Status: DC | PRN
  Administered 2014-11-29 – 2014-11-30 (×3): 10 mL
  Filled 2014-11-29 (×2): qty 40

## 2014-11-29 NOTE — Progress Notes (Signed)
INITIAL NUTRITION ASSESSMENT  DOCUMENTATION CODES Per approved criteria  -Obesity Unspecified   INTERVENTION: - Ensure Complete po once daily, each supplement provides 350 kcal and 13 grams of protein  NUTRITION DIAGNOSIS: Inadequate oral intake related to cancer as evidenced by wt loss.   Goal: Pt to meet >/= 90% of their estimated nutrition needs   Monitor:  Weight trend, po intake, acceptance of supplements, labs  Reason for Assessment: MST  54 y.o. female  Admitting Dx: <principal problem not specified>  ASSESSMENT: 54 y.o. female diagnosed with colon cancer. Patient is status post right hemicolectomy with colostomy. Currently undergoing FOLFOX chemotherapy. The plan is to initiate radiation therapy in the next several months.  - Pt reports that she lost ~ 30 lbs due to cancer treatment since December. Her appetite is since improving.  - Pt drinks Ensure at home sometimes.  - Will order nutritional supplements once daily. - No fat or muscle wasting.   Labs Reviewed  Height: Ht Readings from Last 1 Encounters:  11/28/14 5\' 8"  (1.727 m)    Weight: Wt Readings from Last 1 Encounters:  11/28/14 206 lb 8 oz (93.668 kg)    Ideal Body Weight: 63.9 kg  % Ideal Body Weight: 147%  Wt Readings from Last 10 Encounters:  11/28/14 206 lb 8 oz (93.668 kg)  11/23/14 211 lb 9.6 oz (95.981 kg)  11/07/14 213 lb 3.2 oz (96.707 kg)  10/02/14 227 lb (102.967 kg)   BMI:  There is no weight on file to calculate BMI.  Estimated Nutritional Needs: Kcal: 1900-2100 Protein: 120-130 g Fluid: 2.1 L/day  Skin: intact  Diet Order: Diet regular  EDUCATION NEEDS: -Education needs addressed   Intake/Output Summary (Last 24 hours) at 11/29/14 1422 Last data filed at 11/29/14 0647  Gross per 24 hour  Intake     10 ml  Output      0 ml  Net     10 ml    Last BM: prior to admission   Labs:   Recent Labs Lab 11/28/14 1213 11/29/14 0645  NA 138 136  K 4.1 3.9  CL   --  104  CO2 20* 25  BUN 12.3 16  CREATININE 0.8 0.84  CALCIUM 9.0 8.4  GLUCOSE 101 107*    CBG (last 3)  No results for input(s): GLUCAP in the last 72 hours.  Scheduled Meds: . citalopram  40 mg Oral Daily  . enoxaparin (LOVENOX) injection  95 mg Subcutaneous Q12H  . enoxaparin   Does not apply Once  . pantoprazole  40 mg Oral Daily    Continuous Infusions:   Past Medical History  Diagnosis Date  . GERD (gastroesophageal reflux disease)   . Depression   . Colon cancer 10/10/14    colon adenocarcinoma  . Anxiety     Past Surgical History  Procedure Laterality Date  . Cystoscopy with retrograde pyelogram, ureteroscopy and stent placement Right 10/05/2014    Procedure: [place], URETEROSCOPY  AND STENT PLACEMENT;  Surgeon: Alexis Frock, MD;  Location: [facility];  Service: Urology;  Laterality: Right;  . Laparotomy N/A 10/10/2014    Procedure: EXPLORATORY LAPAROTOMY;  Surgeon: Doreen Salvage, MD;  Location: [place];  Service: General;  Laterality: N/A;  . Partial colectomy  10/10/2014    Procedure: PARTIAL SIGMOID COLECTOMY;  Surgeon: Doreen Salvage, MD;  Location: [place];  Service: General;;  . Colostomy  10/10/2014    Procedure: COLOSTOMY;  Surgeon: Doreen Salvage, MD;  Location: [facility]  OR;  Service: General;;  . Colostomy revision  10/10/2014    Procedure: PARTIAL RIGHT COLECTOMY;  Surgeon: Doreen Salvage, MD;  Location: [place];  Service: General;;  . Ureteral reimplantion Right 10/10/2014    Procedure: OPEN RIGHT URETERAL LYSIS ;  Surgeon: Alexis Frock, MD;  Location: [place];  Service: Urology;  Laterality: Right;  . Portacath placement N/A 10/17/2014    Procedure: INSERTION PORT-A-CATH LEFT SUBCLAVIAN AND MIDLINE INCISION STAPLE REMOVAL ;  Surgeon: Coralie Keens, MD;  Location: [place];  Service: General;  Laterality: N/A;    Laurette Schimke MS, RD, LDN

## 2014-11-29 NOTE — Assessment & Plan Note (Signed)
Patient initiated cycle 1 of her FOLFOX chemotherapy regimen on 11/23/2014.  She is scheduled to return for cycle 2 of the same regimen on 12/07/2014.

## 2014-11-29 NOTE — Progress Notes (Signed)
VASCULAR LAB PRELIMINARY  PRELIMINARY  PRELIMINARY  PRELIMINARY  Bilateral lower extremity venous duplex completed.    Preliminary report:  Right:  No evidence of DVT, superficial thrombosis, or Baker's cyst. Left: small non occlusive non mobile DVT noted in the Popliteal vein.  No evidence of superficial thrombosis.  No Baker's cyst.  Bernita Beckstrom, RVS 11/29/2014, 3:36 PM

## 2014-11-29 NOTE — Progress Notes (Signed)
ANTICOAGULATION CONSULT NOTE -   Pharmacy Consult for Lovenox Indication: Suspected Pulmonary Embolism  Allergies  Allergen Reactions  . Acyclovir And Related   . Amoxicillin     Yeast infection    Patient Measurements: Height : 68 inches Weight : 93.7 kg  Vital Signs: Temp: 97.8 F (36.6 C) (01/27 0623) Temp Source: Oral (01/27 0623) BP: 115/59 mmHg (01/27 0623) Pulse Rate: 80 (01/27 0623)  Labs:  Recent Labs  11/28/14 1212 11/28/14 1213 11/28/14 1434 11/28/14 1522 11/29/14 0645  HGB 10.0*  --   --   --  8.7*  HCT 34.2*  --   --   --  29.3*  PLT 251  --   --   --  169  APTT  --   --   --  31  --   LABPROT  --   --   --  13.9  --   INR  --   --   --  1.06  --   CREATININE  --  0.8  --   --  0.84  TROPONINI  --   --  <0.03  --   --     Estimated Creatinine Clearance: 92.7 mL/min (by C-G formula based on Cr of 0.84).   Medical History: Past Medical History  Diagnosis Date  . GERD (gastroesophageal reflux disease)   . Depression   . Colon cancer 10/10/14    colon adenocarcinoma  . Anxiety     Medications:  Scheduled:  . citalopram  40 mg Oral Daily  . enoxaparin (LOVENOX) injection  95 mg Subcutaneous Q12H  . enoxaparin   Does not apply Once  . pantoprazole  40 mg Oral Daily   Infusions:     Assessment:  54 yr female with colon cancer, undergoing chemotherapy  With complaint of chest pressure and shortness of breath  CT Angio pending  Pharmacy consulted to dose Lovenox for suspected Pulmonary Embolism  Scr 0.84, CrCl~8mls/min  H/H low  Goal of Therapy:   Monitor platelets by anticoagulation protocol: Yes   Plan:   Continue Lovenox 95 mg sq q12h  Follow CBC  Dolly Rias RPh 11/29/2014, 11:42 AM Pager [phone]

## 2014-11-29 NOTE — Progress Notes (Signed)
[name] Progress Note   Allison Mitchell JSH:[id number] DOB: 22-Apr-1961 DOA: 11/28/2014 PCP: [name], MD  Brief narrative: Allison Mitchell is a 54 y.o. female female who was recently diagnosed with invasive moderate differentiated adenocarcinoma (per colonoscopy in 09/2014 where three different masses noted: terminal ileum, 1.5 cm mass in the ascending colon, additional 1.5 cm mass in the mid sigmoid colon). She was also recently hospitalized 10/2014 for persistent abd pain, had SBO and obstructed distal right ureter causing moderate to severe right hydroureteronephrosis, now status post right hemicolectomy with primary anastomosis with sigmoid colectomy with end colostomy, ureteral stent placement (done Friday, Oct 07, 2014).   She presented to [facility] ED with complaints of shortness of breath on exertion and tightness in the center of her chest and is found to have pulmonary emboli.   Subjective: Feels that the chest tightness has improved significantly but she can still feel it when she takes a deep breath. She has been ambulating to the bathroom without any significant shortness of breath.  Assessment/Plan: Principal Problem:   Pulmonary emboli -Continue Lovenox-hematology oncology consult requested as decide on ultimate treatment plan -Patient is hesitant to be on Lovenox at home but per my discussion with heme/Onc, the patient will need Lovenox due to the extent of her PE. -And with case management to see if she can afford the Lovenox at home-will ask staff to start teaching  Active Problems:   Colorectal cancer, stage III -Status post colectomy and colostomy as mentioned above -Plans for chemotherapy to be initiated per Onc    Code Status: Full code Family Communication:  Disposition Plan: Home when stable DVT prophylaxis: Lovenox  Consultants: Oncology   Antibiotics: Anti-infectives    None         Objective: There were no vitals filed for this  visit.  Intake/Output Summary (Last 24 hours) at 11/29/14 1509 Last data filed at 11/29/14 0647  Gross per 24 hour  Intake     10 ml  Output      0 ml  Net     10 ml     Vitals Filed Vitals:   11/28/14 1702 11/28/14 1800 11/28/14 2143 11/29/14 0623  BP: 139/66 105/76 130/54 115/59  Pulse: 86 86 87 80  Temp:  98.4 F (36.9 C) 98.2 F (36.8 C) 97.8 F (36.6 C)  TempSrc:  Oral Oral Oral  Resp: 18 20 20 20   SpO2: 100% 99% 98% 97%    Exam: General: Alert oriented 3, No acute respiratory distress Lungs: Clear to auscultation bilaterally without wheezes or crackles Cardiovascular: Regular rate and rhythm without murmur gallop or rub normal S1 and S2 Abdomen: Nontender, nondistended, soft, bowel sounds positive, no rebound, no ascites, no appreciable mass Extremities: No significant cyanosis, clubbing, or edema bilateral lower extremities  Data Reviewed: Basic Metabolic Panel:  Recent Labs Lab 11/28/14 1213 11/29/14 0645  NA 138 136  K 4.1 3.9  CL  --  104  CO2 20* 25  GLUCOSE 101 107*  BUN 12.3 16  CREATININE 0.8 0.84  CALCIUM 9.0 8.4   Liver Function Tests:  Recent Labs Lab 11/28/14 1213  AST 34  ALT 48  ALKPHOS 120  BILITOT 0.70  PROT 7.2  ALBUMIN 3.9   No results for input(s): LIPASE, AMYLASE in the last 168 hours. No results for input(s): AMMONIA in the last 168 hours. CBC:  Recent Labs Lab 11/23/14 1042 11/28/14 1212 11/29/14 0645  WBC 5.5 7.3 5.0  NEUTROABS 3.2 4.5  --  HGB 8.5* 10.0* 8.7*  HCT 29.0* 34.2* 29.3*  MCV 73.2* 70.5* 73.4*  PLT 242 251 169   Cardiac Enzymes:  Recent Labs Lab 11/28/14 1434  TROPONINI <0.03   BNP (last 3 results) No results for input(s): PROBNP in the last 8760 hours. CBG: No results for input(s): GLUCAP in the last 168 hours.  Recent Results (from the past 240 hour(s))  MRSA PCR Screening     Status: None   Collection Time: 11/29/14 11:16 AM  Result Value Ref Range Status   MRSA by PCR NEGATIVE  NEGATIVE Final    Comment:        The GeneXpert MRSA Assay (FDA approved for NASAL specimens only), is one component of a comprehensive MRSA colonization surveillance program. It is not intended to diagnose MRSA infection nor to guide or monitor treatment for MRSA infections.      Studies:  Recent x-ray studies have been reviewed in detail by the Attending Physician  Scheduled Meds:  Scheduled Meds: . citalopram  40 mg Oral Daily  . enoxaparin (LOVENOX) injection  95 mg Subcutaneous Q12H  . enoxaparin   Does not apply Once  . feeding supplement (ENSURE COMPLETE)  237 mL Oral Q24H  . pantoprazole  40 mg Oral Daily   Continuous Infusions:   Time spent on care of this patient: 35 minutes   [place], MD 11/29/2014, 3:09 PM  LOS: 1 day   [name] Office  [phone] Pager - Text Page per [email or web address]  If 7PM-7AM, please contact night-coverage [email or web address]

## 2014-11-29 NOTE — Progress Notes (Signed)
SYMPTOM MANAGEMENT CLINIC   HPI: Allison Mitchell 54 y.o. female diagnosed with colon cancer.  Patient is status post right hemicolectomy with colostomy.  Currently undergoing FOLFOX chemotherapy.  The plan is to initiate radiation therapy in the next several months.  Patient called the cancer Center today requesting urgent care visit.  She reports new onset last night of some substernal chest pain that was radiating to her left neck and down her back.  She was also experiencing intermittent episodes of dyspnea.  She also complained of some vague discomfort with inspiration.  She denies any recent URI symptoms.  She denies any recent fevers or chills.  Also, patient underwent a right hemicolectomy with ostomy placement on 10/10/2014.  Approximately 3 weeks post surgery-surgical wound was opened and drained.  Patient continues with a wet-to-dry dressing to surgical site.  Both patient and her husband state the surgical site wound is healing very well; and is much less deep than it was previously.   HPI  CURRENT THERAPY: Upcoming Treatment Dates - COLORECTAL FOLFOX q14d Days with orders from any treatment category:  12/07/2014      SCHEDULING COMMUNICATION      ondansetron (ZOFRAN) IVPB 8 mg      dexamethasone (DECADRON) injection 10 mg      oxaliplatin (ELOXATIN) 185 mg in dextrose 5 % 500 mL chemo infusion      leucovorin 860 mg in dextrose 5 % 250 mL infusion      fluorouracil (ADRUCIL) chemo injection 850 mg      fluorouracil (ADRUCIL) 5,150 mg in sodium chloride 0.9 % 150 mL chemo infusion      sodium chloride 0.9 % injection 10 mL      heparin lock flush 100 unit/mL      heparin lock flush 100 unit/mL      alteplase (CATHFLO ACTIVASE) injection 2 mg      sodium chloride 0.9 % injection 3 mL      Cold Pack 1 packet      Hot Pack 1 packet      dextrose 5 % solution      TREATMENT CONDITIONS 12/09/2014      SCHEDULING COMMUNICATION      sodium chloride 0.9 % injection 10 mL  heparin lock flush 100 unit/mL      heparin lock flush 100 unit/mL      alteplase (CATHFLO ACTIVASE) injection 2 mg      sodium chloride 0.9 % injection 3 mL      Cold Pack 1 packet 12/21/2014      SCHEDULING COMMUNICATION      ondansetron (ZOFRAN) IVPB 8 mg      dexamethasone (DECADRON) injection 10 mg      oxaliplatin (ELOXATIN) 185 mg in dextrose 5 % 500 mL chemo infusion      leucovorin 860 mg in dextrose 5 % 250 mL infusion      fluorouracil (ADRUCIL) chemo injection 850 mg      fluorouracil (ADRUCIL) 5,150 mg in sodium chloride 0.9 % 150 mL chemo infusion      sodium chloride 0.9 % injection 10 mL      heparin lock flush 100 unit/mL      heparin lock flush 100 unit/mL      alteplase (CATHFLO ACTIVASE) injection 2 mg      sodium chloride 0.9 % injection 3 mL      Cold Pack 1 packet      Hot Pack 1 packet  dextrose 5 % solution      TREATMENT CONDITIONS    ROS  Past Medical History  Diagnosis Date  . GERD (gastroesophageal reflux disease)   . Depression   . Colon cancer 10/10/14    colon adenocarcinoma  . Anxiety     Past Surgical History  Procedure Laterality Date  . Cystoscopy with retrograde pyelogram, ureteroscopy and stent placement Right 10/05/2014    Procedure: [place], URETEROSCOPY  AND STENT PLACEMENT;  Surgeon: Alexis Frock, MD;  Location: [facility];  Service: Urology;  Laterality: Right;  . Laparotomy N/A 10/10/2014    Procedure: EXPLORATORY LAPAROTOMY;  Surgeon: Doreen Salvage, MD;  Location: [place];  Service: General;  Laterality: N/A;  . Partial colectomy  10/10/2014    Procedure: PARTIAL SIGMOID COLECTOMY;  Surgeon: Doreen Salvage, MD;  Location: [place];  Service: General;;  . Colostomy  10/10/2014    Procedure: COLOSTOMY;  Surgeon: Doreen Salvage, MD;  Location: [place];  Service: General;;  . Colostomy revision  10/10/2014    Procedure: PARTIAL RIGHT COLECTOMY;  Surgeon: Doreen Salvage, MD;  Location: [place];  Service: General;;  . Ureteral  reimplantion Right 10/10/2014    Procedure: OPEN RIGHT URETERAL LYSIS ;  Surgeon: Alexis Frock, MD;  Location: [place];  Service: Urology;  Laterality: Right;  . Portacath placement N/A 10/17/2014    Procedure: INSERTION PORT-A-CATH LEFT SUBCLAVIAN AND MIDLINE INCISION STAPLE REMOVAL ;  Surgeon: Coralie Keens, MD;  Location: [place];  Service: General;  Laterality: N/A;    has Partial bowel obstruction; Bowel obstruction; Colorectal cancer, stage III; Anemia; Chest pain; and Pulmonary emboli on her problem list.    is allergic to acyclovir and related and amoxicillin.    Medication List       This list is accurate as of: 11/28/14  2:05 PM.  Always use your most recent med list.               ciprofloxacin 250 MG tablet  Commonly known as:  CIPRO  Take 1 tablet (250 mg total) by mouth 2 (two) times daily.     citalopram 20 MG tablet  Commonly known as:  CELEXA  Take 40 mg by mouth daily.     HYDROcodone-acetaminophen 5-325 MG per tablet  Commonly known as:  NORCO/VICODIN  Take 1 tablet by mouth every 6 (six) hours as needed for moderate pain.     lidocaine-prilocaine cream  Commonly known as:  EMLA  Apply 1 application topically as needed.     magnesium hydroxide 400 MG/5ML suspension  Commonly known as:  MILK OF MAGNESIA  Take 5 mLs by mouth daily as needed for mild constipation.     omeprazole 40 MG capsule  Commonly known as:  PRILOSEC  Take 40 mg by mouth every morning.     ondansetron 8 MG tablet  Commonly known as:  ZOFRAN  Take 1 tablet (8 mg total) by mouth every 8 (eight) hours as needed for nausea or vomiting.     phenazopyridine 200 MG tablet  Commonly known as:  PYRIDIUM  Take 200 mg by mouth 2 (two) times daily as needed (uti).     PRESCRIPTION MEDICATION  Chemo at [facility]     psyllium 58.6 % powder  Commonly known as:  METAMUCIL  Take 1 packet by mouth as needed (constipation).     traMADol 50 MG tablet  Commonly known as:  ULTRAM  Take 1 tablet  (50 mg total) by mouth every  6 (six) hours as needed.     zolpidem 5 MG tablet  Commonly known as:  AMBIEN  Take 1 tablet (5 mg total) by mouth at bedtime as needed for sleep.         PHYSICAL EXAMINATION  Blood pressure 126/47, pulse 60, temperature 98 F (36.7 C), temperature source Oral, resp. rate 20, height 5' 8"  (1.727 m), weight 206 lb 8 oz (93.668 kg).  Physical Exam  Constitutional: She is oriented to person, place, and time. Vital signs are normal. She appears unhealthy.  HENT:  Head: Normocephalic and atraumatic.  Mouth/Throat: Oropharynx is clear and moist.  Eyes: Conjunctivae and EOM are normal. Pupils are equal, round, and reactive to light. Right eye exhibits no discharge. Left eye exhibits no discharge. No scleral icterus.  Neck: Normal range of motion. Neck supple. No JVD present. No tracheal deviation present. No thyromegaly present.  Cardiovascular: Normal rate, regular rhythm, normal heart sounds and intact distal pulses.   Pulmonary/Chest: Effort normal and breath sounds normal. No respiratory distress. She has no wheezes. She has no rales. She exhibits no tenderness.  Abdominal: Soft. Bowel sounds are normal. She exhibits no distension and no mass. There is no tenderness. There is no rebound and no guarding.  Musculoskeletal: Normal range of motion. She exhibits no edema or tenderness.  Lymphadenopathy:    She has no cervical adenopathy.  Neurological: She is alert and oriented to person, place, and time. Gait normal.  Skin: Skin is warm and dry. No rash noted. No erythema. There is pallor.  Central, lateral abdominal incision partially healed; with opened surgical site to lower portion of incision.  Patient has a wet-to-dry dressing intact.  No evidence of infection.  Psychiatric: Affect normal.  Nursing note and vitals reviewed.   LABORATORY DATA:. Admission on 11/28/2014  Component Date Value Ref Range Status  . Troponin I 11/28/2014 <0.03  <0.031 ng/mL  Final   Comment:        NO INDICATION OF MYOCARDIAL INJURY.   Marland Kitchen aPTT 11/28/2014 31  24 - 37 seconds Final  . Prothrombin Time 11/28/2014 13.9  11.6 - 15.2 seconds Final  . INR 11/28/2014 1.06  0.00 - 1.49 Final  . Sodium 11/29/2014 136  135 - 145 mmol/L Final  . Potassium 11/29/2014 3.9  3.5 - 5.1 mmol/L Final  . Chloride 11/29/2014 104  96 - 112 mmol/L Final  . CO2 11/29/2014 25  19 - 32 mmol/L Final  . Glucose, Bld 11/29/2014 107* 70 - 99 mg/dL Final  . BUN 11/29/2014 16  6 - 23 mg/dL Final  . Creatinine, Ser 11/29/2014 0.84  0.50 - 1.10 mg/dL Final  . Calcium 11/29/2014 8.4  8.4 - 10.5 mg/dL Final  . GFR calc non Af Amer 11/29/2014 78* >90 mL/min Final  . GFR calc Af Amer 11/29/2014 >90  >90 mL/min Final   Comment: (NOTE) The eGFR has been calculated using the CKD EPI equation. This calculation has not been validated in all clinical situations. eGFR's persistently <90 mL/min signify possible Chronic Kidney Disease.   . Anion gap 11/29/2014 7  5 - 15 Final  . WBC 11/29/2014 5.0  4.0 - 10.5 K/uL Final  . RBC 11/29/2014 3.99  3.87 - 5.11 MIL/uL Final  . Hemoglobin 11/29/2014 8.7* 12.0 - 15.0 g/dL Final  . HCT 11/29/2014 29.3* 36.0 - 46.0 % Final  . MCV 11/29/2014 73.4* 78.0 - 100.0 fL Final  . MCH 11/29/2014 21.8* 26.0 - 34.0 pg Final  .  MCHC 11/29/2014 29.7* 30.0 - 36.0 g/dL Final  . RDW 11/29/2014 20.5* 11.5 - 15.5 % Final  . Platelets 11/29/2014 169  150 - 400 K/uL Final   REPEATED TO VERIFY  . MRSA by PCR 11/29/2014 NEGATIVE  NEGATIVE Final   Comment:        The GeneXpert MRSA Assay (FDA approved for NASAL specimens only), is one component of a comprehensive MRSA colonization surveillance program. It is not intended to diagnose MRSA infection nor to guide or monitor treatment for MRSA infections.   Appointment on 11/28/2014  Component Date Value Ref Range Status  . WBC 11/28/2014 7.3  3.9 - 10.3 10e3/uL Final  . NEUT# 11/28/2014 4.5  1.5 - 6.5 10e3/uL Final  .  HGB 11/28/2014 10.0* 11.6 - 15.9 g/dL Final  . HCT 11/28/2014 34.2* 34.8 - 46.6 % Final  . Platelets 11/28/2014 251  145 - 400 10e3/uL Final  . MCV 11/28/2014 70.5* 79.5 - 101.0 fL Final  . MCH 11/28/2014 20.7* 25.1 - 34.0 pg Final  . MCHC 11/28/2014 29.3* 31.5 - 36.0 g/dL Final  . RBC 11/28/2014 4.86  3.70 - 5.45 10e6/uL Final  . RDW 11/28/2014 22.6* 11.2 - 14.5 % Final  . lymph# 11/28/2014 2.3  0.9 - 3.3 10e3/uL Final  . MONO# 11/28/2014 0.3  0.1 - 0.9 10e3/uL Final  . Eosinophils Absolute 11/28/2014 0.1  0.0 - 0.5 10e3/uL Final  . Basophils Absolute 11/28/2014 0.1  0.0 - 0.1 10e3/uL Final  . NEUT% 11/28/2014 61.2  38.4 - 76.8 % Final  . LYMPH% 11/28/2014 32.2  14.0 - 49.7 % Final  . MONO% 11/28/2014 3.7  0.0 - 14.0 % Final  . EOS% 11/28/2014 1.5  0.0 - 7.0 % Final  . BASO% 11/28/2014 1.4  0.0 - 2.0 % Final  . Sodium 11/28/2014 138  136 - 145 mEq/L Final  . Potassium 11/28/2014 4.1  3.5 - 5.1 mEq/L Final  . Chloride 11/28/2014 105  98 - 109 mEq/L Final  . CO2 11/28/2014 20* 22 - 29 mEq/L Final  . Glucose 11/28/2014 101  70 - 140 mg/dl Final  . BUN 11/28/2014 12.3  7.0 - 26.0 mg/dL Final  . Creatinine 11/28/2014 0.8  0.6 - 1.1 mg/dL Final  . Total Bilirubin 11/28/2014 0.70  0.20 - 1.20 mg/dL Final  . Alkaline Phosphatase 11/28/2014 120  40 - 150 U/L Final  . AST 11/28/2014 34  5 - 34 U/L Final  . ALT 11/28/2014 48  0 - 55 U/L Final  . Total Protein 11/28/2014 7.2  6.4 - 8.3 g/dL Final  . Albumin 11/28/2014 3.9  3.5 - 5.0 g/dL Final  . Calcium 11/28/2014 9.0  8.4 - 10.4 mg/dL Final  . Anion Gap 11/28/2014 13* 3 - 11 mEq/L Final  . EGFR 11/28/2014 79* >90 ml/min/1.73 m2 Final   eGFR is calculated using the CKD-EPI Creatinine Equation (2009)  . Hold Tube, Blood Bank 11/28/2014 Blood Bank Order Cancelled   Final   EKG:  LEONA, ALEN NU:[id number] 28-Nov-2014 15:30:57 [facility] ED ROUTINE RECORD Sinus rhythm Abnormal R-wave progression, early transition Since  last tracing rate slower Confirmed by KNAPP MD-J, JON ([id number]) on 11/28/2014 3:40:42 PM 30m/s 112mmV 150Hz  8.0 SP2 CID: [id number] by: Confirmed By: JODorie RankD-J Vent. rate 83 BPM PR interval 158 ms QRS duration 73 ms QT/QTc 379/445 ms P-R-T axes 37 39 62 [id number] yr) Female Caucasian Room:WLEX5 Loc RADIOGRAPHIC STUDIES: Dg Chest 2 View  11/28/2014  CLINICAL DATA:  Shortness of breath after chemotherapy last Thursday  EXAM: CHEST  2 VIEW  COMPARISON:  10/17/2014  FINDINGS: Left-sided Port-A-Cath in satisfactory position. There is no focal parenchymal opacity, pleural effusion, or pneumothorax. The heart and mediastinal contours are unremarkable.  The osseous structures are unremarkable.  IMPRESSION: No active cardiopulmonary disease.   Electronically Signed   By: Kathreen Devoid   On: 11/28/2014 15:34   Ct Angio Chest Pe W/cm &/or Wo Cm  11/28/2014   CLINICAL DATA:  Chest pain since this morning  EXAM: CT ANGIOGRAPHY CHEST WITH CONTRAST  TECHNIQUE: Multidetector CT imaging of the chest was performed using the standard protocol during bolus administration of intravenous contrast. Multiplanar CT image reconstructions and MIPs were obtained to evaluate the vascular anatomy.  CONTRAST:  147m OMNIPAQUE IOHEXOL 350 MG/ML SOLN  COMPARISON:  None.  FINDINGS: The study is positive for acute pulmonary thromboembolism. There is a small amount of thrombus in the peripheral main right pulmonary artery extending into a segmental branch of the right upper lobe on image 117 of series 10, superior segment of the right lower lobe on image 134, and in basal segments on image 171 through 179. No evidence of left-sided acute pulmonary thromboembolism. Right ventricle to left ventricle ratio is 0.85. There is no evidence of right heart strain.  No evidence of aortic dissection or aneurysm. Great vessels are patent.  Left subclavian vein Port-A-Cath is present. Tip is at the cavoatrial junction.  No evidence  of abnormal mediastinal adenopathy.  No pneumothorax.  No pleural effusion.  Small benign appearing pleural plaques over the right hemidiaphragm. No pulmonary parenchymal nodules.  No acute bony deformity.  There is an enhancing structure within the hilum of the spleen. Splenic artery aneurysm is not excluded. It is 12 mm in diameter.  Review of the MIP images confirms the above findings.  IMPRESSION: The study is positive for pulmonary thromboembolism with and primarily segmental branches of the right lung. Critical Value/emergent results were called by telephone at the time of interpretation on 11/28/2014 at 4:40 pm to Dr. JDorie Rank, who verbally acknowledged these results.  Possible small splenic artery aneurysm. CTA of the abdomen can be performed to further characterize.   Electronically Signed   By: AMaryclare BeanM.D.   On: 11/28/2014 16:40    ASSESSMENT/PLAN:    Chest pain Patient complaint of new onset chest pressure with intermittent episodes of dyspnea since last night.  Patient denies any actual chest pain.  Patient states that the pain radiates from her central, substernal chest up to her left neck and down her back on occasion.  Vital signs stable at time of exam.  Patient in no acute respiratory distress.  Labs all within normal limits.  EKG obtained today revealed a sinus tachycardia with a rate of 102; and a QTC of 443.  Patient will be transferred to the emergency department for further evaluation of chest pain; and to possibly rule out pulmonary embolism.  [facility] prior to patient being transferred to the emergency department [place].   Colorectal cancer, stage III Patient initiated cycle 1 of her FOLFOX chemotherapy regimen on 11/23/2014.  She is scheduled to return for cycle 2 of the same regimen on 12/07/2014.   Patient stated understanding of all instructions; and was in agreement  with this plan of care. The patient knows to call the clinic with any  problems, questions or concerns.   Review/collaboration with Dr. Burr Medico regarding all aspects of patient's visit today.   Total time spent with patient was 40 minutes;  with greater than 75 percent of that time spent in face to face counseling regarding her symptoms, and coordination of care and follow up.  Disclaimer: This note was dictated with voice recognition software. Similar sounding words can inadvertently be transcribed and may not be corrected upon review.   Drue Second, NP 11/29/2014

## 2014-11-29 NOTE — Care Management Note (Addendum)
    Page 1 of 2   11/30/2014     3:25:10 PM CARE MANAGEMENT NOTE 11/30/2014  Patient:  Allison Mitchell, Allison Mitchell   Account Number:  [id number]  Date Initiated:  11/29/2014  Documentation initiated by:  Sunday Spillers  Subjective/Objective Assessment:   54 yo female admitted with PE, PMH colon cancer. PTA lived at home with spouse.     Action/Plan:   Home when stable   Anticipated DC Date:  11/30/2014   Anticipated DC Plan:  [place]  CM consult      [facility] Choice  Resumption Of Svcs/PTA Provider   Choice offered to / List presented to:  C-1 Patient        [place] arranged  HH-1 RN  [place]   Status of service:  Completed, signed off Medicare Important Message given?   (If response is "NO", the following Medicare IM given date fields will be blank) Date Medicare IM given:   Medicare IM given by:   Date Additional Medicare IM given:   Additional Medicare IM given by:    Discharge Disposition:  [place]  Per UR Regulation:  Reviewed for med. necessity/level of care/duration of stay  If discussed at [place] of Stay Meetings, dates discussed:    Comments:  11/30/14 Dessa Phi RN BSN NCM  [phone] Faxed d/c summary w/confirmation to [facility] care fax#[phone].   11-30-14 Sunday Spillers RN CM 1100 Spoke with patient at bedside, updated her on the cost of Lovenox, $85/10 syringes not $85/mo. Patient states she will be able afford meds. Requested script for faxing to her pharmacy.  11-29-14 Sunday Spillers RN CM 1400 Spoke with patient at bedside. Reviewed with patient cost of Lovenox (about$85/mo). Patient feels this is affordable. Awaiting final dose and duration from Hematologist. Per request arranged [facility] RN for f/u with Lovenox teaching, patient was active with [facility]. Contacted [facility] and they will follow for Lovenox, orders to resume [facility] RN faxed to  them at [phone].

## 2014-11-29 NOTE — Progress Notes (Signed)
Allison Mitchell   DOB:05/24/1961   ZO#:[id number]   [id number]#:[id number]  Subjective: Pt was admitted yesterday for new-onset PE. She still has some mild mid chest pain/tightness. No significant dyspnea or hypoxia. No fever or productive cough.   Objective:  Filed Vitals:   11/29/14 0623  BP: 115/59  Pulse: 80  Temp: 97.8 F (36.6 C)  Resp: 20    There is no weight on file to calculate BMI.  Intake/Output Summary (Last 24 hours) at 11/29/14 1808 Last data filed at 11/29/14 0647  Gross per 24 hour  Intake     10 ml  Output      0 ml  Net     10 ml     Sclerae unicteric  Oropharynx clear  No peripheral adenopathy  Lungs clear -- no rales or rhonchi  Heart regular rate and rhythm  Abdomen benign  MSK no focal spinal tenderness, no peripheral edema  Neuro nonfocal   CBG (last 3)  No results for input(s): GLUCAP in the last 72 hours.   Labs:  Lab Results  Component Value Date   WBC 5.0 11/29/2014   HGB 8.7* 11/29/2014   HCT 29.3* 11/29/2014   MCV 73.4* 11/29/2014   PLT 169 11/29/2014   NEUTROABS 4.5 11/28/2014    @LASTCHEMISTRY @  Urine Studies No results for input(s): UHGB, CRYS in the last 72 hours.  Invalid input(s): UACOL, UAPR, USPG, UPH, UTP, UGL, UKET, UBIL, UNIT, UROB, ULEU, UEPI, UWBC, URBC, UBAC, CAST, UCOM, BILUA  Basic Metabolic Panel:  Recent Labs Lab 11/28/14 1213 11/29/14 0645  NA 138 136  K 4.1 3.9  CL  --  104  CO2 20* 25  GLUCOSE 101 107*  BUN 12.3 16  CREATININE 0.8 0.84  CALCIUM 9.0 8.4   GFR Estimated Creatinine Clearance: 92.7 mL/min (by C-G formula based on Cr of 0.84). Liver Function Tests:  Recent Labs Lab 11/28/14 1213  AST 34  ALT 48  ALKPHOS 120  BILITOT 0.70  PROT 7.2  ALBUMIN 3.9   No results for input(s): LIPASE, AMYLASE in the last 168 hours. No results for input(s): AMMONIA in the last 168 hours. Coagulation profile  Recent Labs Lab 11/28/14 1522  INR 1.06    CBC:  Recent Labs Lab 11/23/14 1042  11/28/14 1212 11/29/14 0645  WBC 5.5 7.3 5.0  NEUTROABS 3.2 4.5  --   HGB 8.5* 10.0* 8.7*  HCT 29.0* 34.2* 29.3*  MCV 73.2* 70.5* 73.4*  PLT 242 251 169   Cardiac Enzymes:  Recent Labs Lab 11/28/14 1434  TROPONINI <0.03   BNP: Invalid input(s): POCBNP CBG: No results for input(s): GLUCAP in the last 168 hours. D-Dimer No results for input(s): DDIMER in the last 72 hours. Hgb A1c No results for input(s): HGBA1C in the last 72 hours. Lipid Profile No results for input(s): CHOL, HDL, LDLCALC, TRIG, CHOLHDL, LDLDIRECT in the last 72 hours. Thyroid function studies No results for input(s): TSH, T4TOTAL, T3FREE, THYROIDAB in the last 72 hours.  Invalid input(s): FREET3 Anemia work up No results for input(s): VITAMINB12, FOLATE, FERRITIN, TIBC, IRON, RETICCTPCT in the last 72 hours. Microbiology Recent Results (from the past 240 hour(s))  MRSA PCR Screening     Status: None   Collection Time: 11/29/14 11:16 AM  Result Value Ref Range Status   MRSA by PCR NEGATIVE NEGATIVE Final    Comment:        The GeneXpert MRSA Assay (FDA approved for NASAL specimens only), is one component of a  comprehensive MRSA colonization surveillance program. It is not intended to diagnose MRSA infection nor to guide or monitor treatment for MRSA infections.       Studies:  Dg Chest 2 View  11/28/2014   CLINICAL DATA:  Shortness of breath after chemotherapy last Thursday  EXAM: CHEST  2 VIEW  COMPARISON:  10/17/2014  FINDINGS: Left-sided Port-A-Cath in satisfactory position. There is no focal parenchymal opacity, pleural effusion, or pneumothorax. The heart and mediastinal contours are unremarkable.  The osseous structures are unremarkable.  IMPRESSION: No active cardiopulmonary disease.   Electronically Signed   By: Kathreen Devoid   On: 11/28/2014 15:34   Ct Angio Chest Pe W/cm &/or Wo Cm  11/28/2014   CLINICAL DATA:  Chest pain since this morning  EXAM: CT ANGIOGRAPHY CHEST WITH CONTRAST   TECHNIQUE: Multidetector CT imaging of the chest was performed using the standard protocol during bolus administration of intravenous contrast. Multiplanar CT image reconstructions and MIPs were obtained to evaluate the vascular anatomy.  CONTRAST:  148mL OMNIPAQUE IOHEXOL 350 MG/ML SOLN  COMPARISON:  None.  FINDINGS: The study is positive for acute pulmonary thromboembolism. There is a small amount of thrombus in the peripheral main right pulmonary artery extending into a segmental branch of the right upper lobe on image 117 of series 10, superior segment of the right lower lobe on image 134, and in basal segments on image 171 through 179. No evidence of left-sided acute pulmonary thromboembolism. Right ventricle to left ventricle ratio is 0.85. There is no evidence of right heart strain.  No evidence of aortic dissection or aneurysm. Great vessels are patent.  Left subclavian vein Port-A-Cath is present. Tip is at the cavoatrial junction.  No evidence of abnormal mediastinal adenopathy.  No pneumothorax.  No pleural effusion.  Small benign appearing pleural plaques over the right hemidiaphragm. No pulmonary parenchymal nodules.  No acute bony deformity.  There is an enhancing structure within the hilum of the spleen. Splenic artery aneurysm is not excluded. It is 12 mm in diameter.  Review of the MIP images confirms the above findings.  IMPRESSION: The study is positive for pulmonary thromboembolism with and primarily segmental branches of the right lung. Critical Value/emergent results were called by telephone at the time of interpretation on 11/28/2014 at 4:40 pm to Dr. Dorie Rank , who verbally acknowledged these results.  Possible small splenic artery aneurysm. CTA of the abdomen can be performed to further characterize.   Electronically Signed   By: Maryclare Bean M.D.   On: 11/28/2014 16:40    Assessment and plan: 54 y.o. with stage III B colon cancer, on adjuvant chemotherapy FOLFOX.  1. Pulmonary  embolism -This could potentially related to her recent chemotherapy and malignancy. -I discussed the option of anti-coagulation. I recommend Lovenox 1 mg/kg every 12 hours for at least one months, then switched to Lovenox once daily or Coumadin or Xarelto, giving her recent GI surgery, and as thrombus in her peripheral right main pulmonary artery. She is not comfortable to do the Inc. injection by herself, but likely her husband can do the injection for her at home. -We discussed the potential risks of bleeding when she is on Lovenox and chemotherapy -If no sign of hypoxia on ambulatory, and symptoms are stable, she can be discharged home from hem/onc standpoint -I'll see her next week in my clinic before her second cycle of chemotherapy  2. Stage III colon cancer -We'll continue adjuvant chemotherapy as outpatient  3. Anemia, secondary to  iron deficiency and chemotherapy -Transfuse if hemoglobin less than 8    Truitt Merle, MD 11/29/2014  6:08 PM

## 2014-11-29 NOTE — Assessment & Plan Note (Signed)
Patient complaint of new onset chest pressure with intermittent episodes of dyspnea since last night.  Patient denies any actual chest pain.  Patient states that the pain radiates from her central, substernal chest up to her left neck and down her back on occasion.  Vital signs stable at time of exam.  Patient in no acute respiratory distress.  Labs all within normal limits.  EKG obtained today revealed a sinus tachycardia with a rate of 102; and a QTC of 443.  Patient will be transferred to the emergency department for further evaluation of chest pain; and to possibly rule out pulmonary embolism.  Brief history and report was called to [facility], emergency department charge nurse prior to patient being transferred to the emergency department per [place] via wheelchair.

## 2014-11-30 DIAGNOSIS — N390 Urinary tract infection, site not specified: Secondary | ICD-10-CM

## 2014-11-30 DIAGNOSIS — C19 Malignant neoplasm of rectosigmoid junction: Secondary | ICD-10-CM

## 2014-11-30 DIAGNOSIS — R319 Hematuria, unspecified: Secondary | ICD-10-CM

## 2014-11-30 MED ORDER — LEVOFLOXACIN 250 MG PO TABS
250.0000 mg | ORAL_TABLET | Freq: Every day | ORAL | Status: DC
  Filled 2014-11-30: qty 1

## 2014-11-30 MED ORDER — ENOXAPARIN SODIUM 100 MG/ML ~~LOC~~ SOLN: 95.0000 mg | Freq: Two times a day (BID) | SUBCUTANEOUS | Status: DC

## 2014-11-30 MED ORDER — HEPARIN SOD (PORK) LOCK FLUSH 100 UNIT/ML IV SOLN: 500.0000 [IU] | INTRAVENOUS | Status: DC | PRN

## 2014-11-30 MED ORDER — SULFAMETHOXAZOLE-TRIMETHOPRIM 800-160 MG PO TABS
1.0000 | ORAL_TABLET | Freq: Two times a day (BID) | ORAL | Status: DC
  Administered 2014-11-30: 1 via ORAL
  Filled 2014-11-30 (×2): qty 1

## 2014-11-30 MED ORDER — SULFAMETHOXAZOLE-TRIMETHOPRIM 800-160 MG PO TABS: 1.0000 | ORAL_TABLET | Freq: Two times a day (BID) | ORAL | Status: DC

## 2014-11-30 NOTE — Discharge Summary (Addendum)
Physician Discharge Summary  Allison Mitchell BPZ:[id number] DOB: 10-May-1961 DOA: 11/28/2014  PCP: [name], MD  Admit date: 11/28/2014 Discharge date: 11/30/2014  Time spent: 45 minutes   Discharge Condition: stable Diet recommendation: heart heatlhy  Discharge Diagnoses:  Principal Problem:   Pulmonary emboli Active Problems:   Colorectal cancer, stage III UTI  History of present illness:  Pt is very pleasant 54 yo female who was recently diagnosed with invasive moderate differentiated adenocarcinoma (per colonoscopy in 09/2014 where three different masses noted: terminal ileum, 1.5 cm mass in the ascending colon, additional 1.5 cm mass in the mid sigmoid colon). She was also recently hospitalized 10/2014 for persistent abd pain, had SBO and obstructed distal right ureter causing moderate to severe right hydroureteronephrosis, now status post right hemicolectomy with primary anastomosis with sigmoid colectomy with end colostomy, ureteral stent placement (done Friday, Oct 07, 2014). She presented to [facility] ED with main concern of sudden onset of exertional dyspnea which progressed to dyspnea at rest, associated with substernal chest discomfort, intermittent in nature and 5/10 in severity when present with no radiating symptoms, no specific alleviating of aggravating factors, no similar events in the past. Pt denies fevers, chills, abd or urinary concerns. She reports no problems with colostomy, continue wound care and dressing changes twice daily.   In ED, pt is hemodynamically stable, VSS with low grade fever 43F, CXR with no cardiopulmonary etiologies. CT angio chest with new pulmonary embolism.  Hospital Course:  Principal Problem:  Pulmonary emboli -Continue Lovenox-hematology oncology consult requested as decide on ultimate treatment plan -Patient is hesitant to be on Lovenox at home but per my discussion with heme/Onc, she states that the patient will need Lovenox due to the extent of  her PE. -Dr Burr Medico has spoken with the patient and has planned for her to be on Lovenox for 1 month after which she will transition her to an oral anticoagulation.  Active Problems: UTI with hematuria with h/o ureteric stent - positive UA with symptoms of dysuria- she recent finished a 3 day course of Cipro -  I have started her on Bactrim (7 day course as this is a complicated UTI) and will follow up on the results of the culture that has been sent today   Colorectal cancer, stage III -Status post colectomy and colostomy as mentioned above -Plans for chemotherapy to be initiated per Onc   Procedures:  none  Consultations:  Hematology/ oncology  Discharge Exam: Filed Weights   11/29/14 2010  Weight: 93.6 kg (206 lb 5.6 oz)   Filed Vitals:   11/30/14 0537  BP: 138/72  Pulse: 74  Temp: 98.3 F (36.8 C)  Resp: 18    General: AAO x 3, no distress Cardiovascular: RRR, no murmurs  Respiratory: clear to auscultation bilaterally GI: soft, non-tender, non-distended, bowel sound positive  Discharge Instructions You were cared for by a hospitalist during your hospital stay. If you have any questions about your discharge medications or the care you received while you were in the hospital after you are discharged, you can call the unit and asked to speak with the hospitalist on call if the hospitalist that took care of you is not available. Once you are discharged, your primary care physician will handle any further medical issues. Please note that NO REFILLS for any discharge medications will be authorized once you are discharged, as it is imperative that you return to your primary care physician (or establish a relationship with a primary care physician if you  do not have one) for your aftercare needs so that they can reassess your need for medications and monitor your lab values.  Discharge Instructions    Diet - low sodium heart healthy    Complete by:  As directed      Diet - low  sodium heart healthy    Complete by:  As directed      Increase activity slowly    Complete by:  As directed      Increase activity slowly    Complete by:  As directed             Medication List    STOP taking these medications        ciprofloxacin 250 MG tablet  Commonly known as:  CIPRO     PRESCRIPTION MEDICATION      TAKE these medications        citalopram 20 MG tablet  Commonly known as:  CELEXA  Take 40 mg by mouth daily.     enoxaparin 100 MG/ML injection  Commonly known as:  LOVENOX  Inject 0.95 mLs (95 mg total) into the skin every 12 (twelve) hours.     HYDROcodone-acetaminophen 5-325 MG per tablet  Commonly known as:  NORCO/VICODIN  Take 1 tablet by mouth every 6 (six) hours as needed for moderate pain.     lidocaine-prilocaine cream  Commonly known as:  EMLA  Apply 1 application topically as needed.     magnesium hydroxide 400 MG/5ML suspension  Commonly known as:  MILK OF MAGNESIA  Take 5 mLs by mouth daily as needed for mild constipation.     omeprazole 40 MG capsule  Commonly known as:  PRILOSEC  Take 40 mg by mouth every morning.     ondansetron 8 MG tablet  Commonly known as:  ZOFRAN  Take 1 tablet (8 mg total) by mouth every 8 (eight) hours as needed for nausea or vomiting.     phenazopyridine 200 MG tablet  Commonly known as:  PYRIDIUM  Take 200 mg by mouth 2 (two) times daily as needed (uti).     psyllium 58.6 % powder  Commonly known as:  METAMUCIL  Take 1 packet by mouth as needed (constipation).     sulfamethoxazole-trimethoprim 800-160 MG per tablet  Commonly known as:  BACTRIM DS,SEPTRA DS  Take 1 tablet by mouth every 12 (twelve) hours.     traMADol 50 MG tablet  Commonly known as:  ULTRAM  Take 1 tablet (50 mg total) by mouth every 6 (six) hours as needed.     zolpidem 5 MG tablet  Commonly known as:  AMBIEN  Take 1 tablet (5 mg total) by mouth at bedtime as needed for sleep.       Allergies  Allergen Reactions  .  Acyclovir And Related   . Amoxicillin     Yeast infection      The results of significant diagnostics from this hospitalization (including imaging, microbiology, ancillary and laboratory) are listed below for reference.    Significant Diagnostic Studies: Dg Chest 2 View  11/28/2014   CLINICAL DATA:  Shortness of breath after chemotherapy last Thursday  EXAM: CHEST  2 VIEW  COMPARISON:  10/17/2014  FINDINGS: Left-sided Port-A-Cath in satisfactory position. There is no focal parenchymal opacity, pleural effusion, or pneumothorax. The heart and mediastinal contours are unremarkable.  The osseous structures are unremarkable.  IMPRESSION: No active cardiopulmonary disease.   Electronically Signed   By: Kathreen Devoid   On: 11/28/2014  15:34   Ct Angio Chest Pe W/cm &/or Wo Cm  11/28/2014   CLINICAL DATA:  Chest pain since this morning  EXAM: CT ANGIOGRAPHY CHEST WITH CONTRAST  TECHNIQUE: Multidetector CT imaging of the chest was performed using the standard protocol during bolus administration of intravenous contrast. Multiplanar CT image reconstructions and MIPs were obtained to evaluate the vascular anatomy.  CONTRAST:  156mL OMNIPAQUE IOHEXOL 350 MG/ML SOLN  COMPARISON:  None.  FINDINGS: The study is positive for acute pulmonary thromboembolism. There is a small amount of thrombus in the peripheral main right pulmonary artery extending into a segmental branch of the right upper lobe on image 117 of series 10, superior segment of the right lower lobe on image 134, and in basal segments on image 171 through 179. No evidence of left-sided acute pulmonary thromboembolism. Right ventricle to left ventricle ratio is 0.85. There is no evidence of right heart strain.  No evidence of aortic dissection or aneurysm. Great vessels are patent.  Left subclavian vein Port-A-Cath is present. Tip is at the cavoatrial junction.  No evidence of abnormal mediastinal adenopathy.  No pneumothorax.  No pleural effusion.  Small  benign appearing pleural plaques over the right hemidiaphragm. No pulmonary parenchymal nodules.  No acute bony deformity.  There is an enhancing structure within the hilum of the spleen. Splenic artery aneurysm is not excluded. It is 12 mm in diameter.  Review of the MIP images confirms the above findings.  IMPRESSION: The study is positive for pulmonary thromboembolism with and primarily segmental branches of the right lung. Critical Value/emergent results were called by telephone at the time of interpretation on 11/28/2014 at 4:40 pm to Dr. Dorie Rank , who verbally acknowledged these results.  Possible small splenic artery aneurysm. CTA of the abdomen can be performed to further characterize.   Electronically Signed   By: Maryclare Bean M.D.   On: 11/28/2014 16:40   Ct Abdomen Pelvis W Contrast  11/14/2014   CLINICAL DATA:  Followup colon carcinoma. Status post surgical resection and colostomy. Beginning chemotherapy.  EXAM: CT ABDOMEN AND PELVIS WITH CONTRAST  TECHNIQUE: Multidetector CT imaging of the abdomen and pelvis was performed using the standard protocol following bolus administration of intravenous contrast.  CONTRAST:  167mL OMNIPAQUE IOHEXOL 300 MG/ML  SOLN  COMPARISON:  10/02/2014  FINDINGS: Lower Chest:  Unremarkable.  Hepatobiliary: No masses or other significant abnormality identified.  Pancreas: No mass, inflammatory changes, or other significant abnormality identified.  Spleen:  Within normal limits in size and appearance.  Adrenal Glands:  No mass identified.  Kidneys/Urinary Tract: No masses identified. Right ureteral stent is now seen in place with decreased hydronephrosis compared to previous study.  Stomach/Bowel/Peritoneum: Patient has undergone resection of previously seen right lower quadrant mass with left lower quadrant colostomy. No residual mass or bowel wall thickening visualized.  Vascular/Lymphatic: No pathologically enlarged lymph nodes identified. No other significant abnormality  visualized.  Reproductive: No masses identified. A new complex fluid collection is seen in the right pelvis between the uterus and right ureter which measures approximately 2.9 x 5.9 cm on image 67/series 2. Postop abscess cannot be excluded.  Other:  None.  Musculoskeletal:  No suspicious bone lesions identified.  IMPRESSION: New complex fluid collection in the right pelvis measuring 2.9 x 5.9 cm. Postop abscess cannot be excluded.  Decrease right hydronephrosis with ureteral stent in appropriate position.  No evidence of abdominal or pelvic metastatic disease.   Electronically Signed   By: Earle Gell  M.D.   On: 11/14/2014 09:00    Microbiology: Recent Results (from the past 240 hour(s))  MRSA PCR Screening     Status: None   Collection Time: 11/29/14 11:16 AM  Result Value Ref Range Status   MRSA by PCR NEGATIVE NEGATIVE Final    Comment:        The GeneXpert MRSA Assay (FDA approved for NASAL specimens only), is one component of a comprehensive MRSA colonization surveillance program. It is not intended to diagnose MRSA infection nor to guide or monitor treatment for MRSA infections.      Labs: Basic Metabolic Panel:  Recent Labs Lab 11/28/14 1213 11/29/14 0645  NA 138 136  K 4.1 3.9  CL  --  104  CO2 20* 25  GLUCOSE 101 107*  BUN 12.3 16  CREATININE 0.8 0.84  CALCIUM 9.0 8.4   Liver Function Tests:  Recent Labs Lab 11/28/14 1213  AST 34  ALT 48  ALKPHOS 120  BILITOT 0.70  PROT 7.2  ALBUMIN 3.9   No results for input(s): LIPASE, AMYLASE in the last 168 hours. No results for input(s): AMMONIA in the last 168 hours. CBC:  Recent Labs Lab 11/28/14 1212 11/29/14 0645  WBC 7.3 5.0  NEUTROABS 4.5  --   HGB 10.0* 8.7*  HCT 34.2* 29.3*  MCV 70.5* 73.4*  PLT 251 169   Cardiac Enzymes:  Recent Labs Lab 11/28/14 1434  TROPONINI <0.03   BNP: BNP (last 3 results) No results for input(s): PROBNP in the last 8760 hours. CBG: No results for input(s):  GLUCAP in the last 168 hours.     SignedDebbe Odea, MD [name] 11/30/2014, 2:17 PM

## 2014-11-30 NOTE — Progress Notes (Signed)
ANTICOAGULATION CONSULT NOTE -  Pharmacy Consult for Lovenox Indication: Acute Pulmonary Embolism  Allergies  Allergen Reactions  . Acyclovir And Related   . Amoxicillin     Yeast infection    Patient Measurements: Height : 68 inches Weight : 93.6 kg  Vital Signs: Temp: 98.3 F (36.8 C) (01/28 0537) Temp Source: Oral (01/28 0537) BP: 138/72 mmHg (01/28 0537) Pulse Rate: 74 (01/28 0537)  Labs:  Recent Labs  11/28/14 1212 11/28/14 1213 11/28/14 1434 11/28/14 1522 11/29/14 0645  HGB 10.0*  --   --   --  8.7*  HCT 34.2*  --   --   --  29.3*  PLT 251  --   --   --  169  APTT  --   --   --  31  --   LABPROT  --   --   --  13.9  --   INR  --   --   --  1.06  --   CREATININE  --  0.8  --   --  0.84  TROPONINI  --   --  <0.03  --   --     Estimated Creatinine Clearance: 92.7 mL/min (by C-G formula based on Cr of 0.84).   Medical History: Past Medical History  Diagnosis Date  . GERD (gastroesophageal reflux disease)   . Depression   . Colon cancer 10/10/14    colon adenocarcinoma  . Anxiety     Medications:  Scheduled:  . citalopram  40 mg Oral Daily  . enoxaparin (LOVENOX) injection  95 mg Subcutaneous Q12H  . feeding supplement (ENSURE COMPLETE)  237 mL Oral Q24H  . pantoprazole  40 mg Oral Daily  . sulfamethoxazole-trimethoprim  1 tablet Oral Q12H   Infusions:     Assessment: 54 yr female with colon cancer, undergoing chemotherapy, presented with chest pressure and shortness of breath. CT angiography demonstrated acute pulmonary embolism.  Pharmacy was consulted to dose Lovenox.  Goal of Therapy:  Full-dose anticoagulation with Lovenox Monitor platelets by anticoagulation protocol: Yes   Today, 11/30/2014 D#2 Lovenox 95 mg (1mg /kg) SQ q12h Hgb low in setting of stage III colon cancer being treated with adjuvant chemotherapy (FOLFOX) Pltc WNL No overt bleeding reported  SCr WNL, stable.  Estimated CrCl well above 30 mL/min  Recommendations from  hematology/oncology 1/27 noted (continue Lovenox 1mg /kg SQ q12h for at least one month before considering other options for continuation [Lovenox 1.5mg /kg SQ once daily, Coumadin, or Xarelto])   Plan:   Continue Lovenox 95 mg sq q12h  Follow H/H, pltc   Clayburn Pert, PharmD, BCPS Pager: [phone] 11/30/2014  12:11 PM

## 2014-12-01 LAB — URINE CULTURE
CULTURE: NO GROWTH
Colony Count: NO GROWTH
Special Requests: NORMAL

## 2014-12-07 ENCOUNTER — Other Ambulatory Visit (HOSPITAL_BASED_OUTPATIENT_CLINIC_OR_DEPARTMENT_OTHER): Payer: BLUE CROSS/BLUE SHIELD

## 2014-12-07 ENCOUNTER — Ambulatory Visit (HOSPITAL_BASED_OUTPATIENT_CLINIC_OR_DEPARTMENT_OTHER): Payer: BLUE CROSS/BLUE SHIELD | Admitting: Physician Assistant

## 2014-12-07 ENCOUNTER — Ambulatory Visit (HOSPITAL_BASED_OUTPATIENT_CLINIC_OR_DEPARTMENT_OTHER): Payer: BLUE CROSS/BLUE SHIELD

## 2014-12-07 ENCOUNTER — Ambulatory Visit: Payer: BLUE CROSS/BLUE SHIELD

## 2014-12-07 ENCOUNTER — Encounter: Payer: Self-pay | Admitting: Physician Assistant

## 2014-12-07 ENCOUNTER — Inpatient Hospital Stay: Payer: BLUE CROSS/BLUE SHIELD

## 2014-12-07 VITALS — BP 152/68 | HR 91 | Temp 98.0°F | Resp 18 | Ht 68.0 in | Wt 208.3 lb

## 2014-12-07 DIAGNOSIS — N9489 Other specified conditions associated with female genital organs and menstrual cycle: Secondary | ICD-10-CM

## 2014-12-07 DIAGNOSIS — D509 Iron deficiency anemia, unspecified: Secondary | ICD-10-CM

## 2014-12-07 DIAGNOSIS — C19 Malignant neoplasm of rectosigmoid junction: Secondary | ICD-10-CM | POA: Diagnosis not present

## 2014-12-07 DIAGNOSIS — C187 Malignant neoplasm of sigmoid colon: Secondary | ICD-10-CM

## 2014-12-07 DIAGNOSIS — C182 Malignant neoplasm of ascending colon: Secondary | ICD-10-CM

## 2014-12-07 DIAGNOSIS — C180 Malignant neoplasm of cecum: Secondary | ICD-10-CM

## 2014-12-07 DIAGNOSIS — R300 Dysuria: Secondary | ICD-10-CM | POA: Diagnosis not present

## 2014-12-07 DIAGNOSIS — Z95828 Presence of other vascular implants and grafts: Secondary | ICD-10-CM

## 2014-12-07 DIAGNOSIS — Z5111 Encounter for antineoplastic chemotherapy: Secondary | ICD-10-CM | POA: Diagnosis not present

## 2014-12-07 DIAGNOSIS — C189 Malignant neoplasm of colon, unspecified: Secondary | ICD-10-CM

## 2014-12-07 LAB — COMPREHENSIVE METABOLIC PANEL (CC13)
ALT: 129 U/L — ABNORMAL HIGH (ref 0–55)
AST: 56 U/L — AB (ref 5–34)
Albumin: 3.3 g/dL — ABNORMAL LOW (ref 3.5–5.0)
Alkaline Phosphatase: 139 U/L (ref 40–150)
Anion Gap: 8 mEq/L (ref 3–11)
BUN: 12.6 mg/dL (ref 7.0–26.0)
CALCIUM: 8.7 mg/dL (ref 8.4–10.4)
CHLORIDE: 109 meq/L (ref 98–109)
CO2: 24 mEq/L (ref 22–29)
CREATININE: 0.9 mg/dL (ref 0.6–1.1)
EGFR: 78 mL/min/{1.73_m2} — ABNORMAL LOW (ref >90)
GLUCOSE: 115 mg/dL (ref 70–140)
Potassium: 4.4 mEq/L (ref 3.5–5.1)
Sodium: 142 mEq/L (ref 136–145)
Total Bilirubin: 0.28 mg/dL (ref 0.20–1.20)
Total Protein: 6.2 g/dL — ABNORMAL LOW (ref 6.4–8.3)

## 2014-12-07 LAB — CBC WITH DIFFERENTIAL/PLATELET
BASO%: 1 % (ref 0.0–2.0)
Basophils Absolute: 0 10*3/uL (ref 0.0–0.1)
EOS%: 3.6 % (ref 0.0–7.0)
Eosinophils Absolute: 0.1 10*3/uL (ref 0.0–0.5)
HCT: 29.4 % — ABNORMAL LOW (ref 34.8–46.6)
HEMOGLOBIN: 8.7 g/dL — AB (ref 11.6–15.9)
LYMPH%: 43.5 % (ref 14.0–49.7)
MCH: 21.4 pg — ABNORMAL LOW (ref 25.1–34.0)
MCHC: 29.4 g/dL — ABNORMAL LOW (ref 31.5–36.0)
MCV: 72.5 fL — AB (ref 79.5–101.0)
MONO#: 0.5 10*3/uL (ref 0.1–0.9)
MONO%: 13.6 % (ref 0.0–14.0)
NEUT%: 38.3 % — ABNORMAL LOW (ref 38.4–76.8)
NEUTROS ABS: 1.4 10*3/uL — AB (ref 1.5–6.5)
Platelets: 303 10*3/uL (ref 145–400)
RBC: 4.05 10*6/uL (ref 3.70–5.45)
RDW: 23.2 % — ABNORMAL HIGH (ref 11.2–14.5)
WBC: 3.8 10*3/uL — AB (ref 3.9–10.3)
lymph#: 1.6 10*3/uL (ref 0.9–3.3)

## 2014-12-07 MED ORDER — OXALIPLATIN CHEMO INJECTION 100 MG/20ML
85.0000 mg/m2 | Freq: Once | INTRAVENOUS | Status: AC
  Administered 2014-12-07: 185 mg via INTRAVENOUS
  Filled 2014-12-07: qty 37

## 2014-12-07 MED ORDER — ONDANSETRON 8 MG/NS 50 ML IVPB
INTRAVENOUS | Status: AC
  Filled 2014-12-07: qty 8

## 2014-12-07 MED ORDER — SODIUM CHLORIDE 0.9 % IJ SOLN
10.0000 mL | INTRAMUSCULAR | Status: DC | PRN
  Administered 2014-12-07: 10 mL via INTRAVENOUS
  Filled 2014-12-07: qty 10

## 2014-12-07 MED ORDER — DEXAMETHASONE SODIUM PHOSPHATE 10 MG/ML IJ SOLN
10.0000 mg | Freq: Once | INTRAMUSCULAR | Status: AC
  Administered 2014-12-07: 10 mg via INTRAVENOUS

## 2014-12-07 MED ORDER — HEPARIN SOD (PORK) LOCK FLUSH 100 UNIT/ML IV SOLN
500.0000 [IU] | Freq: Once | INTRAVENOUS | Status: DC | PRN
  Filled 2014-12-07: qty 5

## 2014-12-07 MED ORDER — SODIUM CHLORIDE 0.9 % IV SOLN
2400.0000 mg/m2 | INTRAVENOUS | Status: DC
  Administered 2014-12-07: 5150 mg via INTRAVENOUS
  Filled 2014-12-07: qty 103

## 2014-12-07 MED ORDER — SODIUM CHLORIDE 0.9 % IJ SOLN
10.0000 mL | INTRAMUSCULAR | Status: DC | PRN
  Filled 2014-12-07: qty 10

## 2014-12-07 MED ORDER — ONDANSETRON 8 MG/50ML IVPB (CHCC)
8.0000 mg | Freq: Once | INTRAVENOUS | Status: AC
  Administered 2014-12-07: 8 mg via INTRAVENOUS

## 2014-12-07 MED ORDER — LEUCOVORIN CALCIUM INJECTION 350 MG
400.0000 mg/m2 | Freq: Once | INTRAVENOUS | Status: AC
  Administered 2014-12-07: 860 mg via INTRAVENOUS
  Filled 2014-12-07: qty 43

## 2014-12-07 MED ORDER — DEXAMETHASONE SODIUM PHOSPHATE 10 MG/ML IJ SOLN
INTRAMUSCULAR | Status: AC
  Filled 2014-12-07: qty 1

## 2014-12-07 MED ORDER — FLUOROURACIL CHEMO INJECTION 2.5 GM/50ML
400.0000 mg/m2 | Freq: Once | INTRAVENOUS | Status: AC
  Administered 2014-12-07: 850 mg via INTRAVENOUS
  Filled 2014-12-07: qty 17

## 2014-12-07 MED ORDER — DEXTROSE 5 % IV SOLN
Freq: Once | INTRAVENOUS | Status: AC
  Administered 2014-12-07: 13:00:00 via INTRAVENOUS

## 2014-12-07 NOTE — Progress Notes (Signed)
Per AJ, ok to treat today with 12/07/14 labs

## 2014-12-07 NOTE — Patient Instructions (Signed)
Continue labs and chemotherapy as scheduled Take your nausea medication as prescribed Follow-up in 2 weeks, prior to your next scheduled cycle of chemotherapy

## 2014-12-07 NOTE — Patient Instructions (Signed)

## 2014-12-07 NOTE — Patient Instructions (Signed)

## 2014-12-07 NOTE — Progress Notes (Signed)
[place]  [phone] [phone]  Office Progress Note   Patient Care Team: [name], MD as PCP - General (Family Medicine) 12/07/2014  CHIEF COMPLAINTS/PURPOSE OF CONSULTATION:  Synchronized three colon cancer, stage III    Colorectal cancer, stage III   10/04/2014 Tumor Marker CEA 1.0 .  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 mpT4aN2aM0,stage IIIC.    10/10/2014 Surgery partial right hemicolectomy with removal of terminal ileum, sigmoid colectomy, right ureter lysis   CURRENT THERAPY: FOLFOX every 2 weeks, started on 11/23/2014, plan for 12 cycles.   HISTORY OF PRESENTING ILLNESS:  Allison Mitchell 54 y.o. female is here for follow-up after her hospital discharge. She was recently diagnosed with 3 synchronous to colon cancer, status post surgical resection. Initially seen by Dr. Burr Medico when she was diagnosed in the hospital.  She presented with some GI symptoms since last October 2014 at which time she was diagnosed with ischemic colitis. Due to guaiac-positive stools, patient had undergone an outpatient colonoscopy on 09/26/2014, which showed inflamed and ulcerated mucosa in the terminal ileum, and a 1.5 cm mass in the ascending colon and additional 1.5 cm mass in the mid sigmoid colon. All biopsy from the above 3 sites came back positive for invasive moderate differentiated adenocarcinoma, and the sigmoid mass containing squamous cell differentiation.   She was hospitalized for worsening of her symptoms on 10/03/2014.  A CT of the abdomen and pelvis with contrast on 10/02/2014 was remarkable for soft tissue/bowel wall thickening of the right lower pelvis center at the cecum-terminal ileum involving a portion of the adjacent sigmoid colon. This causes small bowel obstruction  and obstructed the distal right ureter causing moderate to severe right hydroureteronephrosis. She was transferred from [facility] to [facility] for surgical management and further testing. CT of the chest with contrast on 10/03/2014 negative for metastatic disease. MRI of the liver without and without contrast on 10/03/2014 is remarkable for small hepatic cysts, but no findings suspicious for hepatic metastatic disease. Right-sided hydroureteronephrosis was once again seen, with early small bowel obstruction bowel gas pattern.  She underwent right hemicolectomy with primary anastomosis with sigmoid colectomy with end colostomy on Friday, Dec 5, after cystoscopy is performed on 12/4 to rule out GU invasion and a ureteral stent placement.  She has family history of colon cancer in her grandmother and polyps in her sister. Denies risk factors for HIV or hepatitis. Denies Diabetes. No Tobacco. No ETOH. Denies prior radiation. SHe did consume significant amount of red meat until recently. We are asked to see the patient in consultation, with recommendations on the oncology standpoint   INTERIM HISTORY: Allison Mitchell returns for follow up prior to proceeding with her second cycle of chemotherapy. She reports significant nausea and vomiting the day after cycle #1 of her chemotherapy but also states that she did not take her antibiotics on a regular scheduled basis which she feels as part of the problem. She continues to have some urinary burning with some mild bleeding related to a urinary stent. She is to see Dr. Tresa Moore later today regarding this issue. She is otherwise feeling well and is ready to proceed with cycle #2 of her systemic chemotherapy with FOLFOX. a She continues to recover from her surgery. Her energy level is returning to her baseline.  Her abdominal wound  is significantly smaller now, she is changing dressing daily but can now use a Band-Aid in one area. She denies any abdominal pain,  nausea or diarrhea. She was also recently diagnosed with pulmonary embolus and is having some bruising in the abdomen area as a result of the subcutaneous Lovenox injections. Her husband is giving her the injections and believes that his technique is improving with each injection.  MEDICAL HISTORY:  Past Medical History  Diagnosis Date  . GERD (gastroesophageal reflux disease)   . Depression   . Colon cancer 10/10/14    colon adenocarcinoma  . Anxiety     SURGICAL HISTORY: Past Surgical History  Procedure Laterality Date  . Cystoscopy with retrograde pyelogram, ureteroscopy and stent placement Right 10/05/2014    Procedure: [place], URETEROSCOPY  AND STENT PLACEMENT;  Surgeon: Alexis Frock, MD;  Location: [facility];  Service: Urology;  Laterality: Right;  . Laparotomy N/A 10/10/2014    Procedure: EXPLORATORY LAPAROTOMY;  Surgeon: Doreen Salvage, MD;  Location: [place];  Service: General;  Laterality: N/A;  . Partial colectomy  10/10/2014    Procedure: PARTIAL SIGMOID COLECTOMY;  Surgeon: Doreen Salvage, MD;  Location: [place];  Service: General;;  . Colostomy  10/10/2014    Procedure: COLOSTOMY;  Surgeon: Doreen Salvage, MD;  Location: [place];  Service: General;;  . Colostomy revision  10/10/2014    Procedure: PARTIAL RIGHT COLECTOMY;  Surgeon: Doreen Salvage, MD;  Location: [place];  Service: General;;  . Ureteral reimplantion Right 10/10/2014    Procedure: OPEN RIGHT URETERAL LYSIS ;  Surgeon: Alexis Frock, MD;  Location: [place];  Service: Urology;  Laterality: Right;  . Portacath placement N/A 10/17/2014    Procedure: INSERTION PORT-A-CATH LEFT SUBCLAVIAN AND MIDLINE INCISION STAPLE REMOVAL ;  Surgeon: Coralie Keens, MD;  Location: [place];  Service: General;  Laterality: N/A;    SOCIAL HISTORY: History   Social History  . Marital Status: Married    Spouse Name: N/A    Number of Children: N/A  . Years of Education: N/A   Occupational History  . Not on file.   Social History  Main Topics  . Smoking status: Never Smoker   . Smokeless tobacco: Never Used  . Alcohol Use: No  . Drug Use: No  . Sexual Activity: No   Other Topics Concern  . Not on file   Social History Narrative    FAMILY HISTORY: Family History  Problem Relation Age of Onset  . Cancer Mother     skin cancer   . Diabetes Father   . CAD Father     Onset in his 50s, has 7 stents  . Cancer Maternal Grandmother 11    colon cancer   . Cancer Paternal Grandmother 14    breast cancer     ALLERGIES:  is allergic to acyclovir and related and amoxicillin.  MEDICATIONS:  Current Outpatient Prescriptions  Medication Sig Dispense Refill  . citalopram (CELEXA) 20 MG tablet Take 40 mg by mouth daily.  1  . enoxaparin (LOVENOX) 100 MG/ML injection Inject 0.95 mLs (95 mg total) into the skin every 12 (twelve) hours. 60 Syringe 0  . HYDROcodone-acetaminophen (NORCO/VICODIN) 5-325 MG per tablet Take 1 tablet by mouth every 6 (six) hours as needed for moderate pain. 30 tablet 0  . lidocaine-prilocaine (EMLA) cream Apply 1 application topically as needed. 30 g 0  . magnesium hydroxide (MILK OF MAGNESIA) 400 MG/5ML suspension Take 5 mLs by mouth daily as needed for  mild constipation.    Marland Kitchen omeprazole (PRILOSEC) 40 MG capsule Take 40 mg by mouth every morning.  12  . ondansetron (ZOFRAN) 8 MG tablet Take 1 tablet (8 mg total) by mouth every 8 (eight) hours as needed for nausea or vomiting. 20 tablet 0  . phenazopyridine (PYRIDIUM) 200 MG tablet Take 200 mg by mouth 2 (two) times daily as needed (uti).   0  . psyllium (METAMUCIL) 58.6 % powder Take 1 packet by mouth as needed (constipation).    . traMADol (ULTRAM) 50 MG tablet Take 1 tablet (50 mg total) by mouth every 6 (six) hours as needed. 40 tablet 1  . zolpidem (AMBIEN) 5 MG tablet Take 1 tablet (5 mg total) by mouth at bedtime as needed for sleep. 20 tablet 0   No current facility-administered medications for this visit.   Facility-Administered  Medications Ordered in Other Visits  Medication Dose Route Frequency Provider Last Rate Last Dose  . fluorouracil (ADRUCIL) 5,150 mg in sodium chloride 0.9 % 150 mL chemo infusion  2,400 mg/m2 (Treatment Plan Actual) Intravenous 1 day or 1 dose Truitt Merle, MD      . fluorouracil (ADRUCIL) chemo injection 850 mg  400 mg/m2 (Treatment Plan Actual) Intravenous Once Truitt Merle, MD      . heparin lock flush 100 unit/mL  500 Units Intracatheter Once PRN Truitt Merle, MD      . leucovorin 860 mg in dextrose 5 % 250 mL infusion  400 mg/m2 (Treatment Plan Actual) Intravenous Once Truitt Merle, MD 147 mL/hr at 12/07/14 1331 860 mg at 12/07/14 1331  . oxaliplatin (ELOXATIN) 185 mg in dextrose 5 % 500 mL chemo infusion  85 mg/m2 (Treatment Plan Actual) Intravenous Once Truitt Merle, MD 269 mL/hr at 12/07/14 1331 185 mg at 12/07/14 1331  . sodium chloride 0.9 % injection 10 mL  10 mL Intracatheter PRN Truitt Merle, MD        REVIEW OF SYSTEMS:   Constitutional: Denies fevers, chills or abnormal night sweats Eyes: Denies blurriness of vision, double vision or watery eyes Ears, nose, mouth, throat, and face: Denies mucositis or sore throat Respiratory: Denies cough, dyspnea or wheezes Cardiovascular: Denies palpitation, chest discomfort or lower extremity swelling Gastrointestinal:  Denies nausea, heartburn or change in bowel habits Skin: Denies abnormal skin rashes Lymphatics: Denies new lymphadenopathy or easy bruising Neurological:Denies numbness, tingling or new weaknesses Behavioral/Psych: Mood is stable, no new changes  All other systems were reviewed with the patient and are negative.  PHYSICAL EXAMINATION: ECOG PERFORMANCE STATUS: 1 - Symptomatic but completely ambulatory  Filed Vitals:   12/07/14 1014  BP: 152/68  Pulse: 91  Temp: 98 F (36.7 C)  Resp: 18   Filed Weights   12/07/14 1014  Weight: 208 lb 4.8 oz (94.484 kg)    GENERAL:alert, no distress and comfortable SKIN: skin color, texture, turgor  are normal, no rashes or significant lesions. Scattered areas of healing ecchymosis at the site of Lovenox injections across the abdomen EYES: normal, conjunctiva are pink and non-injected, sclera clear OROPHARYNX:no exudate, no erythema and lips, buccal mucosa, and tongue normal  NECK: supple, thyroid normal size, non-tender, without nodularity LYMPH:  no palpable lymphadenopathy in the cervical, axillary or inguinal LUNGS: clear to auscultation and percussion with normal breathing effort HEART: regular rate & rhythm and no murmurs and no lower extremity edema ABDOMEN:abdomen soft, non-tender and normal bowel sounds. Positive for colostomy back on the left, with normal-appearing stool. Surgical wound upper part is well-healed, lower  part is covered by gauze and a Band-Aid at the umbilicus. Musculoskeletal:no cyanosis of digits and no clubbing  PSYCH: alert & oriented x 3 with fluent speech NEURO: no focal motor/sensory deficits  LABORATORY DATA:  I have reviewed the data as listed Lab Results  Component Value Date   WBC 3.8* 12/07/2014   HGB 8.7* 12/07/2014   HCT 29.4* 12/07/2014   MCV 72.5* 12/07/2014   PLT 303 12/07/2014    Recent Labs  10/13/14 0915 10/14/14 0436  11/16/14 0935 11/28/14 1213 11/29/14 0645 12/07/14 0940  NA 140 144  < > 141 138 136 142  K 3.9 3.6*  < > 4.0 4.1 3.9 4.4  CL 104 109  --   --   --  104  --   CO2 25 19  < > 26 20* 25 24  GLUCOSE 109* 88  < > 98 101 107* 115  BUN 9 8  < > 12.7 12.3 16 12.6  CREATININE 0.61 0.60  < > 0.9 0.8 0.84 0.9  CALCIUM 8.1* 8.3*  < > 9.0 9.0 8.4 8.7  GFRNONAA >90 >90  --   --   --  78*  --   GFRAA >90 >90  --   --   --  >90  --   PROT  --   --   --  6.9 7.2  --  6.2*  ALBUMIN  --   --   --  3.6 3.9  --  3.3*  AST  --   --   --  16 34  --  56*  ALT  --   --   --  16 48  --  129*  ALKPHOS  --   --   --  113 120  --  139  BILITOT  --   --   --  0.34 0.70  --  0.28  < > = values in this interval not displayed. Iron and  TIBC [facility]  Status: Finalresult Visible to patient:  Not Released Nextappt: 12/07/2014 at 09:45 AM in Oncology [facility] 1) Dx:  Colorectal cancer, stage III            Ref Range 10:42 AM    Iron 41 - 142 ug/dL 14 (L)   TIBC 236 - 444 ug/dL 365   UIBC 120 - 384 ug/dL 351   %SAT 21 - 57 % 4 (L)      Ferritin  Status: Finalresult Visible to patient:  Not Released Nextappt: 12/07/2014 at 09:45 AM in Oncology [facility] Lab 1) Dx:  Colorectal cancer, stage III         Ref Range 10:42 AM    Ferritin 9 - 269 ng/ml 14       Surgical path 10/10/2014 1. Colon, segmental resection for tumor, Cecum, terminal ileum, sigmoid colon - MULTIFOCAL INVASIVE ADENOCARCINOMA, MODERATELY DIFFERENTIATED, THE LARGEST FOCUS SPANS 4.0 CM. - ADENOCARCINOMA INVOLVES SEROSA. - LYMPHOVASCULAR INVASION IS IDENTIFIED. - METASTATIC CARCINOMA IN 5 OF 17 LYMPH NODES (5/17). - THE PROXIMAL AND DISTAL SURGICAL RESECTION MARGINS ARE NEGATIVE FOR ADENOCARCINOMA. - SEE ONCOLOGY TABLE BELOW. 2. Soft tissue, biopsy, Right periureteral tissue - ADENOCARCINOMA. 3. Soft tissue, biopsy, Right periureteral tissue - INFLAMED AND DEGENERATING FIBROADIPOSE TISSUE. - THERE IS NO EVIDENCE OF MALIGNANCY. Microscopic Comment 1. COLON AND RECTUM (INCLUDING TRANS-ANAL RESECTION): Specimen: Distal ileum, cecum, proximal ascending colon, and adherent sigmoid colon. Procedure: Resection. Tumor site: Multiple foci, including ileocecal valve, ascending colon and sigmoid. Specimen integrity: Disrupted. Macroscopic  intactness of mesorectum: N/A Macroscopic tumor perforation: Present. Invasive tumor: Maximum size: 4.0 cm. Histologic type(s): Adenocarcinoma. Histologic grade and differentiation: G2: moderately differentiated Type of polyp in which invasive carcinoma arose: Tubulovillous adenomas. Microscopic extension of invasive tumor: Focally involves the  serosa. Lymph-Vascular invasion: Present. Peri-neural invasion: Not identified. Tumor deposit(s) (discontinuous extramural extension): Not identified. Resection margins: Proximal margin: 4.0 cm Distal margin: 3.5 cm Circumferential (radial) (posterior ascending, posterior descending; lateral and posterior mid-rectum; and entire lower 1/3 rectum): Cannot be assessed. Mesenteric margin (sigmoid and transverse): Cannot be assessed. Treatment effect (neo-adjuvant therapy): N/A Additional polyp(s): Not identified. Non-neoplastic findings: No significant findings. Lymph nodes: number examined 17; number positive: 5 Pathologic Staging: mpT4a, pN2a, pMX Ancillary studies: A block will be sent for MSI testing by [facility] and MSI testing by PCR and the results reported separately. Comment: There appear to be three synchronous primary tumors in the specimen, one at the ileocecal valve, one in the ascending colon, and one at the sigmoid colon. Each of these foci contain an associated tubulovillous adenoma, suggesting three separate primary sites. The tumor in the sigmoid colon appears to involve the serosa as well. The other two tumors show extension into at least the pericolonic soft tissue. Dr Mali Rund has reviewed selected slides and concurs that there are likely three synchronous tumors here. The case was discussed with Dr Hulen Skains. Per Dr Hulen Skains, the specimen in #2 is a contiguous piece of tissue 2 of 4 Amended copy Amended FINAL for Allison Mitchell, TRAMON[place]NA [phone]) Microscopic Comment(continued) with the main tumor, and therefore, not considered a distant metastatic focus. Therefore, the tumor is staged as an MX. (JBK:ecj 10/16/2014) JOSHUA  Mismatch Repair (MMR) Protein Immunohistochemistry (IHC) IHC Expression Result: MLH1: Preserved nuclear expression (greater 50% tumor expression) MSH2: Preserved nuclear expression (greater 50% tumor expression) MSH6: Preserved nuclear expression (greater 50%  tumor expression) PMS2: Preserved nuclear expression (greater 50% tumor expression) * Internal control demonstrates intact nuclear expression Interpretation: NORMAL There is preserved expression  RADIOGRAPHIC STUDIES: I have personally reviewed the radiological images as listed and agreed with the findings in the report.  CT of abdomen and pelvis 11/14/2014 New complex fluid collection in the right pelvis measuring 2.9 x 5.9cm. Postop abscess cannot be excluded.  Decrease right hydronephrosis with ureteral stent in appropriate position.  No evidence of abdominal or pelvic metastatic disease.   ASSESSMENT & PLAN:  54 year old Caucasian female with minimal past medical history, who was found to have 3 multifocal colon cancer at terminal ileum/cecum, ascending colon and sigmoid colon. All of them has adenocarcinoma,  And the cecum tumor has squamous differentiation. The cecum tumor has directly invaded the soft tissue along the right ureter, which was not completely resected (1% tumor left over, per Dr. Hulen Skains). She has mpT4aN2aM0 stage IIIB, MSI/MMR normal.   1. Multifocal stage IIIB colon cancer -I discussed her surgical past findings extensively with patient and her husband. Unfortunately, the tumor was not completely resected. She has very small amount of tumor left over. -Given her residual tumor after surgery, advanced stage IIIc disease, high risk recurrence in the future, Dr. Burr Medico strongly recommended adjuvant chemotherapy with the standard regimen FOLFOX. Side effects were discussed with patient in great detail. She agrees to proceed. She is status post 1 cycle. -Dr. Burr Medico had a discussion with Dr. Lisbeth Renshaw regarding the sequence of chemotherapy and irradiation. There was an agreement to start chemotherapy for a few months first, then start radiation with or without sensitizing chemotherapy 5-FU, , then finish  the rest of the adjuvant chemotherapy for total of 6 months.   2. Right  pelvic cyst -Per Dr. Ernestina Penna review this is likely a seroma secondary to surgery. This will continue to be closely monitored by her surgeon Dr. Hulen Skains.  3. Dysuria -She had recent urine culture which was negative. However she has persistent dysuria. -She completed a three-day course of Cipro 250 mg twice daily. She continues to have dysuria with some intermittent hematuria. She will follow-up with her urologist Dr. Tresa Moore as scheduled.  She had a right ureter stent placement during her colon cancer surgery.  4. Open surgical wound -No signs of infection. Continue dressing change and follow-up with Dr. Hulen Skains.  5. Iron deficient anemia -She has microcytic anemia. Her iron studies showed iron 14, saturation 4% and ferritin 14, this is consistent with iron deficient anemia -This is secondary to the bleeding from her colon cancer. -Given her recent GI surgery, Dr. Burr Medico plans to give her IV Feraheme 560m twice in the next few weeks. - Plan, -Patient reviewed with Dr. FBurr Medico She will proceed with cycle 2 of her adjuvant chemotherapy will FOLFOX. We will continue to monitor her counts closely. Her ANC is 1.4 today. We will consider adding Neulasta if she has significant neutropenia with subsequent cycles. She'll follow-up with Dr. FBurr Medicoas previously scheduled for cycle #3 on 12/21/2014 with repeat labs.    All questions were answered. The patient knows to call the clinic with any problems, questions or concerns.      JWynetta Emery Allison Mayfield E, PA-C 12/07/2014 1:40 PM

## 2014-12-08 LAB — CEA: CEA: 0.7 ng/mL (ref 0.0–5.0)

## 2014-12-09 ENCOUNTER — Ambulatory Visit (HOSPITAL_BASED_OUTPATIENT_CLINIC_OR_DEPARTMENT_OTHER): Payer: BLUE CROSS/BLUE SHIELD

## 2014-12-09 DIAGNOSIS — Z452 Encounter for adjustment and management of vascular access device: Secondary | ICD-10-CM

## 2014-12-09 DIAGNOSIS — C19 Malignant neoplasm of rectosigmoid junction: Secondary | ICD-10-CM | POA: Diagnosis not present

## 2014-12-09 MED ORDER — HEPARIN SOD (PORK) LOCK FLUSH 100 UNIT/ML IV SOLN
500.0000 [IU] | Freq: Once | INTRAVENOUS | Status: AC | PRN
  Administered 2014-12-09: 500 [IU]
  Filled 2014-12-09: qty 5

## 2014-12-09 MED ORDER — SODIUM CHLORIDE 0.9 % IJ SOLN
10.0000 mL | INTRAMUSCULAR | Status: DC | PRN
  Administered 2014-12-09: 10 mL
  Filled 2014-12-09: qty 10

## 2014-12-12 ENCOUNTER — Other Ambulatory Visit: Payer: Self-pay | Admitting: Urology

## 2014-12-21 ENCOUNTER — Other Ambulatory Visit (HOSPITAL_BASED_OUTPATIENT_CLINIC_OR_DEPARTMENT_OTHER): Payer: BLUE CROSS/BLUE SHIELD

## 2014-12-21 ENCOUNTER — Ambulatory Visit (HOSPITAL_BASED_OUTPATIENT_CLINIC_OR_DEPARTMENT_OTHER): Payer: BLUE CROSS/BLUE SHIELD

## 2014-12-21 ENCOUNTER — Ambulatory Visit (HOSPITAL_BASED_OUTPATIENT_CLINIC_OR_DEPARTMENT_OTHER): Payer: BLUE CROSS/BLUE SHIELD | Admitting: Hematology

## 2014-12-21 VITALS — BP 134/63 | HR 96 | Temp 98.2°F | Wt 210.5 lb

## 2014-12-21 DIAGNOSIS — I2699 Other pulmonary embolism without acute cor pulmonale: Secondary | ICD-10-CM

## 2014-12-21 DIAGNOSIS — D509 Iron deficiency anemia, unspecified: Secondary | ICD-10-CM

## 2014-12-21 DIAGNOSIS — C182 Malignant neoplasm of ascending colon: Secondary | ICD-10-CM | POA: Diagnosis not present

## 2014-12-21 DIAGNOSIS — Z95828 Presence of other vascular implants and grafts: Secondary | ICD-10-CM

## 2014-12-21 DIAGNOSIS — Z452 Encounter for adjustment and management of vascular access device: Secondary | ICD-10-CM

## 2014-12-21 DIAGNOSIS — C180 Malignant neoplasm of cecum: Secondary | ICD-10-CM

## 2014-12-21 DIAGNOSIS — C187 Malignant neoplasm of sigmoid colon: Secondary | ICD-10-CM

## 2014-12-21 DIAGNOSIS — C19 Malignant neoplasm of rectosigmoid junction: Secondary | ICD-10-CM

## 2014-12-21 DIAGNOSIS — D500 Iron deficiency anemia secondary to blood loss (chronic): Secondary | ICD-10-CM

## 2014-12-21 DIAGNOSIS — Z5111 Encounter for antineoplastic chemotherapy: Secondary | ICD-10-CM

## 2014-12-21 DIAGNOSIS — C189 Malignant neoplasm of colon, unspecified: Secondary | ICD-10-CM

## 2014-12-21 LAB — COMPREHENSIVE METABOLIC PANEL (CC13)
ALBUMIN: 3.2 g/dL — AB (ref 3.5–5.0)
ALT: 47 U/L (ref 0–55)
AST: 35 U/L — ABNORMAL HIGH (ref 5–34)
Alkaline Phosphatase: 139 U/L (ref 40–150)
Anion Gap: 9 mEq/L (ref 3–11)
BUN: 14.7 mg/dL (ref 7.0–26.0)
CALCIUM: 8.6 mg/dL (ref 8.4–10.4)
CO2: 25 mEq/L (ref 22–29)
Chloride: 109 mEq/L (ref 98–109)
Creatinine: 0.8 mg/dL (ref 0.6–1.1)
EGFR: 84 mL/min/{1.73_m2} — ABNORMAL LOW (ref >90)
Glucose: 133 mg/dl (ref 70–140)
POTASSIUM: 3.6 meq/L (ref 3.5–5.1)
Sodium: 143 mEq/L (ref 136–145)
Total Bilirubin: 0.38 mg/dL (ref 0.20–1.20)
Total Protein: 6 g/dL — ABNORMAL LOW (ref 6.4–8.3)

## 2014-12-21 LAB — CBC WITH DIFFERENTIAL/PLATELET
BASO%: 1.2 % (ref 0.0–2.0)
BASOS ABS: 0 10*3/uL (ref 0.0–0.1)
EOS%: 1.1 % (ref 0.0–7.0)
Eosinophils Absolute: 0 10*3/uL (ref 0.0–0.5)
HCT: 28.2 % — ABNORMAL LOW (ref 34.8–46.6)
HGB: 8.5 g/dL — ABNORMAL LOW (ref 11.6–15.9)
LYMPH%: 37.7 % (ref 14.0–49.7)
MCH: 21.9 pg — AB (ref 25.1–34.0)
MCHC: 30.2 g/dL — ABNORMAL LOW (ref 31.5–36.0)
MCV: 72.7 fL — AB (ref 79.5–101.0)
MONO#: 0.6 10*3/uL (ref 0.1–0.9)
MONO%: 14.9 % — ABNORMAL HIGH (ref 0.0–14.0)
NEUT#: 1.7 10*3/uL (ref 1.5–6.5)
NEUT%: 45.1 % (ref 38.4–76.8)
Platelets: 153 10*3/uL (ref 145–400)
RBC: 3.87 10*6/uL (ref 3.70–5.45)
RDW: 23.1 % — ABNORMAL HIGH (ref 11.2–14.5)
WBC: 3.8 10*3/uL — ABNORMAL LOW (ref 3.9–10.3)
lymph#: 1.4 10*3/uL (ref 0.9–3.3)

## 2014-12-21 LAB — CEA: CEA: 0.9 ng/mL (ref 0.0–5.0)

## 2014-12-21 MED ORDER — DEXTROSE 5 % IV SOLN
85.0000 mg/m2 | Freq: Once | INTRAVENOUS | Status: AC
  Administered 2014-12-21: 185 mg via INTRAVENOUS
  Filled 2014-12-21: qty 37

## 2014-12-21 MED ORDER — DEXTROSE 5 % IV SOLN
Freq: Once | INTRAVENOUS | Status: AC
  Administered 2014-12-21: 10:00:00 via INTRAVENOUS

## 2014-12-21 MED ORDER — SODIUM CHLORIDE 0.9 % IV SOLN
510.0000 mg | Freq: Once | INTRAVENOUS | Status: AC
  Administered 2014-12-21: 510 mg via INTRAVENOUS
  Filled 2014-12-21: qty 17

## 2014-12-21 MED ORDER — DEXAMETHASONE SODIUM PHOSPHATE 10 MG/ML IJ SOLN
INTRAMUSCULAR | Status: AC
  Filled 2014-12-21: qty 1

## 2014-12-21 MED ORDER — ONDANSETRON 8 MG/50ML IVPB (CHCC)
8.0000 mg | Freq: Once | INTRAVENOUS | Status: AC
  Administered 2014-12-21: 8 mg via INTRAVENOUS

## 2014-12-21 MED ORDER — LEUCOVORIN CALCIUM INJECTION 350 MG
400.0000 mg/m2 | Freq: Once | INTRAVENOUS | Status: AC
  Administered 2014-12-21: 860 mg via INTRAVENOUS
  Filled 2014-12-21: qty 43

## 2014-12-21 MED ORDER — SODIUM CHLORIDE 0.9 % IV SOLN
Freq: Once | INTRAVENOUS | Status: AC
  Administered 2014-12-21: 13:00:00 via INTRAVENOUS

## 2014-12-21 MED ORDER — DEXAMETHASONE SODIUM PHOSPHATE 10 MG/ML IJ SOLN
10.0000 mg | Freq: Once | INTRAMUSCULAR | Status: AC
  Administered 2014-12-21: 10 mg via INTRAVENOUS

## 2014-12-21 MED ORDER — SODIUM CHLORIDE 0.9 % IJ SOLN
10.0000 mL | INTRAMUSCULAR | Status: DC | PRN
  Administered 2014-12-21: 10 mL via INTRAVENOUS
  Filled 2014-12-21: qty 10

## 2014-12-21 MED ORDER — ONDANSETRON 8 MG/NS 50 ML IVPB
INTRAVENOUS | Status: AC
  Filled 2014-12-21: qty 8

## 2014-12-21 MED ORDER — RIVAROXABAN 20 MG PO TABS: 20.0000 mg | ORAL_TABLET | Freq: Every day | ORAL | Status: DC

## 2014-12-21 MED ORDER — FLUOROURACIL CHEMO INJECTION 2.5 GM/50ML
400.0000 mg/m2 | Freq: Once | INTRAVENOUS | Status: AC
  Administered 2014-12-21: 850 mg via INTRAVENOUS
  Filled 2014-12-21: qty 17

## 2014-12-21 MED ORDER — SODIUM CHLORIDE 0.9 % IV SOLN
2400.0000 mg/m2 | INTRAVENOUS | Status: DC
  Administered 2014-12-21: 5150 mg via INTRAVENOUS
  Filled 2014-12-21: qty 103

## 2014-12-21 NOTE — Patient Instructions (Signed)

## 2014-12-21 NOTE — Progress Notes (Signed)
[place]  [phone] Fax:([phone]) [place] Note   Patient Care Team: [name], MD as PCP - General (Family Medicine) 12/22/2014  CHIEF COMPLAINTS/PURPOSE OF CONSULTATION:  Synchronized three colon cancer, stage III    Colorectal cancer, stage III   10/04/2014 Tumor Marker CEA 1.0 .  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 mpT4aN2aM0,stage IIIC.    10/10/2014 Surgery partial right hemicolectomy with removal of terminal ileum, sigmoid colectomy, right ureter lysis, (+) residual surgical margin    11/23/2014 -  Chemotherapy FOLFOX every 2 weeks    OTHER ISSUES: 1. Pulmonary embolism in segment omental branches of the right lung, diagnosed on 11/28/2014 2. Right ureter stent placed on 10/20/2014  CURRENT THERAPY: FOLFOX every 2 weeks, started on 11/23/2014, plan for 12 cycles.   HISTORY OF PRESENTING ILLNESS:  Allison Mitchell 54 y.o. female is here for follow-up after her hospital discharge. She was recently diagnosed with 3 synchronous to colon cancer, status post surgical resection. I initially saw her when she was diagnosed in the hospital.  She presented with some GI symptoms since last October 2014 at which time she was diagnosed with ischemic colitis. Due to guaiac-positive stools, patient had undergone an outpatient colonoscopy on 09/26/2014, which showed inflamed and ulcerated mucosa in the terminal ileum, and a 1.5 cm mass in the ascending colon and additional 1.5 cm mass in the mid sigmoid colon. All biopsy from the above 3 sites came back positive for invasive moderate differentiated adenocarcinoma, and the sigmoid mass containing squamous cell differentiation.   She was hospitalized for worsening of her symptoms on 10/03/2014.  A CT of the abdomen and  pelvis with contrast on 10/02/2014 was remarkable for soft tissue/bowel wall thickening of the right lower pelvis center at the cecum-terminal ileum involving a portion of the adjacent sigmoid colon. This causes small bowel obstruction and obstructed the distal right ureter causing moderate to severe right hydroureteronephrosis. She was transferred [facility] to [facility] for surgical management and further testing. CT of the chest with contrast on 10/03/2014 negative for metastatic disease. MRI of the liver without and without contrast on 10/03/2014 is remarkable for small hepatic cysts, but no findings suspicious for hepatic metastatic disease. Right-sided hydroureteronephrosis was once again seen, with early small bowel obstruction bowel gas pattern.  She underwent right hemicolectomy with primary anastomosis with sigmoid colectomy with end colostomy on Friday, Dec 5, after cystoscopy is performed on 12/4 to rule out GU invasion and a ureteral stent placement.  She has family history of colon cancer in her grandmother and polyps in her sister. Denies risk factors for HIV or hepatitis. Denies Diabetes. No Tobacco. No ETOH. Denies prior radiation. SHe did consume significant amount of red meat until recently. We are asked to see the patient in consultation, with recommendations on the oncology standpoint  She is recovering slowly after the surgery. She had the surgical suture opened about 3 weeks ago and was treated for antibiotics , and now much improved. She has no significant pain, but mild abdominal discomfort, no nausea, her stool is getting thicker, she clean her colostomy bag daily. She was seen by Dr. Hulen Skains this morning. She noticed some cloudy urine in the past few days, with mild burning sensation, no fever or back pain. Her appetite is getting  better, eats well.  She lost about 25 lbs. No fever or chills. No other new complains.   INTERIM HISTORY: Allison Mitchell returns for follow up  and circumflex cycle of chemotherapy. She tolerated the second cycle well, she has moderate appetite, no significant nausea or vomiting, no significant weight loss since she started chemotherapy. No fever or chills. Mild fatigue and dyspnea on exertion. She goes to work for half a day, and taken a nap in the afternoon. No other new complaints.  MEDICAL HISTORY:  Past Medical History  Diagnosis Date  . GERD (gastroesophageal reflux disease)   . Depression   . Colon cancer 10/10/14    colon adenocarcinoma  . Anxiety     SURGICAL HISTORY: Past Surgical History  Procedure Laterality Date  . Cystoscopy with retrograde pyelogram, ureteroscopy and stent placement Right 10/05/2014    Procedure: [place], URETEROSCOPY  AND STENT PLACEMENT;  Surgeon: Alexis Frock, MD;  Location: [facility];  Service: Urology;  Laterality: Right;  . Laparotomy N/A 10/10/2014    Procedure: EXPLORATORY LAPAROTOMY;  Surgeon: Doreen Salvage, MD;  Location: [place];  Service: General;  Laterality: N/A;  . Partial colectomy  10/10/2014    Procedure: PARTIAL SIGMOID COLECTOMY;  Surgeon: Doreen Salvage, MD;  Location: [place];  Service: General;;  . Colostomy  10/10/2014    Procedure: COLOSTOMY;  Surgeon: Doreen Salvage, MD;  Location: [place];  Service: General;;  . Colostomy revision  10/10/2014    Procedure: PARTIAL RIGHT COLECTOMY;  Surgeon: Doreen Salvage, MD;  Location: [place];  Service: General;;  . Ureteral reimplantion Right 10/10/2014    Procedure: OPEN RIGHT URETERAL LYSIS ;  Surgeon: Alexis Frock, MD;  Location: [place];  Service: Urology;  Laterality: Right;  . Portacath placement N/A 10/17/2014    Procedure: INSERTION PORT-A-CATH LEFT SUBCLAVIAN AND MIDLINE INCISION STAPLE REMOVAL ;  Surgeon: Coralie Keens, MD;  Location: [place];  Service: General;  Laterality: N/A;    SOCIAL HISTORY: History   Social History  . Marital Status: Married    Spouse Name: N/A  . Number of Children: N/A  . Years of Education:  N/A   Occupational History  . Not on file.   Social History Main Topics  . Smoking status: Never Smoker   . Smokeless tobacco: Never Used  . Alcohol Use: No  . Drug Use: No  . Sexual Activity: No   Other Topics Concern  . Not on file   Social History Narrative    FAMILY HISTORY: Family History  Problem Relation Age of Onset  . Cancer Mother     skin cancer   . Diabetes Father   . CAD Father     Onset in his 17s, has 7 stents  . Cancer Maternal Grandmother 13    colon cancer   . Cancer Paternal Grandmother 65    breast cancer     ALLERGIES:  is allergic to acyclovir and related and amoxicillin.  MEDICATIONS:  Current Outpatient Prescriptions  Medication Sig Dispense Refill  . citalopram (CELEXA) 20 MG tablet Take 40 mg by mouth daily.  1  . enoxaparin (LOVENOX) 100 MG/ML injection Inject 0.95 mLs (95 mg total) into the skin every 12 (twelve) hours. 60 Syringe 0  . HYDROcodone-acetaminophen (NORCO/VICODIN) 5-325 MG per tablet Take 1 tablet by mouth every 6 (six) hours as needed for moderate pain. 30 tablet 0  . lidocaine-prilocaine (EMLA) cream Apply 1 application topically as needed. 30 g 0  . magnesium hydroxide (MILK  OF MAGNESIA) 400 MG/5ML suspension Take 5 mLs by mouth daily as needed for mild constipation.    Marland Kitchen omeprazole (PRILOSEC) 40 MG capsule Take 40 mg by mouth every morning.  12  . ondansetron (ZOFRAN) 8 MG tablet Take 1 tablet (8 mg total) by mouth every 8 (eight) hours as needed for nausea or vomiting. 20 tablet 0  . phenazopyridine (PYRIDIUM) 200 MG tablet Take 200 mg by mouth 2 (two) times daily as needed (uti).   0  . psyllium (METAMUCIL) 58.6 % powder Take 1 packet by mouth as needed (constipation).    . rivaroxaban (XARELTO) 20 MG TABS tablet Take 1 tablet (20 mg total) by mouth daily with supper. 30 tablet 5  . traMADol (ULTRAM) 50 MG tablet Take 1 tablet (50 mg total) by mouth every 6 (six) hours as needed. 40 tablet 1  . zolpidem (AMBIEN) 5 MG  tablet Take 1 tablet (5 mg total) by mouth at bedtime as needed for sleep. 20 tablet 0   No current facility-administered medications for this visit.    REVIEW OF SYSTEMS:   Constitutional: Denies fevers, chills or abnormal night sweats Eyes: Denies blurriness of vision, double vision or watery eyes Ears, nose, mouth, throat, and face: Denies mucositis or sore throat Respiratory: Denies cough, dyspnea or wheezes Cardiovascular: Denies palpitation, chest discomfort or lower extremity swelling Gastrointestinal:  Denies nausea, heartburn or change in bowel habits Skin: Denies abnormal skin rashes Lymphatics: Denies new lymphadenopathy or easy bruising Neurological:Denies numbness, tingling or new weaknesses Behavioral/Psych: Mood is stable, no new changes  All other systems were reviewed with the patient and are negative.  PHYSICAL EXAMINATION: ECOG PERFORMANCE STATUS: 1 - Symptomatic but completely ambulatory  Filed Vitals:   There were no vitals filed for this visit.  GENERAL:alert, no distress and comfortable SKIN: skin color, texture, turgor are normal, no rashes or significant lesions EYES: normal, conjunctiva are pink and non-injected, sclera clear OROPHARYNX:no exudate, no erythema and lips, buccal mucosa, and tongue normal  NECK: supple, thyroid normal size, non-tender, without nodularity LYMPH:  no palpable lymphadenopathy in the cervical, axillary or inguinal LUNGS: clear to auscultation and percussion with normal breathing effort HEART: regular rate & rhythm and no murmurs and no lower extremity edema ABDOMEN:abdomen soft, non-tender and normal bowel sounds. Positive for colostomy bag on the left, with normal-appearing stool. Surgical wound upper part is well-healed. Musculoskeletal:no cyanosis of digits and no clubbing  PSYCH: alert & oriented x 3 with fluent speech NEURO: no focal motor/sensory deficits  LABORATORY DATA:  CBC Latest Ref Rng 12/21/2014 12/07/2014  11/29/2014  WBC 3.9 - 10.3 10e3/uL 3.8(L) 3.8(L) 5.0  Hemoglobin 11.6 - 15.9 g/dL 8.5(L) 8.7(L) 8.7(L)  Hematocrit 34.8 - 46.6 % 28.2(L) 29.4(L) 29.3(L)  Platelets 145 - 400 10e3/uL 153 303 169    CMP Latest Ref Rng 12/21/2014 12/07/2014 11/29/2014  Glucose 70 - 140 mg/dl 133 115 107(H)  BUN 7.0 - 26.0 mg/dL 14.7 12.6 16  Creatinine 0.6 - 1.1 mg/dL 0.8 0.9 0.84  Sodium 136 - 145 mEq/L 143 142 136  Potassium 3.5 - 5.1 mEq/L 3.6 4.4 3.9  Chloride 96 - 112 mmol/L - - 104  CO2 22 - 29 mEq/L _0 Calcium 8.4 - 10.4 mg/dL 8.6 8.7 8.4  Total Protein 6.4 - 8.3 g/dL 6.0(L) 6.2(L) -  Total Bilirubin 0.20 - 1.20 mg/dL 0.38 0.28 -  Alkaline Phos 40 - 150 U/L 139 139 -  AST 5 - 34 U/L 35(H) 56(H) -  ALT 0 - 55 U/L 47 129(H) -     Surgical path 10/10/2014 1. Colon, segmental resection for tumor, Cecum, terminal ileum, sigmoid colon - MULTIFOCAL INVASIVE ADENOCARCINOMA, MODERATELY DIFFERENTIATED, THE LARGEST FOCUS SPANS 4.0 CM. - ADENOCARCINOMA INVOLVES SEROSA. - LYMPHOVASCULAR INVASION IS IDENTIFIED. - METASTATIC CARCINOMA IN 5 OF 17 LYMPH NODES (5/17). - THE PROXIMAL AND DISTAL SURGICAL RESECTION MARGINS ARE NEGATIVE FOR ADENOCARCINOMA. - SEE ONCOLOGY TABLE BELOW. 2. Soft tissue, biopsy, Right periureteral tissue - ADENOCARCINOMA. 3. Soft tissue, biopsy, Right periureteral tissue - INFLAMED AND DEGENERATING FIBROADIPOSE TISSUE. - THERE IS NO EVIDENCE OF MALIGNANCY. Microscopic Comment 1. COLON AND RECTUM (INCLUDING TRANS-ANAL RESECTION): Specimen: Distal ileum, cecum, proximal ascending colon, and adherent sigmoid colon. Procedure: Resection. Tumor site: Multiple foci, including ileocecal valve, ascending colon and sigmoid. Specimen integrity: Disrupted. Macroscopic intactness of mesorectum: N/A Macroscopic tumor perforation: Present. Invasive tumor: Maximum size: 4.0 cm. Histologic type(s): Adenocarcinoma. Histologic grade and differentiation: G2: moderately differentiated Type  of polyp in which invasive carcinoma arose: Tubulovillous adenomas. Microscopic extension of invasive tumor: Focally involves the serosa. Lymph-Vascular invasion: Present. Peri-neural invasion: Not identified. Tumor deposit(s) (discontinuous extramural extension): Not identified. Resection margins: Proximal margin: 4.0 cm Distal margin: 3.5 cm Circumferential (radial) (posterior ascending, posterior descending; lateral and posterior mid-rectum; and entire lower 1/3 rectum): Cannot be assessed. Mesenteric margin (sigmoid and transverse): Cannot be assessed. Treatment effect (neo-adjuvant therapy): N/A Additional polyp(s): Not identified. Non-neoplastic findings: No significant findings. Lymph nodes: number examined 17; number positive: 5 Pathologic Staging: mpT4a, pN2a, pMX Ancillary studies: A block will be sent for MSI testing by [facility] and MSI testing by PCR and the results reported separately. Comment: There appear to be three synchronous primary tumors in the specimen, one at the ileocecal valve, one in the ascending colon, and one at the sigmoid colon. Each of these foci contain an associated tubulovillous adenoma, suggesting three separate primary sites. The tumor in the sigmoid colon appears to involve the serosa as well. The other two tumors show extension into at least the pericolonic soft tissue. Dr Mali Rund has reviewed selected slides and concurs that there are likely three synchronous tumors here. The case was discussed with Dr Hulen Skains. Per Dr Hulen Skains, the specimen in #2 is a contiguous piece of tissue 2 of 4 Amended copy Amended FINAL for Allison, Mitchell [phone]) Microscopic Comment(continued) with the main tumor, and therefore, not considered a distant metastatic focus. Therefore, the tumor is staged as an MX. (JBK:ecj 10/16/2014) JOSHUA  Mismatch Repair (MMR) Protein Immunohistochemistry (IHC) IHC Expression Result: MLH1: Preserved nuclear expression (greater 50%  tumor expression) MSH2: Preserved nuclear expression (greater 50% tumor expression) MSH6: Preserved nuclear expression (greater 50% tumor expression) PMS2: Preserved nuclear expression (greater 50% tumor expression) * Internal control demonstrates intact nuclear expression Interpretation: NORMAL There is preserved expression  RADIOGRAPHIC STUDIES: I have personally reviewed the radiological images as listed and agreed with the findings in the report.  CT of abdomen and pelvis 11/14/2014 New complex fluid collection in the right pelvis measuring 2.9 x 5.9cm. Postop abscess cannot be excluded.  Decrease right hydronephrosis with ureteral stent in appropriate position.  No evidence of abdominal or pelvic metastatic disease.   ASSESSMENT & PLAN:  53 year old Caucasian female with minimal past medical history, who was found to have 3 multifocal colon cancer at terminal ileum/cecum, ascending colon and sigmoid colon. All of them has adenocarcinoma,  And the cecum tumor has squamous differentiation. The cecum tumor has directly invaded the soft tissue along the right ureter,  which was not completely resected (1% tumor left over, per Dr. Hulen Skains). She has mpT4aN2aM0 stage IIIB, MSI/MMR normal.   1. Multifocal stage IIIB colon cancer, MMR normal, MSI stable   -I discussed her surgical past findings extensively with patient and her husband. Unfortunately, the tumor was not completely resected. She has very small amount of tumor left over. -Given her residual tumor after surgery, advanced stage III disease, high risk recurrence in the future, I strongly recommend adjuvant chemotherapy with the standard regimen FOLFOX. Side effects were discussed with patient in great detail. She agrees to proceed. -I'll discuss with his radiation oncologist Dr. Lisbeth Renshaw about the case sequence of chemotherapy and irradiation. Both of [place] agreed to start chemotherapy for a few months first, then start radiation with or  without sensitizing chemotherapy 5-FU, , then finish the rest of the adjuvant chemotherapy for total of 6 months. -I encouraged her to continue healthy diet nutrition supplement and being physically active, to recover better.  2. PE -She will continue Lovenox injection for at least a month, she would like to switch to oral anticoagulant afterwards.  -I discussed the option of committing and Xarelto, the benefit and risks were discussed. - I sent a prescription of Xarelto to her pharmacy today. If she has high co-pay, she is agreeable with Coumadin.   3. Right pelvic cyst on CT 11/14/2014 -This is likely a seroma secondary to surgery. Giving her afebrile, normal white count, no abdominal pain or tenderness, this is unlikely a abscess. -I have discussed with her surgeon Dr. Hulen Skains, he agrees with close monitoring.  3. right ureter stent placement -She will follow up with her urologist. She is scheduled for a stent exchange in early March.  5. 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 Her iron study is consistent with iron deficient anemia -This is secondary to the bleeding from her colon cancer. -Given her recent GI surgery, I'll give her IV Feraheme 562m twice in the next few weeks.  Plan, -cycle 3 FOLFOX today   All questions were answered. The patient knows to call the clinic with any problems, questions or concerns.  I spent 20 minutes counseling the patient face to face. The total time spent in the appointment was 25 minutes and more than 50% was on counseling.     FTruitt Merle MD 12/22/2014 7:36 AM

## 2014-12-21 NOTE — Patient Instructions (Addendum)
[place] Discharge Instructions for Patients Receiving Chemotherapy  Today you received the following chemotherapy agents 45fu, oxaliplatin, leucovorin  To help prevent nausea and vomiting after your treatment, we encourage you to take your nausea medication as needed   If you develop nausea and vomiting that is not controlled by your nausea medication, call the clinic.   BELOW ARE SYMPTOMS THAT SHOULD BE REPORTED IMMEDIATELY:  *FEVER GREATER THAN 100.5 F  *CHILLS WITH OR WITHOUT FEVER  NAUSEA AND VOMITING THAT IS NOT CONTROLLED WITH YOUR NAUSEA MEDICATION  *UNUSUAL SHORTNESS OF BREATH  *UNUSUAL BRUISING OR BLEEDING  TENDERNESS IN MOUTH AND THROAT WITH OR WITHOUT PRESENCE OF ULCERS  *URINARY PROBLEMS  *BOWEL PROBLEMS  UNUSUAL RASH Items with * indicate a potential emergency and should be followed up as soon as possible.  Feel free to call the clinic you have any questions or concerns. The clinic phone number is [phone].   Ferumoxytol injection What is this medicine? FERUMOXYTOL is an iron complex. Iron is used to make healthy red blood cells, which carry oxygen and nutrients throughout the body. This medicine is used to treat iron deficiency anemia in people with chronic kidney disease. This medicine may be used for other purposes; ask your health care provider or pharmacist if you have questions. COMMON BRAND NAME(S): Feraheme What should I tell my health care provider before I take this medicine? They need to know if you have any of these conditions: -anemia not caused by low iron levels -high levels of iron in the blood -magnetic resonance imaging (MRI) test scheduled -an unusual or allergic reaction to iron, other medicines, foods, dyes, or preservatives -pregnant or trying to get pregnant -breast-feeding How should I use this medicine? This medicine is for injection into a vein. It is given by a health care professional in a hospital or  clinic setting. Talk to your pediatrician regarding the use of this medicine in children. Special care may be needed. Overdosage: If you think you've taken too much of this medicine contact a poison control center or emergency room at once. Overdosage: If you think you have taken too much of this medicine contact a poison control center or emergency room at once. NOTE: This medicine is only for you. Do not share this medicine with others. What if I miss a dose? It is important not to miss your dose. Call your doctor or health care professional if you are unable to keep an appointment. What may interact with this medicine? This medicine may interact with the following medications: -other iron products This list may not describe all possible interactions. Give your health care provider a list of all the medicines, herbs, non-prescription drugs, or dietary supplements you use. Also tell them if you smoke, drink alcohol, or use illegal drugs. Some items may interact with your medicine. What should I watch for while using this medicine? Visit your doctor or healthcare professional regularly. Tell your doctor or healthcare professional if your symptoms do not start to get better or if they get worse. You may need blood work done while you are taking this medicine. You may need to follow a special diet. Talk to your doctor. Foods that contain iron include: whole grains/cereals, dried fruits, beans, or peas, leafy green vegetables, and organ meats (liver, kidney). What side effects may I notice from receiving this medicine? Side effects that you should report to your doctor or health care professional as soon as possible: -allergic reactions like skin rash,  itching or hives, swelling of the face, lips, or tongue -breathing problems -changes in blood pressure -feeling faint or lightheaded, falls -fever or chills -flushing, sweating, or hot feelings -swelling of the ankles or feet Side effects that  usually do not require medical attention (Report these to your doctor or health care professional if they continue or are bothersome.): -diarrhea -headache -nausea, vomiting -stomach pain This list may not describe all possible side effects. Call your doctor for medical advice about side effects. You may report side effects to FDA at 1-800-FDA-1088. Where should I keep my medicine? This drug is given in a hospital or clinic and will not be stored at home. NOTE: This sheet is a summary. It may not cover all possible information. If you have questions about this medicine, talk to your doctor, pharmacist, or health care provider.  2015, Elsevier/Gold Standard. (2012-06-04 15:23:36)

## 2014-12-22 ENCOUNTER — Encounter: Payer: Self-pay | Admitting: Hematology

## 2014-12-23 ENCOUNTER — Ambulatory Visit (HOSPITAL_BASED_OUTPATIENT_CLINIC_OR_DEPARTMENT_OTHER): Payer: BLUE CROSS/BLUE SHIELD

## 2014-12-23 DIAGNOSIS — C187 Malignant neoplasm of sigmoid colon: Secondary | ICD-10-CM

## 2014-12-23 DIAGNOSIS — C19 Malignant neoplasm of rectosigmoid junction: Secondary | ICD-10-CM

## 2014-12-23 DIAGNOSIS — C182 Malignant neoplasm of ascending colon: Secondary | ICD-10-CM

## 2014-12-23 DIAGNOSIS — C180 Malignant neoplasm of cecum: Secondary | ICD-10-CM

## 2014-12-23 MED ORDER — SODIUM CHLORIDE 0.9 % IJ SOLN
10.0000 mL | INTRAMUSCULAR | Status: DC | PRN
  Administered 2014-12-23: 10 mL
  Filled 2014-12-23: qty 10

## 2014-12-23 MED ORDER — HEPARIN SOD (PORK) LOCK FLUSH 100 UNIT/ML IV SOLN
500.0000 [IU] | Freq: Once | INTRAVENOUS | Status: AC | PRN
  Administered 2014-12-23: 500 [IU]
  Filled 2014-12-23: qty 5

## 2014-12-26 ENCOUNTER — Encounter: Payer: Self-pay | Admitting: *Deleted

## 2014-12-26 ENCOUNTER — Telehealth: Payer: Self-pay | Admitting: *Deleted

## 2014-12-26 NOTE — Telephone Encounter (Signed)
Allison Mitchell reports she has enough Lovenox to last through 12-29-2014.  Would like to speak with provider to determine if she needs to continue Lovenox or change to an oral agent.  Uses [employer].  Return call number [phone].

## 2014-12-26 NOTE — Telephone Encounter (Signed)
xaledo script was sent in 12/21/14 by Dr Burr Medico.  Will notify pt.

## 2014-12-26 NOTE — Telephone Encounter (Signed)
Unable to reach pt by phone # listed.  Left message for pt to call in am.  Will try to send message via MyChart if she is registered.

## 2014-12-27 ENCOUNTER — Other Ambulatory Visit: Payer: Self-pay | Admitting: *Deleted

## 2014-12-27 ENCOUNTER — Other Ambulatory Visit: Payer: Self-pay | Admitting: Hematology

## 2014-12-27 DIAGNOSIS — I2699 Other pulmonary embolism without acute cor pulmonale: Secondary | ICD-10-CM

## 2014-12-27 MED ORDER — WARFARIN SODIUM 5 MG PO TABS: 5.0000 mg | ORAL_TABLET | Freq: Every day | ORAL | Status: DC

## 2014-12-27 MED ORDER — ENOXAPARIN SODIUM 100 MG/ML ~~LOC~~ SOLN: 95.0000 mg | Freq: Two times a day (BID) | SUBCUTANEOUS | Status: DC

## 2014-12-27 NOTE — Telephone Encounter (Signed)
Called pt this am & she reports that her husband will not let her take xarelto due to class action lawsuit.  She states that she spoke to Dr. Burr Medico about coumadin.  Will discuss with Dr Burr Medico.

## 2014-12-27 NOTE — Telephone Encounter (Signed)
Talked with husband earlier today & explained that pt would have to be on lovenox for a while along with coumadin & check lab/PT/INR to determine when she can stop lovenox.  Left message per Dr Burr Medico that script for coumadin 5 mg daily to start 12/28/14 evening & lovenox inj also called in to cont as before.  Pt should get appt for coumadin clinic for Monday.  Referral made by Dr Burr Medico.  Asked husband to call back tomorrow if questions. Husband's cell # [phone].

## 2014-12-28 ENCOUNTER — Telehealth: Payer: Self-pay | Admitting: Pharmacist

## 2014-12-28 NOTE — Telephone Encounter (Signed)
This nurse closing encounter after noting appropriate documentation by collaborative nurse.

## 2014-12-28 NOTE — Telephone Encounter (Signed)
Returned phone call to patient.  She was out of office, spoke with husband. He told me patient was scheduled for surgery on March 9 and Janifer Adie is trying to get in touch with Dr. Tammi Klippel regarding not starting coumadin until after surgery. Pt is currently on lovenox (since Jan) so she would be covered up to surgery and would need a bridge after surgery.

## 2014-12-28 NOTE — Telephone Encounter (Signed)
LVM for pt to call back and set up her first appmt with [facility].   She is a bridge from lovenox to coumadin. Started coumadin on 12/28/14 and Dr. Burr Medico requests we see her on Mon, 01/01/15.

## 2014-12-28 NOTE — Telephone Encounter (Signed)
This nurse has had conversations with pt & pt's husband.  Discussed coumadin & script called in to start this pm but husband reports that stent placement is scheduled for 12/12/14 with Dr Tresa Moore & wanted to make sure that the coumadin is OK with him.  Call made to Dr [facility] office after discussing with Dr Burr Medico to get his input.  Dr Burr Medico is considering keeping pt on lovenox until after the stent & will need to find out if pt will need to hold lovenox.  Informed pt's husband to not start coumadin until we tell them to. He expressed understanding.

## 2015-01-01 ENCOUNTER — Telehealth: Payer: Self-pay | Admitting: *Deleted

## 2015-01-01 NOTE — Telephone Encounter (Signed)
Received return call from [place], [place] @ Dr. Zettie Pho office at [facility] re:  Per Dr. Tresa Moore,  [place] for pt to take Coumadin now.  Pt can continue with coumadin at around surgery time since it is only  for stent changed.   Message to Dr. Burr Medico for review. [name][facility]  Phone    [phone]  Ext   [phone].

## 2015-01-01 NOTE — Telephone Encounter (Signed)
Spoke with [facility] @ Dr. Zettie Pho office at [facility].   Informed [name] that Dr. Burr Medico would like to have Dr. Zettie Pho input re: should pt be switched from Lovenox to Coumadin prior to her surgery cystoscopy with stent placement or should pt wait to switched to Coumadin after surgery.  If before surgery, what would Dr. Zettie Pho instructions for regarding when to stop coumadin and when to restart again.  [name] to relay message to Dr. Tresa Moore.  Tobie Poet direct phone number to nurses' desk for return call. [name]  Phone     [phone]  Ext   [phone].

## 2015-01-02 NOTE — Progress Notes (Addendum)
Follow Up New Consult: Colon Cancer stage III s/p surgical resection   Chemotherapy : Folfox  Every 2 weeks, started 11/23/14,plan for 12 cycles Dr. 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 Alexis Frock appt for Cystoscopy with retrograde pyleogram/uretral stent replacement   Still having dysuria and hematuria still when voiding, colostomy bag stoma red, looks good, having soft stool in bag, c/o pain in her kidneys 7/8 when she goes to bathroom, takes uribel every 8 hours prn , Lovenox 0.66ml q 12 hours injection, abdomen bruised form injectins,  Drinks flavored waters, appetite good, energy level better today, wanes after lunch, still working

## 2015-01-03 ENCOUNTER — Ambulatory Visit
Admission: RE | Admit: 2015-01-03 | Discharge: 2015-01-03 | Disposition: A | Discharge status: Home / Self Care | Payer: BLUE CROSS/BLUE SHIELD | Source: Ambulatory Visit | Attending: Radiation Oncology | Admitting: Radiation Oncology

## 2015-01-03 ENCOUNTER — Encounter: Payer: Self-pay | Admitting: Radiation Oncology

## 2015-01-03 VITALS — BP 137/64 | HR 80 | Temp 98.4°F | Resp 20 | Ht 68.0 in | Wt 213.1 lb

## 2015-01-03 DIAGNOSIS — C19 Malignant neoplasm of rectosigmoid junction: Secondary | ICD-10-CM | POA: Diagnosis present

## 2015-01-03 DIAGNOSIS — C189 Malignant neoplasm of colon, unspecified: Secondary | ICD-10-CM

## 2015-01-03 NOTE — Progress Notes (Signed)
Please see the Nurse Progress Note in the MD Initial Consult Encounter for this patient. 

## 2015-01-03 NOTE — Progress Notes (Signed)
Radiation Oncology         [phone] ________________________________  Name: Allison Mitchell MRN: [id number]  Date: 01/03/2015  DOB: 1961-07-28  Follow-Up Visit Note  CC: [name], MD  Truitt Merle, MD  Diagnosis:      Colorectal cancer, stage III   10/04/2014 Tumor Marker CEA 1.0 .  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 [id number],stage IIIC.    10/10/2014 Surgery partial right hemicolectomy with removal of terminal ileum, sigmoid colectomy, right ureter lysis, (+) residual surgical margin    11/23/2014 -  Chemotherapy FOLFOX every 2 weeks      Narrative:  The patient returns today for routine follow-up.    the patient the patient has proceeded with chemotherapy since she was last seen. She is taking FOLFOX chemotherapy on an every 2 week basis. She continues to complain of some dysuria. She is scheduled to have her stent replaced next week by urology. She does complain of some fatigue. One episode of nausea but has done well with this otherwise.                             ALLERGIES:  is allergic to acyclovir and related and amoxicillin.  Meds: Current Outpatient Prescriptions  Medication Sig Dispense Refill  . citalopram (CELEXA) 20 MG tablet Take 40 mg by mouth daily.  1  . enoxaparin (LOVENOX) 100 MG/ML injection Inject 0.95 mLs (95 mg total) into the skin every 12 (twelve) hours. 20 Syringe 0  . HYDROcodone-acetaminophen (NORCO/VICODIN) 5-325 MG per tablet Take 1 tablet by mouth every 6 (six) hours as needed for moderate pain. 30 tablet 0  . lidocaine-prilocaine (EMLA) cream Apply 1 application topically as needed. 30 g 0  . magnesium hydroxide (MILK OF MAGNESIA) 400 MG/5ML suspension Take 5 mLs by mouth daily as needed for mild constipation.    . Meth-Hyo-M Bl-Na Phos-Ph Sal (URIBEL) 118 MG CAPS Take  1 capsule by mouth as needed. Every 8 hours as needed  11  . omeprazole (PRILOSEC) 40 MG capsule Take 40 mg by mouth every morning.  12  . ondansetron (ZOFRAN) 8 MG tablet Take 1 tablet (8 mg total) by mouth every 8 (eight) hours as needed for nausea or vomiting. 20 tablet 0  . zolpidem (AMBIEN) 5 MG tablet Take 1 tablet (5 mg total) by mouth at bedtime as needed for sleep. 20 tablet 0  . psyllium (METAMUCIL) 58.6 % powder Take 1 packet by mouth as needed (constipation).    . traMADol (ULTRAM) 50 MG tablet Take 1 tablet (50 mg total) by mouth every 6 (six) hours as needed. (Patient not taking: Reported on 01/03/2015) 40 tablet 1  . warfarin (COUMADIN) 5 MG tablet Take 1 tablet (5 mg total) by mouth daily. To start 12/28/14 in evening (Patient not taking: Reported on 01/03/2015) 30 tablet 1   No current facility-administered medications for this encounter.    Physical Findings: The patient is in no acute distress. Patient is alert and oriented.  height is 5' 8" (1.727 m) and weight is 213 lb 1.6 oz (96.662 kg). Her oral temperature is 98.4 F (36.9 C). Her blood pressure is 137/64 and her pulse is 80. Her respiration is 20 and oxygen saturation is 100%. .     Lab Findings: Lab Results  Component Value Date   WBC 3.8* 12/21/2014   HGB 8.5* 12/21/2014   HCT 28.2* 12/21/2014   MCV 72.7* 12/21/2014   PLT 153 12/21/2014     Radiographic Findings: No results found.  Impression:    The patient appears to be doing fairly well proceeding with postoperative chemotherapy for her colorectal cancer. She remains on believe a good candidate for chemoradiation treatment after a couple of months of chemotherapy.  Plan:  I will coordinate beginning chemoradiation treatment with medical oncology. She began chemotherapy in January and I would plan to proceed with radiation treatment in late March/early April.  I spent 15 minutes with the patient today, the majority of which was spent counseling the patient  on the diagnosis of cancer and coordinating care.   Jodelle Gross, M.D., Ph.D.

## 2015-01-04 ENCOUNTER — Ambulatory Visit (HOSPITAL_BASED_OUTPATIENT_CLINIC_OR_DEPARTMENT_OTHER): Payer: BLUE CROSS/BLUE SHIELD

## 2015-01-04 ENCOUNTER — Other Ambulatory Visit (HOSPITAL_BASED_OUTPATIENT_CLINIC_OR_DEPARTMENT_OTHER): Payer: BLUE CROSS/BLUE SHIELD

## 2015-01-04 ENCOUNTER — Encounter: Payer: Self-pay | Admitting: Hematology

## 2015-01-04 ENCOUNTER — Ambulatory Visit (HOSPITAL_BASED_OUTPATIENT_CLINIC_OR_DEPARTMENT_OTHER): Payer: BLUE CROSS/BLUE SHIELD | Admitting: Hematology

## 2015-01-04 VITALS — BP 126/57 | HR 78 | Temp 97.7°F | Resp 19 | Ht 68.0 in | Wt 208.9 lb

## 2015-01-04 DIAGNOSIS — I2699 Other pulmonary embolism without acute cor pulmonale: Secondary | ICD-10-CM

## 2015-01-04 DIAGNOSIS — C180 Malignant neoplasm of cecum: Secondary | ICD-10-CM

## 2015-01-04 DIAGNOSIS — Z5111 Encounter for antineoplastic chemotherapy: Secondary | ICD-10-CM

## 2015-01-04 DIAGNOSIS — C187 Malignant neoplasm of sigmoid colon: Secondary | ICD-10-CM

## 2015-01-04 DIAGNOSIS — C19 Malignant neoplasm of rectosigmoid junction: Secondary | ICD-10-CM

## 2015-01-04 DIAGNOSIS — C182 Malignant neoplasm of ascending colon: Secondary | ICD-10-CM

## 2015-01-04 DIAGNOSIS — C189 Malignant neoplasm of colon, unspecified: Secondary | ICD-10-CM

## 2015-01-04 DIAGNOSIS — G620 Drug-induced polyneuropathy: Secondary | ICD-10-CM

## 2015-01-04 DIAGNOSIS — D509 Iron deficiency anemia, unspecified: Secondary | ICD-10-CM

## 2015-01-04 DIAGNOSIS — Z95828 Presence of other vascular implants and grafts: Secondary | ICD-10-CM

## 2015-01-04 DIAGNOSIS — Z452 Encounter for adjustment and management of vascular access device: Secondary | ICD-10-CM

## 2015-01-04 LAB — COMPREHENSIVE METABOLIC PANEL (CC13)
ALBUMIN: 3.2 g/dL — AB (ref 3.5–5.0)
ALT: 48 U/L (ref 0–55)
AST: 37 U/L — AB (ref 5–34)
Alkaline Phosphatase: 149 U/L (ref 40–150)
Anion Gap: 8 mEq/L (ref 3–11)
BUN: 11.2 mg/dL (ref 7.0–26.0)
CHLORIDE: 108 meq/L (ref 98–109)
CO2: 25 mEq/L (ref 22–29)
CREATININE: 0.8 mg/dL (ref 0.6–1.1)
Calcium: 8.6 mg/dL (ref 8.4–10.4)
EGFR: 84 mL/min/{1.73_m2} — ABNORMAL LOW (ref >90)
Glucose: 119 mg/dl (ref 70–140)
Potassium: 3.9 mEq/L (ref 3.5–5.1)
Sodium: 141 mEq/L (ref 136–145)
Total Bilirubin: 0.36 mg/dL (ref 0.20–1.20)
Total Protein: 5.9 g/dL — ABNORMAL LOW (ref 6.4–8.3)

## 2015-01-04 LAB — CBC WITH DIFFERENTIAL/PLATELET
BASO%: 1.2 % (ref 0.0–2.0)
BASOS ABS: 0 10*3/uL (ref 0.0–0.1)
EOS ABS: 0.1 10*3/uL (ref 0.0–0.5)
EOS%: 1.6 % (ref 0.0–7.0)
HEMATOCRIT: 30.3 % — AB (ref 34.8–46.6)
HEMOGLOBIN: 9 g/dL — AB (ref 11.6–15.9)
LYMPH#: 1.6 10*3/uL (ref 0.9–3.3)
LYMPH%: 45.1 % (ref 14.0–49.7)
MCH: 23.5 pg — AB (ref 25.1–34.0)
MCHC: 29.7 g/dL — ABNORMAL LOW (ref 31.5–36.0)
MCV: 79 fL — ABNORMAL LOW (ref 79.5–101.0)
MONO#: 0.5 10*3/uL (ref 0.1–0.9)
MONO%: 15.2 % — ABNORMAL HIGH (ref 0.0–14.0)
NEUT%: 36.9 % — AB (ref 38.4–76.8)
NEUTROS ABS: 1.3 10*3/uL — AB (ref 1.5–6.5)
Platelets: 147 10*3/uL (ref 145–400)
RBC: 3.83 10*6/uL (ref 3.70–5.45)
RDW: 27.1 % — ABNORMAL HIGH (ref 11.2–14.5)
WBC: 3.6 10*3/uL — ABNORMAL LOW (ref 3.9–10.3)

## 2015-01-04 LAB — PROTIME-INR
INR: 1.1 — ABNORMAL LOW (ref 2.00–3.50)
Protime: 13.2 Seconds (ref 10.6–13.4)

## 2015-01-04 LAB — CEA: CEA: 1.5 ng/mL (ref 0.0–5.0)

## 2015-01-04 MED ORDER — SODIUM CHLORIDE 0.9 % IV SOLN
2400.0000 mg/m2 | INTRAVENOUS | Status: DC
  Administered 2015-01-04: 5150 mg via INTRAVENOUS
  Filled 2015-01-04: qty 103

## 2015-01-04 MED ORDER — ONDANSETRON 8 MG/NS 50 ML IVPB
INTRAVENOUS | Status: AC
  Filled 2015-01-04: qty 8

## 2015-01-04 MED ORDER — DEXAMETHASONE SODIUM PHOSPHATE 10 MG/ML IJ SOLN
10.0000 mg | Freq: Once | INTRAMUSCULAR | Status: AC
  Administered 2015-01-04: 10 mg via INTRAVENOUS

## 2015-01-04 MED ORDER — OXALIPLATIN CHEMO INJECTION 100 MG/20ML
85.0000 mg/m2 | Freq: Once | INTRAVENOUS | Status: AC
  Administered 2015-01-04: 185 mg via INTRAVENOUS
  Filled 2015-01-04: qty 37

## 2015-01-04 MED ORDER — DEXTROSE 5 % IV SOLN
Freq: Once | INTRAVENOUS | Status: AC
  Administered 2015-01-04: 11:00:00 via INTRAVENOUS

## 2015-01-04 MED ORDER — DEXAMETHASONE SODIUM PHOSPHATE 10 MG/ML IJ SOLN
INTRAMUSCULAR | Status: AC
  Filled 2015-01-04: qty 1

## 2015-01-04 MED ORDER — DEXTROSE 5 % IV SOLN
400.0000 mg/m2 | Freq: Once | INTRAVENOUS | Status: AC
  Administered 2015-01-04: 860 mg via INTRAVENOUS
  Filled 2015-01-04: qty 43

## 2015-01-04 MED ORDER — SODIUM CHLORIDE 0.9 % IJ SOLN
10.0000 mL | INTRAMUSCULAR | Status: DC | PRN
  Administered 2015-01-04: 10 mL via INTRAVENOUS
  Filled 2015-01-04: qty 10

## 2015-01-04 MED ORDER — ONDANSETRON 8 MG/50ML IVPB (CHCC)
8.0000 mg | Freq: Once | INTRAVENOUS | Status: AC
  Administered 2015-01-04: 8 mg via INTRAVENOUS

## 2015-01-04 MED ORDER — FLUOROURACIL CHEMO INJECTION 2.5 GM/50ML
400.0000 mg/m2 | Freq: Once | INTRAVENOUS | Status: AC
  Administered 2015-01-04: 850 mg via INTRAVENOUS
  Filled 2015-01-04: qty 17

## 2015-01-04 MED ORDER — ONDANSETRON HCL 8 MG PO TABS: 8.0000 mg | ORAL_TABLET | Freq: Three times a day (TID) | ORAL | Status: AC | PRN

## 2015-01-04 NOTE — Progress Notes (Signed)
OK to treat today per office note by Dr. Burr Medico with Rote of 1.3

## 2015-01-04 NOTE — Patient Instructions (Signed)
[facility] Discharge Instructions for Patients Receiving Chemotherapy  Today you received the following chemotherapy agents 56fu, oxaliplatin, leucovorin  To help prevent nausea and vomiting after your treatment, we encourage you to take your nausea medication as needed   If you develop nausea and vomiting that is not controlled by your nausea medication, call the clinic.   BELOW ARE SYMPTOMS THAT SHOULD BE REPORTED IMMEDIATELY:  *FEVER GREATER THAN 100.5 F  *CHILLS WITH OR WITHOUT FEVER  NAUSEA AND VOMITING THAT IS NOT CONTROLLED WITH YOUR NAUSEA MEDICATION  *UNUSUAL SHORTNESS OF BREATH  *UNUSUAL BRUISING OR BLEEDING  TENDERNESS IN MOUTH AND THROAT WITH OR WITHOUT PRESENCE OF ULCERS  *URINARY PROBLEMS  *BOWEL PROBLEMS  UNUSUAL RASH Items with * indicate a potential emergency and should be followed up as soon as possible.  Feel free to call the clinic you have any questions or concerns. The clinic phone number is [phone].   Ferumoxytol injection What is this medicine? FERUMOXYTOL is an iron complex. Iron is used to make healthy red blood cells, which carry oxygen and nutrients throughout the body. This medicine is used to treat iron deficiency anemia in people with chronic kidney disease. This medicine may be used for other purposes; ask your health care provider or pharmacist if you have questions. COMMON BRAND NAME(S): Feraheme What should I tell my health care provider before I take this medicine? They need to know if you have any of these conditions: -anemia not caused by low iron levels -high levels of iron in the blood -magnetic resonance imaging (MRI) test scheduled -an unusual or allergic reaction to iron, other medicines, foods, dyes, or preservatives -pregnant or trying to get pregnant -breast-feeding How should I use this medicine? This medicine is for injection into a vein. It is given by a health care professional in a hospital or  clinic setting. Talk to your pediatrician regarding the use of this medicine in children. Special care may be needed. Overdosage: If you think you've taken too much of this medicine contact a poison control center or emergency room at once. Overdosage: If you think you have taken too much of this medicine contact a poison control center or emergency room at once. NOTE: This medicine is only for you. Do not share this medicine with others. What if I miss a dose? It is important not to miss your dose. Call your doctor or health care professional if you are unable to keep an appointment. What may interact with this medicine? This medicine may interact with the following medications: -other iron products This list may not describe all possible interactions. Give your health care provider a list of all the medicines, herbs, non-prescription drugs, or dietary supplements you use. Also tell them if you smoke, drink alcohol, or use illegal drugs. Some items may interact with your medicine. What should I watch for while using this medicine? Visit your doctor or healthcare professional regularly. Tell your doctor or healthcare professional if your symptoms do not start to get better or if they get worse. You may need blood work done while you are taking this medicine. You may need to follow a special diet. Talk to your doctor. Foods that contain iron include: whole grains/cereals, dried fruits, beans, or peas, leafy green vegetables, and organ meats (liver, kidney). What side effects may I notice from receiving this medicine? Side effects that you should report to your doctor or health care professional as soon as possible: -allergic reactions like skin rash,  itching or hives, swelling of the face, lips, or tongue -breathing problems -changes in blood pressure -feeling faint or lightheaded, falls -fever or chills -flushing, sweating, or hot feelings -swelling of the ankles or feet Side effects that  usually do not require medical attention (Report these to your doctor or health care professional if they continue or are bothersome.): -diarrhea -headache -nausea, vomiting -stomach pain This list may not describe all possible side effects. Call your doctor for medical advice about side effects. You may report side effects to FDA at 1-800-FDA-1088. Where should I keep my medicine? This drug is given in a hospital or clinic and will not be stored at home. NOTE: This sheet is a summary. It may not cover all possible information. If you have questions about this medicine, talk to your doctor, pharmacist, or health care provider.  2015, [employer]/Gold Standard. (2012-06-04 15:23:36)

## 2015-01-04 NOTE — Patient Instructions (Signed)

## 2015-01-04 NOTE — Progress Notes (Signed)
[place]  [phone] Fax:([phone]) [place] Note   Patient Care Team: [name], MD as PCP - General (Family Medicine) 01/04/2015  CHIEF COMPLAINTS/PURPOSE OF CONSULTATION:  Synchronized three colon cancer, stage III    Colorectal cancer, stage III   10/04/2014 Tumor Marker CEA 1.0 .  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 mpT4aN2aM0,stage IIIC.    10/10/2014 Surgery partial right hemicolectomy with removal of terminal ileum, sigmoid colectomy, right ureter lysis, (+) residual surgical margin    11/23/2014 -  Chemotherapy FOLFOX every 2 weeks    OTHER ISSUES: 1. Pulmonary embolism in segment omental branches of the right lung, diagnosed on 11/28/2014 2. Right ureter stent placed on 10/20/2014  CURRENT THERAPY: FOLFOX every 2 weeks, started on 11/23/2014, plan for 12 cycles (with radiation in the middle).   HISTORY OF PRESENTING ILLNESS:  Allison Mitchell 54 y.o. female is here for follow-up after her hospital discharge. She was recently diagnosed with 3 synchronous to colon cancer, status post surgical resection. I initially saw her when she was diagnosed in the hospital.  She presented with some GI symptoms since last October 2014 at which time she was diagnosed with ischemic colitis. Due to guaiac-positive stools, patient had undergone an outpatient colonoscopy on 09/26/2014, which showed inflamed and ulcerated mucosa in the terminal ileum, and a 1.5 cm mass in the ascending colon and additional 1.5 cm mass in the mid sigmoid colon. All biopsy from the above 3 sites came back positive for invasive moderate differentiated adenocarcinoma, and the sigmoid mass containing squamous cell differentiation.   She was hospitalized for worsening of her symptoms on  10/03/2014.  A CT of the abdomen and pelvis with contrast on 10/02/2014 was remarkable for soft tissue/bowel wall thickening of the right lower pelvis center at the cecum-terminal ileum involving a portion of the adjacent sigmoid colon. This causes small bowel obstruction and obstructed the distal right ureter causing moderate to severe right hydroureteronephrosis. She was transferred from [facility] to [facility] for surgical management and further testing. CT of the chest with contrast on 10/03/2014 negative for metastatic disease. MRI of the liver without and without contrast on 10/03/2014 is remarkable for small hepatic cysts, but no findings suspicious for hepatic metastatic disease. Right-sided hydroureteronephrosis was once again seen, with early small bowel obstruction bowel gas pattern.  She underwent right hemicolectomy with primary anastomosis with sigmoid colectomy with end colostomy on Friday, Dec 5, after cystoscopy is performed on 12/4 to rule out GU invasion and a ureteral stent placement.  She has family history of colon cancer in her grandmother and polyps in her sister. Denies risk factors for HIV or hepatitis. Denies Diabetes. No Tobacco. No ETOH. Denies prior radiation. SHe did consume significant amount of red meat until recently. We are asked to see the patient in consultation, with recommendations on the oncology standpoint  She is recovering slowly after the surgery. She had the surgical suture opened about 3 weeks ago and was treated for antibiotics , and now much improved. She has no significant pain, but mild abdominal discomfort, no nausea, her stool is getting thicker, she clean her colostomy bag daily. She was seen by Dr. Hulen Skains this morning. She noticed some cloudy urine in the past few days, with mild burning sensation, no fever or back  pain. Her appetite is getting better, eats well.  She lost about 25 lbs. No fever or chills. No other new complains.   INTERIM  HISTORY: [name] returns for follow up and 4th cycle of chemotherapy. She is doing well overall. She still has moderate fatigue, able to function well at home, mild to moderate anorexia, she eats and drinks fairly well, and her weight is stable. No fever or chills. She occasionally has flank pain, for which takes Vicodin.  MEDICAL HISTORY:  Past Medical History  Diagnosis Date  . GERD (gastroesophageal reflux disease)   . Depression   . Colon cancer 10/10/14    colon adenocarcinoma  . Anxiety     SURGICAL HISTORY: Past Surgical History  Procedure Laterality Date  . Cystoscopy with retrograde pyelogram, ureteroscopy and stent placement Right 10/05/2014    Procedure: [place], URETEROSCOPY  AND STENT PLACEMENT;  Surgeon: Alexis Frock, MD;  Location: [facility];  Service: Urology;  Laterality: Right;  . Laparotomy N/A 10/10/2014    Procedure: EXPLORATORY LAPAROTOMY;  Surgeon: Doreen Salvage, MD;  Location: [place];  Service: General;  Laterality: N/A;  . Partial colectomy  10/10/2014    Procedure: PARTIAL SIGMOID COLECTOMY;  Surgeon: Doreen Salvage, MD;  Location: [place];  Service: General;;  . Colostomy  10/10/2014    Procedure: COLOSTOMY;  Surgeon: Doreen Salvage, MD;  Location: [place];  Service: General;;  . Colostomy revision  10/10/2014    Procedure: PARTIAL RIGHT COLECTOMY;  Surgeon: Doreen Salvage, MD;  Location: [place];  Service: General;;  . Ureteral reimplantion Right 10/10/2014    Procedure: OPEN RIGHT URETERAL LYSIS ;  Surgeon: Alexis Frock, MD;  Location: [place];  Service: Urology;  Laterality: Right;  . Portacath placement N/A 10/17/2014    Procedure: INSERTION PORT-A-CATH LEFT SUBCLAVIAN AND MIDLINE INCISION STAPLE REMOVAL ;  Surgeon: Coralie Keens, MD;  Location: [place];  Service: General;  Laterality: N/A;    SOCIAL HISTORY: History   Social History  . Marital Status: Married    Spouse Name: N/A  . Number of Children: N/A  . Years of Education: N/A   Occupational  History  . Not on file.   Social History Main Topics  . Smoking status: Never Smoker   . Smokeless tobacco: Never Used  . Alcohol Use: No  . Drug Use: No  . Sexual Activity: No   Other Topics Concern  . Not on file   Social History Narrative    FAMILY HISTORY: Family History  Problem Relation Age of Onset  . Cancer Mother     skin cancer   . Diabetes Father   . CAD Father     Onset in his 76s, has 7 stents  . Cancer Maternal Grandmother 38    colon cancer   . Cancer Paternal Grandmother 68    breast cancer     ALLERGIES:  is allergic to acyclovir and related and amoxicillin.  MEDICATIONS:  Current Outpatient Prescriptions  Medication Sig Dispense Refill  . citalopram (CELEXA) 20 MG tablet Take 40 mg by mouth daily.  1  . enoxaparin (LOVENOX) 100 MG/ML injection Inject 0.95 mLs (95 mg total) into the skin every 12 (twelve) hours. 20 Syringe 0  . lidocaine-prilocaine (EMLA) cream Apply 1 application topically as needed. 30 g 0  . magnesium hydroxide (MILK OF MAGNESIA) 400 MG/5ML suspension Take 5 mLs by mouth daily as needed for mild constipation.    . Meth-Hyo-M Bl-Na Phos-Ph Sal (URIBEL) 118 MG CAPS  Take 1 capsule by mouth as needed. Every 8 hours as needed  11  . omeprazole (PRILOSEC) 40 MG capsule Take 40 mg by mouth every morning.  12  . ondansetron (ZOFRAN) 8 MG tablet Take 1 tablet (8 mg total) by mouth every 8 (eight) hours as needed for nausea or vomiting. 20 tablet 0  . zolpidem (AMBIEN) 5 MG tablet Take 1 tablet (5 mg total) by mouth at bedtime as needed for sleep. 20 tablet 0  . HYDROcodone-acetaminophen (NORCO/VICODIN) 5-325 MG per tablet Take 1 tablet by mouth every 6 (six) hours as needed for moderate pain. (Patient not taking: Reported on 01/04/2015) 30 tablet 0  . traMADol (ULTRAM) 50 MG tablet Take 1 tablet (50 mg total) by mouth every 6 (six) hours as needed. (Patient not taking: Reported on 01/03/2015) 40 tablet 1  . warfarin (COUMADIN) 5 MG tablet Take 1  tablet (5 mg total) by mouth daily. To start 12/28/14 in evening (Patient not taking: Reported on 01/03/2015) 30 tablet 1   No current facility-administered medications for this visit.   Facility-Administered Medications Ordered in Other Visits  Medication Dose Route Frequency Provider Last Rate Last Dose  . sodium chloride 0.9 % injection 10 mL  10 mL Intravenous PRN Truitt Merle, MD   10 mL at 01/04/15 [id number]    REVIEW OF SYSTEMS:   Constitutional: Denies fevers, chills or abnormal night sweats Eyes: Denies blurriness of vision, double vision or watery eyes Ears, nose, mouth, throat, and face: Denies mucositis or sore throat Respiratory: Denies cough, dyspnea or wheezes Cardiovascular: Denies palpitation, chest discomfort or lower extremity swelling Gastrointestinal:  Denies nausea, heartburn or change in bowel habits Skin: Denies abnormal skin rashes Lymphatics: Denies new lymphadenopathy or easy bruising Neurological:Denies numbness, tingling or new weaknesses Behavioral/Psych: Mood is stable, no new changes  All other systems were reviewed with the patient and are negative.  PHYSICAL EXAMINATION: ECOG PERFORMANCE STATUS: 1 - Symptomatic but completely ambulatory  Filed Vitals:   01/04/15 1001  BP: 126/57  Pulse: 78  Temp: 97.7 F (36.5 C)  Resp: 19   Filed Weights   01/04/15 1001  Weight: 208 lb 14.4 oz (94.756 kg)    GENERAL:alert, no distress and comfortable SKIN: skin color, texture, turgor are normal, no rashes or significant lesions EYES: normal, conjunctiva are pink and non-injected, sclera clear OROPHARYNX:no exudate, no erythema and lips, buccal mucosa, and tongue normal  NECK: supple, thyroid normal size, non-tender, without nodularity LYMPH:  no palpable lymphadenopathy in the cervical, axillary or inguinal LUNGS: clear to auscultation and percussion with normal breathing effort HEART: regular rate & rhythm and no murmurs and no lower extremity  edema ABDOMEN:abdomen soft, non-tender and normal bowel sounds. Positive for colostomy bag on the left, with normal-appearing stool. Surgical wound upper part is well-healed. Musculoskeletal:no cyanosis of digits and no clubbing  PSYCH: alert & oriented x 3 with fluent speech NEURO: no focal motor/sensory deficits  LABORATORY DATA:  CBC Latest Ref Rng 01/04/2015 12/21/2014 12/07/2014  WBC 3.9 - 10.3 10e3/uL 3.6(L) 3.8(L) 3.8(L)  Hemoglobin 11.6 - 15.9 g/dL 9.0(L) 8.5(L) 8.7(L)  Hematocrit 34.8 - 46.6 % 30.3(L) 28.2(L) 29.4(L)  Platelets 145 - 400 10e3/uL 147 153 303    CMP Latest Ref Rng 12/21/2014 12/07/2014 11/29/2014  Glucose 70 - 140 mg/dl 133 115 107(H)  BUN 7.0 - 26.0 mg/dL 14.7 12.6 16  Creatinine 0.6 - 1.1 mg/dL 0.8 0.9 0.84  Sodium 136 - 145 mEq/L 143 142 136  Potassium 3.5 -  5.1 mEq/L 3.6 4.4 3.9  Chloride 96 - 112 mmol/L - - 104  CO2 22 - 29 mEq/L _0 Calcium 8.4 - 10.4 mg/dL 8.6 8.7 8.4  Total Protein 6.4 - 8.3 g/dL 6.0(L) 6.2(L) -  Total Bilirubin 0.20 - 1.20 mg/dL 0.38 0.28 -  Alkaline Phos 40 - 150 U/L 139 139 -  AST 5 - 34 U/L 35(H) 56(H) -  ALT 0 - 55 U/L 47 129(H) -     Surgical path 10/10/2014 1. Colon, segmental resection for tumor, Cecum, terminal ileum, sigmoid colon - MULTIFOCAL INVASIVE ADENOCARCINOMA, MODERATELY DIFFERENTIATED, THE LARGEST FOCUS SPANS 4.0 CM. - ADENOCARCINOMA INVOLVES SEROSA. - LYMPHOVASCULAR INVASION IS IDENTIFIED. - METASTATIC CARCINOMA IN 5 OF 17 LYMPH NODES (5/17). - THE PROXIMAL AND DISTAL SURGICAL RESECTION MARGINS ARE NEGATIVE FOR ADENOCARCINOMA. - SEE ONCOLOGY TABLE BELOW. 2. Soft tissue, biopsy, Right periureteral tissue - ADENOCARCINOMA. 3. Soft tissue, biopsy, Right periureteral tissue - INFLAMED AND DEGENERATING FIBROADIPOSE TISSUE. - THERE IS NO EVIDENCE OF MALIGNANCY. Microscopic Comment 1. COLON AND RECTUM (INCLUDING TRANS-ANAL RESECTION): Specimen: Distal ileum, cecum, proximal ascending colon, and adherent sigmoid  colon. Procedure: Resection. Tumor site: Multiple foci, including ileocecal valve, ascending colon and sigmoid. Specimen integrity: Disrupted. Macroscopic intactness of mesorectum: N/A Macroscopic tumor perforation: Present. Invasive tumor: Maximum size: 4.0 cm. Histologic type(s): Adenocarcinoma. Histologic grade and differentiation: G2: moderately differentiated Type of polyp in which invasive carcinoma arose: Tubulovillous adenomas. Microscopic extension of invasive tumor: Focally involves the serosa. Lymph-Vascular invasion: Present. Peri-neural invasion: Not identified. Tumor deposit(s) (discontinuous extramural extension): Not identified. Resection margins: Proximal margin: 4.0 cm Distal margin: 3.5 cm Circumferential (radial) (posterior ascending, posterior descending; lateral and posterior mid-rectum; and entire lower 1/3 rectum): Cannot be assessed. Mesenteric margin (sigmoid and transverse): Cannot be assessed. Treatment effect (neo-adjuvant therapy): N/A Additional polyp(s): Not identified. Non-neoplastic findings: No significant findings. Lymph nodes: number examined 17; number positive: 5 Pathologic Staging: mpT4a, pN2a, pMX Ancillary studies: A block will be sent for MSI testing by [facility] and MSI testing by PCR and the results reported separately. Comment: There appear to be three synchronous primary tumors in the specimen, one at the ileocecal valve, one in the ascending colon, and one at the sigmoid colon. Each of these foci contain an associated tubulovillous adenoma, suggesting three separate primary sites. The tumor in the sigmoid colon appears to involve the serosa as well. The other two tumors show extension into at least the pericolonic soft tissue. Dr Mali Rund has reviewed selected slides and concurs that there are likely three synchronous tumors here. The case was discussed with Dr Hulen Skains. Per Dr Hulen Skains, the specimen in #2 is a contiguous piece of tissue 2 of  4 Amended copy Amended FINAL for TENILLE, MORRILL [phone]) Microscopic Comment(continued) with the main tumor, and therefore, not considered a distant metastatic focus. Therefore, the tumor is staged as an MX. (JBK:ecj 10/16/2014) JOSHUA  Mismatch Repair (MMR) Protein Immunohistochemistry (IHC) IHC Expression Result: MLH1: Preserved nuclear expression (greater 50% tumor expression) MSH2: Preserved nuclear expression (greater 50% tumor expression) MSH6: Preserved nuclear expression (greater 50% tumor expression) PMS2: Preserved nuclear expression (greater 50% tumor expression) * Internal control demonstrates intact nuclear expression Interpretation: NORMAL There is preserved expression  RADIOGRAPHIC STUDIES: I have personally reviewed the radiological images as listed and agreed with the findings in the report.  CT of abdomen and pelvis 11/14/2014 New complex fluid collection in the right pelvis measuring 2.9 x 5.9cm. Postop abscess cannot be excluded.  Decrease right hydronephrosis  with ureteral stent in appropriate position.  No evidence of abdominal or pelvic metastatic disease.   ASSESSMENT & PLAN:  54 year old Caucasian female with minimal past medical history, who was found to have 3 multifocal colon cancer at terminal ileum/cecum, ascending colon and sigmoid colon. All of them has adenocarcinoma,  And the cecum tumor has squamous differentiation. The cecum tumor has directly invaded the soft tissue along the right ureter, which was not completely resected (1% tumor left over, per Dr. Hulen Skains). She has mpT4aN2aM0 stage IIIB, MSI/MMR normal.   1. Multifocal stage IIIB colon cancer, MMR normal, MSI stable   -I discussed her surgical past findings extensively with patient and her husband. Unfortunately, the tumor was not completely resected. She has very small amount of tumor left over. -Given her residual tumor after surgery, advanced stage III disease, high risk  recurrence in the future, I strongly recommend adjuvant chemotherapy with the standard regimen FOLFOX. Side effects were discussed with patient in great detail. She agrees to proceed. -I'll discuss with his radiation oncologist Dr. Lisbeth Renshaw about the case sequence of chemotherapy and irradiation. Both of [place] agreed to start chemotherapy for a few months first, then start radiation with or without sensitizing chemotherapy 5-FU, , then finish the rest of the adjuvant chemotherapy for total of 6 months. -I encouraged her to continue healthy diet nutrition supplement and being physically active, to recover better. -Her ANC 1.3 today, we'll proceed cycle 4 chemotherapy today with Neulasta on day 3. -I discussed with Dr. Lisbeth Renshaw again today, will hold chemotherapy after this cycle and plan to start radiation at the end of this months. No concurrent chemotherapy with radiation, which I think she may run into tolerance issue.  2. PE -She has been on Lovenox injection for a month, she would like to switch to oral anticoagulant Coumadin. I have called Coumadin 5 mg twice daily today, she'll start today. We'll continue Lovenox until next Monday when she returns for PT/INR checkup. If INR therapeutic, we'll review her Lovenox for additional 5 days. -She is scheduled for ureteral stent exchange next Wednesday. We have contacted her urologist Dr. Johny Shears office, and was told we do not need hold her and coordination for that procedure. -If she is still on Lovenox in addition to committing by next Wednesday, I told her this hold 1 dose of Lovenox on the day of her ureter stent exchange.   3. Right pelvic cyst on CT 11/14/2014 -This is likely a seroma secondary to surgery. Giving her afebrile, normal white count, no abdominal pain or tenderness, this is unlikely a abscess. -I have discussed with her surgeon Dr. Hulen Skains, he agrees with close monitoring.  3. right ureter stent placement -She will follow up with her urologist.  She is scheduled for a stent exchange next week.  5. 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 Her iron study is consistent with iron deficient anemia -This is secondary to the bleeding from her colon cancer. -She received IV Feraheme 540m twice.   6.G1 peripheral neuropathy, fatigue  -Secondary to chemotherapy -Continue close monitoring  Plan, -cycle 4 FOLFOX today -Start Coumadin today, return to coumadin clinic next Monday. Continue Lovenox for now until INR therapeutic. -I'll see her with her first radiation treatment.  Follow-up: I'll see her back when she starts radiation.  All questions were answered. The patient knows to call the clinic with any problems, questions or concerns.  I spent 30 minutes counseling the patient face to face. The total time spent in  the appointment was 35 minutes and more than 50% was on counseling.     Truitt Merle, MD 01/04/2015 10:10 AM

## 2015-01-05 NOTE — Addendum Note (Signed)
Encounter addended by: Rebecca Eaton, RN on: 01/05/2015  9:52 AM<BR>     Documentation filed: Charges VN

## 2015-01-06 ENCOUNTER — Ambulatory Visit (HOSPITAL_BASED_OUTPATIENT_CLINIC_OR_DEPARTMENT_OTHER): Payer: BLUE CROSS/BLUE SHIELD

## 2015-01-06 DIAGNOSIS — C19 Malignant neoplasm of rectosigmoid junction: Secondary | ICD-10-CM

## 2015-01-06 DIAGNOSIS — C187 Malignant neoplasm of sigmoid colon: Secondary | ICD-10-CM

## 2015-01-06 DIAGNOSIS — C182 Malignant neoplasm of ascending colon: Secondary | ICD-10-CM

## 2015-01-06 DIAGNOSIS — C180 Malignant neoplasm of cecum: Secondary | ICD-10-CM

## 2015-01-06 DIAGNOSIS — Z5189 Encounter for other specified aftercare: Secondary | ICD-10-CM

## 2015-01-06 MED ORDER — PEGFILGRASTIM INJECTION 6 MG/0.6ML ~~LOC~~
6.0000 mg | PREFILLED_SYRINGE | Freq: Once | SUBCUTANEOUS | Status: AC
  Administered 2015-01-06: 6 mg via SUBCUTANEOUS

## 2015-01-06 MED ORDER — SODIUM CHLORIDE 0.9 % IJ SOLN
10.0000 mL | INTRAMUSCULAR | Status: DC | PRN
  Administered 2015-01-06: 10 mL
  Filled 2015-01-06: qty 10

## 2015-01-06 MED ORDER — HEPARIN SOD (PORK) LOCK FLUSH 100 UNIT/ML IV SOLN
500.0000 [IU] | Freq: Once | INTRAVENOUS | Status: AC | PRN
Start: 2015-01-06 — End: 2015-01-06
  Administered 2015-01-06: 500 [IU]
  Filled 2015-01-06: qty 5

## 2015-01-06 NOTE — Patient Instructions (Signed)
Pegfilgrastim injection What is this medicine? PEGFILGRASTIM (peg fil GRA stim) is a long-acting granulocyte colony-stimulating factor that stimulates the growth of neutrophils, a type of white blood cell important in the body's fight against infection. It is used to reduce the incidence of fever and infection in patients with certain types of cancer who are receiving chemotherapy that affects the bone marrow. This medicine may be used for other purposes; ask your health care provider or pharmacist if you have questions. COMMON BRAND NAME(S): Neulasta What should I tell my health care provider before I take this medicine? They need to know if you have any of these conditions: -latex allergy -ongoing radiation therapy -sickle cell disease -skin reactions to acrylic adhesives (On-Body Injector only) -an unusual or allergic reaction to pegfilgrastim, filgrastim, other medicines, foods, dyes, or preservatives -pregnant or trying to get pregnant -breast-feeding How should I use this medicine? This medicine is for injection under the skin. If you get this medicine at home, you will be taught how to prepare and give the pre-filled syringe or how to use the On-body Injector. Refer to the patient Instructions for Use for detailed instructions. Use exactly as directed. Take your medicine at regular intervals. Do not take your medicine more often than directed. It is important that you put your used needles and syringes in a special sharps container. Do not put them in a trash can. If you do not have a sharps container, call your pharmacist or healthcare provider to get one. Talk to your pediatrician regarding the use of this medicine in children. Special care may be needed. Overdosage: If you think you have taken too much of this medicine contact a poison control center or emergency room at once. NOTE: This medicine is only for you. Do not share this medicine with others. What if I miss a dose? It is  important not to miss your dose. Call your doctor or health care professional if you miss your dose. If you miss a dose due to an On-body Injector failure or leakage, a new dose should be administered as soon as possible using a single prefilled syringe for manual use. What may interact with this medicine? Interactions have not been studied. Give your health care provider a list of all the medicines, herbs, non-prescription drugs, or dietary supplements you use. Also tell them if you smoke, drink alcohol, or use illegal drugs. Some items may interact with your medicine. This list may not describe all possible interactions. Give your health care provider a list of all the medicines, herbs, non-prescription drugs, or dietary supplements you use. Also tell them if you smoke, drink alcohol, or use illegal drugs. Some items may interact with your medicine. What should I watch for while using this medicine? You may need blood work done while you are taking this medicine. If you are going to need a MRI, CT scan, or other procedure, tell your doctor that you are using this medicine (On-Body Injector only). What side effects may I notice from receiving this medicine? Side effects that you should report to your doctor or health care professional as soon as possible: -allergic reactions like skin rash, itching or hives, swelling of the face, lips, or tongue -dizziness -fever -pain, redness, or irritation at site where injected -pinpoint red spots on the skin -shortness of breath or breathing problems -stomach or side pain, or pain at the shoulder -swelling -tiredness -trouble passing urine Side effects that usually do not require medical attention (report to your doctor   or health care professional if they continue or are bothersome): -bone pain -muscle pain This list may not describe all possible side effects. Call your doctor for medical advice about side effects. You may report side effects to FDA at  1-800-FDA-1088. Where should I keep my medicine? Keep out of the reach of children. Store pre-filled syringes in a refrigerator between 2 and 8 degrees C (36 and 46 degrees F). Do not freeze. Keep in carton to protect from light. Throw away this medicine if it is left out of the refrigerator for more than 48 hours. Throw away any unused medicine after the expiration date. NOTE: This sheet is a summary. It may not cover all possible information. If you have questions about this medicine, talk to your doctor, pharmacist, or health care provider.  2015, [employer]vier/Gold Standard. (2014-01-19 16:14:05)  

## 2015-01-08 ENCOUNTER — Encounter (HOSPITAL_BASED_OUTPATIENT_CLINIC_OR_DEPARTMENT_OTHER): Payer: Self-pay | Admitting: *Deleted

## 2015-01-08 ENCOUNTER — Ambulatory Visit (HOSPITAL_BASED_OUTPATIENT_CLINIC_OR_DEPARTMENT_OTHER): Payer: BLUE CROSS/BLUE SHIELD | Admitting: Pharmacist

## 2015-01-08 ENCOUNTER — Other Ambulatory Visit (HOSPITAL_BASED_OUTPATIENT_CLINIC_OR_DEPARTMENT_OTHER): Payer: BLUE CROSS/BLUE SHIELD

## 2015-01-08 DIAGNOSIS — I2699 Other pulmonary embolism without acute cor pulmonale: Secondary | ICD-10-CM

## 2015-01-08 LAB — PROTIME-INR
INR: 4.5 — ABNORMAL HIGH (ref 2.00–3.50)
Protime: 54 Seconds — ABNORMAL HIGH (ref 10.6–13.4)

## 2015-01-08 LAB — POCT INR: INR: 4.5

## 2015-01-08 NOTE — Progress Notes (Signed)
NPO AFTER MN. ARRIVE AT 0830. CURRENT LAB RESULTS IN CHART AND EPIC. WILL TAKE AM MEDS W/ SIPS OF WATER AND IF NEEDED TAKE PAIN/ NAUSEA RX.

## 2015-01-08 NOTE — Patient Instructions (Signed)
INR above goal Stop lovenox.  No coumadin tonight.  Then Decrease coumadin to 2.5 mg daily (half tablet) except for 5 mg (whole tablet) On Monday/Wednesday/Friday.  Return on 01/15/15 at 1:15pm for lab and 1:30pm for coumadin clinic. Then 2pm with Dr. Lisbeth Renshaw

## 2015-01-08 NOTE — Progress Notes (Signed)
INR above goal today at 4.5 (goal 2-3) Pt seen in coumadin clinic for initial visit: pt diagnosed with PE back on 11/28/14 and has been on lovenox since. Now transitioning to coumadin Pt has only had 4 doses of coumadin (5 mg daily) with quick INR increase Pt was also on Lovenox 95 mg twice daily. This can be stopped now Pt is doing well with no complaints She is glad to be stopping the lovenox shots Ms. Mehlhoff will have a stent replaced on Wednesday 3/9. Per Dr. Tammi Klippel: no need to hold anticoagulation  Counseled patient on diet and vitamin K intake and the importance of being consistent from week to week Pt voiced understanding and stated she does not each "greens" very often but will have a salad every once in a while.  Writing and verbal instructions provided.  She knows to call the coumadin clinic with any issues.  Pt reports no unusual bleeding or bruising No missed or extra doses Plan for now: Stop lovenox. No coumadin tonight.  Then Decrease coumadin to 2.5 mg daily (half tablet) except for 5 mg (whole tablet) On Monday/Wednesday/Friday.  Return on 01/15/15 at 1:15pm for lab and 1:30pm for coumadin clinic. Then 2pm with Dr. Lisbeth Renshaw This new dose may still produce a supratherapeutic INR but due to recent PE ~1 month ago, an INR closer to the upper end of goal is likely more desirable vs. lower end of goal range.

## 2015-01-10 ENCOUNTER — Ambulatory Visit (HOSPITAL_BASED_OUTPATIENT_CLINIC_OR_DEPARTMENT_OTHER)
Admission: RE | Admit: 2015-01-10 | Discharge: 2015-01-10 | Disposition: A | Discharge status: Home / Self Care | Payer: BLUE CROSS/BLUE SHIELD | Source: Ambulatory Visit | Attending: Urology | Admitting: Urology

## 2015-01-10 ENCOUNTER — Ambulatory Visit (HOSPITAL_BASED_OUTPATIENT_CLINIC_OR_DEPARTMENT_OTHER): Payer: BLUE CROSS/BLUE SHIELD | Admitting: Anesthesiology

## 2015-01-10 ENCOUNTER — Encounter (HOSPITAL_BASED_OUTPATIENT_CLINIC_OR_DEPARTMENT_OTHER)
Admission: RE | Disposition: A | Discharge status: Home / Self Care | Payer: Self-pay | Source: Ambulatory Visit | Attending: Urology

## 2015-01-10 ENCOUNTER — Ambulatory Visit: Payer: BLUE CROSS/BLUE SHIELD | Admitting: Radiation Oncology

## 2015-01-10 ENCOUNTER — Encounter (HOSPITAL_BASED_OUTPATIENT_CLINIC_OR_DEPARTMENT_OTHER): Payer: Self-pay | Admitting: *Deleted

## 2015-01-10 DIAGNOSIS — N1339 Other hydronephrosis: Secondary | ICD-10-CM | POA: Diagnosis present

## 2015-01-10 DIAGNOSIS — G629 Polyneuropathy, unspecified: Secondary | ICD-10-CM | POA: Insufficient documentation

## 2015-01-10 DIAGNOSIS — C19 Malignant neoplasm of rectosigmoid junction: Secondary | ICD-10-CM

## 2015-01-10 DIAGNOSIS — Z6831 Body mass index (BMI) 31.0-31.9, adult: Secondary | ICD-10-CM | POA: Diagnosis not present

## 2015-01-10 DIAGNOSIS — C189 Malignant neoplasm of colon, unspecified: Secondary | ICD-10-CM | POA: Diagnosis not present

## 2015-01-10 DIAGNOSIS — K219 Gastro-esophageal reflux disease without esophagitis: Secondary | ICD-10-CM | POA: Insufficient documentation

## 2015-01-10 DIAGNOSIS — Z7901 Long term (current) use of anticoagulants: Secondary | ICD-10-CM | POA: Insufficient documentation

## 2015-01-10 DIAGNOSIS — Z9049 Acquired absence of other specified parts of digestive tract: Secondary | ICD-10-CM | POA: Diagnosis not present

## 2015-01-10 DIAGNOSIS — Z87891 Personal history of nicotine dependence: Secondary | ICD-10-CM | POA: Diagnosis not present

## 2015-01-10 DIAGNOSIS — Z86711 Personal history of pulmonary embolism: Secondary | ICD-10-CM | POA: Insufficient documentation

## 2015-01-10 HISTORY — DX: Personal history of pulmonary embolism: Z86.711

## 2015-01-10 HISTORY — PX: CYSTOSCOPY W/ RETROGRADES: SHX1426

## 2015-01-10 HISTORY — DX: Adverse effect of antineoplastic and immunosuppressive drugs, initial encounter: T45.1X5A

## 2015-01-10 HISTORY — DX: Long term (current) use of anticoagulants: Z79.01

## 2015-01-10 HISTORY — DX: Unspecified hydronephrosis: N13.30

## 2015-01-10 HISTORY — DX: Dysuria: R30.0

## 2015-01-10 HISTORY — PX: CYSTOSCOPY W/ URETERAL STENT PLACEMENT: SHX1429

## 2015-01-10 HISTORY — DX: Malignant neoplasm of rectosigmoid junction: C19

## 2015-01-10 HISTORY — DX: Drug-induced polyneuropathy: G62.0

## 2015-01-10 HISTORY — DX: Adverse effect of antineoplastic and immunosuppressive drugs, initial encounter: D64.81

## 2015-01-10 LAB — POCT I-STAT, CHEM 8
BUN: 17 mg/dL (ref 6–23)
CHLORIDE: 106 mmol/L (ref 96–112)
CREATININE: 0.7 mg/dL (ref 0.50–1.10)
Calcium, Ion: 1.19 mmol/L (ref 1.12–1.23)
Glucose, Bld: 96 mg/dL (ref 70–99)
HCT: 32 % — ABNORMAL LOW (ref 36.0–46.0)
Hemoglobin: 10.9 g/dL — ABNORMAL LOW (ref 12.0–15.0)
POTASSIUM: 4 mmol/L (ref 3.5–5.1)
SODIUM: 139 mmol/L (ref 135–145)
TCO2: 20 mmol/L (ref 0–100)

## 2015-01-10 SURGERY — CYSTOSCOPY, WITH RETROGRADE PYELOGRAM
Anesthesia: General | Site: Ureter | Laterality: Right

## 2015-01-10 MED ORDER — PROPOFOL 10 MG/ML IV BOLUS
INTRAVENOUS | Status: DC | PRN
  Administered 2015-01-10: 200 mg via INTRAVENOUS

## 2015-01-10 MED ORDER — IOHEXOL 350 MG/ML SOLN
INTRAVENOUS | Status: DC | PRN
  Administered 2015-01-10: 10 mL

## 2015-01-10 MED ORDER — LIDOCAINE HCL (CARDIAC) 20 MG/ML IV SOLN
INTRAVENOUS | Status: DC | PRN
  Administered 2015-01-10: 10 mg via INTRAVENOUS

## 2015-01-10 MED ORDER — DEXAMETHASONE SODIUM PHOSPHATE 4 MG/ML IJ SOLN
INTRAMUSCULAR | Status: DC | PRN
  Administered 2015-01-10: 10 mg via INTRAVENOUS

## 2015-01-10 MED ORDER — TRAMADOL HCL 50 MG PO TABS: 50.0000 mg | ORAL_TABLET | Freq: Four times a day (QID) | ORAL | Status: DC | PRN

## 2015-01-10 MED ORDER — ONDANSETRON HCL 4 MG/2ML IJ SOLN
INTRAMUSCULAR | Status: DC | PRN
  Administered 2015-01-10: 4 mg via INTRAVENOUS

## 2015-01-10 MED ORDER — FENTANYL CITRATE 0.05 MG/ML IJ SOLN
INTRAMUSCULAR | Status: AC
  Filled 2015-01-10: qty 4

## 2015-01-10 MED ORDER — PROMETHAZINE HCL 25 MG/ML IJ SOLN
6.2500 mg | INTRAMUSCULAR | Status: DC | PRN
  Filled 2015-01-10: qty 1

## 2015-01-10 MED ORDER — ACETAMINOPHEN 10 MG/ML IV SOLN
INTRAVENOUS | Status: DC | PRN
  Administered 2015-01-10: 1000 mg via INTRAVENOUS

## 2015-01-10 MED ORDER — FENTANYL CITRATE 0.05 MG/ML IJ SOLN
INTRAMUSCULAR | Status: DC | PRN
  Administered 2015-01-10: 50 ug via INTRAVENOUS

## 2015-01-10 MED ORDER — MIDAZOLAM HCL 5 MG/5ML IJ SOLN
INTRAMUSCULAR | Status: DC | PRN
  Administered 2015-01-10: 2 mg via INTRAVENOUS

## 2015-01-10 MED ORDER — MIDAZOLAM HCL 2 MG/2ML IJ SOLN
INTRAMUSCULAR | Status: AC
  Filled 2015-01-10: qty 2

## 2015-01-10 MED ORDER — FENTANYL CITRATE 0.05 MG/ML IJ SOLN
25.0000 ug | INTRAMUSCULAR | Status: DC | PRN
  Filled 2015-01-10: qty 1

## 2015-01-10 MED ORDER — GENTAMICIN IN SALINE 1.6-0.9 MG/ML-% IV SOLN
80.0000 mg | INTRAVENOUS | Status: DC
  Filled 2015-01-10: qty 50

## 2015-01-10 MED ORDER — LACTATED RINGERS IV SOLN
INTRAVENOUS | Status: DC
  Administered 2015-01-10: 09:00:00 via INTRAVENOUS
  Filled 2015-01-10: qty 1000

## 2015-01-10 MED ORDER — GENTAMICIN SULFATE 40 MG/ML IJ SOLN
5.0000 mg/kg | Freq: Once | INTRAVENOUS | Status: AC
  Administered 2015-01-10: 380 mg via INTRAVENOUS
  Filled 2015-01-10: qty 9.5

## 2015-01-10 MED ORDER — LACTATED RINGERS IV SOLN
INTRAVENOUS | Status: DC
  Filled 2015-01-10: qty 1000

## 2015-01-10 MED ORDER — MEPERIDINE HCL 25 MG/ML IJ SOLN
6.2500 mg | INTRAMUSCULAR | Status: DC | PRN
  Filled 2015-01-10: qty 1

## 2015-01-10 SURGICAL SUPPLY — 22 items
BAG URO CATCHER STRL LF (DRAPE) ×4 IMPLANT
BASKET LASER NITINOL 1.9FR (BASKET) IMPLANT
BASKET ZERO TIP NITINOL 2.4FR (BASKET) IMPLANT
CATH INTERMIT  6FR 70CM (CATHETERS) ×4 IMPLANT
CLOTH BEACON ORANGE TIMEOUT ST (SAFETY) ×4 IMPLANT
GLOVE BIO SURGEON STRL SZ7.5 (GLOVE) ×4 IMPLANT
GLOVE BIOGEL M 6.5 STRL (GLOVE) ×4 IMPLANT
GLOVE BIOGEL PI IND STRL 6.5 (GLOVE) ×4 IMPLANT
GLOVE BIOGEL PI INDICATOR 6.5 (GLOVE) ×4
GOWN PREVENTION PLUS XLARGE (GOWN DISPOSABLE) IMPLANT
GOWN STRL NON-REIN LRG LVL3 (GOWN DISPOSABLE) IMPLANT
GOWN STRL REUS W/TWL LRG LVL3 (GOWN DISPOSABLE) ×4 IMPLANT
GOWN STRL REUS W/TWL XL LVL3 (GOWN DISPOSABLE) ×4 IMPLANT
GUIDEWIRE ANG ZIPWIRE 038X150 (WIRE) IMPLANT
GUIDEWIRE STR DUAL SENSOR (WIRE) ×4 IMPLANT
IV NS 1000ML (IV SOLUTION) ×2
IV NS 1000ML BAXH (IV SOLUTION) ×2 IMPLANT
IV NS IRRIG 3000ML ARTHROMATIC (IV SOLUTION) IMPLANT
PACK CYSTO (CUSTOM PROCEDURE TRAY) ×4 IMPLANT
STENT CONTOUR 7FRX24 (STENTS) ×4 IMPLANT
SYRINGE 10CC LL (SYRINGE) IMPLANT
TUBE FEEDING 8FR 16IN STR KANG (MISCELLANEOUS) IMPLANT

## 2015-01-10 NOTE — Anesthesia Procedure Notes (Signed)
Procedure Name: LMA Insertion Date/Time: 01/10/2015 9:56 AM Performed by: Bethena Roys T Pre-anesthesia Checklist: Patient identified, Emergency Drugs available, Suction available and Patient being monitored Patient Re-evaluated:Patient Re-evaluated prior to inductionOxygen Delivery Method: Circle System Utilized Preoxygenation: Pre-oxygenation with 100% oxygen Intubation Type: IV induction Ventilation: Mask ventilation without difficulty LMA: LMA inserted LMA Size: 4.0 Number of attempts: 1 Airway Equipment and Method: Bite block Placement Confirmation: positive ETCO2 Tube secured with: Tape Dental Injury: Teeth and Oropharynx as per pre-operative assessment

## 2015-01-10 NOTE — Anesthesia Postprocedure Evaluation (Signed)
  Anesthesia Post-op Note  Patient: Allison Mitchell  Procedure(s) Performed: Procedure(s) (LRB): CYSTOSCOPY WITH RETROGRADE PYELOGRAM (Right) CYSTOSCOPY WITH STENT REPLACEMENT (Right)  Patient Location: PACU  Anesthesia Type: General  Level of Consciousness: awake and alert   Airway and Oxygen Therapy: Patient Spontanous Breathing  Post-op Pain: mild  Post-op Assessment: Post-op Vital signs reviewed, Patient's Cardiovascular Status Stable, Respiratory Function Stable, Patent Airway and No signs of Nausea or vomiting  Last Vitals:  Filed Vitals:   01/10/15 1100  BP: 128/76  Pulse: 79  Temp:   Resp: 13    Post-op Vital Signs: stable   Complications: No apparent anesthesia complications

## 2015-01-10 NOTE — Discharge Instructions (Signed)
1 - You may have urinary urgency (bladder spasms) and bloody urine on / off with stent in place. This is normal.  2 - Call MD or go to ER for fever >102, severe pain / nausea / vomiting not relieved by medications, or acute change in medical status    CYSTOSCOPY HOME CARE INSTRUCTIONS  Activity: Rest for the remainder of the day.  Do not drive or operate equipment today.  You may resume normal activities in one to two days as instructed by your physician.   Meals: Drink plenty of liquids and eat light foods such as gelatin or soup this evening.  You may return to a normal meal plan tomorrow.  Return to Work: You may return to work in one to two days or as instructed by your physician.  Special Instructions / Symptoms: Call your physician if any of these symptoms occur:   -persistent or heavy bleeding  -bleeding which continues after first few urination  -large blood clots that are difficult to pass  -urine stream diminishes or stops completely  -fever equal to or higher than 101 degrees Farenheit.  -cloudy urine with a strong, foul odor  -severe pain  Females should always wipe from front to back after elimination.  You may feel some burning pain when you urinate.  This should disappear with time.  Applying moist heat to the lower abdomen or a hot tub bath may help relieve the pain. \     Post Anesthesia Home Care Instructions  Activity: Get plenty of rest for the remainder of the day. A responsible adult should stay with you for 24 hours following the procedure.  For the next 24 hours, DO NOT: -Drive a car -Paediatric nurse -Drink alcoholic beverages -Take any medication unless instructed by your physician -Make any legal decisions or sign important papers.  Meals: Start with liquid foods such as gelatin or soup. Progress to regular foods as tolerated. Avoid greasy, spicy, heavy foods. If nausea and/or vomiting occur, drink only clear liquids until the nausea and/or  vomiting subsides. Call your physician if vomiting continues.  Special Instructions/Symptoms: Your throat may feel dry or sore from the anesthesia or the breathing tube placed in your throat during surgery. If this causes discomfort, gargle with warm salt water. The discomfort should disappear within 24 hours.

## 2015-01-10 NOTE — Brief Op Note (Signed)
01/10/2015  10:08 AM  PATIENT:  Allison Mitchell  54 y.o. female  PRE-OPERATIVE DIAGNOSIS:  RIGHT MALIGNANT STENT CHANGE  POST-OPERATIVE DIAGNOSIS:  RIGHT MALIGNANT STENT CHANGE  PROCEDURE:  Procedure(s): CYSTOSCOPY WITH RETROGRADE PYELOGRAM (Right) CYSTOSCOPY WITH STENT REPLACEMENT (Right)  SURGEON:  Surgeon(s) and Role:    * Alexis Frock, MD - Primary  PHYSICIAN ASSISTANT:   ASSISTANTS: none   ANESTHESIA:   general  EBL:  Total I/O In: 200 [I.V.:200] Out: -   BLOOD ADMINISTERED:none  DRAINS: none   LOCAL MEDICATIONS USED:  NONE  SPECIMEN:  No Specimen  DISPOSITION OF SPECIMEN:  N/A  COUNTS:  YES  TOURNIQUET:  * No tourniquets in log *  DICTATION: .Other Dictation: Dictation Number [id number]  PLAN OF CARE: Discharge to home after PACU  PATIENT DISPOSITION:  PACU - hemodynamically stable.   Delay start of Pharmacological VTE agent (>24hrs) due to surgical blood loss or risk of bleeding: not applicable

## 2015-01-10 NOTE — Anesthesia Preprocedure Evaluation (Signed)
Anesthesia Evaluation  Patient identified by MRN, date of birth, ID band Patient awake    Reviewed: Allergy & Precautions, H&P , NPO status , Patient's Chart, lab work & pertinent test results  History of Anesthesia Complications Negative for: history of anesthetic complications  Airway Mallampati: II  TM Distance: >3 FB Neck ROM: Full    Dental  (+) Dental Advisory Given, Teeth Intact   Pulmonary former smoker,  breath sounds clear to auscultation        Cardiovascular - anginanegative cardio ROS  Rhythm:Regular Rate:Normal     Neuro/Psych negative neurological ROS     GI/Hepatic Neg liver ROS, GERD-  Medicated,Small bowel cancer   Endo/Other  Morbid obesity  Renal/GU ureteral obstruction     Musculoskeletal   Abdominal   Peds  Hematology  (+) Blood dyscrasia (Hb 8,7), ,   Anesthesia Other Findings   Reproductive/Obstetrics                             Anesthesia Physical  Anesthesia Plan  ASA: III  Anesthesia Plan: General LMA and General   Post-op Pain Management:    Induction: Intravenous  Airway Management Planned: LMA  Additional Equipment:   Intra-op Plan:   Post-operative Plan: Extubation in OR  Informed Consent: I have reviewed the patients History and Physical, chart, labs and discussed the procedure including the risks, benefits and alternatives for the proposed anesthesia with the patient or authorized representative who has indicated his/her understanding and acceptance.   Dental advisory given  Plan Discussed with: CRNA  Anesthesia Plan Comments:         Anesthesia Quick Evaluation

## 2015-01-10 NOTE — Transfer of Care (Signed)
Immediate Anesthesia Transfer of Care Note  Patient: Allison Mitchell  Procedure(s) Performed: Procedure(s): CYSTOSCOPY WITH RETROGRADE PYELOGRAM (Right) CYSTOSCOPY WITH STENT REPLACEMENT (Right)  Patient Location: PACU  Anesthesia Type:General  Level of Consciousness: awake, alert  and oriented  Airway & Oxygen Therapy: Patient Spontanous Breathing and Patient connected to nasal cannula oxygen  Post-op Assessment: Report given to RN  Post vital signs: Reviewed and stable  Last Vitals:  Filed Vitals:   01/10/15 0838  BP: 121/51  Pulse: 87  Temp: 36.9 C  Resp: 16    Complications: No apparent anesthesia complications

## 2015-01-10 NOTE — H&P (Signed)
Allison Mitchell is an 54 y.o. female.    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  Recent Course -  Cr 0.8 12/2014, stent in good positoin by CT 11/2014.  2 - Metastatic Colon Cancer - pT4N2Mx multifocal adenocarcinoma s/p surgerical resection in combined gen surg / urology procedure 10/2014. Currently undergoing FOLFOX chemo as part of chemo-radiation plan coordinated by Dr. Burr Medico.  Today "Allison Mitchell" is seen to proceed with right ureteral stent change to maximize renal function as she is still in midst of chemo for her advanced colon cancer.   Past Medical History  Diagnosis Date  . GERD (gastroesophageal reflux disease)   . Depression   . Anxiety   . 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 Peripheral neuropathy due to chemotherapy   . Hydronephrosis, right     malignant--  s/p reimiplant right ureter  . History of pulmonary embolus (PE)     11-28-2014--  due to chemotherapy on coumadin  . Anticoagulated on Coumadin   . Colorectal cancer, stage III oncologist--  dr Stefani Dama    Dx  10-10-2014---Stage IIIC, [id number], Multifocal synchronous, Moderate differentiated Invasive, Mets to nodes (5 out of 17)/  s/p  Resection tumor cecum, terminal ileum, sigmoid/  currently chemoradiation therapy  . Dysuria     Past Surgical History  Procedure Laterality Date  . Cystoscopy with retrograde pyelogram, ureteroscopy and stent placement Right 10/05/2014    Procedure: [place], URETEROSCOPY  AND STENT PLACEMENT;  Surgeon: Alexis Frock, MD;  Location: [facility];  Service: Urology;  Laterality: Right;  . Laparotomy N/A 10/10/2014    Procedure: EXPLORATORY LAPAROTOMY;  Surgeon: Doreen Salvage, MD;  Location: [facility];  Service: General;   Laterality: N/A;  . Partial colectomy  10/10/2014    Procedure: PARTIAL SIGMOID COLECTOMY;  Surgeon: Doreen Salvage, MD;  Location: [facility];  Service: General;;  . Colostomy  10/10/2014    Procedure: COLOSTOMY;  Surgeon: Doreen Salvage, MD;  Location: [facility];  Service: General;;  . Colostomy revision  10/10/2014    Procedure: PARTIAL RIGHT COLECTOMY;  Surgeon: Doreen Salvage, MD;  Location: [facility];  Service: General;;  . Ureteral reimplantion Right 10/10/2014    Procedure: OPEN RIGHT URETERAL LYSIS ;  Surgeon: Alexis Frock, MD;  Location: [facility];  Service: Urology;  Laterality: Right;  . Portacath placement N/A 10/17/2014    Procedure: INSERTION PORT-A-CATH LEFT SUBCLAVIAN AND MIDLINE INCISION STAPLE REMOVAL ;  Surgeon: Coralie Keens, MD;  Location: [facility];  Service: General;  Laterality: N/A;    Family History  Problem Relation Age of Onset  . Cancer Mother     skin cancer   . Diabetes Father   . CAD Father     Onset in his 2s, has 7 stents  . Cancer Maternal Grandmother 63    colon cancer   . Cancer Paternal Grandmother 45    breast cancer    Social History:  reports that she has never smoked. She has never used smokeless tobacco. She reports that she does not drink alcohol or use illicit drugs.  Allergies:  Allergies  Allergen Reactions  . Amoxicillin Other (See Comments)    Yeast infection    No prescriptions prior to admission    Results for orders  placed or performed in visit on 01/08/15 (from the past 48 hour(s))  POCT INR     Status: None   Collection Time: 01/08/15  9:00 AM  Result Value Ref Range   INR 4.5    No results found.  Review of Systems  Constitutional: Negative.  Negative for fever and chills.  HENT: Negative.   Eyes: Negative.   Respiratory: Negative.   Cardiovascular: Negative.   Gastrointestinal: Negative.  Negative for nausea and vomiting.  Genitourinary: Negative.   Musculoskeletal: Negative.   Skin: Negative.   Neurological: Negative.    Endo/Heme/Allergies: Negative.   Psychiatric/Behavioral: Negative.     There were no vitals taken for this visit. Physical Exam  Constitutional: She appears well-developed.  HENT:  Head: Normocephalic.  Eyes: Pupils are equal, round, and reactive to light.  Neck: Normal range of motion.  Cardiovascular: Normal rate.   Respiratory: Effort normal.  GI: Soft.  Colostomy c/d/i  Genitourinary:  No CVAT  Musculoskeletal: Normal range of motion.  Neurological: She is alert.  Skin: Skin is warm.  Psychiatric: She has a normal mood and affect. Her behavior is normal. Judgment and thought content normal.     Assessment/Plan  1 - Right Malignant Hydronephrosis - rec proceed with Q62mo stent changes until completed chemo-radiation to maximize renal function and help prevent sricture formation.   Risks including bleeding, infection, stent colic, stent migration / erosion as well as rare risks such as DVT,PE,MI,CVA,Mortality discussed.  2 - Metastatic Colon Cancer - agree with aggressive post-surgical therapy as young and vigorous.  Allison Mitchell 01/10/2015, 6:11 AM

## 2015-01-11 ENCOUNTER — Encounter (HOSPITAL_BASED_OUTPATIENT_CLINIC_OR_DEPARTMENT_OTHER): Payer: Self-pay | Admitting: Urology

## 2015-01-11 NOTE — Op Note (Signed)
NAMELUVERNA, [name]:  [id number]  MEDICAL RECORD NO.:  [id number]  LOCATION:                                 FACILITY:  PHYSICIAN:  Alexis Frock, MD     DATE OF BIRTH:  [birth year]  DATE OF PROCEDURE:  01/10/2015                               OPERATIVE REPORT   DIAGNOSIS:  Right malignant hydronephrosis, history of locally advanced metastatic colon cancer.  PROCEDURE: 1. Cystoscopy with right retrograde pyelogram interpretation. 2. Right ureteral stent change 7 x 24 Contour, no tether.  FINDINGS: 1. Unremarkable urinary bladder. 2. No filling defects, narrowing or hydronephrosis whatsoever on right     retrograde pyelography. 3. Stent, new 7 x 24 Contour, no tether.  INDICATIONS:  Ms. Urquidi is a very pleasant, but unfortunate 54 year old lady with history of metastatic colon cancer status post local resection with ureterolysis for correct involvement and encasement of a right ureter from locally advanced colon cancer.  This was performed in December 2015.  She has since undergone and is undergoing continue at this point, chemoradiation for her disease in order to optimize renal function and help prevent permanent stricturing of her right distal ureter.  It was felt that continued ureteral stenting would clearly be beneficial.  She is in the midst of treatment and had at least several months more to go of this.  Recurrent stent is over 61 months old and felt that she was due for stent change.  Informed consent was obtained, placed in medical record.  PROCEDURE IN DETAIL:  Patient being Allison Mitchell, procedure being right ureteral stent change was confirmed.  Procedure was carried out. Time-out was performed.  Intravenous antibiotics were administered. General LMA anesthesia was introduced.  Patient placed into a low lithotomy position and sterile field was created by prepping and draping the patient's vagina, introitus, and proximal thighs  using iodine x3. Next, cystourethroscopy was performed using a 22-French rigid cystoscope with 12-degree offset lens.  Inspection of urinary bladder revealed no diverticula, calcifications, papular lesions.  The distal and right ureteral stent was grasped brought to the level of urethral meatus.  A 0.038 Glidewire was advanced at the level of the upper pole and set aside as a safety wire and a stent was exchanged for a 6-French end-hole catheter and right retrograde pyelogram was obtained.  Right retrograde pyelogram demonstrated a single right ureter, single system right kidney.  There were no filling defects or narrowing whatsoever.  The distal ureter was carefully inspected and there were no obvious points of narrowing there, this suggested continued patency of the ureter status post prior ureterolysis.  The stent appeared to be minimally encrusted as per above given the patient is still in the midst of chemoradiation with known tumor, very near her distal right ureter, it was felt that exchange of the stent was clearly warranted as such a new 7 x 24 Contour- type stent was placed for remaining safety wire.  Good proximal and distal deployment were noted.  Bladder was emptied per cystoscope. Procedure was then terminated.  The patient tolerated procedure well. There were no immediate periprocedural complications.  Patient was taken to postanesthesia care unit in stable condition.  ______________________________ Alexis Frock, MD     TM/MEDQ  D:  01/10/2015  T:  01/11/2015  Job:  [id number]

## 2015-01-15 ENCOUNTER — Ambulatory Visit
Admission: RE | Admit: 2015-01-15 | Discharge: 2015-01-15 | Disposition: A | Discharge status: Home / Self Care | Payer: BLUE CROSS/BLUE SHIELD | Source: Ambulatory Visit | Attending: Radiation Oncology | Admitting: Radiation Oncology

## 2015-01-15 ENCOUNTER — Other Ambulatory Visit (HOSPITAL_BASED_OUTPATIENT_CLINIC_OR_DEPARTMENT_OTHER): Payer: BLUE CROSS/BLUE SHIELD

## 2015-01-15 ENCOUNTER — Ambulatory Visit (HOSPITAL_BASED_OUTPATIENT_CLINIC_OR_DEPARTMENT_OTHER): Payer: Self-pay | Admitting: Pharmacist

## 2015-01-15 DIAGNOSIS — C19 Malignant neoplasm of rectosigmoid junction: Secondary | ICD-10-CM | POA: Diagnosis not present

## 2015-01-15 DIAGNOSIS — I2699 Other pulmonary embolism without acute cor pulmonale: Secondary | ICD-10-CM

## 2015-01-15 LAB — PROTIME-INR
INR: 3.7 — AB (ref 2.00–3.50)
Protime: 44.4 Seconds — ABNORMAL HIGH (ref 10.6–13.4)

## 2015-01-15 LAB — POCT INR: INR: 3.7

## 2015-01-15 NOTE — Patient Instructions (Addendum)
INR just above goal Decrease coumadin to 2.5 mg daily (half tablet) except for 5 mg (whole tablet) Wednesday.  Return on 01/24/15 at 1:30pm for lab and 1:45pm for coumadin clinic followed by radiation  Call [phone] if appointment needs to be changed

## 2015-01-15 NOTE — Progress Notes (Signed)
INR just above goal at 3.7 (goal 2-3) Pt is doing well with no complaints She will be starting radiation likely next week Pt took coumadin as instructed last week No unusual bleeding or bruising No missed or extra doses No diet or medication changes Plans: Decrease coumadin to 2.5 mg daily (half tablet) except for 5 mg (whole tablet) Wednesday.  Return on 01/24/15 at 1:30pm for lab and 1:45pm for coumadin clinic followed by radiation

## 2015-01-17 NOTE — Progress Notes (Signed)
  Radiation Oncology         [phone] ________________________________  Name: Allison Mitchell MRN: [id number]  Date: 01/15/2015  DOB: 1961/07/29  SIMULATION AND TREATMENT PLANNING NOTE  DIAGNOSIS:  Colon cancer  Site:  Right lower abdomen/pelvis  NARRATIVE:  The patient was brought to the [facility].  Identity was confirmed.  All relevant records and images related to the planned course of therapy were reviewed.   Written consent to proceed with treatment was confirmed which was freely given after reviewing the details related to the planned course of therapy had been reviewed with the patient.  Then, the patient was set-up in a stable reproducible  supine position for radiation therapy.  CT images were obtained.  Surface markings were placed.    Medically necessary complex treatment device(s) for immobilization:  Customized vac lock bag.   The CT images were loaded into the planning software.  Then the target and avoidance structures were contoured.  Treatment planning then occurred.  The radiation prescription was entered and confirmed.  A total of 4 complex treatment devices were fabricated which relate to the designed radiation treatment fields. Each of these customized fields/ complex treatment devices will be used on a daily basis during the radiation course. I have requested : 3D Simulation  I have requested a DVH of the following structures: Target volume, left kidney, right kidney, bladder, bowel.   PLAN:  The patient will receive 45 Gy in 15 fractions. It is anticipated that the patient will then receive a 5.4 to 9 gray boost subsequently.  ________________________________   Jodelle Gross, MD, PhD

## 2015-01-18 DIAGNOSIS — C19 Malignant neoplasm of rectosigmoid junction: Secondary | ICD-10-CM | POA: Diagnosis not present

## 2015-01-22 ENCOUNTER — Ambulatory Visit
Admission: RE | Admit: 2015-01-22 | Discharge: 2015-01-22 | Disposition: A | Discharge status: Home / Self Care | Payer: BLUE CROSS/BLUE SHIELD | Source: Ambulatory Visit | Attending: Radiation Oncology | Admitting: Radiation Oncology

## 2015-01-22 DIAGNOSIS — C19 Malignant neoplasm of rectosigmoid junction: Secondary | ICD-10-CM | POA: Diagnosis not present

## 2015-01-23 ENCOUNTER — Ambulatory Visit
Admission: RE | Admit: 2015-01-23 | Discharge: 2015-01-23 | Disposition: A | Discharge status: Home / Self Care | Payer: BLUE CROSS/BLUE SHIELD | Source: Ambulatory Visit | Attending: Radiation Oncology | Admitting: Radiation Oncology

## 2015-01-23 DIAGNOSIS — C19 Malignant neoplasm of rectosigmoid junction: Secondary | ICD-10-CM | POA: Diagnosis not present

## 2015-01-23 MED ORDER — RADIAPLEXRX EX GEL
Freq: Once | CUTANEOUS | Status: AC
  Administered 2015-01-23: 09:00:00 via TOPICAL

## 2015-01-23 NOTE — Progress Notes (Signed)
Pt education abdomen, radiaplex gel, my business card and radiation therapy and you book, discussed ways to manage side effects, nausea,vomiting, diarrhea, fatigue,skin irritation,pain, may need to go to 5-6 smaller meals throughout the day ,bland diet if nausea, increase protein in diet, drink plenty water,stay hydrated, take zofran prn nausea, may need imodium ad for diarrhea, go on low fiber diet if having diarrhea, no fresh fruits or vegetable, all questions answered, teach back given 8:56 AM

## 2015-01-24 ENCOUNTER — Ambulatory Visit: Payer: BLUE CROSS/BLUE SHIELD | Admitting: Pharmacist

## 2015-01-24 ENCOUNTER — Other Ambulatory Visit (HOSPITAL_BASED_OUTPATIENT_CLINIC_OR_DEPARTMENT_OTHER): Payer: BLUE CROSS/BLUE SHIELD

## 2015-01-24 ENCOUNTER — Ambulatory Visit
Admission: RE | Admit: 2015-01-24 | Discharge: 2015-01-24 | Disposition: A | Discharge status: Home / Self Care | Payer: BLUE CROSS/BLUE SHIELD | Source: Ambulatory Visit | Attending: Radiation Oncology | Admitting: Radiation Oncology

## 2015-01-24 DIAGNOSIS — I2699 Other pulmonary embolism without acute cor pulmonale: Secondary | ICD-10-CM

## 2015-01-24 DIAGNOSIS — C19 Malignant neoplasm of rectosigmoid junction: Secondary | ICD-10-CM | POA: Diagnosis not present

## 2015-01-24 DIAGNOSIS — C180 Malignant neoplasm of cecum: Secondary | ICD-10-CM | POA: Diagnosis not present

## 2015-01-24 DIAGNOSIS — C189 Malignant neoplasm of colon, unspecified: Secondary | ICD-10-CM

## 2015-01-24 LAB — POCT INR: INR: 2.7

## 2015-01-24 LAB — COMPREHENSIVE METABOLIC PANEL (CC13)
ALBUMIN: 3.2 g/dL — AB (ref 3.5–5.0)
ALT: 24 U/L (ref 0–55)
ANION GAP: 9 meq/L (ref 3–11)
AST: 28 U/L (ref 5–34)
Alkaline Phosphatase: 139 U/L (ref 40–150)
BUN: 10.1 mg/dL (ref 7.0–26.0)
CALCIUM: 8.7 mg/dL (ref 8.4–10.4)
CHLORIDE: 111 meq/L — AB (ref 98–109)
CO2: 22 mEq/L (ref 22–29)
CREATININE: 0.8 mg/dL (ref 0.6–1.1)
Glucose: 113 mg/dl (ref 70–140)
Potassium: 3.6 mEq/L (ref 3.5–5.1)
Sodium: 142 mEq/L (ref 136–145)
Total Bilirubin: 0.34 mg/dL (ref 0.20–1.20)
Total Protein: 6.1 g/dL — ABNORMAL LOW (ref 6.4–8.3)

## 2015-01-24 LAB — CBC WITH DIFFERENTIAL/PLATELET
BASO%: 0.8 % (ref 0.0–2.0)
Basophils Absolute: 0.1 10*3/uL (ref 0.0–0.1)
EOS ABS: 0.1 10*3/uL (ref 0.0–0.5)
EOS%: 1 % (ref 0.0–7.0)
HCT: 31.7 % — ABNORMAL LOW (ref 34.8–46.6)
HGB: 9.9 g/dL — ABNORMAL LOW (ref 11.6–15.9)
LYMPH%: 24.5 % (ref 14.0–49.7)
MCH: 26 pg (ref 25.1–34.0)
MCHC: 31.2 g/dL — AB (ref 31.5–36.0)
MCV: 83.3 fL (ref 79.5–101.0)
MONO#: 0.8 10*3/uL (ref 0.1–0.9)
MONO%: 11.7 % (ref 0.0–14.0)
NEUT%: 62 % (ref 38.4–76.8)
NEUTROS ABS: 4.3 10*3/uL (ref 1.5–6.5)
PLATELETS: 175 10*3/uL (ref 145–400)
RBC: 3.81 10*6/uL (ref 3.70–5.45)
RDW: 33.9 % — ABNORMAL HIGH (ref 11.2–14.5)
WBC: 6.9 10*3/uL (ref 3.9–10.3)
lymph#: 1.7 10*3/uL (ref 0.9–3.3)

## 2015-01-24 LAB — PROTIME-INR
INR: 2.7 (ref 2.00–3.50)
Protime: 32.4 Seconds — ABNORMAL HIGH (ref 10.6–13.4)

## 2015-01-24 NOTE — Patient Instructions (Signed)
Continue Coumadin 2.5 mg daily (half tablet) except for 5 mg (whole tablet) Wednesday. Return on 02/06/15 at 9:00am for lab and 9:15am for coumadin clinic

## 2015-01-24 NOTE — Progress Notes (Signed)
Pt seen in clinic today prior to radiation INR=2.7 She has no changes to report INR back in therapeutic range today Continue Coumadin 2.5 mg daily (half tablet) except for 5 mg (whole tablet) Wednesday.  Return on 02/06/15 at 9:00am for lab and 9:15am for coumadin clinic Radiation is prior to her CC appmt on 02/06/15

## 2015-01-25 ENCOUNTER — Ambulatory Visit
Admission: RE | Admit: 2015-01-25 | Discharge: 2015-01-25 | Disposition: A | Discharge status: Home / Self Care | Payer: BLUE CROSS/BLUE SHIELD | Source: Ambulatory Visit | Attending: Radiation Oncology | Admitting: Radiation Oncology

## 2015-01-25 DIAGNOSIS — C19 Malignant neoplasm of rectosigmoid junction: Secondary | ICD-10-CM | POA: Diagnosis not present

## 2015-01-25 LAB — CEA: CEA: 2.1 ng/mL (ref 0.0–5.0)

## 2015-01-26 ENCOUNTER — Ambulatory Visit
Admission: RE | Admit: 2015-01-26 | Discharge: 2015-01-26 | Disposition: A | Discharge status: Home / Self Care | Payer: BLUE CROSS/BLUE SHIELD | Source: Ambulatory Visit | Attending: Radiation Oncology | Admitting: Radiation Oncology

## 2015-01-26 ENCOUNTER — Encounter: Payer: Self-pay | Admitting: Radiation Oncology

## 2015-01-26 VITALS — BP 121/65 | HR 90 | Temp 98.3°F | Resp 18 | Wt 213.0 lb

## 2015-01-26 DIAGNOSIS — C19 Malignant neoplasm of rectosigmoid junction: Secondary | ICD-10-CM | POA: Diagnosis not present

## 2015-01-26 NOTE — Progress Notes (Signed)
   Department of Radiation Oncology  Phone:  [phone] Fax:        [phone]  Weekly Treatment Note    Name: Allison Mitchell Date: 01/26/2015 MRN: [id number] DOB: 1960/12/17   Current dose: 7.2 Gy  Current fraction: 4   MEDICATIONS: Current Outpatient Prescriptions  Medication Sig Dispense Refill  . citalopram (CELEXA) 20 MG tablet Take 20-40 mg by mouth daily.   1  . hyaluronate sodium (RADIAPLEXRX) GEL Apply 1 application topically daily. Apply to abdomen  treated area after rad tx daily    . lidocaine-prilocaine (EMLA) cream Apply 1 application topically as needed. 30 g 0  . magnesium hydroxide (MILK OF MAGNESIA) 400 MG/5ML suspension Take 5 mLs by mouth daily as needed for mild constipation.    . Meth-Hyo-M Bl-Na Phos-Ph Sal (URIBEL) 118 MG CAPS Take 1 capsule by mouth as needed. Every 8 hours as needed  11  . omeprazole (PRILOSEC) 40 MG capsule Take 40 mg by mouth every morning.  12  . ondansetron (ZOFRAN) 8 MG tablet Take 1 tablet (8 mg total) by mouth every 8 (eight) hours as needed for nausea or vomiting. 20 tablet 3  . traMADol (ULTRAM) 50 MG tablet Take 1 tablet (50 mg total) by mouth every 6 (six) hours as needed. For post op and cancer pain 30 tablet 0  . warfarin (COUMADIN) 5 MG tablet Take 1 tablet (5 mg total) by mouth daily. To start 12/28/14 in evening (Patient taking differently: Take 2.5-5 mg by mouth daily. ALTERNATES 2.5MG  AND 5MG --) 30 tablet 1  . zolpidem (AMBIEN) 5 MG tablet Take 1 tablet (5 mg total) by mouth at bedtime as needed for sleep. 20 tablet 0   No current facility-administered medications for this encounter.     ALLERGIES: Amoxicillin   LABORATORY DATA:  Lab Results  Component Value Date   WBC 6.9 01/24/2015   HGB 9.9* 01/24/2015   HCT 31.7* 01/24/2015   MCV 83.3 01/24/2015   PLT 175 01/24/2015   Lab Results  Component Value Date   NA 142 01/24/2015   K 3.6 01/24/2015   CL 106 01/10/2015   CO2 22 01/24/2015   Lab Results    Component Value Date   ALT 24 01/24/2015   AST 28 01/24/2015   ALKPHOS 139 01/24/2015   BILITOT 0.34 01/24/2015     NARRATIVE: Allison Mitchell was seen today for weekly treatment management. The chart was checked and the patient's films were reviewed.  Patient denies pain but states she continues to have pain with urination. She is taking Uribel every 8 hours, Tramadol prn but mainly at night. She denies bowel issues, fatigue, loss of appetite.  PHYSICAL EXAMINATION: weight is 213 lb (96.616 kg). Her oral temperature is 98.3 F (36.8 C). Her blood pressure is 121/65 and her pulse is 90. Her respiration is 18.        ASSESSMENT: The patient is doing satisfactorily with treatment.  PLAN: We will continue with the patient's radiation treatment as planned.

## 2015-01-26 NOTE — Progress Notes (Signed)
Patient denies pain but states she continues to have pain with urination. She is taking Uribel every 8 hours, Tramadol prn but mainly at night. She denies bowel issues, fatigue, loss of appetite.

## 2015-01-29 ENCOUNTER — Ambulatory Visit
Admission: RE | Admit: 2015-01-29 | Discharge: 2015-01-29 | Disposition: A | Discharge status: Home / Self Care | Payer: BLUE CROSS/BLUE SHIELD | Source: Ambulatory Visit | Attending: Radiation Oncology | Admitting: Radiation Oncology

## 2015-01-29 DIAGNOSIS — C19 Malignant neoplasm of rectosigmoid junction: Secondary | ICD-10-CM | POA: Diagnosis not present

## 2015-01-30 ENCOUNTER — Ambulatory Visit
Admission: RE | Admit: 2015-01-30 | Discharge: 2015-01-30 | Disposition: A | Discharge status: Home / Self Care | Payer: BLUE CROSS/BLUE SHIELD | Source: Ambulatory Visit | Attending: Radiation Oncology | Admitting: Radiation Oncology

## 2015-01-30 DIAGNOSIS — C19 Malignant neoplasm of rectosigmoid junction: Secondary | ICD-10-CM | POA: Diagnosis not present

## 2015-01-31 ENCOUNTER — Ambulatory Visit
Admission: RE | Admit: 2015-01-31 | Discharge: 2015-01-31 | Disposition: A | Discharge status: Home / Self Care | Payer: BLUE CROSS/BLUE SHIELD | Source: Ambulatory Visit | Attending: Radiation Oncology | Admitting: Radiation Oncology

## 2015-01-31 ENCOUNTER — Encounter: Payer: Self-pay | Admitting: Radiation Oncology

## 2015-01-31 VITALS — BP 139/74 | HR 89 | Temp 98.1°F | Resp 20 | Wt 216.5 lb

## 2015-01-31 DIAGNOSIS — C19 Malignant neoplasm of rectosigmoid junction: Secondary | ICD-10-CM

## 2015-01-31 NOTE — Progress Notes (Signed)
   Department of Radiation Oncology  Phone:  [phone] Fax:        [phone]  Weekly Treatment Note  Name:  Date: 01/31/2015 MRN: [id number] DOB: April 12, 1961   ICD-9-CM ICD-10-CM   1. Colorectal cancer, stage III 154.0 C19     Current dose: 12.6Gy  Current fraction: 7   MEDICATIONS: Current Outpatient Prescriptions  Medication Sig Dispense Refill  . citalopram (CELEXA) 20 MG tablet Take 20-40 mg by mouth daily.   1  . hyaluronate sodium (RADIAPLEXRX) GEL Apply 1 application topically daily. Apply to abdomen  treated area after rad tx daily    . lidocaine-prilocaine (EMLA) cream Apply 1 application topically as needed. 30 g 0  . magnesium hydroxide (MILK OF MAGNESIA) 400 MG/5ML suspension Take 5 mLs by mouth daily as needed for mild constipation.    . Meth-Hyo-M Bl-Na Phos-Ph Sal (URIBEL) 118 MG CAPS Take 1 capsule by mouth as needed. Every 8 hours as needed  11  . omeprazole (PRILOSEC) 40 MG capsule Take 40 mg by mouth every morning.  12  . ondansetron (ZOFRAN) 8 MG tablet Take 1 tablet (8 mg total) by mouth every 8 (eight) hours as needed for nausea or vomiting. 20 tablet 3  . traMADol (ULTRAM) 50 MG tablet Take 1 tablet (50 mg total) by mouth every 6 (six) hours as needed. For post op and cancer pain 30 tablet 0  . warfarin (COUMADIN) 5 MG tablet Take 1 tablet (5 mg total) by mouth daily. To start 12/28/14 in evening (Patient taking differently: Take 2.5-5 mg by mouth daily. ALTERNATES 2.5MG  AND 5MG --) 30 tablet 1  . zolpidem (AMBIEN) 5 MG tablet Take 1 tablet (5 mg total) by mouth at bedtime as needed for sleep. 20 tablet 0   No current facility-administered medications for this encounter.     ALLERGIES: Amoxicillin   LABORATORY DATA:  Lab Results  Component Value Date   WBC 6.9 01/24/2015   HGB 9.9* 01/24/2015   HCT 31.7* 01/24/2015   MCV 83.3 01/24/2015   PLT 175 01/24/2015   Lab Results  Component Value Date   NA 142 01/24/2015   K 3.6 01/24/2015   CL 106  01/10/2015   CO2 22 01/24/2015   Lab Results  Component Value Date   ALT 24 01/24/2015   AST 28 01/24/2015   ALKPHOS 139 01/24/2015   BILITOT 0.34 01/24/2015     NARRATIVE: Allison Mitchell was seen today for weekly treatment management. The chart was checked and the patient's films were reviewed.  She is working. No new complaints  PHYSICAL EXAMINATION: weight is 216 lb 8 oz (98.204 kg). Her oral temperature is 98.1 F (36.7 C). Her blood pressure is 139/74 and her pulse is 89. Her respiration is 20.        ASSESSMENT: The patient is doing satisfactorily with treatment.  PLAN: We will continue with the patient's radiation treatment as planned.    -----------------------------------  Eppie Gibson, MD

## 2015-01-31 NOTE — Progress Notes (Signed)
Weekly rad txs abd/pelvis,  7/30 completed, colostomy has green soft stool, no nausea, no gas,  Has radiaplex gel on hand not using yet, no skin changes, working almost a full day, rests prn, appetite good, stilltaking uribel  For bladder,  Sees Dr. Hulen Skains ,surgeon  May 31,2016,chemotherapy to start after radiation completed,  9:50 AM

## 2015-02-01 ENCOUNTER — Ambulatory Visit
Admission: RE | Admit: 2015-02-01 | Discharge: 2015-02-01 | Disposition: A | Discharge status: Home / Self Care | Payer: BLUE CROSS/BLUE SHIELD | Source: Ambulatory Visit | Attending: Radiation Oncology | Admitting: Radiation Oncology

## 2015-02-01 DIAGNOSIS — C19 Malignant neoplasm of rectosigmoid junction: Secondary | ICD-10-CM | POA: Diagnosis not present

## 2015-02-02 ENCOUNTER — Ambulatory Visit: Payer: BLUE CROSS/BLUE SHIELD

## 2015-02-02 ENCOUNTER — Ambulatory Visit
Admission: RE | Admit: 2015-02-02 | Discharge: 2015-02-02 | Disposition: A | Discharge status: Home / Self Care | Payer: BLUE CROSS/BLUE SHIELD | Source: Ambulatory Visit | Attending: Radiation Oncology | Admitting: Radiation Oncology

## 2015-02-05 ENCOUNTER — Ambulatory Visit
Admission: RE | Admit: 2015-02-05 | Discharge: 2015-02-05 | Disposition: A | Discharge status: Home / Self Care | Payer: BLUE CROSS/BLUE SHIELD | Source: Ambulatory Visit | Attending: Radiation Oncology | Admitting: Radiation Oncology

## 2015-02-05 DIAGNOSIS — C19 Malignant neoplasm of rectosigmoid junction: Secondary | ICD-10-CM | POA: Diagnosis not present

## 2015-02-06 ENCOUNTER — Ambulatory Visit
Admission: RE | Admit: 2015-02-06 | Discharge: 2015-02-06 | Disposition: A | Discharge status: Home / Self Care | Payer: BLUE CROSS/BLUE SHIELD | Source: Ambulatory Visit | Attending: Radiation Oncology | Admitting: Radiation Oncology

## 2015-02-06 ENCOUNTER — Ambulatory Visit (HOSPITAL_BASED_OUTPATIENT_CLINIC_OR_DEPARTMENT_OTHER): Payer: Self-pay | Admitting: Pharmacist

## 2015-02-06 ENCOUNTER — Other Ambulatory Visit (HOSPITAL_BASED_OUTPATIENT_CLINIC_OR_DEPARTMENT_OTHER): Payer: BLUE CROSS/BLUE SHIELD

## 2015-02-06 DIAGNOSIS — I2699 Other pulmonary embolism without acute cor pulmonale: Secondary | ICD-10-CM

## 2015-02-06 DIAGNOSIS — C19 Malignant neoplasm of rectosigmoid junction: Secondary | ICD-10-CM | POA: Diagnosis not present

## 2015-02-06 LAB — PROTIME-INR
INR: 1.4 — ABNORMAL LOW (ref 2.00–3.50)
Protime: 16.8 Seconds — ABNORMAL HIGH (ref 10.6–13.4)

## 2015-02-06 LAB — POCT INR: INR: 1.4

## 2015-02-06 NOTE — Progress Notes (Signed)
INR below goal today at 1.4 (goal 2-3) Pt is doing well with no complaints No unusual bleeding or bruising No S/Sx of clot - no shortness of breath, no lower or upper extremity pain or swelling Pt denies any missed doses and states she has taken her coumadin as instructed Unsure why sudden drop in INR in the last 2 weeks No diet or medication changes Will boost coumadin dose this week and recheck next week.  If INR below goal at next visit may need to consider bridge with lovenox Plan:  Today take 5 mg (whole tablet) Then increase dose to 5 mg (whole tablet) on Wednesday and Saturdays and 2.5 mg (half tab) all other days. Return on 02/14/15 at 9:00am for lab and 9:15am for coumadin clinic.

## 2015-02-06 NOTE — Patient Instructions (Signed)
INR below goal Today take 5 mg (whole tablet) Then increase dose to 5 mg (whole tablet on Wednesday and Saturdays and half tab all other days. Return on 02/14/15 at 9:00am for lab and 9:15am for coumadin clinic.

## 2015-02-07 ENCOUNTER — Ambulatory Visit
Admission: RE | Admit: 2015-02-07 | Discharge: 2015-02-07 | Disposition: A | Discharge status: Home / Self Care | Payer: BLUE CROSS/BLUE SHIELD | Source: Ambulatory Visit | Attending: Radiation Oncology | Admitting: Radiation Oncology

## 2015-02-07 DIAGNOSIS — C19 Malignant neoplasm of rectosigmoid junction: Secondary | ICD-10-CM

## 2015-02-07 NOTE — Progress Notes (Signed)
   Department of Radiation Oncology  Phone:  [phone] Fax:        [phone]  Weekly Treatment Note    Name: Allison Mitchell Date: 02/07/2015 MRN: [id number] DOB: 07/30/1961   Current dose: 19.8 Gy  Current fraction: 11   MEDICATIONS: Current Outpatient Prescriptions  Medication Sig Dispense Refill  . citalopram (CELEXA) 20 MG tablet Take 20-40 mg by mouth daily.   1  . hyaluronate sodium (RADIAPLEXRX) GEL Apply 1 application topically daily. Apply to abdomen  treated area after rad tx daily    . lidocaine-prilocaine (EMLA) cream Apply 1 application topically as needed. 30 g 0  . magnesium hydroxide (MILK OF MAGNESIA) 400 MG/5ML suspension Take 5 mLs by mouth daily as needed for mild constipation.    . Meth-Hyo-M Bl-Na Phos-Ph Sal (URIBEL) 118 MG CAPS Take 1 capsule by mouth as needed. Every 8 hours as needed  11  . omeprazole (PRILOSEC) 40 MG capsule Take 40 mg by mouth every morning.  12  . ondansetron (ZOFRAN) 8 MG tablet Take 1 tablet (8 mg total) by mouth every 8 (eight) hours as needed for nausea or vomiting. 20 tablet 3  . traMADol (ULTRAM) 50 MG tablet Take 1 tablet (50 mg total) by mouth every 6 (six) hours as needed. For post op and cancer pain 30 tablet 0  . warfarin (COUMADIN) 5 MG tablet Take 1 tablet (5 mg total) by mouth daily. To start 12/28/14 in evening (Patient taking differently: Take 2.5-5 mg by mouth daily. ALTERNATES 2.5MG  AND 5MG --) 30 tablet 1  . zolpidem (AMBIEN) 5 MG tablet Take 1 tablet (5 mg total) by mouth at bedtime as needed for sleep. 20 tablet 0   No current facility-administered medications for this encounter.     ALLERGIES: Amoxicillin   LABORATORY DATA:  Lab Results  Component Value Date   WBC 6.9 01/24/2015   HGB 9.9* 01/24/2015   HCT 31.7* 01/24/2015   MCV 83.3 01/24/2015   PLT 175 01/24/2015   Lab Results  Component Value Date   NA 142 01/24/2015   K 3.6 01/24/2015   CL 106 01/10/2015   CO2 22 01/24/2015   Lab Results    Component Value Date   ALT 24 01/24/2015   AST 28 01/24/2015   ALKPHOS 139 01/24/2015   BILITOT 0.34 01/24/2015     NARRATIVE: Allison Mitchell was seen today for weekly treatment management. The chart was checked and the patient's films were reviewed.  Weekly rad txs  Abd/pelv  11 completed,  No c/o nausea, has several loose stools, not taking imodium, energy level so-so, no c/o pain, hair is falling out s/p chemotherapy, to restart after rad  Completed,   12:16 PM   PHYSICAL EXAMINATION:  temperature 97.9, blood pressure 127/72, pulse 81, respiratory rate 20  ASSESSMENT: The patient is doing satisfactorily with treatment.  PLAN: We will continue with the patient's radiation treatment as planned.

## 2015-02-07 NOTE — Progress Notes (Addendum)
Weekly rad txs  Abd/pelv  11 completed,  No c/o nausea, has several loose stools, not taking imodium, energy level so-so, no c/o pain, hair is falling out s/p chemotherapy, to restart after rad  Completed,   9:00 AM

## 2015-02-08 ENCOUNTER — Ambulatory Visit
Admission: RE | Admit: 2015-02-08 | Discharge: 2015-02-08 | Disposition: A | Discharge status: Home / Self Care | Payer: BLUE CROSS/BLUE SHIELD | Source: Ambulatory Visit | Attending: Radiation Oncology | Admitting: Radiation Oncology

## 2015-02-08 DIAGNOSIS — C19 Malignant neoplasm of rectosigmoid junction: Secondary | ICD-10-CM | POA: Diagnosis not present

## 2015-02-09 ENCOUNTER — Ambulatory Visit
Admission: RE | Admit: 2015-02-09 | Discharge: 2015-02-09 | Disposition: A | Discharge status: Home / Self Care | Payer: BLUE CROSS/BLUE SHIELD | Source: Ambulatory Visit | Attending: Radiation Oncology | Admitting: Radiation Oncology

## 2015-02-09 DIAGNOSIS — C19 Malignant neoplasm of rectosigmoid junction: Secondary | ICD-10-CM | POA: Diagnosis not present

## 2015-02-12 ENCOUNTER — Ambulatory Visit
Admission: RE | Admit: 2015-02-12 | Discharge: 2015-02-12 | Disposition: A | Discharge status: Home / Self Care | Payer: BLUE CROSS/BLUE SHIELD | Source: Ambulatory Visit | Attending: Radiation Oncology | Admitting: Radiation Oncology

## 2015-02-12 DIAGNOSIS — C19 Malignant neoplasm of rectosigmoid junction: Secondary | ICD-10-CM | POA: Diagnosis not present

## 2015-02-13 ENCOUNTER — Ambulatory Visit: Admission: RE | Admit: 2015-02-13 | Payer: BLUE CROSS/BLUE SHIELD | Source: Ambulatory Visit

## 2015-02-13 ENCOUNTER — Ambulatory Visit
Admission: RE | Admit: 2015-02-13 | Discharge: 2015-02-13 | Disposition: A | Discharge status: Home / Self Care | Payer: BLUE CROSS/BLUE SHIELD | Source: Ambulatory Visit | Attending: Radiation Oncology | Admitting: Radiation Oncology

## 2015-02-13 DIAGNOSIS — C19 Malignant neoplasm of rectosigmoid junction: Secondary | ICD-10-CM | POA: Insufficient documentation

## 2015-02-14 ENCOUNTER — Ambulatory Visit (HOSPITAL_BASED_OUTPATIENT_CLINIC_OR_DEPARTMENT_OTHER): Payer: BLUE CROSS/BLUE SHIELD | Admitting: Pharmacist

## 2015-02-14 ENCOUNTER — Other Ambulatory Visit (HOSPITAL_BASED_OUTPATIENT_CLINIC_OR_DEPARTMENT_OTHER): Payer: BLUE CROSS/BLUE SHIELD

## 2015-02-14 ENCOUNTER — Other Ambulatory Visit: Payer: Self-pay | Admitting: Hematology

## 2015-02-14 ENCOUNTER — Ambulatory Visit
Admission: RE | Admit: 2015-02-14 | Discharge: 2015-02-14 | Disposition: A | Discharge status: Home / Self Care | Payer: BLUE CROSS/BLUE SHIELD | Source: Ambulatory Visit | Attending: Radiation Oncology | Admitting: Radiation Oncology

## 2015-02-14 DIAGNOSIS — I2699 Other pulmonary embolism without acute cor pulmonale: Secondary | ICD-10-CM

## 2015-02-14 DIAGNOSIS — C19 Malignant neoplasm of rectosigmoid junction: Secondary | ICD-10-CM | POA: Diagnosis not present

## 2015-02-14 DIAGNOSIS — C189 Malignant neoplasm of colon, unspecified: Secondary | ICD-10-CM

## 2015-02-14 DIAGNOSIS — C180 Malignant neoplasm of cecum: Secondary | ICD-10-CM

## 2015-02-14 LAB — COMPREHENSIVE METABOLIC PANEL (CC13)
ALBUMIN: 3.3 g/dL — AB (ref 3.5–5.0)
ALK PHOS: 121 U/L (ref 40–150)
ALT: 15 U/L (ref 0–55)
AST: 23 U/L (ref 5–34)
Anion Gap: 11 mEq/L (ref 3–11)
BUN: 9.5 mg/dL (ref 7.0–26.0)
CO2: 24 mEq/L (ref 22–29)
Calcium: 8.3 mg/dL — ABNORMAL LOW (ref 8.4–10.4)
Chloride: 107 mEq/L (ref 98–109)
Creatinine: 0.8 mg/dL (ref 0.6–1.1)
EGFR: 88 mL/min/{1.73_m2} — ABNORMAL LOW (ref >90)
Glucose: 119 mg/dl (ref 70–140)
POTASSIUM: 3.8 meq/L (ref 3.5–5.1)
SODIUM: 143 meq/L (ref 136–145)
TOTAL PROTEIN: 6.2 g/dL — AB (ref 6.4–8.3)
Total Bilirubin: 0.34 mg/dL (ref 0.20–1.20)

## 2015-02-14 LAB — CBC WITH DIFFERENTIAL/PLATELET
BASO%: 0.9 % (ref 0.0–2.0)
Basophils Absolute: 0 10*3/uL (ref 0.0–0.1)
EOS%: 7.3 % — AB (ref 0.0–7.0)
Eosinophils Absolute: 0.3 10*3/uL (ref 0.0–0.5)
HEMATOCRIT: 31.9 % — AB (ref 34.8–46.6)
HGB: 9.9 g/dL — ABNORMAL LOW (ref 11.6–15.9)
LYMPH%: 12.6 % — ABNORMAL LOW (ref 14.0–49.7)
MCH: 26.9 pg (ref 25.1–34.0)
MCHC: 31.2 g/dL — AB (ref 31.5–36.0)
MCV: 86.5 fL (ref 79.5–101.0)
MONO#: 0.4 10*3/uL (ref 0.1–0.9)
MONO%: 9 % (ref 0.0–14.0)
NEUT#: 3.2 10*3/uL (ref 1.5–6.5)
NEUT%: 70.2 % (ref 38.4–76.8)
Platelets: 160 10*3/uL (ref 145–400)
RBC: 3.69 10*6/uL — ABNORMAL LOW (ref 3.70–5.45)
RDW: 28.8 % — AB (ref 11.2–14.5)
WBC: 4.5 10*3/uL (ref 3.9–10.3)
lymph#: 0.6 10*3/uL — ABNORMAL LOW (ref 0.9–3.3)

## 2015-02-14 LAB — PROTIME-INR
INR: 1.7 — ABNORMAL LOW (ref 2.00–3.50)
PROTIME: 20.4 s — AB (ref 10.6–13.4)

## 2015-02-14 LAB — CEA: CEA: 1.3 ng/mL (ref 0.0–5.0)

## 2015-02-14 NOTE — Progress Notes (Signed)
INR = 1.7     Goal 2-3 INR remains below goal range despite dose increase. No medication or dietary changes. Patient states she has had diarrhea since starting XRT, this is the only change. She will complete XRT 03/06/15. She continues to eat her normal diet. She has had blood in her urine periodically since urinary stents placed in December, urologist is aware of this. We will increase dose to 5 mg (whole tablet) on Monday, Wednesday, Friday and Saturdays and half tab all other days. She will return on 02/21/15 at 9:00am for lab and 9:15am for Coumadin clinic.  Theone Murdoch, PharmD

## 2015-02-14 NOTE — Telephone Encounter (Signed)
Had electronic refill request. S/w pt she is using tramadol for pain control, she also needs warfarin refill. Called Rxs to pharmacy.

## 2015-02-15 ENCOUNTER — Ambulatory Visit
Admission: RE | Admit: 2015-02-15 | Discharge: 2015-02-15 | Disposition: A | Discharge status: Home / Self Care | Payer: BLUE CROSS/BLUE SHIELD | Source: Ambulatory Visit | Attending: Radiation Oncology | Admitting: Radiation Oncology

## 2015-02-15 DIAGNOSIS — C19 Malignant neoplasm of rectosigmoid junction: Secondary | ICD-10-CM | POA: Diagnosis not present

## 2015-02-16 ENCOUNTER — Ambulatory Visit
Admission: RE | Admit: 2015-02-16 | Discharge: 2015-02-16 | Disposition: A | Discharge status: Home / Self Care | Payer: BLUE CROSS/BLUE SHIELD | Source: Ambulatory Visit | Attending: Radiation Oncology | Admitting: Radiation Oncology

## 2015-02-16 ENCOUNTER — Encounter: Payer: Self-pay | Admitting: Radiation Oncology

## 2015-02-16 VITALS — BP 123/73 | HR 77 | Temp 98.0°F | Resp 20 | Ht 68.0 in | Wt 222.3 lb

## 2015-02-16 DIAGNOSIS — C19 Malignant neoplasm of rectosigmoid junction: Secondary | ICD-10-CM | POA: Diagnosis not present

## 2015-02-16 NOTE — Progress Notes (Signed)
Weekly rad tx abd/pelvis 18/33 completed, had   Episodes of diarrhea earlier this week has resolved , some abd gas, appetite better, energy lwvel okay 9:07 AM BP 123/73 mmHg  Pulse 77  Temp(Src) 98 F (36.7 C) (Oral)  Resp 20  Ht 5\' 8"  (1.727 m)  Wt 222 lb 4.8 oz (100.835 kg)  BMI 33.81 kg/m2  SpO2 100%  Wt Readings from Last 3 Encounters:  01/10/15 206 lb (93.441 kg)  01/04/15 208 lb 14.4 oz (94.756 kg)  12/21/14 210 lb 8 oz (95.482 kg)

## 2015-02-16 NOTE — Progress Notes (Signed)
   Department of Radiation Oncology  Phone:  [phone] Fax:        [phone]  Weekly Treatment Note    Name: Allison Mitchell Date: 02/16/2015 MRN: [id number] DOB: [birth year]   Current dose: 32.4 Gy  Current fraction: 18   MEDICATIONS: Current Outpatient Prescriptions  Medication Sig Dispense Refill  . citalopram (CELEXA) 20 MG tablet Take 20-40 mg by mouth daily.   1  . hyaluronate sodium (RADIAPLEXRX) GEL Apply 1 application topically daily. Apply to abdomen  treated area after rad tx daily    . lidocaine-prilocaine (EMLA) cream Apply 1 application topically as needed. 30 g 0  . magnesium hydroxide (MILK OF MAGNESIA) 400 MG/5ML suspension Take 5 mLs by mouth daily as needed for mild constipation.    . Meth-Hyo-M Bl-Na Phos-Ph Sal (URIBEL) 118 MG CAPS Take 1 capsule by mouth as needed. Every 8 hours as needed  11  . omeprazole (PRILOSEC) 40 MG capsule Take 40 mg by mouth every morning.  12  . ondansetron (ZOFRAN) 8 MG tablet Take 1 tablet (8 mg total) by mouth every 8 (eight) hours as needed for nausea or vomiting. 20 tablet 3  . traMADol (ULTRAM) 50 MG tablet TAKE ONE TABLET EVERY 6 HOURS AS NEEDED FOR PAIN 30 tablet 0  . warfarin (COUMADIN) 5 MG tablet Take as directed by coumadin clinic 30 tablet 1  . zolpidem (AMBIEN) 5 MG tablet Take 1 tablet (5 mg total) by mouth at bedtime as needed for sleep. 20 tablet 0   No current facility-administered medications for this encounter.     ALLERGIES: Amoxicillin   LABORATORY DATA:  Lab Results  Component Value Date   WBC 4.5 02/14/2015   HGB 9.9* 02/14/2015   HCT 31.9* 02/14/2015   MCV 86.5 02/14/2015   PLT 160 02/14/2015   Lab Results  Component Value Date   NA 143 02/14/2015   K 3.8 02/14/2015   CL 106 01/10/2015   CO2 24 02/14/2015   Lab Results  Component Value Date   ALT 15 02/14/2015   AST 23 02/14/2015   ALKPHOS 121 02/14/2015   BILITOT 0.34 02/14/2015     NARRATIVE: Allison Mitchell was seen today  for weekly treatment management. The chart was checked and the patient's films were reviewed.  The patient is doing very well. She does complain of some fatigue. Some diarrhea earlier this week that this has resolved. No difficulties with nausea.  PHYSICAL EXAMINATION: height is 5\' 8"  (1.727 m) and weight is 222 lb 4.8 oz (100.835 kg). Her oral temperature is 98 F (36.7 C). Her blood pressure is 123/73 and her pulse is 77. Her respiration is 20 and oxygen saturation is 100%.        ASSESSMENT: The patient is doing satisfactorily with treatment.  PLAN: We will continue with the patient's radiation treatment as planned.

## 2015-02-19 ENCOUNTER — Ambulatory Visit
Admission: RE | Admit: 2015-02-19 | Discharge: 2015-02-19 | Disposition: A | Discharge status: Home / Self Care | Payer: BLUE CROSS/BLUE SHIELD | Source: Ambulatory Visit | Attending: Radiation Oncology | Admitting: Radiation Oncology

## 2015-02-19 DIAGNOSIS — C19 Malignant neoplasm of rectosigmoid junction: Secondary | ICD-10-CM | POA: Diagnosis not present

## 2015-02-20 ENCOUNTER — Ambulatory Visit
Admission: RE | Admit: 2015-02-20 | Discharge: 2015-02-20 | Disposition: A | Discharge status: Home / Self Care | Payer: BLUE CROSS/BLUE SHIELD | Source: Ambulatory Visit | Attending: Radiation Oncology | Admitting: Radiation Oncology

## 2015-02-20 DIAGNOSIS — C19 Malignant neoplasm of rectosigmoid junction: Secondary | ICD-10-CM | POA: Diagnosis not present

## 2015-02-21 ENCOUNTER — Ambulatory Visit (HOSPITAL_BASED_OUTPATIENT_CLINIC_OR_DEPARTMENT_OTHER): Payer: Self-pay | Admitting: Pharmacist

## 2015-02-21 ENCOUNTER — Encounter: Payer: Self-pay | Admitting: Radiation Oncology

## 2015-02-21 ENCOUNTER — Other Ambulatory Visit (HOSPITAL_BASED_OUTPATIENT_CLINIC_OR_DEPARTMENT_OTHER): Payer: BLUE CROSS/BLUE SHIELD

## 2015-02-21 ENCOUNTER — Ambulatory Visit
Admission: RE | Admit: 2015-02-21 | Discharge: 2015-02-21 | Disposition: A | Discharge status: Home / Self Care | Payer: BLUE CROSS/BLUE SHIELD | Source: Ambulatory Visit | Attending: Radiation Oncology | Admitting: Radiation Oncology

## 2015-02-21 DIAGNOSIS — C19 Malignant neoplasm of rectosigmoid junction: Secondary | ICD-10-CM | POA: Diagnosis not present

## 2015-02-21 DIAGNOSIS — I2699 Other pulmonary embolism without acute cor pulmonale: Secondary | ICD-10-CM

## 2015-02-21 LAB — PROTIME-INR
INR: 2.6 (ref 2.00–3.50)
Protime: 31.2 Seconds — ABNORMAL HIGH (ref 10.6–13.4)

## 2015-02-21 LAB — POCT INR: INR: 2.6

## 2015-02-21 NOTE — Progress Notes (Signed)
INR = 2.6 on Coumadin 5 mg daily except 2.5 mg on Tu/Th/Sun. No missed doses. Meds same. No complaints re: anticoag. INR therapeutic.  Cont same dose. Finishing XRT on 03/06/15- we'll see her back for protime that day after XRT completed. Kennith Center, Pharm.D., CPP 02/21/2015@9 :21 AM

## 2015-02-22 ENCOUNTER — Ambulatory Visit
Admission: RE | Admit: 2015-02-22 | Discharge: 2015-02-22 | Disposition: A | Discharge status: Home / Self Care | Payer: BLUE CROSS/BLUE SHIELD | Source: Ambulatory Visit | Attending: Radiation Oncology | Admitting: Radiation Oncology

## 2015-02-22 DIAGNOSIS — C19 Malignant neoplasm of rectosigmoid junction: Secondary | ICD-10-CM | POA: Diagnosis not present

## 2015-02-23 ENCOUNTER — Ambulatory Visit
Admission: RE | Admit: 2015-02-23 | Discharge: 2015-02-23 | Disposition: A | Discharge status: Home / Self Care | Payer: BLUE CROSS/BLUE SHIELD | Source: Ambulatory Visit | Attending: Radiation Oncology | Admitting: Radiation Oncology

## 2015-02-23 VITALS — BP 120/54 | HR 84 | Temp 98.4°F | Wt 217.5 lb

## 2015-02-23 DIAGNOSIS — C19 Malignant neoplasm of rectosigmoid junction: Secondary | ICD-10-CM

## 2015-02-23 NOTE — Progress Notes (Signed)
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

## 2015-02-23 NOTE — Progress Notes (Signed)
   Department of Radiation Oncology  Phone:  [phone] Fax:        [phone]  Weekly Treatment Note    Name: Allison Mitchell Date: 02/23/2015 MRN: [id number] DOB: 1961/08/01   Current dose: 41.4 Gy  Current fraction: 23   MEDICATIONS: Current Outpatient Prescriptions  Medication Sig Dispense Refill  . citalopram (CELEXA) 20 MG tablet Take 20-40 mg by mouth daily.   1  . hyaluronate sodium (RADIAPLEXRX) GEL Apply 1 application topically daily. Apply to abdomen  treated area after rad tx daily    . lidocaine-prilocaine (EMLA) cream Apply 1 application topically as needed. 30 g 0  . magnesium hydroxide (MILK OF MAGNESIA) 400 MG/5ML suspension Take 5 mLs by mouth daily as needed for mild constipation.    . Meth-Hyo-M Bl-Na Phos-Ph Sal (URIBEL) 118 MG CAPS Take 1 capsule by mouth as needed. Every 8 hours as needed  11  . omeprazole (PRILOSEC) 40 MG capsule Take 40 mg by mouth every morning.  12  . ondansetron (ZOFRAN) 8 MG tablet Take 1 tablet (8 mg total) by mouth every 8 (eight) hours as needed for nausea or vomiting. 20 tablet 3  . traMADol (ULTRAM) 50 MG tablet TAKE ONE TABLET EVERY 6 HOURS AS NEEDED FOR PAIN 30 tablet 0  . warfarin (COUMADIN) 5 MG tablet Take as directed by coumadin clinic 30 tablet 1  . zolpidem (AMBIEN) 5 MG tablet Take 1 tablet (5 mg total) by mouth at bedtime as needed for sleep. (Patient not taking: Reported on 02/23/2015) 20 tablet 0   No current facility-administered medications for this encounter.     ALLERGIES: Amoxicillin   LABORATORY DATA:  Lab Results  Component Value Date   WBC 4.5 02/14/2015   HGB 9.9* 02/14/2015   HCT 31.9* 02/14/2015   MCV 86.5 02/14/2015   PLT 160 02/14/2015   Lab Results  Component Value Date   NA 143 02/14/2015   K 3.8 02/14/2015   CL 106 01/10/2015   CO2 24 02/14/2015   Lab Results  Component Value Date   ALT 15 02/14/2015   AST 23 02/14/2015   ALKPHOS 121 02/14/2015   BILITOT 0.34 02/14/2015      NARRATIVE: Allison Mitchell was seen today for weekly treatment management. The chart was checked and the patient's films were reviewed.  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 The patient is doing exceptionally well at this time.  PHYSICAL EXAMINATION: weight is 217 lb 8 oz (98.657 kg). Her temperature is 98.4 F (36.9 C). Her blood pressure is 120/54 and her pulse is 84. Her oxygen saturation is 96%.        ASSESSMENT: The patient is doing satisfactorily with treatment.  PLAN: We will continue with the patient's radiation treatment as planned. I discussed with the patient that she will receive 28 fractions instead of 30 fractions. I will see her 1 month after she completes her radiation treatment.

## 2015-02-26 ENCOUNTER — Other Ambulatory Visit: Payer: Self-pay | Admitting: Hematology

## 2015-02-26 ENCOUNTER — Telehealth: Payer: Self-pay | Admitting: *Deleted

## 2015-02-26 ENCOUNTER — Ambulatory Visit
Admission: RE | Admit: 2015-02-26 | Discharge: 2015-02-26 | Disposition: A | Discharge status: Home / Self Care | Payer: BLUE CROSS/BLUE SHIELD | Source: Ambulatory Visit | Attending: Radiation Oncology | Admitting: Radiation Oncology

## 2015-02-26 DIAGNOSIS — C19 Malignant neoplasm of rectosigmoid junction: Secondary | ICD-10-CM | POA: Diagnosis not present

## 2015-02-26 NOTE — Telephone Encounter (Signed)
Pt called and left message for nurse re:  Pt will complete her radiation treatment on 4/29 as per Dr. Lisbeth Renshaw.  Pt was instructed by Dr. Lisbeth Renshaw to follow up with Dr. Burr Medico for additional chemo treatments. Confirmed with radiation nurse Dorothea Ogle that pt has last radiation treatment on 03/06/15.   Message to Dr. Burr Medico. Pt's  Phone    [phone].

## 2015-02-26 NOTE — Telephone Encounter (Signed)
Dr. Burr Medico sent POF for pt to follow up on 03/20/15.

## 2015-02-27 ENCOUNTER — Ambulatory Visit
Admission: RE | Admit: 2015-02-27 | Discharge: 2015-02-27 | Disposition: A | Discharge status: Home / Self Care | Payer: BLUE CROSS/BLUE SHIELD | Source: Ambulatory Visit | Attending: Radiation Oncology | Admitting: Radiation Oncology

## 2015-02-27 ENCOUNTER — Telehealth: Payer: Self-pay | Admitting: Hematology

## 2015-02-27 DIAGNOSIS — C19 Malignant neoplasm of rectosigmoid junction: Secondary | ICD-10-CM | POA: Diagnosis not present

## 2015-02-27 NOTE — Telephone Encounter (Signed)
lvm fo rpt regarding to May appt....mailed pt appt sched and letter

## 2015-02-28 ENCOUNTER — Ambulatory Visit
Admission: RE | Admit: 2015-02-28 | Discharge: 2015-02-28 | Disposition: A | Discharge status: Home / Self Care | Payer: BLUE CROSS/BLUE SHIELD | Source: Ambulatory Visit | Attending: Radiation Oncology | Admitting: Radiation Oncology

## 2015-02-28 DIAGNOSIS — C19 Malignant neoplasm of rectosigmoid junction: Secondary | ICD-10-CM | POA: Diagnosis not present

## 2015-03-01 ENCOUNTER — Ambulatory Visit
Admission: RE | Admit: 2015-03-01 | Discharge: 2015-03-01 | Disposition: A | Discharge status: Home / Self Care | Payer: BLUE CROSS/BLUE SHIELD | Source: Ambulatory Visit | Attending: Radiation Oncology | Admitting: Radiation Oncology

## 2015-03-01 DIAGNOSIS — C19 Malignant neoplasm of rectosigmoid junction: Secondary | ICD-10-CM | POA: Diagnosis not present

## 2015-03-01 NOTE — Progress Notes (Signed)
Weekly Management Note Current Dose:50.4 Gy  Projected Dose:50.4  Gy   Narrative:  The patient presents for routine under treatment assessment.  CBCT/MVCT images/Port film x-rays were reviewed.  The chart was checked. Finishes today. No nausea or pain. Bowels are stable.   Physical Findings:  Unchanged  Vitals: There were no vitals filed for this visit. Weight:  Wt Readings from Last 3 Encounters:  02/23/15 217 lb 8 oz (98.657 kg)  01/10/15 206 lb (93.441 kg)  01/04/15 208 lb 14.4 oz (94.756 kg)   Lab Results  Component Value Date   WBC 4.5 02/14/2015   HGB 9.9* 02/14/2015   HCT 31.9* 02/14/2015   MCV 86.5 02/14/2015   PLT 160 02/14/2015   Lab Results  Component Value Date   CREATININE 0.8 02/14/2015   BUN 9.5 02/14/2015   NA 143 02/14/2015   K 3.8 02/14/2015   CL 106 01/10/2015   CO2 24 02/14/2015     Impression:  The patient  Finishes RT today.   Plan: Follow up in 1 month. Has appt with Burr Medico in 2 weeks.

## 2015-03-02 ENCOUNTER — Ambulatory Visit: Payer: BLUE CROSS/BLUE SHIELD

## 2015-03-02 ENCOUNTER — Ambulatory Visit
Admission: RE | Admit: 2015-03-02 | Discharge: 2015-03-02 | Disposition: A | Discharge status: Home / Self Care | Payer: BLUE CROSS/BLUE SHIELD | Source: Ambulatory Visit | Attending: Radiation Oncology | Admitting: Radiation Oncology

## 2015-03-02 ENCOUNTER — Encounter: Payer: Self-pay | Admitting: Radiation Oncology

## 2015-03-02 VITALS — BP 130/80 | HR 80 | Temp 98.1°F | Resp 20 | Wt 216.3 lb

## 2015-03-02 DIAGNOSIS — C19 Malignant neoplasm of rectosigmoid junction: Secondary | ICD-10-CM

## 2015-03-02 DIAGNOSIS — Z923 Personal history of irradiation: Secondary | ICD-10-CM

## 2015-03-02 HISTORY — DX: Personal history of irradiation: Z92.3

## 2015-03-02 NOTE — Progress Notes (Signed)
Weekly rad xtxs completed 28/28 abdomen, no skin irritation,  No nausea, no gas no diarrhea, little fatigued, appetite good 1 month f/u appt given, chemotherapy every 2 weeks, on hold, appt Dr. Burr Medico 03/20/15 8:56 AM ,

## 2015-03-05 ENCOUNTER — Ambulatory Visit: Payer: BLUE CROSS/BLUE SHIELD

## 2015-03-06 ENCOUNTER — Ambulatory Visit: Payer: BLUE CROSS/BLUE SHIELD

## 2015-03-06 ENCOUNTER — Other Ambulatory Visit (HOSPITAL_BASED_OUTPATIENT_CLINIC_OR_DEPARTMENT_OTHER): Payer: BLUE CROSS/BLUE SHIELD

## 2015-03-06 ENCOUNTER — Ambulatory Visit (HOSPITAL_BASED_OUTPATIENT_CLINIC_OR_DEPARTMENT_OTHER): Payer: Self-pay | Admitting: Pharmacist

## 2015-03-06 ENCOUNTER — Other Ambulatory Visit (HOSPITAL_COMMUNITY)
Admission: AD | Admit: 2015-03-06 | Discharge: 2015-03-06 | Disposition: A | Discharge status: Home / Self Care | Payer: BLUE CROSS/BLUE SHIELD | Source: Ambulatory Visit | Attending: Hematology | Admitting: Hematology

## 2015-03-06 DIAGNOSIS — I2699 Other pulmonary embolism without acute cor pulmonale: Secondary | ICD-10-CM

## 2015-03-06 DIAGNOSIS — Z5181 Encounter for therapeutic drug level monitoring: Secondary | ICD-10-CM | POA: Insufficient documentation

## 2015-03-06 LAB — PROTIME-INR
INR: 3.6 — ABNORMAL HIGH (ref 0.00–1.49)
PROTHROMBIN TIME: 36.2 s — AB (ref 11.6–15.2)

## 2015-03-06 LAB — POCT INR: INR: 3.6

## 2015-03-06 NOTE — Progress Notes (Signed)
TELEPHONE ENCOUNTER ONLY - LAB WAS A SEND OUT  INR = 3.6     Goal 2-3 INR above goal range. INR was a send out today. Called patient at work with her results. No complications of anticoagulation noted. No new medications, although patient states she will be resuming chemotherapy soon (not yet scheduled). She completed XRT last week. She continues to have blood in her urine, but has had this since she had a urethral stent placed.. Her urologist has assured her that this is normal. She will have the stent replaced in June. We will decrease Coumadin to 5 mg (whole tablet) on Monday and Friday, 2.5 mg (half tablet) all other days.  She will return on 03/20/15 at 8:30am for lab, 8:45am for Coumadin clinic, and 9am for Dr. Burr Medico.  Theone Murdoch, PharmD

## 2015-03-12 NOTE — Telephone Encounter (Signed)
As long as the soma is functioning it is okay.  When is she due to see me?

## 2015-03-20 ENCOUNTER — Encounter: Payer: Self-pay | Admitting: Hematology

## 2015-03-20 ENCOUNTER — Ambulatory Visit (HOSPITAL_BASED_OUTPATIENT_CLINIC_OR_DEPARTMENT_OTHER): Payer: Self-pay | Admitting: Pharmacist

## 2015-03-20 ENCOUNTER — Other Ambulatory Visit (HOSPITAL_BASED_OUTPATIENT_CLINIC_OR_DEPARTMENT_OTHER): Payer: BLUE CROSS/BLUE SHIELD

## 2015-03-20 ENCOUNTER — Telehealth: Payer: Self-pay | Admitting: *Deleted

## 2015-03-20 ENCOUNTER — Telehealth: Payer: Self-pay | Admitting: Hematology

## 2015-03-20 ENCOUNTER — Ambulatory Visit (HOSPITAL_BASED_OUTPATIENT_CLINIC_OR_DEPARTMENT_OTHER): Payer: BLUE CROSS/BLUE SHIELD | Admitting: Hematology

## 2015-03-20 VITALS — BP 136/78 | HR 76 | Temp 98.4°F | Resp 17 | Ht 68.0 in | Wt 209.9 lb

## 2015-03-20 DIAGNOSIS — C187 Malignant neoplasm of sigmoid colon: Secondary | ICD-10-CM

## 2015-03-20 DIAGNOSIS — C180 Malignant neoplasm of cecum: Secondary | ICD-10-CM | POA: Diagnosis not present

## 2015-03-20 DIAGNOSIS — C182 Malignant neoplasm of ascending colon: Secondary | ICD-10-CM | POA: Diagnosis not present

## 2015-03-20 DIAGNOSIS — I2699 Other pulmonary embolism without acute cor pulmonale: Secondary | ICD-10-CM

## 2015-03-20 DIAGNOSIS — C189 Malignant neoplasm of colon, unspecified: Secondary | ICD-10-CM

## 2015-03-20 DIAGNOSIS — G629 Polyneuropathy, unspecified: Secondary | ICD-10-CM

## 2015-03-20 DIAGNOSIS — C19 Malignant neoplasm of rectosigmoid junction: Secondary | ICD-10-CM

## 2015-03-20 DIAGNOSIS — N9489 Other specified conditions associated with female genital organs and menstrual cycle: Secondary | ICD-10-CM

## 2015-03-20 DIAGNOSIS — R5383 Other fatigue: Secondary | ICD-10-CM

## 2015-03-20 DIAGNOSIS — D500 Iron deficiency anemia secondary to blood loss (chronic): Secondary | ICD-10-CM

## 2015-03-20 LAB — COMPREHENSIVE METABOLIC PANEL (CC13)
ALT: 16 U/L (ref 0–55)
AST: 22 U/L (ref 5–34)
Albumin: 3.4 g/dL — ABNORMAL LOW (ref 3.5–5.0)
Alkaline Phosphatase: 114 U/L (ref 40–150)
Anion Gap: 13 mEq/L — ABNORMAL HIGH (ref 3–11)
BILIRUBIN TOTAL: 0.4 mg/dL (ref 0.20–1.20)
BUN: 10.7 mg/dL (ref 7.0–26.0)
CO2: 22 mEq/L (ref 22–29)
Calcium: 8.8 mg/dL (ref 8.4–10.4)
Chloride: 109 mEq/L (ref 98–109)
Creatinine: 0.9 mg/dL (ref 0.6–1.1)
EGFR: 76 mL/min/{1.73_m2} — ABNORMAL LOW (ref >90)
GLUCOSE: 92 mg/dL (ref 70–140)
Potassium: 3.7 mEq/L (ref 3.5–5.1)
Sodium: 145 mEq/L (ref 136–145)
Total Protein: 6.2 g/dL — ABNORMAL LOW (ref 6.4–8.3)

## 2015-03-20 LAB — CBC WITH DIFFERENTIAL/PLATELET
BASO%: 1.5 % (ref 0.0–2.0)
BASOS ABS: 0 10*3/uL (ref 0.0–0.1)
EOS%: 2.7 % (ref 0.0–7.0)
Eosinophils Absolute: 0.1 10*3/uL (ref 0.0–0.5)
HCT: 29 % — ABNORMAL LOW (ref 34.8–46.6)
HEMOGLOBIN: 9.2 g/dL — AB (ref 11.6–15.9)
LYMPH%: 14.6 % (ref 14.0–49.7)
MCH: 26.6 pg (ref 25.1–34.0)
MCHC: 31.6 g/dL (ref 31.5–36.0)
MCV: 84.1 fL (ref 79.5–101.0)
MONO#: 0.4 10*3/uL (ref 0.1–0.9)
MONO%: 11.1 % (ref 0.0–14.0)
NEUT#: 2.3 10*3/uL (ref 1.5–6.5)
NEUT%: 70.1 % (ref 38.4–76.8)
Platelets: 237 10*3/uL (ref 145–400)
RBC: 3.45 10*6/uL — AB (ref 3.70–5.45)
RDW: 22.9 % — ABNORMAL HIGH (ref 11.2–14.5)
WBC: 3.3 10*3/uL — ABNORMAL LOW (ref 3.9–10.3)
lymph#: 0.5 10*3/uL — ABNORMAL LOW (ref 0.9–3.3)

## 2015-03-20 LAB — CEA: CEA: 0.6 ng/mL (ref 0.0–5.0)

## 2015-03-20 LAB — PROTIME-INR
INR: 1.8 — AB (ref 2.00–3.50)
Protime: 21.6 Seconds — ABNORMAL HIGH (ref 10.6–13.4)

## 2015-03-20 LAB — POCT INR: INR: 1.8

## 2015-03-20 MED ORDER — WARFARIN SODIUM 5 MG PO TABS: ORAL_TABLET | ORAL | Status: DC

## 2015-03-20 MED ORDER — ZOLPIDEM TARTRATE 5 MG PO TABS: 5.0000 mg | ORAL_TABLET | Freq: Every evening | ORAL | Status: DC | PRN

## 2015-03-20 NOTE — Patient Instructions (Addendum)
Increase Coumadin to 5 mg (whole tablet) on Monday, Wednesday and Friday, 2.5 mg (half tablet) all other days.  Return on 03/28/15 at 11:45am for lab, 12:00pm for Coumadin clinic and 12:15 for MD appmt.

## 2015-03-20 NOTE — Progress Notes (Signed)
[name]  [phone]) [phone] Fax:([phone]) [place] Note   Patient Care Team: [name], MD as PCP - General (Family Medicine) 03/20/2015  CHIEF COMPLAINTS/PURPOSE OF CONSULTATION:  Synchronized three colon cancer, stage III    Colorectal cancer, stage III   10/04/2014 Tumor Marker CEA 1.0 .  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 mpT4aN2aM0,stage IIIC.    10/10/2014 Surgery partial right hemicolectomy with removal of terminal ileum, sigmoid colectomy, right ureter lysis, (+) residual surgical margin    11/23/2014 - 01/06/2015 Chemotherapy FOLFOX every 2 weeks for 4 cycles    01/23/2015 - 03/02/2015 Radiation Therapy adjuvant radiation   OTHER ISSUES: 1. Pulmonary embolism in segment omental branches of the right lung, diagnosed on 11/28/2014 2. Right ureter stent placed on 10/20/2014  CURRENT THERAPY: FOLFOX every 2 weeks, started on 11/23/2014, plan for 12 cycles (with radiation in the middle).   HISTORY OF PRESENTING ILLNESS:  Allison Mitchell 54 y.o. female is here for follow-up after her hospital discharge. She was recently diagnosed with 3 synchronous to colon cancer, status post surgical resection. I initially saw her when she was diagnosed in the hospital.  She presented with some GI symptoms since last October 2014 at which time she was diagnosed with ischemic colitis. Due to guaiac-positive stools, patient had undergone an outpatient colonoscopy on 09/26/2014, which showed inflamed and ulcerated mucosa in the terminal ileum, and a 1.5 cm mass in the ascending colon and additional 1.5 cm mass in the mid sigmoid colon. All biopsy from the above 3 sites came back positive for invasive moderate differentiated adenocarcinoma, and the sigmoid mass containing squamous cell  differentiation.   She was hospitalized for worsening of her symptoms on 10/03/2014.  A CT of the abdomen and pelvis with contrast on 10/02/2014 was remarkable for soft tissue/bowel wall thickening of the right lower pelvis center at the cecum-terminal ileum involving a portion of the adjacent sigmoid colon. This causes small bowel obstruction and obstructed the distal right ureter causing moderate to severe right hydroureteronephrosis. She was [facility] to [facility] for surgical management and further testing. CT of the chest with contrast on 10/03/2014 negative for metastatic disease. MRI of the liver without and without contrast on 10/03/2014 is remarkable for small hepatic cysts, but no findings suspicious for hepatic metastatic disease. Right-sided hydroureteronephrosis was once again seen, with early small bowel obstruction bowel gas pattern.  She underwent right hemicolectomy with primary anastomosis with sigmoid colectomy with end colostomy on Friday, Dec 5, after cystoscopy is performed on 12/4 to rule out GU invasion and a ureteral stent placement.  She has family history of colon cancer in her grandmother and polyps in her sister. Denies risk factors for HIV or hepatitis. Denies Diabetes. No Tobacco. No ETOH. Denies prior radiation. SHe did consume significant amount of red meat until recently. We are asked to see the patient in consultation, with recommendations on the oncology standpoint  She is recovering slowly after the surgery. She had the surgical suture opened about 3 weeks ago and was treated for antibiotics , and now much improved. She has no significant pain, but mild abdominal discomfort, no nausea, her stool is getting thicker, she clean her colostomy bag daily. She was seen by Dr. Hulen Skains this morning. She noticed some cloudy urine in  the past few days, with mild burning sensation, no fever or back pain. Her appetite is getting better, eats well.  She lost  about 25 lbs. No fever or chills. No other new complains.   INTERIM HISTORY: [name] returns for follow up. She finished RT on 4/29 and she tolerated it well overall. She has been having urinary urgency and frenquency, and pain since her stent placement, she follows up with her urologist Dr. Tresa Moore, who recommends to remove his stent after she completes chemoradiation. She otherwise feels well, she has fare appetite and eats well. She he is on Coumadin and follow up with our Coumadin clinic.  MEDICAL HISTORY:  Past Medical History  Diagnosis Date  . GERD (gastroesophageal reflux disease)   . Depression   . Anxiety   . 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 Peripheral neuropathy due to chemotherapy   . Hydronephrosis, right     malignant--  s/p reimiplant right ureter  . History of pulmonary embolus (PE)     11-28-2014--  due to chemotherapy on coumadin  . Anticoagulated on Coumadin   . Colorectal cancer, stage III oncologist--  dr Stefani Dama    Dx  10-10-2014---Stage IIIC, mpT4aN2aM0, Multifocal synchronous, Moderate differentiated Invasive, Mets to nodes (5 out of 17)/  s/p  Resection tumor cecum, terminal ileum, sigmoid/  currently chemoradiation therapy  . Dysuria     SURGICAL HISTORY: Past Surgical History  Procedure Laterality Date  . Cystoscopy with retrograde pyelogram, ureteroscopy and stent placement Right 10/05/2014    Procedure: [place], URETEROSCOPY  AND STENT PLACEMENT;  Surgeon: Alexis Frock, MD;  Location: [facility];  Service: Urology;  Laterality: Right;  . Laparotomy N/A 10/10/2014    Procedure: EXPLORATORY LAPAROTOMY;  Surgeon: Doreen Salvage, MD;  Location: [place];  Service: General;  Laterality: N/A;  . Partial colectomy  10/10/2014    Procedure: PARTIAL SIGMOID COLECTOMY;  Surgeon: Doreen Salvage, MD;  Location: [place];  Service: General;;  . Colostomy  10/10/2014    Procedure: COLOSTOMY;  Surgeon: Doreen Salvage, MD;  Location: [place];   Service: General;;  . Colostomy revision  10/10/2014    Procedure: PARTIAL RIGHT COLECTOMY;  Surgeon: Doreen Salvage, MD;  Location: [place];  Service: General;;  . Ureteral reimplantion Right 10/10/2014    Procedure: OPEN RIGHT URETERAL LYSIS ;  Surgeon: Alexis Frock, MD;  Location: [place];  Service: Urology;  Laterality: Right;  . Portacath placement N/A 10/17/2014    Procedure: INSERTION PORT-A-CATH LEFT SUBCLAVIAN AND MIDLINE INCISION STAPLE REMOVAL ;  Surgeon: Coralie Keens, MD;  Location: [place];  Service: General;  Laterality: N/A;  . Cystoscopy w/ retrogrades Right 01/10/2015    Procedure: CYSTOSCOPY WITH RETROGRADE PYELOGRAM;  Surgeon: Alexis Frock, MD;  Location: [facility];  Service: Urology;  Laterality: Right;  . Cystoscopy w/ ureteral stent placement Right 01/10/2015    Procedure: CYSTOSCOPY WITH STENT REPLACEMENT;  Surgeon: Alexis Frock, MD;  Location: [facility];  Service: Urology;  Laterality: Right;    SOCIAL HISTORY: History   Social History  . Marital Status: Married    Spouse Name: N/A  . Number of Children: N/A  . Years of Education: N/A   Occupational History  . Not on file.   Social History Main Topics  . Smoking status: Never Smoker   . Smokeless tobacco: Never Used  . Alcohol Use: No  . Drug Use: No  . Sexual Activity: No  Other Topics Concern  . Not on file   Social History Narrative    FAMILY HISTORY: Family History  Problem Relation Age of Onset  . Cancer Mother     skin cancer   . Diabetes Father   . CAD Father     Onset in his 55s, has 7 stents  . Cancer Maternal Grandmother 61    colon cancer   . Cancer Paternal Grandmother 34    breast cancer     ALLERGIES:  is allergic to amoxicillin.  MEDICATIONS:  Current Outpatient Prescriptions  Medication Sig Dispense Refill  . citalopram (CELEXA) 20 MG tablet Take 20-40 mg by mouth daily.   1  . hyaluronate sodium (RADIAPLEXRX) GEL Apply 1 application  topically daily. Apply to abdomen  treated area after rad tx daily    . lidocaine-prilocaine (EMLA) cream Apply 1 application topically as needed. 30 g 0  . magnesium hydroxide (MILK OF MAGNESIA) 400 MG/5ML suspension Take 5 mLs by mouth daily as needed for mild constipation.    . Meth-Hyo-M Bl-Na Phos-Ph Sal (URIBEL) 118 MG CAPS Take 1 capsule by mouth as needed. Every 8 hours as needed  11  . omeprazole (PRILOSEC) 40 MG capsule Take 40 mg by mouth every morning.  12  . ondansetron (ZOFRAN) 8 MG tablet Take 1 tablet (8 mg total) by mouth every 8 (eight) hours as needed for nausea or vomiting. 20 tablet 3  . traMADol (ULTRAM) 50 MG tablet TAKE ONE TABLET EVERY 6 HOURS AS NEEDED FOR PAIN 30 tablet 0  . warfarin (COUMADIN) 5 MG tablet Take as directed by coumadin clinic 30 tablet 3  . zolpidem (AMBIEN) 5 MG tablet Take 1 tablet (5 mg total) by mouth at bedtime as needed for sleep. 20 tablet 0  . zolpidem (AMBIEN) 5 MG tablet Take 1 tablet (5 mg total) by mouth at bedtime as needed for sleep. 30 tablet 0   No current facility-administered medications for this visit.    REVIEW OF SYSTEMS:   Constitutional: Denies fevers, chills or abnormal night sweats Eyes: Denies blurriness of vision, double vision or watery eyes Ears, nose, mouth, throat, and face: Denies mucositis or sore throat Respiratory: Denies cough, dyspnea or wheezes Cardiovascular: Denies palpitation, chest discomfort or lower extremity swelling Gastrointestinal:  Denies nausea, heartburn or change in bowel habits Skin: Denies abnormal skin rashes Lymphatics: Denies new lymphadenopathy or easy bruising Neurological:Denies numbness, tingling or new weaknesses Behavioral/Psych: Mood is stable, no new changes  All other systems were reviewed with the patient and are negative.  PHYSICAL EXAMINATION: ECOG PERFORMANCE STATUS: 1 - Symptomatic but completely ambulatory  Filed Vitals:   03/20/15 0905  BP: 136/78  Pulse: 76  Temp:  98.4 F (36.9 C)  Resp: 17   Filed Weights   03/20/15 0905  Weight: 209 lb 14.4 oz (95.21 kg)    GENERAL:alert, no distress and comfortable SKIN: skin color, texture, turgor are normal, no rashes or significant lesions EYES: normal, conjunctiva are pink and non-injected, sclera clear OROPHARYNX:no exudate, no erythema and lips, buccal mucosa, and tongue normal  NECK: supple, thyroid normal size, non-tender, without nodularity LYMPH:  no palpable lymphadenopathy in the cervical, axillary or inguinal LUNGS: clear to auscultation and percussion with normal breathing effort HEART: regular rate & rhythm and no murmurs and no lower extremity edema ABDOMEN:abdomen soft, non-tender and normal bowel sounds. Positive for colostomy bag on the left, with normal-appearing stool. Surgical wound upper part is well-healed. Musculoskeletal:no cyanosis of digits and  no clubbing  PSYCH: alert & oriented x 3 with fluent speech NEURO: no focal motor/sensory deficits  LABORATORY DATA:  CBC Latest Ref Rng 03/20/2015 02/14/2015 01/24/2015  WBC 3.9 - 10.3 10e3/uL 3.3(L) 4.5 6.9  Hemoglobin 11.6 - 15.9 g/dL 9.2(L) 9.9(L) 9.9(L)  Hematocrit 34.8 - 46.6 % 29.0(L) 31.9(L) 31.7(L)  Platelets 145 - 400 10e3/uL 237 160 175    CMP Latest Ref Rng 03/20/2015 02/14/2015 01/24/2015  Glucose 70 - 140 mg/dl 92 119 113  BUN 7.0 - 26.0 mg/dL 10.7 9.5 10.1  Creatinine 0.6 - 1.1 mg/dL 0.9 0.8 0.8  Sodium 136 - 145 mEq/L 145 143 142  Potassium 3.5 - 5.1 mEq/L 3.7 3.8 3.6  Chloride 96 - 112 mmol/L - - -  CO2 22 - 29 mEq/L _0 Calcium 8.4 - 10.4 mg/dL 8.8 8.3(L) 8.7  Total Protein 6.4 - 8.3 g/dL 6.2(L) 6.2(L) 6.1(L)  Total Bilirubin 0.20 - 1.20 mg/dL 0.40 0.34 0.34  Alkaline Phos 40 - 150 U/L 114 121 139  AST 5 - 34 U/L _1 ALT 0 - 55 U/L _2 Surgical path 10/10/2014 1. Colon, segmental resection for tumor, Cecum, terminal ileum, sigmoid colon - MULTIFOCAL INVASIVE ADENOCARCINOMA, MODERATELY  DIFFERENTIATED, THE LARGEST FOCUS SPANS 4.0 CM. - ADENOCARCINOMA INVOLVES SEROSA. - LYMPHOVASCULAR INVASION IS IDENTIFIED. - METASTATIC CARCINOMA IN 5 OF 17 LYMPH NODES (5/17). - THE PROXIMAL AND DISTAL SURGICAL RESECTION MARGINS ARE NEGATIVE FOR ADENOCARCINOMA. - SEE ONCOLOGY TABLE BELOW. 2. Soft tissue, biopsy, Right periureteral tissue - ADENOCARCINOMA. 3. Soft tissue, biopsy, Right periureteral tissue - INFLAMED AND DEGENERATING FIBROADIPOSE TISSUE. - THERE IS NO EVIDENCE OF MALIGNANCY. Microscopic Comment 1. COLON AND RECTUM (INCLUDING TRANS-ANAL RESECTION): Specimen: Distal ileum, cecum, proximal ascending colon, and adherent sigmoid colon. Procedure: Resection. Tumor site: Multiple foci, including ileocecal valve, ascending colon and sigmoid. Specimen integrity: Disrupted. Macroscopic intactness of mesorectum: N/A Macroscopic tumor perforation: Present. Invasive tumor: Maximum size: 4.0 cm. Histologic type(s): Adenocarcinoma. Histologic grade and differentiation: G2: moderately differentiated Type of polyp in which invasive carcinoma arose: Tubulovillous adenomas. Microscopic extension of invasive tumor: Focally involves the serosa. Lymph-Vascular invasion: Present. Peri-neural invasion: Not identified. Tumor deposit(s) (discontinuous extramural extension): Not identified. Resection margins: Proximal margin: 4.0 cm Distal margin: 3.5 cm Circumferential (radial) (posterior ascending, posterior descending; lateral and posterior mid-rectum; and entire lower 1/3 rectum): Cannot be assessed. Mesenteric margin (sigmoid and transverse): Cannot be assessed. Treatment effect (neo-adjuvant therapy): N/A Additional polyp(s): Not identified. Non-neoplastic findings: No significant findings. Lymph nodes: number examined 17; number positive: 5 Pathologic Staging: mpT4a, pN2a, pMX Ancillary studies: A block will be sent for MSI testing by [facility] and MSI testing by PCR and the  results reported separately. Comment: There appear to be three synchronous primary tumors in the specimen, one at the ileocecal valve, one in the ascending colon, and one at the sigmoid colon. Each of these foci contain an associated tubulovillous adenoma, suggesting three separate primary sites. The tumor in the sigmoid colon appears to involve the serosa as well. The other two tumors show extension into at least the pericolonic soft tissue. Dr Mali Rund has reviewed selected slides and concurs that there are likely three synchronous tumors here. The case was discussed with Dr Hulen Skains. Per Dr Hulen Skains, the specimen in #2 is a contiguous piece of tissue 2 of 4 Amended copy Amended FINAL for MERRILL, DEANDA [id number]) Microscopic Comment(continued) with the main tumor, and therefore, not considered a distant metastatic focus.  Therefore, the tumor is staged as an MX. (JBK:ecj 10/16/2014) JOSHUA  Mismatch Repair (MMR) Protein Immunohistochemistry (IHC) IHC Expression Result: MLH1: Preserved nuclear expression (greater 50% tumor expression) MSH2: Preserved nuclear expression (greater 50% tumor expression) MSH6: Preserved nuclear expression (greater 50% tumor expression) PMS2: Preserved nuclear expression (greater 50% tumor expression) * Internal control demonstrates intact nuclear expression Interpretation: NORMAL There is preserved expression  RADIOGRAPHIC STUDIES: I have personally reviewed the radiological images as listed and agreed with the findings in the report.  CT of abdomen and pelvis 11/14/2014 New complex fluid collection in the right pelvis measuring 2.9 x 5.9cm. Postop abscess cannot be excluded.  Decrease right hydronephrosis with ureteral stent in appropriate position.  No evidence of abdominal or pelvic metastatic disease.   ASSESSMENT & PLAN:  54 year old Caucasian female with minimal past medical history, who was found to have 3 multifocal colon cancer at  terminal ileum/cecum, ascending colon and sigmoid colon. All of them has adenocarcinoma,  And the cecum tumor has squamous differentiation. The cecum tumor has directly invaded the soft tissue along the right ureter, which was not completely resected (1% tumor left over, per Dr. Hulen Skains). She has mpT4aN2aM0 stage IIIB, MSI/MMR normal.   1. Multifocal stage IIIB colon cancer, MMR normal, MSI stable   -I discussed her surgical past findings extensively with patient and her husband. Unfortunately, the tumor was not completely resected. She has very small amount of tumor left over. -Given her residual tumor after surgery, advanced stage III disease, high risk recurrence in the future, I strongly recommend adjuvant chemotherapy with the standard regimen FOLFOX. Side effects were discussed with patient in great detail. She agrees to proceed. -I'll discuss with his radiation oncologist Dr. Lisbeth Renshaw about the case sequence of chemotherapy and irradiation. Both of [place] agreed to start chemotherapy for a few months first, then start radiation with or without sensitizing chemotherapy 5-FU, , then finish the rest of the adjuvant chemotherapy for total of 6 months. -she is now finished radiation, I recommended to restart chemotherapy next week.  -we'll obtain a repeat his CT scan   2. PE -She was on Lovenox injection for a month then switched to oral anticoagulant Coumadin.  -Follow-up with Coumadin clinic.   3. Right pelvic cyst on CT 11/14/2014 -This is likely a seroma secondary to surgery. Giving her afebrile, normal white count, no abdominal pain or tenderness, this is unlikely a abscess. -I have discussed with her surgeon Dr. Hulen Skains, he agrees with close monitoring. -repeat a CT scan next week  3. right ureter stent placement -She will follow up with her urologist.  -her urinary symptoms likely related to the stent, previous multiple culture were negative. - okay to take Tylenol for pain.  5. 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 Her iron study is consistent with iron deficient anemia -This is secondary to the bleeding from her colon cancer. -She received IV Feraheme 547m twice.   6.G1 peripheral neuropathy, fatigue  -Secondary to chemotherapy, improved  -Continue close monitoring  Plan, -restart chemotherapy FOLFOX on May 26 -restaging CT chest abdomen and pelvis next week   All questions were answered. The patient knows to call the clinic with any problems, questions or concerns.  I spent 20 minutes counseling the patient face to face. The total time spent in the appointment was 25 minutes and more than 50% was on counseling.     FTruitt Merle MD 03/20/2015 4:54 PM

## 2015-03-20 NOTE — Telephone Encounter (Signed)
adv pt [facility] would be calling to sch [facility]

## 2015-03-20 NOTE — Telephone Encounter (Signed)
I have scheduled appts for the [facility] office. They will call and give the patient his appts.   JMW

## 2015-03-20 NOTE — Telephone Encounter (Signed)
per pof to sch pt appt-gave pt copy of sch-sent MW email to sch trmt °

## 2015-03-20 NOTE — Progress Notes (Signed)
Pt seen prior to MD appmt today INR=1.8 No new meds or changes to report Finished radiation  Increase Coumadin to 5 mg (whole tablet) on Monday, Wednesday and Friday, 2.5 mg (half tablet) all other days.  Return on 03/28/15 at 11:45am for lab, 12:00pm for Coumadin clinic and 12:15 for MD appmt.

## 2015-03-20 NOTE — Telephone Encounter (Signed)
Per staff message and POF I have scheduled appts. Advised scheduler of appts. JMW  

## 2015-03-20 NOTE — Telephone Encounter (Signed)
Last enrty on wrong pt

## 2015-03-26 ENCOUNTER — Telehealth: Payer: Self-pay | Admitting: Hematology

## 2015-03-26 NOTE — Telephone Encounter (Signed)
per Kolleen to sch CC-pt aware °

## 2015-03-27 ENCOUNTER — Ambulatory Visit (HOSPITAL_COMMUNITY)
Admission: RE | Admit: 2015-03-27 | Discharge: 2015-03-27 | Disposition: A | Discharge status: Home / Self Care | Payer: BLUE CROSS/BLUE SHIELD | Source: Ambulatory Visit | Attending: Hematology | Admitting: Hematology

## 2015-03-27 ENCOUNTER — Encounter: Payer: Self-pay | Admitting: Radiation Oncology

## 2015-03-27 ENCOUNTER — Encounter (HOSPITAL_COMMUNITY): Payer: Self-pay

## 2015-03-27 DIAGNOSIS — I2699 Other pulmonary embolism without acute cor pulmonale: Secondary | ICD-10-CM

## 2015-03-27 DIAGNOSIS — Z933 Colostomy status: Secondary | ICD-10-CM | POA: Insufficient documentation

## 2015-03-27 DIAGNOSIS — C19 Malignant neoplasm of rectosigmoid junction: Secondary | ICD-10-CM | POA: Insufficient documentation

## 2015-03-27 MED ORDER — IOHEXOL 300 MG/ML  SOLN
100.0000 mL | Freq: Once | INTRAMUSCULAR | Status: AC | PRN
  Administered 2015-03-27: 100 mL via INTRAVENOUS

## 2015-03-28 ENCOUNTER — Ambulatory Visit (HOSPITAL_BASED_OUTPATIENT_CLINIC_OR_DEPARTMENT_OTHER): Payer: BLUE CROSS/BLUE SHIELD | Admitting: Nurse Practitioner

## 2015-03-28 ENCOUNTER — Other Ambulatory Visit (HOSPITAL_BASED_OUTPATIENT_CLINIC_OR_DEPARTMENT_OTHER): Payer: BLUE CROSS/BLUE SHIELD

## 2015-03-28 ENCOUNTER — Ambulatory Visit: Payer: BLUE CROSS/BLUE SHIELD | Admitting: Pharmacist

## 2015-03-28 ENCOUNTER — Telehealth: Payer: Self-pay | Admitting: Hematology

## 2015-03-28 VITALS — BP 156/81 | HR 82 | Temp 98.5°F | Resp 18 | Ht 68.0 in | Wt 211.5 lb

## 2015-03-28 DIAGNOSIS — I2699 Other pulmonary embolism without acute cor pulmonale: Secondary | ICD-10-CM

## 2015-03-28 DIAGNOSIS — C180 Malignant neoplasm of cecum: Secondary | ICD-10-CM

## 2015-03-28 DIAGNOSIS — R229 Localized swelling, mass and lump, unspecified: Secondary | ICD-10-CM

## 2015-03-28 DIAGNOSIS — C182 Malignant neoplasm of ascending colon: Secondary | ICD-10-CM

## 2015-03-28 DIAGNOSIS — C787 Secondary malignant neoplasm of liver and intrahepatic bile duct: Secondary | ICD-10-CM

## 2015-03-28 DIAGNOSIS — K769 Liver disease, unspecified: Secondary | ICD-10-CM | POA: Diagnosis not present

## 2015-03-28 DIAGNOSIS — C189 Malignant neoplasm of colon, unspecified: Secondary | ICD-10-CM

## 2015-03-28 DIAGNOSIS — C19 Malignant neoplasm of rectosigmoid junction: Secondary | ICD-10-CM

## 2015-03-28 DIAGNOSIS — D509 Iron deficiency anemia, unspecified: Secondary | ICD-10-CM

## 2015-03-28 DIAGNOSIS — G622 Polyneuropathy due to other toxic agents: Secondary | ICD-10-CM

## 2015-03-28 LAB — PROTIME-INR
INR: 2.2 (ref 2.00–3.50)
Protime: 26.4 Seconds — ABNORMAL HIGH (ref 10.6–13.4)

## 2015-03-28 LAB — COMPREHENSIVE METABOLIC PANEL (CC13)
ALBUMIN: 3.5 g/dL (ref 3.5–5.0)
ALK PHOS: 110 U/L (ref 40–150)
ALT: 14 U/L (ref 0–55)
AST: 21 U/L (ref 5–34)
Anion Gap: 12 mEq/L — ABNORMAL HIGH (ref 3–11)
BILIRUBIN TOTAL: 0.29 mg/dL (ref 0.20–1.20)
BUN: 14.4 mg/dL (ref 7.0–26.0)
CALCIUM: 8.2 mg/dL — AB (ref 8.4–10.4)
CO2: 20 mEq/L — ABNORMAL LOW (ref 22–29)
Chloride: 110 mEq/L — ABNORMAL HIGH (ref 98–109)
Creatinine: 0.8 mg/dL (ref 0.6–1.1)
EGFR: 81 mL/min/{1.73_m2} — ABNORMAL LOW (ref >90)
Glucose: 102 mg/dl (ref 70–140)
POTASSIUM: 3.8 meq/L (ref 3.5–5.1)
SODIUM: 142 meq/L (ref 136–145)
Total Protein: 6.3 g/dL — ABNORMAL LOW (ref 6.4–8.3)

## 2015-03-28 LAB — CBC WITH DIFFERENTIAL/PLATELET
BASO%: 1.5 % (ref 0.0–2.0)
Basophils Absolute: 0.1 10*3/uL (ref 0.0–0.1)
EOS ABS: 0.1 10*3/uL (ref 0.0–0.5)
EOS%: 2.4 % (ref 0.0–7.0)
HCT: 27.6 % — ABNORMAL LOW (ref 34.8–46.6)
HGB: 8.8 g/dL — ABNORMAL LOW (ref 11.6–15.9)
LYMPH%: 18.4 % (ref 14.0–49.7)
MCH: 26.4 pg (ref 25.1–34.0)
MCHC: 32.1 g/dL (ref 31.5–36.0)
MCV: 82.2 fL (ref 79.5–101.0)
MONO#: 0.4 10*3/uL (ref 0.1–0.9)
MONO%: 10.2 % (ref 0.0–14.0)
NEUT#: 2.5 10*3/uL (ref 1.5–6.5)
NEUT%: 67.5 % (ref 38.4–76.8)
PLATELETS: 230 10*3/uL (ref 145–400)
RBC: 3.35 10*6/uL — ABNORMAL LOW (ref 3.70–5.45)
RDW: 21.9 % — ABNORMAL HIGH (ref 11.2–14.5)
WBC: 3.6 10*3/uL — AB (ref 3.9–10.3)
lymph#: 0.7 10*3/uL — ABNORMAL LOW (ref 0.9–3.3)

## 2015-03-28 LAB — POCT INR: INR: 2.2

## 2015-03-28 NOTE — Progress Notes (Addendum)
[place] OFFICE PROGRESS NOTE   Diagnosis:  Colon cancer Colorectal cancer, stage III   10/04/2014 Tumor Marker CEA 1.0 . tumor MSI stable and MMR normal    10/10/2014 Initial Diagnosis Colon cancer   10/10/2014 Pathologic Stage mutifocal (3) colon adencarcinoma (one with squamous differentiation) at cecum, ascending colon and sigmoid colon. [id number],stage IIIC.    10/10/2014 Surgery partial right hemicolectomy with removal of terminal ileum, sigmoid colectomy, right ureter lysis, (+) residual surgical margin    11/23/2014 - 01/06/2015 Chemotherapy FOLFOX every 2 weeks for 4 cycles    01/23/2015 - 03/02/2015 Radiation Therapy adjuvant radiation        INTERVAL HISTORY:   Allison Mitchell returns as scheduled. She overall is feeling well. She has a good appetite. No nausea or vomiting. No mouth sores. Ostomy is functioning well. She intermittently notes blood in Allison urine. She relates this to the stent. No numbness or tingling in Allison hands or feet. She periodically notes that Allison feet feel "hot" at bedtime. She continues Coumadin. No shortness of breath or cough. No fever.  Objective:  Vital signs in last 24 hours:  Blood pressure 156/81, pulse 82, temperature 98.5 F (36.9 C), temperature source Oral, resp. rate 18, height 5' 8"  (1.727 m), weight 211 lb 8 oz (95.936 kg), SpO2 100 %.    HEENT: No thrush or ulcers. Resp: Lungs clear bilaterally. Cardio: Regular rate and rhythm. GI: Abdomen soft and nontender. No hepatomegaly. Left lower quadrant colostomy. Vascular: No leg edema.  Skin: No rash. Port-A-Cath without erythema.    Lab Results:  Lab Results  Component Value Date   WBC 3.6* 03/28/2015   HGB 8.8* 03/28/2015   HCT 27.6* 03/28/2015   MCV 82.2 03/28/2015   PLT 230 03/28/2015   NEUTROABS 2.5 03/28/2015    Imaging:  Ct Chest W Contrast  03/27/2015   CLINICAL DATA:  Stage III colorectal cancer, resection and colostomy. Right  ureteral stent placed. Radiation therapy completed. Ongoing chemotherapy. Pulmonary embolus on 11/28/2013  EXAM: CT CHEST, ABDOMEN, AND PELVIS WITH CONTRAST  TECHNIQUE: Multidetector CT imaging of the chest, abdomen and pelvis was performed following the standard protocol during bolus administration of intravenous contrast.  CONTRAST:  135m OMNIPAQUE IOHEXOL 300 MG/ML  SOLN  COMPARISON:  Multiple exams, including 11/28/2014 and 11/14/2014  FINDINGS: CT CHEST FINDINGS  Mediastinum/Nodes: Minimal atherosclerotic calcification of the aortic arch.  Contrast medium is present in the esophagus, probably reflecting gastroesophageal reflux.  No pathologic thoracic adenopathy. No large clot in the pulmonary arterial tree, although today' s exam was not protocol does a pulmonary embolus exam and accordingly is not sensitive for smaller amounts of clot.  Lungs/Pleura: Unremarkable  Musculoskeletal: Unremarkable  CT ABDOMEN PELVIS FINDINGS  Hepatobiliary: Stable peripheral 4 mm hypodensity in segment 7 of the liver, image 50 series 2. A second 4 mm hypodense lesion is present posteriorly in segment 7 on that same image.  There is a new subcapsular or capsular nodular density along the posterolateral margin of segment 6 measuring 2.1 by 1.1 cm on image 61 of series 2.  Stable 4 mm hypodense lesion in segment 4A of the liver, image 53 series 2.  Pancreas: Unremarkable  Spleen: Unremarkable  Adrenals/Urinary Tract: Right hydronephrosis and hydroureter noted. Right double-J ureteral stent in place. There is symmetric enhancement between the right and left kidney. Periureteral stranding is noted on the right, especially proximally. Fifth I cannot exclude ureteral wall thickening on the right.  Stomach/Bowel: Small type 1  hiatal hernia. Prominence of stool in the colon extending to the colostomy site. Postoperative findings along the cecum. Mild wall thickening throughout the rectal pouch.  Vascular/Lymphatic: Aortoiliac  atherosclerotic vascular disease. Small peripancreatic lymph nodes are not pathologically enlarged by size criteria.  Reproductive: Serpentine fluid density along the right adnexa possibly from hydrosalpinx.  Other: New 0.6 by 0.9 cm right omental nodule, image 80 series 2.  Musculoskeletal: Segment and right transverse process at L2. Segmental S1 vertebra. Grade 1 anterolisthesis at L5-S1 without impingement or pars defects.  IMPRESSION: 1. Suspicion for new peritoneal metastatic disease including a 2.1 by 1.1 cm capsular nodule along the posterior margin of hepatic segment 6, and a new small right anterior omental nodule. 2. Several additional hypodense tiny lesions in the liver are stable. 3. Moderate right hydronephrosis and hydroureter with periureteral stranding, despite the presence of the double-J ureteral stent. However, there is no asymmetry in enhancement or excretion of the kidneys. 4. Prominence of stool in the colon extending to the colostomy site. 5. 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   Electronically Signed   By: Van Clines M.D.   On: 03/27/2015 09:34   Ct Abdomen Pelvis W Contrast  03/27/2015   CLINICAL DATA:  Stage III colorectal cancer, resection and colostomy. Right ureteral stent placed. Radiation therapy completed. Ongoing chemotherapy. Pulmonary embolus on 11/28/2013  EXAM: CT CHEST, ABDOMEN, AND PELVIS WITH CONTRAST  TECHNIQUE: Multidetector CT imaging of the chest, abdomen and pelvis was performed following the standard protocol during bolus administration of intravenous contrast.  CONTRAST:  168m OMNIPAQUE IOHEXOL 300 MG/ML  SOLN  COMPARISON:  Multiple exams, including 11/28/2014 and 11/14/2014  FINDINGS: CT CHEST FINDINGS  Mediastinum/Nodes: Minimal atherosclerotic calcification of the aortic  arch.  Contrast medium is present in the esophagus, probably reflecting gastroesophageal reflux.  No pathologic thoracic adenopathy. No large clot in the pulmonary arterial tree, although today' s exam was not protocol does a pulmonary embolus exam and accordingly is not sensitive for smaller amounts of clot.  Lungs/Pleura: Unremarkable  Musculoskeletal: Unremarkable  CT ABDOMEN PELVIS FINDINGS  Hepatobiliary: Stable peripheral 4 mm hypodensity in segment 7 of the liver, image 50 series 2. A second 4 mm hypodense lesion is present posteriorly in segment 7 on that same image.  There is a new subcapsular or capsular nodular density along the posterolateral margin of segment 6 measuring 2.1 by 1.1 cm on image 61 of series 2.  Stable 4 mm hypodense lesion in segment 4A of the liver, image 53 series 2.  Pancreas: Unremarkable  Spleen: Unremarkable  Adrenals/Urinary Tract: Right hydronephrosis and hydroureter noted. Right double-J ureteral stent in place. There is symmetric enhancement between the right and left kidney. Periureteral stranding is noted on the right, especially proximally. Fifth I cannot exclude ureteral wall thickening on the right.  Stomach/Bowel: Small type 1 hiatal hernia. Prominence of stool in the colon extending to the colostomy site. Postoperative findings along the cecum. Mild wall thickening throughout the rectal pouch.  Vascular/Lymphatic: Aortoiliac atherosclerotic vascular disease. Small peripancreatic lymph nodes are not pathologically enlarged by size criteria.  Reproductive: Serpentine fluid density along the right adnexa possibly from hydrosalpinx.  Other: New 0.6 by 0.9 cm right omental nodule, image 80 series 2.  Musculoskeletal: Segment and right transverse process at L2. Segmental S1 vertebra. Grade 1 anterolisthesis at L5-S1 without impingement  or pars defects.  IMPRESSION: 1. Suspicion for new peritoneal metastatic disease including a 2.1 by 1.1 cm capsular nodule along the posterior  margin of hepatic segment 6, and a new small right anterior omental nodule. 2. Several additional hypodense tiny lesions in the liver are stable. 3. Moderate right hydronephrosis and hydroureter with periureteral stranding, despite the presence of the double-J ureteral stent. However, there is no asymmetry in enhancement or excretion of the kidneys. 4. Prominence of stool in the colon extending to the colostomy site. 5. 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   Electronically Signed   By: Van Clines M.D.   On: 03/27/2015 09:34    Medications: I have reviewed the patient's current medications.  Assessment/Plan:  1. Multifocal stage IIIB colon cancer, MMR normal, MSI stable. Status post FOLFOX 4 cycles followed by radiation 01/23/2015 through 03/02/2015. Restaging CT evaluation 03/27/2015 with suspicion for new peritoneal metastatic disease including a 2.1 x 1.1 cm capsular nodule along the posterior margin of hepatic segment 6 and a new small right anterior omental nodule. 2. Pulmonary embolus 11/28/2014. Initially on Lovenox, then transitioned to Coumadin. Followed through the Coumadin clinic in our office. 3. Right pelvic fluid collection on CT 11/14/2014. Felt to likely be a seroma. 4. Status post right ureter stent placement. 5. Iron deficiency anemia. She has received Feraheme in the past. 6. History of grade 1 peripheral neuropathy, fatigue secondary to chemotherapy.  Disposition: Allison Mitchell was seen with Dr. Burr Medico at today's visit. Dr. Burr Medico reviewed the CT result with Allison Mitchell. She understands the CT indicates possible new peritoneal metastatic disease. Dr. Burr Medico recommends a referral to interventional radiology for a biopsy to confirm involvement with cancer. Dr. Burr Medico also recommends a referral to Dr.  Clovis Riley at [facility] for consideration of HIPEC pending the biopsy result. Allison Mitchell is in agreement with this plan.  Prior to the biopsy she will need to be transitioned to Lovenox once daily dosing. She will let us know the date of the biopsy. I have discussed with the Coumadin Clinic pharmacist.  Dr. Burr Medico recommends proceeding with chemotherapy tomorrow as planned. Allison Mitchell will be seen in follow-up prior to the next chemotherapy in 2 weeks. She will contact the office in the interim with any problems.  Patient seen with Dr. Burr Medico.  Ned Card ANP/GNP-BC   03/28/2015  12:33 PM   I have seen the patient, examined Allison. I agree with the assessment and and plan and have edited the notes.   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 I recommend a biopsy of the subcapsular liver lesion. I will consider Foundation one test on Allison new biopsy sample if it confirms metastasis, to see if she is a candidate for EGFR or other targeted therapy.  I'll speak with Allison Mitchell tomorrow when she comes in for chemotherapy.  I'll not adding Avastin to FOLFOX, due to the pending surgery.  Truitt Merle

## 2015-03-28 NOTE — Patient Instructions (Signed)
INR at goal Continue Coumadin to 5 mg (whole tablet) on Monday, Wednesday and Friday, 2.5 mg (half tablet) all other days.  Return on 04/12/15 at 8am for lab and 8:30am tx and we will see you in the infusion room

## 2015-03-28 NOTE — Telephone Encounter (Signed)
Gave adn printed appt sched and avs fo rpt for May and June...sed Added tx.

## 2015-03-28 NOTE — Progress Notes (Addendum)
INR at goal today at 2.2 (Goal 2-3) Pt is doing well with no complaints Pt states she has stopped taking Tramadol and her pain is now improved Ms. Resler is here to see Ned Card, NP No other diet or medication changes Pt will be starting FOLFOX tomorrow (5/26) No unusual bleeding or bruising No missed or extra doses Plan: Continue Coumadin to 5 mg (whole tablet) on Monday, Wednesday and Friday, 2.5 mg (half tablet) all other days.  Return on 04/12/15 at 8am for lab and 8:30am tx and we will see you in the infusion room   Addendum: New liver lesions on CT scan. Plan for liver biopsy (not set up yet).  Pt will bridge with lovenox prior to and after procedure. Lovenox 1.5 mg/kg/day = 150 mg daily (samples from pharmacy can be provided 03/29/15 while patient is here for chemo).

## 2015-03-29 ENCOUNTER — Ambulatory Visit (HOSPITAL_BASED_OUTPATIENT_CLINIC_OR_DEPARTMENT_OTHER): Payer: BLUE CROSS/BLUE SHIELD

## 2015-03-29 ENCOUNTER — Encounter: Payer: Self-pay | Admitting: *Deleted

## 2015-03-29 ENCOUNTER — Telehealth: Payer: Self-pay | Admitting: Hematology

## 2015-03-29 ENCOUNTER — Encounter: Payer: Self-pay | Admitting: Nurse Practitioner

## 2015-03-29 VITALS — BP 140/76 | HR 84 | Temp 98.2°F | Resp 20

## 2015-03-29 DIAGNOSIS — C182 Malignant neoplasm of ascending colon: Secondary | ICD-10-CM

## 2015-03-29 DIAGNOSIS — C19 Malignant neoplasm of rectosigmoid junction: Secondary | ICD-10-CM

## 2015-03-29 DIAGNOSIS — C187 Malignant neoplasm of sigmoid colon: Secondary | ICD-10-CM | POA: Diagnosis not present

## 2015-03-29 DIAGNOSIS — C180 Malignant neoplasm of cecum: Secondary | ICD-10-CM | POA: Diagnosis not present

## 2015-03-29 DIAGNOSIS — Z5111 Encounter for antineoplastic chemotherapy: Secondary | ICD-10-CM | POA: Diagnosis not present

## 2015-03-29 LAB — CEA: CEA: 1.2 ng/mL (ref 0.0–5.0)

## 2015-03-29 MED ORDER — DEXAMETHASONE SODIUM PHOSPHATE 10 MG/ML IJ SOLN: 10.0000 mg | Freq: Once | INTRAMUSCULAR | Status: DC

## 2015-03-29 MED ORDER — SODIUM CHLORIDE 0.9 % IV SOLN
Freq: Once | INTRAVENOUS | Status: AC
  Administered 2015-03-29: 08:00:00 via INTRAVENOUS
  Filled 2015-03-29: qty 4

## 2015-03-29 MED ORDER — DEXTROSE 5 % IV SOLN
Freq: Once | INTRAVENOUS | Status: AC
  Administered 2015-03-29: 08:00:00 via INTRAVENOUS

## 2015-03-29 MED ORDER — LEUCOVORIN CALCIUM INJECTION 350 MG
400.0000 mg/m2 | Freq: Once | INTRAMUSCULAR | Status: AC
  Administered 2015-03-29: 860 mg via INTRAVENOUS
  Filled 2015-03-29: qty 43

## 2015-03-29 MED ORDER — FLUOROURACIL CHEMO INJECTION 2.5 GM/50ML
400.0000 mg/m2 | Freq: Once | INTRAVENOUS | Status: AC
  Administered 2015-03-29: 850 mg via INTRAVENOUS
  Filled 2015-03-29: qty 17

## 2015-03-29 MED ORDER — OXALIPLATIN CHEMO INJECTION 100 MG/20ML
85.0000 mg/m2 | Freq: Once | INTRAVENOUS | Status: AC
  Administered 2015-03-29: 185 mg via INTRAVENOUS
  Filled 2015-03-29: qty 37

## 2015-03-29 MED ORDER — SODIUM CHLORIDE 0.9 % IV SOLN
2400.0000 mg/m2 | INTRAVENOUS | Status: DC
  Administered 2015-03-29: 5150 mg via INTRAVENOUS
  Filled 2015-03-29: qty 103

## 2015-03-29 MED ORDER — SODIUM CHLORIDE 0.9 % IV SOLN: 2400.0000 mg/m2 | INTRAVENOUS | Status: DC

## 2015-03-29 MED ORDER — ONDANSETRON 8 MG/50ML IVPB (CHCC): 8.0000 mg | Freq: Once | INTRAVENOUS | Status: DC

## 2015-03-29 NOTE — [facility] Oncology Navigator Note (Signed)
Oncology Nurse Navigator Documentation  Oncology Nurse Navigator Flowsheets 03/29/2015  Navigator Encounter Type Treatment  Patient Visit Type Medonc  Treatment Phase Treatment  Barriers/Navigation Needs No barriers at this time-met with patient and her husband in infusion area-1st navigator encounter with patient.  Support Groups/Services GI;Survivor Celebration  Time Spent with Patient 15  Reports her symptoms are well controlled and her husband is very supportive. Feels she is coping as well as she could. Has moved her living area to lower floor in home so she has clean,neat area and does not need to deal with the mess her teens keep upstairs. 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 One daughter is working on Educational psychologist to go to college this fall.

## 2015-03-29 NOTE — Patient Instructions (Signed)
[facility] Discharge Instructions for Patients Receiving Chemotherapy  Today you received the following chemotherapy agents:  Oxaliplatin, Leucovorin and 5FU  To help prevent nausea and vomiting after your treatment, we encourage you to take your nausea medication as ordered per MD.   If you develop nausea and vomiting that is not controlled by your nausea medication, call the clinic.   BELOW ARE SYMPTOMS THAT SHOULD BE REPORTED IMMEDIATELY:  *FEVER GREATER THAN 100.5 F  *CHILLS WITH OR WITHOUT FEVER  NAUSEA AND VOMITING THAT IS NOT CONTROLLED WITH YOUR NAUSEA MEDICATION  *UNUSUAL SHORTNESS OF BREATH  *UNUSUAL BRUISING OR BLEEDING  TENDERNESS IN MOUTH AND THROAT WITH OR WITHOUT PRESENCE OF ULCERS  *URINARY PROBLEMS  *BOWEL PROBLEMS  UNUSUAL RASH Items with * indicate a potential emergency and should be followed up as soon as possible.  Feel free to call the clinic you have any questions or concerns. The clinic phone number is [phone].  Please show the CHEMO ALERT CARD at check-in to the Emergency Department and triage nurse.   

## 2015-03-29 NOTE — Telephone Encounter (Signed)
per Gerald Stabs in [place] to sch CC-pt aware

## 2015-03-31 ENCOUNTER — Ambulatory Visit (HOSPITAL_BASED_OUTPATIENT_CLINIC_OR_DEPARTMENT_OTHER): Payer: BLUE CROSS/BLUE SHIELD

## 2015-03-31 VITALS — BP 136/60 | HR 90 | Temp 97.8°F | Resp 18

## 2015-03-31 DIAGNOSIS — C19 Malignant neoplasm of rectosigmoid junction: Secondary | ICD-10-CM

## 2015-03-31 MED ORDER — HEPARIN SOD (PORK) LOCK FLUSH 100 UNIT/ML IV SOLN
500.0000 [IU] | Freq: Once | INTRAVENOUS | Status: AC | PRN
  Administered 2015-03-31: 500 [IU]
  Filled 2015-03-31: qty 5

## 2015-03-31 MED ORDER — SODIUM CHLORIDE 0.9 % IJ SOLN
10.0000 mL | INTRAMUSCULAR | Status: DC | PRN
  Administered 2015-03-31: 10 mL
  Filled 2015-03-31: qty 10

## 2015-03-31 NOTE — Patient Instructions (Signed)

## 2015-03-31 NOTE — Progress Notes (Signed)
Pt here for pump d/c.  Under treatment plan, patient also due for neulasta injection. Per pt, she does not think she is due for a shot today. She does not remember Dr. Burr Medico or Ned Card, NP mentioning injection at office visit on 03/28/15. Patient states she is coming to [facility] on 04/04/15 for an appt with Dr. Lisbeth Renshaw and would be happy to get the injection that day if she needs it. Message will be sent to Dr. Burr Medico and Ned Card, NP for clarification and to notify patient if she needs lab or injection appt on 04/04/15.

## 2015-04-04 ENCOUNTER — Encounter: Payer: Self-pay | Admitting: Hematology

## 2015-04-04 ENCOUNTER — Encounter: Payer: Self-pay | Admitting: Radiation Oncology

## 2015-04-04 ENCOUNTER — Other Ambulatory Visit: Payer: Self-pay | Admitting: *Deleted

## 2015-04-04 ENCOUNTER — Telehealth: Payer: Self-pay | Admitting: Hematology

## 2015-04-04 ENCOUNTER — Telehealth: Payer: Self-pay | Admitting: *Deleted

## 2015-04-04 ENCOUNTER — Ambulatory Visit (HOSPITAL_BASED_OUTPATIENT_CLINIC_OR_DEPARTMENT_OTHER): Payer: BLUE CROSS/BLUE SHIELD | Admitting: Hematology

## 2015-04-04 ENCOUNTER — Ambulatory Visit
Admission: RE | Admit: 2015-04-04 | Discharge: 2015-04-04 | Disposition: A | Discharge status: Home / Self Care | Payer: BLUE CROSS/BLUE SHIELD | Source: Ambulatory Visit | Attending: Radiation Oncology | Admitting: Radiation Oncology

## 2015-04-04 VITALS — BP 161/91 | HR 79 | Temp 97.8°F | Resp 19 | Ht 68.0 in | Wt 208.5 lb

## 2015-04-04 VITALS — BP 141/71 | HR 87 | Temp 98.1°F | Resp 20 | Ht 68.0 in | Wt 230.0 lb

## 2015-04-04 DIAGNOSIS — C187 Malignant neoplasm of sigmoid colon: Secondary | ICD-10-CM | POA: Diagnosis not present

## 2015-04-04 DIAGNOSIS — C182 Malignant neoplasm of ascending colon: Secondary | ICD-10-CM | POA: Diagnosis not present

## 2015-04-04 DIAGNOSIS — I2699 Other pulmonary embolism without acute cor pulmonale: Secondary | ICD-10-CM | POA: Diagnosis not present

## 2015-04-04 DIAGNOSIS — D500 Iron deficiency anemia secondary to blood loss (chronic): Secondary | ICD-10-CM

## 2015-04-04 DIAGNOSIS — C180 Malignant neoplasm of cecum: Secondary | ICD-10-CM

## 2015-04-04 DIAGNOSIS — C19 Malignant neoplasm of rectosigmoid junction: Secondary | ICD-10-CM

## 2015-04-04 DIAGNOSIS — M545 Low back pain, unspecified: Secondary | ICD-10-CM | POA: Insufficient documentation

## 2015-04-04 DIAGNOSIS — N9489 Other specified conditions associated with female genital organs and menstrual cycle: Secondary | ICD-10-CM

## 2015-04-04 DIAGNOSIS — R5383 Other fatigue: Secondary | ICD-10-CM

## 2015-04-04 DIAGNOSIS — G620 Drug-induced polyneuropathy: Secondary | ICD-10-CM

## 2015-04-04 HISTORY — DX: Personal history of irradiation: Z92.3

## 2015-04-04 NOTE — Telephone Encounter (Signed)
Called pt at home and spoke with husband since pt was not available.  Gave husband instructions for pt not to take Coumadin on 04/12/15;  Pt will receive further instructions from pharmacist about Lovenox.  Husband voiced understanding and stated he would relay message to pt.

## 2015-04-04 NOTE — Progress Notes (Signed)
Radiation Oncology         [phone] ________________________________  Name: Allison Mitchell MRN: [id number]  Date: 04/04/2015  DOB: [birth year]  Follow-Up Visit Note  CC: [name], MD  Truitt Merle, MD  Diagnosis:    Oncology History   Next week.Colorectal cancer, stage III   Staging form: Neuroendocrine Tumor - Colon/Rectum, AJCC 7th Edition     Clinical: Stage Unknown (T4, NX, M0) - Unsigned     Pathologic: No stage assigned - Unsigned       Colorectal cancer, stage III   10/04/2014 Tumor Marker CEA 1.0 .  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 [id number],stage IIIC.    10/10/2014 Surgery partial right hemicolectomy with removal of terminal ileum, sigmoid colectomy, right ureter lysis, (+) residual surgical margin    11/23/2014 - 01/06/2015 Chemotherapy FOLFOX every 2 weeks for 4 cycles    01/23/2015 - 03/02/2015 Radiation Therapy adjuvant radiation   Interval Since Last Radiation: 03/02/15 to the pelvis/abdomin  Narrative:  The patient returns today for routine follow-up. CT abd/pelvis done on 03/27/15 and she is here for the results. Pt went to the ER in [facility] this morning at [place] with c/o a "burning" pain in the lower back. Started in the right lower hip radiating up and around to to abdomen. Patient was given Oxycodone $RemoveBeforeD'5mg'sOIbdkAabavima$  tablets, (1 tab every 4 hours as needed pain). She left the ER at 545AM; no bleeding, colostomy intact. Said her stent needs to be replaced sometime in June, 2016. She has a biopsy scheduled 04/16/15 on her liver at [facility]; if cancerous, Dr. Clovis Riley at [facility] to hipec there. Patient's pain level is now mild, and took pain medication at 6AM. The pt didn't eat since 5:30PM yesterday. The pt is scheduled to see Dr. Burr Medico later today.  ALLERGIES:  is allergic to  amoxicillin.  Meds: Current Outpatient Prescriptions  Medication Sig Dispense Refill  . citalopram (CELEXA) 20 MG tablet Take 20-40 mg by mouth daily.   1  . hyaluronate sodium (RADIAPLEXRX) GEL Apply 1 application topically daily. Apply to abdomen  treated area after rad tx daily    . lidocaine-prilocaine (EMLA) cream Apply 1 application topically as needed. 30 g 0  . magnesium hydroxide (MILK OF MAGNESIA) 400 MG/5ML suspension Take 5 mLs by mouth daily as needed for mild constipation.    . Meth-Hyo-M Bl-Na Phos-Ph Sal (URIBEL) 118 MG CAPS Take 1 capsule by mouth as needed. Every 8 hours as needed  11  . omeprazole (PRILOSEC) 40 MG capsule Take 40 mg by mouth every morning.  12  . ondansetron (ZOFRAN) 8 MG tablet Take 1 tablet (8 mg total) by mouth every 8 (eight) hours as needed for nausea or vomiting. 20 tablet 3  . oxyCODONE (OXY IR/ROXICODONE) 5 MG immediate release tablet Take 5 mg by mouth every 4 (four) hours as needed.    Marland Kitchen PREVIDENT 5000 BOOSTER PLUS 1.1 % PSTE See admin instructions.  5  . traMADol (ULTRAM) 50 MG tablet TAKE ONE TABLET EVERY 6 HOURS AS NEEDED FOR PAIN 30 tablet 0  . warfarin (COUMADIN) 5 MG tablet Take as directed by coumadin clinic 30 tablet 3  . zolpidem (AMBIEN) 5 MG tablet Take 1 tablet (5 mg total) by mouth at bedtime as needed for sleep. 20 tablet 0  . zolpidem (AMBIEN)  5 MG tablet Take 1 tablet (5 mg total) by mouth at bedtime as needed for sleep. 30 tablet 0   No current facility-administered medications for this encounter.    Physical Findings: The patient is in no acute distress. Patient is alert and oriented.  height is _0  (1.727 m) and weight is 230 lb (104.327 kg). Her oral temperature is 98.1 F (36.7 C). Her blood pressure is 141/71 and her pulse is 87. Her respiration is 20. Marland Kitchen   Pt denies palpable pain at this moment. Pain was palpable this morning.  Lab Findings: Lab Results  Component Value Date   WBC 3.6* 03/28/2015   HGB 8.8*  03/28/2015   HCT 27.6* 03/28/2015   MCV 82.2 03/28/2015   PLT 230 03/28/2015     Radiographic Findings: Ct Chest W Contrast  03/27/2015   CLINICAL DATA:  Stage III colorectal cancer, resection and colostomy. Right ureteral stent placed. Radiation therapy completed. Ongoing chemotherapy. Pulmonary embolus on 11/28/2013  EXAM: CT CHEST, ABDOMEN, AND PELVIS WITH CONTRAST  TECHNIQUE: Multidetector CT imaging of the chest, abdomen and pelvis was performed following the standard protocol during bolus administration of intravenous contrast.  CONTRAST:  189m OMNIPAQUE IOHEXOL 300 MG/ML  SOLN  COMPARISON:  Multiple exams, including 11/28/2014 and 11/14/2014  FINDINGS: CT CHEST FINDINGS  Mediastinum/Nodes: Minimal atherosclerotic calcification of the aortic arch.  Contrast medium is present in the esophagus, probably reflecting gastroesophageal reflux.  No pathologic thoracic adenopathy. No large clot in the pulmonary arterial tree, although today' s exam was not protocol does a pulmonary embolus exam and accordingly is not sensitive for smaller amounts of clot.  Lungs/Pleura: Unremarkable  Musculoskeletal: Unremarkable  CT ABDOMEN PELVIS FINDINGS  Hepatobiliary: Stable peripheral 4 mm hypodensity in segment 7 of the liver, image 50 series 2. A second 4 mm hypodense lesion is present posteriorly in segment 7 on that same image.  There is a new subcapsular or capsular nodular density along the posterolateral margin of segment 6 measuring 2.1 by 1.1 cm on image 61 of series 2.  Stable 4 mm hypodense lesion in segment 4A of the liver, image 53 series 2.  Pancreas: Unremarkable  Spleen: Unremarkable  Adrenals/Urinary Tract: Right hydronephrosis and hydroureter noted. Right double-J ureteral stent in place. There is symmetric enhancement between the right and left kidney. Periureteral stranding is noted on the right, especially proximally. Fifth I cannot exclude ureteral wall thickening on the right.  Stomach/Bowel:  Small type 1 hiatal hernia. Prominence of stool in the colon extending to the colostomy site. Postoperative findings along the cecum. Mild wall thickening throughout the rectal pouch.  Vascular/Lymphatic: Aortoiliac atherosclerotic vascular disease. Small peripancreatic lymph nodes are not pathologically enlarged by size criteria.  Reproductive: Serpentine fluid density along the right adnexa possibly from hydrosalpinx.  Other: New 0.6 by 0.9 cm right omental nodule, image 80 series 2.  Musculoskeletal: Segment and right transverse process at L2. Segmental S1 vertebra. Grade 1 anterolisthesis at L5-S1 without impingement or pars defects.  IMPRESSION: 1. Suspicion for new peritoneal metastatic disease including a 2.1 by 1.1 cm capsular nodule along the posterior margin of hepatic segment 6, and a new small right anterior omental nodule. 2. Several additional hypodense tiny lesions in the liver are stable. 3. Moderate right hydronephrosis and hydroureter with periureteral stranding, despite the presence of the double-J ureteral stent. However, there is no asymmetry in enhancement or excretion of the kidneys. 4. Prominence of stool in the colon extending to the colostomy site. 5.  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   Electronically Signed   By: Van Clines M.D.   On: 03/27/2015 09:34   Ct Abdomen Pelvis W Contrast  03/27/2015   CLINICAL DATA:  Stage III colorectal cancer, resection and colostomy. Right ureteral stent placed. Radiation therapy completed. Ongoing chemotherapy. Pulmonary embolus on 11/28/2013  EXAM: CT CHEST, ABDOMEN, AND PELVIS WITH CONTRAST  TECHNIQUE: Multidetector CT imaging of the chest, abdomen and pelvis was performed following the standard protocol during bolus administration of intravenous contrast.   CONTRAST:  163m OMNIPAQUE IOHEXOL 300 MG/ML  SOLN  COMPARISON:  Multiple exams, including 11/28/2014 and 11/14/2014  FINDINGS: CT CHEST FINDINGS  Mediastinum/Nodes: Minimal atherosclerotic calcification of the aortic arch.  Contrast medium is present in the esophagus, probably reflecting gastroesophageal reflux.  No pathologic thoracic adenopathy. No large clot in the pulmonary arterial tree, although today' s exam was not protocol does a pulmonary embolus exam and accordingly is not sensitive for smaller amounts of clot.  Lungs/Pleura: Unremarkable  Musculoskeletal: Unremarkable  CT ABDOMEN PELVIS FINDINGS  Hepatobiliary: Stable peripheral 4 mm hypodensity in segment 7 of the liver, image 50 series 2. A second 4 mm hypodense lesion is present posteriorly in segment 7 on that same image.  There is a new subcapsular or capsular nodular density along the posterolateral margin of segment 6 measuring 2.1 by 1.1 cm on image 61 of series 2.  Stable 4 mm hypodense lesion in segment 4A of the liver, image 53 series 2.  Pancreas: Unremarkable  Spleen: Unremarkable  Adrenals/Urinary Tract: Right hydronephrosis and hydroureter noted. Right double-J ureteral stent in place. There is symmetric enhancement between the right and left kidney. Periureteral stranding is noted on the right, especially proximally. Fifth I cannot exclude ureteral wall thickening on the right.  Stomach/Bowel: Small type 1 hiatal hernia. Prominence of stool in the colon extending to the colostomy site. Postoperative findings along the cecum. Mild wall thickening throughout the rectal pouch.  Vascular/Lymphatic: Aortoiliac atherosclerotic vascular disease. Small peripancreatic lymph nodes are not pathologically enlarged by size criteria.  Reproductive: Serpentine fluid density along the right adnexa possibly from hydrosalpinx.  Other: New 0.6 by 0.9 cm right omental nodule, image 80 series 2.  Musculoskeletal: Segment and right transverse process at L2.  Segmental S1 vertebra. Grade 1 anterolisthesis at L5-S1 without impingement or pars defects.  IMPRESSION: 1. Suspicion for new peritoneal metastatic disease including a 2.1 by 1.1 cm capsular nodule along the posterior margin of hepatic segment 6, and a new small right anterior omental nodule. 2. Several additional hypodense tiny lesions in the liver are stable. 3. Moderate right hydronephrosis and hydroureter with periureteral stranding, despite the presence of the double-J ureteral stent. However, there is no asymmetry in enhancement or excretion of the kidneys. 4. Prominence of stool in the colon extending to the colostomy site. 5. 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   Electronically Signed   By: WVan ClinesM.D.   On: 03/27/2015 09:34    Impression: Pt is doing well in terms of no side effects of radiation treatment. No ongoing GI issues.  Plan: Pt should f/u with me in 4 months. Going to see oncology today and will have a  biopsy of the liver on 04/16/15.  This document serves as a record of services personally performed by Kyung Rudd, MD. It was created on his behalf by Darcus Austin, a trained medical scribe. The creation of this record is based on the scribe's personal observations and the provider's statements to them. This document has been checked and approved by the attending provider.     Jodelle Gross, M.D., Ph.D.

## 2015-04-04 NOTE — Progress Notes (Signed)
[place]  Telephone:([phone]) [phone] Fax:([phone]) [place] Note   Patient Care Team: [name], MD as PCP - General (Family Medicine) 04/04/2015  CHIEF COMPLAINTS:  Right low back pain   Oncology History   Next week.Colorectal cancer, stage III   Staging form: Neuroendocrine Tumor - Colon/Rectum, AJCC 7th Edition     Clinical: Stage Unknown (T4, NX, M0) - Unsigned     Pathologic: No stage assigned - Unsigned       Colorectal cancer, stage III   10/04/2014 Tumor Marker CEA 1.0 .  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 mpT4aN2aM0,stage IIIC.    10/10/2014 Surgery partial right hemicolectomy with removal of terminal ileum, sigmoid colectomy, right ureter lysis, (+) residual surgical margin    11/23/2014 - 01/06/2015 Chemotherapy FOLFOX every 2 weeks for 4 cycles    01/23/2015 - 03/02/2015 Radiation Therapy adjuvant radiation   OTHER ISSUES: 1. Pulmonary embolism in segment omental branches of the right lung, diagnosed on 11/28/2014 2. Right ureter stent placed on 10/20/2014  CURRENT THERAPY: FOLFOX every 2 weeks, started on 11/23/2014, plan for 12 cycles (with radiation in the middle).   HISTORY OF PRESENTING ILLNESS:  Allison Mitchell 54 y.o. female is here for follow-up after her hospital discharge. She was recently diagnosed with 3 synchronous to colon cancer, status post surgical resection. I initially saw her when she was diagnosed in the hospital.  She presented with some GI symptoms since last October 2014 at which time she was diagnosed with ischemic colitis. Due to guaiac-positive stools, patient had undergone an outpatient colonoscopy on 09/26/2014, which showed inflamed and ulcerated mucosa in the terminal ileum, and a 1.5 cm mass in the ascending colon and additional  1.5 cm mass in the mid sigmoid colon. All biopsy from the above 3 sites came back positive for invasive moderate differentiated adenocarcinoma, and the sigmoid mass containing squamous cell differentiation.   She was hospitalized for worsening of her symptoms on 10/03/2014.  A CT of the abdomen and pelvis with contrast on 10/02/2014 was remarkable for soft tissue/bowel wall thickening of the right lower pelvis center at the cecum-terminal ileum involving a portion of the adjacent sigmoid colon. This causes small bowel obstruction and obstructed the distal right ureter causing moderate to severe right hydroureteronephrosis. She was transferred from [facility] to [facility] for surgical management and further testing. CT of the chest with contrast on 10/03/2014 negative for metastatic disease. MRI of the liver without and without contrast on 10/03/2014 is remarkable for small hepatic cysts, but no findings suspicious for hepatic metastatic disease. Right-sided hydroureteronephrosis was once again seen, with early small bowel obstruction bowel gas pattern.  She underwent right hemicolectomy with primary anastomosis with sigmoid colectomy with end colostomy on Friday, Dec 5, after cystoscopy is performed on 12/4 to rule out GU invasion and a ureteral stent placement.  She has family history of colon cancer in her grandmother and polyps in her sister. Denies risk factors for HIV or hepatitis. Denies Diabetes. No Tobacco. No ETOH. Denies prior radiation. SHe did consume significant amount of red meat until recently. We are asked to see the patient in consultation, with recommendations on the oncology standpoint  She is recovering slowly after the surgery. She had the surgical suture opened about 3 weeks ago and was treated  for antibiotics , and now much improved. She has no significant pain, but mild abdominal discomfort, no nausea, her stool is getting thicker, she clean her colostomy bag daily. She  was seen by Dr. Hulen Skains this morning. She noticed some cloudy urine in the past few days, with mild burning sensation, no fever or back pain. Her appetite is getting better, eats well.  She lost about 25 lbs. No fever or chills. No other new complains.   INTERIM HISTORY: [name] requests to be seen urgently today for her right low back pain. She has chronic mild burning sensation across low back since she has colon surgery. She developed worsening right side low back pain last night around 7:00. The pain radiates to the right hip and buttock area, no fried lack weakness or numbness. She denies any injury, but did vacuum her house during the day yesterday. She denies any dysuria, fever, diarrhea, or any other new symptoms. She took Tylenol and tramadol several times but her pain was getting worse and she couldn't sleep. She finally went to a local emergency room at 4am this morning, was giving a dose of oxycodone 5 mg, and the pain resolved. She feels well now.  MEDICAL HISTORY:  Past Medical History  Diagnosis Date  . GERD (gastroesophageal reflux disease)   . Depression   . Anxiety   . 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 Peripheral neuropathy due to chemotherapy   . Hydronephrosis, right     malignant--  s/p reimiplant right ureter  . History of pulmonary embolus (PE)     11-28-2014--  due to chemotherapy on coumadin  . Anticoagulated on Coumadin   . Colorectal cancer, stage III oncologist--  dr Stefani Dama    Dx  10-10-2014---Stage IIIC, mpT4aN2aM0, Multifocal synchronous, Moderate differentiated Invasive, Mets to nodes (5 out of 17)/  s/p  Resection tumor cecum, terminal ileum, sigmoid/  currently chemoradiation therapy  . Dysuria   . S/P radiation therapy 03/02/15    completed pelvis/abd/50.4Gy    SURGICAL HISTORY: Past Surgical History  Procedure Laterality Date  . Cystoscopy with retrograde pyelogram, ureteroscopy and stent placement Right 10/05/2014     Procedure: [place], URETEROSCOPY  AND STENT PLACEMENT;  Surgeon: Alexis Frock, MD;  Location: [facility];  Service: Urology;  Laterality: Right;  . Laparotomy N/A 10/10/2014    Procedure: EXPLORATORY LAPAROTOMY;  Surgeon: Doreen Salvage, MD;  Location: [place];  Service: General;  Laterality: N/A;  . Partial colectomy  10/10/2014    Procedure: PARTIAL SIGMOID COLECTOMY;  Surgeon: Doreen Salvage, MD;  Location: [place];  Service: General;;  . Colostomy  10/10/2014    Procedure: COLOSTOMY;  Surgeon: Doreen Salvage, MD;  Location: [place];  Service: General;;  . Colostomy revision  10/10/2014    Procedure: PARTIAL RIGHT COLECTOMY;  Surgeon: Doreen Salvage, MD;  Location: [place];  Service: General;;  . Ureteral reimplantion Right 10/10/2014    Procedure: OPEN RIGHT URETERAL LYSIS ;  Surgeon: Alexis Frock, MD;  Location: [place];  Service: Urology;  Laterality: Right;  . Portacath placement N/A 10/17/2014    Procedure: INSERTION PORT-A-CATH LEFT SUBCLAVIAN AND MIDLINE INCISION STAPLE REMOVAL ;  Surgeon: Coralie Keens, MD;  Location: [place];  Service: General;  Laterality: N/A;  . Cystoscopy w/ retrogrades Right 01/10/2015    Procedure: CYSTOSCOPY WITH RETROGRADE PYELOGRAM;  Surgeon: Alexis Frock, MD;  Location[facility];  Service: Urology;  Laterality: Right;  . Cystoscopy  w/ ureteral stent placement Right 01/10/2015    Procedure: CYSTOSCOPY WITH STENT REPLACEMENT;  Surgeon: Alexis Frock, MD;  Location: [facility];  Service: Urology;  Laterality: Right;    SOCIAL HISTORY: History   Social History  . Marital Status: Married    Spouse Name: N/A  . Number of Children: N/A  . Years of Education: N/A   Occupational History  . Not on file.   Social History Main Topics  . Smoking status: Never Smoker   . Smokeless tobacco: Never Used  . Alcohol Use: No  . Drug Use: No  . Sexual Activity: No   Other Topics Concern  . 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 Cancer Mother     skin cancer   . Diabetes Father   . CAD Father     Onset in his 50s, has 7 stents  . Cancer Maternal Grandmother 88    colon cancer   . Cancer Paternal Grandmother 50    breast cancer     ALLERGIES:  is allergic to amoxicillin.  MEDICATIONS:  Current Outpatient Prescriptions  Medication Sig Dispense Refill  . citalopram (CELEXA) 20 MG tablet Take 20-40 mg by mouth daily.   1  . hyaluronate sodium (RADIAPLEXRX) GEL Apply 1 application topically daily. Apply to abdomen  treated area after rad tx daily    . lidocaine-prilocaine (EMLA) cream Apply 1 application topically as needed. 30 g 0  . magnesium hydroxide (MILK OF MAGNESIA) 400 MG/5ML suspension Take 5 mLs by mouth daily as needed for mild constipation.    . Meth-Hyo-M Bl-Na Phos-Ph Sal (URIBEL) 118 MG CAPS Take 1 capsule by mouth as needed. Every 8 hours as needed  11  . omeprazole (PRILOSEC) 40 MG capsule Take 40 mg by mouth every morning.  12  . ondansetron (ZOFRAN) 8 MG tablet Take 1 tablet (8 mg total) by mouth every 8 (eight) hours as needed for nausea or vomiting. 20 tablet 3  . oxyCODONE (OXY IR/ROXICODONE) 5 MG immediate release tablet Take 5 mg by mouth every 4 (four) hours as needed.    Marland Kitchen PREVIDENT 5000 BOOSTER PLUS 1.1 % PSTE See admin instructions.  5  . traMADol (ULTRAM) 50 MG tablet TAKE ONE TABLET EVERY 6 HOURS AS NEEDED FOR PAIN 30 tablet 0  . warfarin (COUMADIN) 5 MG tablet Take as directed by coumadin clinic 30 tablet 3  . zolpidem (AMBIEN) 5 MG tablet Take 1 tablet (5 mg total) by mouth at bedtime as needed for sleep. 30 tablet 0   No current facility-administered medications for this visit.    REVIEW OF SYSTEMS:   Constitutional: Denies fevers, chills or abnormal night sweats Eyes: Denies blurriness of vision, double  vision or watery eyes Ears, nose, mouth, throat, and face: Denies mucositis or sore throat Respiratory: Denies cough, dyspnea or wheezes Cardiovascular: Denies palpitation, chest discomfort or lower extremity swelling Gastrointestinal:  Denies nausea, heartburn or change in bowel habits Skin: Denies abnormal skin rashes Lymphatics: Denies new lymphadenopathy or easy bruising Neurological:Denies numbness, tingling or new weaknesses Behavioral/Psych: Mood is stable, no new changes  All other systems were reviewed with the patient and are negative.  PHYSICAL EXAMINATION: ECOG PERFORMANCE STATUS: 1 - Symptomatic but completely ambulatory  Filed Vitals:  04/04/15 1059  BP: 161/91  Pulse: 79  Temp: 97.8 F (36.6 C)  Resp: 19   Filed Weights   04/04/15 1059  Weight: 208 lb 8 oz (94.575 kg)    GENERAL:alert, no distress and comfortable SKIN: skin color, texture, turgor are normal, no rashes or significant lesions EYES: normal, conjunctiva are pink and non-injected, sclera clear OROPHARYNX:no exudate, no erythema and lips, buccal mucosa, and tongue normal  NECK: supple, thyroid normal size, non-tender, without nodularity LYMPH:  no palpable lymphadenopathy in the cervical, axillary or inguinal LUNGS: clear to auscultation and percussion with normal breathing effort HEART: regular rate & rhythm and no murmurs and no lower extremity edema ABDOMEN:abdomen soft, non-tender and normal bowel sounds. Positive for colostomy bag on the left, with normal-appearing stool. Surgical wound upper part is well-healed. Musculoskeletal:no cyanosis of digits and no clubbing  PSYCH: alert & oriented x 3 with fluent speech NEURO: no focal motor/sensory deficits  LABORATORY DATA:  CBC Latest Ref Rng 03/28/2015 03/20/2015 02/14/2015  WBC 3.9 - 10.3 10e3/uL 3.6(L) 3.3(L) 4.5  Hemoglobin 11.6 - 15.9 g/dL 8.8(L) 9.2(L) 9.9(L)  Hematocrit 34.8 - 46.6 % 27.6(L) 29.0(L) 31.9(L)  Platelets 145 - 400 10e3/uL 230  237 160    CMP Latest Ref Rng 03/28/2015 03/20/2015 02/14/2015  Glucose 70 - 140 mg/dl 102 92 119  BUN 7.0 - 26.0 mg/dL 14.4 10.7 9.5  Creatinine 0.6 - 1.1 mg/dL 0.8 0.9 0.8  Sodium 136 - 145 mEq/L 142 145 143  Potassium 3.5 - 5.1 mEq/L 3.8 3.7 3.8  Chloride 96 - 112 mmol/L - - -  CO2 22 - 29 mEq/L 20(L) 22 24  Calcium 8.4 - 10.4 mg/dL 8.2(L) 8.8 8.3(L)  Total Protein 6.4 - 8.3 g/dL 6.3(L) 6.2(L) 6.2(L)  Total Bilirubin 0.20 - 1.20 mg/dL 0.29 0.40 0.34  Alkaline Phos 40 - 150 U/L 110 114 121  AST 5 - 34 U/L 21 22 23   ALT 0 - 55 U/L 14 16 15      Surgical path 10/10/2014 1. Colon, segmental resection for tumor, Cecum, terminal ileum, sigmoid colon - MULTIFOCAL INVASIVE ADENOCARCINOMA, MODERATELY DIFFERENTIATED, THE LARGEST FOCUS SPANS 4.0 CM. - ADENOCARCINOMA INVOLVES SEROSA. - LYMPHOVASCULAR INVASION IS IDENTIFIED. - METASTATIC CARCINOMA IN 5 OF 17 LYMPH NODES (5/17). - THE PROXIMAL AND DISTAL SURGICAL RESECTION MARGINS ARE NEGATIVE FOR ADENOCARCINOMA. - SEE ONCOLOGY TABLE BELOW. 2. Soft tissue, biopsy, Right periureteral tissue - ADENOCARCINOMA. 3. Soft tissue, biopsy, Right periureteral tissue - INFLAMED AND DEGENERATING FIBROADIPOSE TISSUE. - THERE IS NO EVIDENCE OF MALIGNANCY. Microscopic Comment 1. COLON AND RECTUM (INCLUDING TRANS-ANAL RESECTION): Specimen: Distal ileum, cecum, proximal ascending colon, and adherent sigmoid colon. Procedure: Resection. Tumor site: Multiple foci, including ileocecal valve, ascending colon and sigmoid. Specimen integrity: Disrupted. Macroscopic intactness of mesorectum: N/A Macroscopic tumor perforation: Present. Invasive tumor: Maximum size: 4.0 cm. Histologic type(s): Adenocarcinoma. Histologic grade and differentiation: G2: moderately differentiated Type of polyp in which invasive carcinoma arose: Tubulovillous adenomas. Microscopic extension of invasive tumor: Focally involves the serosa. Lymph-Vascular invasion:  Present. Peri-neural invasion: Not identified. Tumor deposit(s) (discontinuous extramural extension): Not identified. Resection margins: Proximal margin: 4.0 cm Distal margin: 3.5 cm Circumferential (radial) (posterior ascending, posterior descending; lateral and posterior mid-rectum; and entire lower 1/3 rectum): Cannot be assessed. Mesenteric margin (sigmoid and transverse): Cannot be assessed. Treatment effect (neo-adjuvant therapy): N/A Additional polyp(s): Not identified. Non-neoplastic findings: No significant findings. Lymph nodes: number examined 17; number positive: 5 Pathologic Staging: mpT4a, pN2a, pMX Ancillary studies: A block will be sent for  MSI testing by IHC and MSI testing by PCR and the results reported separately. Comment: There appear to be three synchronous primary tumors in the specimen, one at the ileocecal valve, one in the ascending colon, and one at the sigmoid colon. Each of these foci contain an associated tubulovillous adenoma, suggesting three separate primary sites. The tumor in the sigmoid colon appears to involve the serosa as well. The other two tumors show extension into at least the pericolonic soft tissue. Dr Mali Rund has reviewed selected slides and concurs that there are likely three synchronous tumors here. The case was discussed with Dr Hulen Skains. Per Dr Hulen Skains, the specimen in #2 is a contiguous piece of tissue 2 of 4 Amended copy Amended FINAL for DELISE, SIMENSON [phone]) Microscopic Comment(continued) with the main tumor, and therefore, not considered a distant metastatic focus. Therefore, the tumor is staged as an MX. (JBK:ecj 10/16/2014) JOSHUA  Mismatch Repair (MMR) Protein Immunohistochemistry (IHC) IHC Expression Result: MLH1: Preserved nuclear expression (greater 50% tumor expression) MSH2: Preserved nuclear expression (greater 50% tumor expression) MSH6: Preserved nuclear expression (greater 50% tumor expression) PMS2:  Preserved nuclear expression (greater 50% tumor expression) * Internal control demonstrates intact nuclear expression Interpretation: NORMAL There is preserved expression  RADIOGRAPHIC STUDIES: I have personally reviewed the radiological images as listed and agreed with the findings in the report.  CT of abdomen and pelvis 11/14/2014 New complex fluid collection in the right pelvis measuring 2.9 x 5.9cm. Postop abscess cannot be excluded.  Decrease right hydronephrosis with ureteral stent in appropriate position.  No evidence of abdominal or pelvic metastatic disease.   ASSESSMENT & PLAN:  54 year old Caucasian female with minimal past medical history, who was found to have 3 multifocal colon cancer at terminal ileum/cecum, ascending colon and sigmoid colon. All of them has adenocarcinoma,  And the cecum tumor has squamous differentiation. The cecum tumor has directly invaded the soft tissue along the right ureter, which was not completely resected (1% tumor left over, per Dr. Hulen Skains). She has mpT4aN2aM0 stage IIIB, MSI/MMR normal.   1. Acute right low back pain -The pain seems resolved after 1 dose of oxycodone. This could be muscular pain. Her exam was unremarkable, no neuro deficits. -I instructed her to take oxycodone if the pain returns, and the use heating pad to the right hip and buttock area. Avoid heavy lifting or bending  -If she has persistent pain, I'll consider MRI of lumbar spine and right hip for further evaluation.   2. 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 Unfortunately, the tumor was not completely resected. She has very small amount of tumor left over. -Given her residual tumor after surgery, advanced stage III disease, high risk recurrence in the future, I strongly recommend adjuvant chemotherapy with the standard regimen FOLFOX.  Side effects were discussed with patient in great detail. She agrees to proceed. -I'll discuss with his radiation oncologist Dr. Lisbeth Renshaw about the case sequence of chemotherapy and irradiation. Both of [place] agreed to start chemotherapy for a few months first, then start radiation with or without sensitizing chemotherapy 5-FU, , then finish the rest of the adjuvant chemotherapy for total of 6 months. -she has now finished radiation, I recommended to restart chemotherapy next week.  -Her restaging scan unfortunately showed  probable peritoneal metastasis in liver and omentum. The scan results was reviewed with patient and her husband. I have referred her  to see Dr. Epifanio Lesches at the [facility] to consider HIPEC.  -liver biopsy next week, we'll transition her Coumadin to Lovenox   2. PE -She was on Lovenox injection for a month then switched to oral anticoagulant Coumadin.  -Follow-up with Coumadin clinic.   3. Right pelvic cyst on CT 11/14/2014 -This is likely a seroma secondary to surgery. Giving her afebrile, normal white count, no abdominal pain or tenderness, this is unlikely a abscess. -I have discussed with her surgeon Dr. Hulen Skains, he agrees with close monitoring. -repeat a CT scan next week  3. right ureter stent placement -She will follow up with her urologist.  -her urinary symptoms likely related to the stent, previous multiple culture were negative. - okay to take Tylenol for pain.  5. 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 Her iron study is consistent with iron deficient anemia -This is secondary to the bleeding from her colon cancer. -She received IV Feraheme 587m twice.   6.G1 peripheral neuropathy, fatigue  -Secondary to chemotherapy, improved  -Continue close monitoring  Plan, -She will return next week for second cycle of chemotherapy  -Liver biopsy June 13 by IR, Lovenox bridging before and after biopsy.    All questions were answered. The patient knows to call the  clinic with any problems, questions or concerns.  I spent 20 minutes counseling the patient face to face. The total time spent in the appointment was 25 minutes and more than 50% was on counseling.     FTruitt Merle MD 04/04/2015 12:16 PM

## 2015-04-04 NOTE — Telephone Encounter (Signed)
Spoke with Tiffany in IR re:  Pt has US Biopsy of Liver on 6/13.  Pt is on Coumadin currently.   Per Tiffany,  Pt to stop  Coumadin  4 days prior to procedure and  Radiologist will instruct pt when to resume Coumadin post procedure. Melissa, pharmacist notified of above info.   Pt will have coumadin clinic visit on 04/12/15.  Pt will be given further instructions by pharmacist about bridging with Lovenox. Spoke with pt at home and instructed pt  Not to take Coumadin starting 6/9 prior to biospy procedure. Message sent to Dr. Burr Medico.

## 2015-04-04 NOTE — Progress Notes (Addendum)
Follow up s/p radiation treatments pelvis/abdomen completed on  03/02/15 (28/28)  Ct abd/pelvis results done on 03/27/15, here for results, patient did go to the ER in [facility]  This am 4am c/o pain in right lower hip radiating up and around to  to abdomen  , patient was given Oxycodone 5mg  tablets, (1 tab every 4 hours as needed pain),  She left ER at 545am;no bleeding, , colostomy intact, regular bm'ssays her stent needs to be replaced sometime in June,2016  And she has a biopsy scheduled 04/16/15 on her liver,  At [facility], if cancerous ,Dr. Clovis Riley at [facility] to perform a  hipec there Patient still pain level now is  Mild, took pain medication at 6am,    Wt Readings from Last 3 Encounters:  03/28/15 211 lb 8 oz (95.936 kg)  03/20/15 209 lb 14.4 oz (95.21 kg)  02/23/15 217 lb 8 oz (98.657 kg)

## 2015-04-04 NOTE — Telephone Encounter (Signed)
S/w Shirley @Dr . Moody's office to confirm pt has apt w/Dr. Burr Medico at 10:30 today after Dr. Ida Rogue apt. She will relay message to pt to come upstairs to check in for her apt per 06/01 POF..... KJ

## 2015-04-04 NOTE — Telephone Encounter (Signed)
Allison Mitchell called requesting an appointment to see Dr. Burr Medico today.  "I went to the ER last night because my lower right side/back hurts.  I see Dr. Lisbeth Renshaw at 1:00 and would like to see Dr. Burr Medico about my back."  Observed appointment at 10:00 am and verified with [facility] in [place].  [name] is on her way now from [facility].

## 2015-04-04 NOTE — Telephone Encounter (Signed)
Faxed pt medical records to Dr. Clovis Riley at [facility].  Dr. Clovis Riley will review pt's records to see if pt qualfies for HIPEC..  If pt qualfies for HIPEC the  office will call pt with appt.Marland Kitchen

## 2015-04-04 NOTE — Telephone Encounter (Signed)
Please add her to my schedule today. Thanks.  Krista Blue

## 2015-04-05 ENCOUNTER — Encounter: Payer: Self-pay | Admitting: Pharmacist

## 2015-04-05 NOTE — Progress Notes (Signed)
Ms Denno will have liver biopsy on 04/16/15.  Anticoag plan as below.  Discussed with Dr. Burr Medico.  6/9 Begin holding coumadin (will have INR results from this day)  6/10 and 6/11 Lovenox 140mg  daily.  6/12 Hold Lovenox since 24 hours prior to biopsy  If radiology does not give post biopsy anti-coag instructions, would:  6/13 Resume coumadin 2.5mg  MWF and 5mg  other days.  6/14 Resume Lovenox 140mg  daily  6/17 Check PT/INR INR in coumadin clinic   Pt is scheduled for lab/MD appt/chemo and coumadin clinic on 04/12/15.  Will discuss anticoag plan with pt at that time.

## 2015-04-06 ENCOUNTER — Telehealth: Payer: Self-pay | Admitting: Hematology

## 2015-04-06 NOTE — Telephone Encounter (Signed)
Faxed pt medical records to Dr. Georgina Snell office.  Waiting for appt.

## 2015-04-06 NOTE — Progress Notes (Signed)
  Radiation Oncology         [phone]) [phone] ________________________________  Name: Allison Mitchell MRN: [id number]  Date: 03/02/2015  DOB: 1961/04/27  End of Treatment Note  Diagnosis:    Oncology History   Next week.Colorectal cancer, stage III   Staging form: Neuroendocrine Tumor - Colon/Rectum, AJCC 7th Edition     Clinical: Stage Unknown (T4, NX, M0) - Unsigned     Pathologic: No stage assigned - Unsigned       Colorectal cancer, stage III   10/04/2014 Tumor Marker CEA 1.0 .  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 [id number],stage IIIC.    10/10/2014 Surgery partial right hemicolectomy with removal of terminal ileum, sigmoid colectomy, right ureter lysis, (+) residual surgical margin    11/23/2014 - 01/06/2015 Chemotherapy FOLFOX every 2 weeks for 4 cycles    01/23/2015 - 03/02/2015 Radiation Therapy adjuvant radiation        Indication for treatment::  curative       Radiation treatment dates:   01/23/2015 through 03/02/2015  Site/dose:   The patient was treated to the abdomen/pelvis initially to a dose of 45 gray in 25 fractions using a 3-D conformal technique. This consisted of a 4 field technique. The patient then received a cone down boost treatment using an additional 4 customized fields to a dose of 5.4 gray. The patient's final dose was 50.4 gray.  Narrative: The patient tolerated radiation treatment relatively well.   No unexpected difficulties during treatment. Dry   Plan: The patient has completed radiation treatment. The patient will return to radiation oncology clinic for routine followup in one month. I advised the patient to call or return sooner if they have any questions or concerns related to their recovery or treatment. ________________________________  Jodelle Gross, M.D., Ph.D.

## 2015-04-06 NOTE — Progress Notes (Signed)
  Radiation Oncology         [phone] ________________________________  Name: Allison Mitchell MRN: [id number]  Date: 02/21/2015  DOB: 1960/12/21  COMPLEX SIMULATION  NOTE  Diagnosis: colorectal cancer  Narrative The patient has initially been planned to receive a course of radiation treatment to a dose of 45 gray in 25 fractions at 1.8 gray per fraction. The patient will now receive a boost to the high risk target volume for an additional 5.4 gray. This will be delivered in 3 fractions at 1.8 gray per fraction and a cone down boost technique will be utilized. To accomplish this, an additional 4 customized blocks have been designed for this purpose. A complex isodose plan is requested to ensure that the high-risk target region receives the appropriate radiation dose and that the nearby normal structures continue to be appropriately spared. The patient's final total dose therefore will be 50.4 gray.   ________________________________ ------------------------------------------------  Jodelle Gross, MD, PhD

## 2015-04-10 ENCOUNTER — Ambulatory Visit (HOSPITAL_BASED_OUTPATIENT_CLINIC_OR_DEPARTMENT_OTHER): Payer: BLUE CROSS/BLUE SHIELD | Admitting: Hematology

## 2015-04-10 ENCOUNTER — Telehealth: Payer: Self-pay | Admitting: *Deleted

## 2015-04-10 ENCOUNTER — Other Ambulatory Visit (HOSPITAL_BASED_OUTPATIENT_CLINIC_OR_DEPARTMENT_OTHER): Payer: BLUE CROSS/BLUE SHIELD

## 2015-04-10 VITALS — BP 148/79 | HR 73 | Temp 98.2°F | Resp 18 | Ht 68.0 in | Wt 210.7 lb

## 2015-04-10 DIAGNOSIS — C180 Malignant neoplasm of cecum: Secondary | ICD-10-CM

## 2015-04-10 DIAGNOSIS — C187 Malignant neoplasm of sigmoid colon: Secondary | ICD-10-CM

## 2015-04-10 DIAGNOSIS — C19 Malignant neoplasm of rectosigmoid junction: Secondary | ICD-10-CM

## 2015-04-10 DIAGNOSIS — C182 Malignant neoplasm of ascending colon: Secondary | ICD-10-CM | POA: Diagnosis not present

## 2015-04-10 DIAGNOSIS — I2699 Other pulmonary embolism without acute cor pulmonale: Secondary | ICD-10-CM | POA: Diagnosis not present

## 2015-04-10 DIAGNOSIS — C189 Malignant neoplasm of colon, unspecified: Secondary | ICD-10-CM

## 2015-04-10 DIAGNOSIS — R5383 Other fatigue: Secondary | ICD-10-CM

## 2015-04-10 DIAGNOSIS — D509 Iron deficiency anemia, unspecified: Secondary | ICD-10-CM

## 2015-04-10 DIAGNOSIS — G622 Polyneuropathy due to other toxic agents: Secondary | ICD-10-CM

## 2015-04-10 LAB — COMPREHENSIVE METABOLIC PANEL (CC13)
ALK PHOS: 115 U/L (ref 40–150)
ALT: 18 U/L (ref 0–55)
AST: 24 U/L (ref 5–34)
Albumin: 3.3 g/dL — ABNORMAL LOW (ref 3.5–5.0)
Anion Gap: 10 mEq/L (ref 3–11)
BUN: 16.6 mg/dL (ref 7.0–26.0)
CO2: 25 meq/L (ref 22–29)
CREATININE: 0.9 mg/dL (ref 0.6–1.1)
Calcium: 8.5 mg/dL (ref 8.4–10.4)
Chloride: 108 mEq/L (ref 98–109)
EGFR: 78 mL/min/{1.73_m2} — AB (ref >90)
Glucose: 117 mg/dl (ref 70–140)
POTASSIUM: 3.4 meq/L — AB (ref 3.5–5.1)
Sodium: 142 mEq/L (ref 136–145)
Total Bilirubin: 0.34 mg/dL (ref 0.20–1.20)
Total Protein: 6.2 g/dL — ABNORMAL LOW (ref 6.4–8.3)

## 2015-04-10 LAB — CBC WITH DIFFERENTIAL/PLATELET
BASO%: 0.3 % (ref 0.0–2.0)
BASOS ABS: 0 10*3/uL (ref 0.0–0.1)
EOS%: 2 % (ref 0.0–7.0)
Eosinophils Absolute: 0.1 10*3/uL (ref 0.0–0.5)
HCT: 27.3 % — ABNORMAL LOW (ref 34.8–46.6)
HEMOGLOBIN: 8.4 g/dL — AB (ref 11.6–15.9)
LYMPH#: 0.6 10*3/uL — AB (ref 0.9–3.3)
LYMPH%: 19.9 % (ref 14.0–49.7)
MCH: 25.5 pg (ref 25.1–34.0)
MCHC: 30.8 g/dL — ABNORMAL LOW (ref 31.5–36.0)
MCV: 83 fL (ref 79.5–101.0)
MONO#: 0.4 10*3/uL (ref 0.1–0.9)
MONO%: 13.3 % (ref 0.0–14.0)
NEUT#: 1.9 10*3/uL (ref 1.5–6.5)
NEUT%: 64.5 % (ref 38.4–76.8)
Platelets: 173 10*3/uL (ref 145–400)
RBC: 3.29 10*6/uL — ABNORMAL LOW (ref 3.70–5.45)
RDW: 18.2 % — ABNORMAL HIGH (ref 11.2–14.5)
WBC: 3 10*3/uL — ABNORMAL LOW (ref 3.9–10.3)
nRBC: 0 % (ref 0–0)

## 2015-04-10 LAB — PROTIME-INR
INR: 2.8 (ref 2.00–3.50)
PROTIME: 33.6 s — AB (ref 10.6–13.4)

## 2015-04-10 LAB — TECHNOLOGIST REVIEW

## 2015-04-11 ENCOUNTER — Telehealth: Payer: Self-pay | Admitting: *Deleted

## 2015-04-11 ENCOUNTER — Telehealth: Payer: Self-pay | Admitting: Hematology

## 2015-04-11 LAB — CEA: CEA: 1.5 ng/mL (ref 0.0–5.0)

## 2015-04-11 NOTE — Telephone Encounter (Signed)
per pof to sch opt appt-sent MW emai to sch pt trmt-will call pt after reply

## 2015-04-11 NOTE — Telephone Encounter (Signed)
Per staff message and POF I have scheduled appts. Advised scheduler of appts. JMW  

## 2015-04-12 ENCOUNTER — Ambulatory Visit: Payer: BLUE CROSS/BLUE SHIELD

## 2015-04-12 ENCOUNTER — Other Ambulatory Visit (HOSPITAL_BASED_OUTPATIENT_CLINIC_OR_DEPARTMENT_OTHER): Payer: BLUE CROSS/BLUE SHIELD

## 2015-04-12 ENCOUNTER — Telehealth: Payer: Self-pay | Admitting: Hematology

## 2015-04-12 ENCOUNTER — Ambulatory Visit (HOSPITAL_BASED_OUTPATIENT_CLINIC_OR_DEPARTMENT_OTHER): Payer: BLUE CROSS/BLUE SHIELD

## 2015-04-12 ENCOUNTER — Telehealth: Payer: Self-pay | Admitting: *Deleted

## 2015-04-12 VITALS — BP 161/72 | HR 74 | Temp 98.1°F | Resp 18

## 2015-04-12 DIAGNOSIS — Z5111 Encounter for antineoplastic chemotherapy: Secondary | ICD-10-CM

## 2015-04-12 DIAGNOSIS — C180 Malignant neoplasm of cecum: Secondary | ICD-10-CM

## 2015-04-12 DIAGNOSIS — C19 Malignant neoplasm of rectosigmoid junction: Secondary | ICD-10-CM

## 2015-04-12 DIAGNOSIS — I2699 Other pulmonary embolism without acute cor pulmonale: Secondary | ICD-10-CM | POA: Diagnosis not present

## 2015-04-12 DIAGNOSIS — C189 Malignant neoplasm of colon, unspecified: Secondary | ICD-10-CM

## 2015-04-12 DIAGNOSIS — C187 Malignant neoplasm of sigmoid colon: Secondary | ICD-10-CM

## 2015-04-12 DIAGNOSIS — C182 Malignant neoplasm of ascending colon: Secondary | ICD-10-CM | POA: Diagnosis not present

## 2015-04-12 DIAGNOSIS — Z95828 Presence of other vascular implants and grafts: Secondary | ICD-10-CM

## 2015-04-12 LAB — COMPREHENSIVE METABOLIC PANEL (CC13)
ALT: 18 U/L (ref 0–55)
ANION GAP: 8 meq/L (ref 3–11)
AST: 24 U/L (ref 5–34)
Albumin: 3.2 g/dL — ABNORMAL LOW (ref 3.5–5.0)
Alkaline Phosphatase: 112 U/L (ref 40–150)
BUN: 12.8 mg/dL (ref 7.0–26.0)
CALCIUM: 8.3 mg/dL — AB (ref 8.4–10.4)
CO2: 26 mEq/L (ref 22–29)
CREATININE: 0.9 mg/dL (ref 0.6–1.1)
Chloride: 111 mEq/L — ABNORMAL HIGH (ref 98–109)
EGFR: 78 mL/min/{1.73_m2} — ABNORMAL LOW (ref >90)
Glucose: 111 mg/dl (ref 70–140)
POTASSIUM: 3.7 meq/L (ref 3.5–5.1)
SODIUM: 145 meq/L (ref 136–145)
TOTAL PROTEIN: 6.1 g/dL — AB (ref 6.4–8.3)
Total Bilirubin: 0.38 mg/dL (ref 0.20–1.20)

## 2015-04-12 LAB — PROTIME-INR
INR: 3.8 — ABNORMAL HIGH (ref 2.00–3.50)
Protime: 45.6 Seconds — ABNORMAL HIGH (ref 10.6–13.4)

## 2015-04-12 LAB — POCT INR: INR: 3.8

## 2015-04-12 LAB — CBC WITH DIFFERENTIAL/PLATELET
BASO%: 0.8 % (ref 0.0–2.0)
Basophils Absolute: 0 10*3/uL (ref 0.0–0.1)
EOS%: 2.5 % (ref 0.0–7.0)
Eosinophils Absolute: 0.1 10*3/uL (ref 0.0–0.5)
HCT: 26.2 % — ABNORMAL LOW (ref 34.8–46.6)
HEMOGLOBIN: 8.3 g/dL — AB (ref 11.6–15.9)
LYMPH%: 17.5 % (ref 14.0–49.7)
MCH: 25.6 pg (ref 25.1–34.0)
MCHC: 31.8 g/dL (ref 31.5–36.0)
MCV: 80.5 fL (ref 79.5–101.0)
MONO#: 0.4 10*3/uL (ref 0.1–0.9)
MONO%: 12.7 % (ref 0.0–14.0)
NEUT#: 1.9 10*3/uL (ref 1.5–6.5)
NEUT%: 66.5 % (ref 38.4–76.8)
Platelets: 183 10*3/uL (ref 145–400)
RBC: 3.25 10*6/uL — AB (ref 3.70–5.45)
RDW: 20.9 % — ABNORMAL HIGH (ref 11.2–14.5)
WBC: 2.9 10*3/uL — ABNORMAL LOW (ref 3.9–10.3)
lymph#: 0.5 10*3/uL — ABNORMAL LOW (ref 0.9–3.3)

## 2015-04-12 MED ORDER — DEXTROSE 5 % IV SOLN
Freq: Once | INTRAVENOUS | Status: AC
  Administered 2015-04-12: 09:00:00 via INTRAVENOUS

## 2015-04-12 MED ORDER — LEUCOVORIN CALCIUM INJECTION 350 MG
400.0000 mg/m2 | Freq: Once | INTRAMUSCULAR | Status: AC
  Administered 2015-04-12: 860 mg via INTRAVENOUS
  Filled 2015-04-12: qty 43

## 2015-04-12 MED ORDER — SODIUM CHLORIDE 0.9 % IV SOLN
2400.0000 mg/m2 | INTRAVENOUS | Status: DC
  Administered 2015-04-12: 5150 mg via INTRAVENOUS
  Filled 2015-04-12: qty 103

## 2015-04-12 MED ORDER — OXALIPLATIN CHEMO INJECTION 100 MG/20ML
85.0000 mg/m2 | Freq: Once | INTRAVENOUS | Status: AC
  Administered 2015-04-12: 185 mg via INTRAVENOUS
  Filled 2015-04-12: qty 37

## 2015-04-12 MED ORDER — FLUOROURACIL CHEMO INJECTION 2.5 GM/50ML
400.0000 mg/m2 | Freq: Once | INTRAVENOUS | Status: AC
  Administered 2015-04-12: 850 mg via INTRAVENOUS
  Filled 2015-04-12: qty 17

## 2015-04-12 MED ORDER — SODIUM CHLORIDE 0.9 % IV SOLN
Freq: Once | INTRAVENOUS | Status: AC
  Administered 2015-04-12: 09:00:00 via INTRAVENOUS
  Filled 2015-04-12: qty 4

## 2015-04-12 MED ORDER — SODIUM CHLORIDE 0.9 % IJ SOLN
10.0000 mL | INTRAMUSCULAR | Status: DC | PRN
  Administered 2015-04-12: 10 mL via INTRAVENOUS
  Filled 2015-04-12: qty 10

## 2015-04-12 NOTE — Telephone Encounter (Signed)
TC from [facility] @ [place] has an appt there on 04/20/15 @ 1pm with Dr. Loetta Rough

## 2015-04-12 NOTE — Patient Instructions (Signed)
[facility] Discharge Instructions for Patients Receiving Chemotherapy  Today you received the following chemotherapy agents:  Oxaliplatin, Leucovorin and 5FU  To help prevent nausea and vomiting after your treatment, we encourage you to take your nausea medication as ordered per MD.   If you develop nausea and vomiting that is not controlled by your nausea medication, call the clinic.   BELOW ARE SYMPTOMS THAT SHOULD BE REPORTED IMMEDIATELY:  *FEVER GREATER THAN 100.5 F  *CHILLS WITH OR WITHOUT FEVER  NAUSEA AND VOMITING THAT IS NOT CONTROLLED WITH YOUR NAUSEA MEDICATION  *UNUSUAL SHORTNESS OF BREATH  *UNUSUAL BRUISING OR BLEEDING  TENDERNESS IN MOUTH AND THROAT WITH OR WITHOUT PRESENCE OF ULCERS  *URINARY PROBLEMS  *BOWEL PROBLEMS  UNUSUAL RASH Items with * indicate a potential emergency and should be followed up as soon as possible.  Feel free to call the clinic you have any questions or concerns. The clinic phone number is [phone].  Please show the CHEMO ALERT CARD at check-in to the Emergency Department and triage nurse.   

## 2015-04-12 NOTE — Telephone Encounter (Signed)
per pof tos ch pt appt-pt here and got updated avs

## 2015-04-12 NOTE — Patient Instructions (Signed)

## 2015-04-12 NOTE — Progress Notes (Signed)
*  Seen during infusion* No charge for this encounter*  Ms. Allison Mitchell's INR is 3.8 today, which is above her goal range of 2-3. Her INR was 2.8 previously two days ago. She reports no missed and/or extra doses in the last two days, and no medication or dietary changes. No bleeding and/or unusual bruising reported.  She will be having a biopsy performed on 6/13, and we have provided her with the following anticoagulation plan: 6/9 Begin holding coumadin  6/10 and 6/11  Lovenox 140mg  daily.  6/12 Hold Lovenox since 24 hours prior to biopsy   If radiology does not give post biopsy anti-coag instructions, would:  6/13 Resume coumadin 2.5mg  MWF and 5mg  other days.  6/14 Resume Lovenox 140mg  daily  6/17 Check PT/INR INR in coumadin clinic  Ms. Allison Mitchell verbalized understanding of this plan, and all questions were addressed. She knows to call our office in the meantime if any questions or issues arise.   She will return to Coumadin Clinic on 6/17: 9:00 am for lab, 9:15 for Coumadin Clinic

## 2015-04-12 NOTE — Progress Notes (Signed)
Patient's daughter graduating on Saturday morning. 5FU pump rate adjusted from 5.4 to 5.6 ml/hr per Denyse Amass, PharmD to infuse by 0800 on 04/14/15. Adjusted rate noted on MAR and on chemo bag label.

## 2015-04-13 ENCOUNTER — Other Ambulatory Visit: Payer: Self-pay | Admitting: Radiology

## 2015-04-13 ENCOUNTER — Other Ambulatory Visit (HOSPITAL_COMMUNITY): Payer: Self-pay | Admitting: Radiology

## 2015-04-13 LAB — CEA: CEA: 1.7 ng/mL (ref 0.0–5.0)

## 2015-04-14 ENCOUNTER — Ambulatory Visit (HOSPITAL_BASED_OUTPATIENT_CLINIC_OR_DEPARTMENT_OTHER): Payer: BLUE CROSS/BLUE SHIELD

## 2015-04-14 ENCOUNTER — Encounter: Payer: Self-pay | Admitting: Hematology

## 2015-04-14 VITALS — BP 123/60 | HR 84 | Temp 97.9°F | Resp 20

## 2015-04-14 DIAGNOSIS — C187 Malignant neoplasm of sigmoid colon: Secondary | ICD-10-CM | POA: Diagnosis not present

## 2015-04-14 DIAGNOSIS — C182 Malignant neoplasm of ascending colon: Secondary | ICD-10-CM | POA: Diagnosis not present

## 2015-04-14 DIAGNOSIS — Z5189 Encounter for other specified aftercare: Secondary | ICD-10-CM | POA: Diagnosis not present

## 2015-04-14 DIAGNOSIS — C180 Malignant neoplasm of cecum: Secondary | ICD-10-CM

## 2015-04-14 DIAGNOSIS — C19 Malignant neoplasm of rectosigmoid junction: Secondary | ICD-10-CM

## 2015-04-14 MED ORDER — SODIUM CHLORIDE 0.9 % IJ SOLN
10.0000 mL | INTRAMUSCULAR | Status: DC | PRN
  Administered 2015-04-14: 10 mL
  Filled 2015-04-14: qty 10

## 2015-04-14 MED ORDER — PEGFILGRASTIM INJECTION 6 MG/0.6ML ~~LOC~~
6.0000 mg | PREFILLED_SYRINGE | Freq: Once | SUBCUTANEOUS | Status: AC
  Administered 2015-04-14: 6 mg via SUBCUTANEOUS

## 2015-04-14 MED ORDER — HEPARIN SOD (PORK) LOCK FLUSH 100 UNIT/ML IV SOLN
500.0000 [IU] | Freq: Once | INTRAVENOUS | Status: AC | PRN
  Administered 2015-04-14: 500 [IU]
  Filled 2015-04-14: qty 5

## 2015-04-14 NOTE — Progress Notes (Signed)
[name]  [phone] Fax:([phone]) [place] Note   Patient Care Team: [name], MD as PCP - General (Family Medicine)  CHIEF COMPLAINTS:  Follow-up colon cancer  Oncology History   Next week.Colorectal cancer, stage III   Staging form: Neuroendocrine Tumor - Colon/Rectum, AJCC 7th Edition     Clinical: Stage Unknown (T4, NX, M0) - Unsigned     Pathologic: No stage assigned - Unsigned       Colorectal cancer, stage III   10/04/2014 Tumor Marker CEA 1.0 .  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 mpT4aN2aM0,stage IIIC.    10/10/2014 Surgery partial right hemicolectomy with removal of terminal ileum, sigmoid colectomy, right ureter lysis, (+) residual surgical margin    11/23/2014 - 01/06/2015 Chemotherapy FOLFOX every 2 weeks for 4 cycles    01/23/2015 - 03/02/2015 Radiation Therapy adjuvant radiation   03/27/2015 Progression CT chest, abdomen and pelvis showed new capsular nodules along the posterior margin of hepatic segment 6, measuring 2.1X1.1cm, and then you omental nodule, suspicious for new metastasis.   03/29/2015 -  Chemotherapy Restarted FOLFOX every 2 weeks   OTHER ISSUES: 1. Pulmonary embolism in segment omental branches of the right lung, diagnosed on 11/28/2014 2. Right ureter stent placed on 10/20/2014  CURRENT THERAPY: FOLFOX every 2 weeks, started on 11/23/2014, restarted on 03/29/2015 after radiation   HISTORY OF PRESENTING ILLNESS:  Allison Mitchell 54 y.o. female is here for follow-up after her hospital discharge. She was recently diagnosed with 3 synchronous to colon cancer, status post surgical resection. I initially saw her when she was diagnosed in the hospital.  She presented with some GI symptoms since last October 2014 at which time she  was diagnosed with ischemic colitis. Due to guaiac-positive stools, patient had undergone an outpatient colonoscopy on 09/26/2014, which showed inflamed and ulcerated mucosa in the terminal ileum, and a 1.5 cm mass in the ascending colon and additional 1.5 cm mass in the mid sigmoid colon. All biopsy from the above 3 sites came back positive for invasive moderate differentiated adenocarcinoma, and the sigmoid mass containing squamous cell differentiation.   She was hospitalized for worsening of her symptoms on 10/03/2014.  A CT of the abdomen and pelvis with contrast on 10/02/2014 was remarkable for soft tissue/bowel wall thickening of the right lower pelvis center at the cecum-terminal ileum involving a portion of the adjacent sigmoid colon. This causes small bowel obstruction and obstructed the distal right ureter causing moderate to severe right hydroureteronephrosis. She was [facility] to [facility] for surgical management and further testing. CT of the chest with contrast on 10/03/2014 negative for metastatic disease. MRI of the liver without and without contrast on 10/03/2014 is remarkable for small hepatic cysts, but no findings suspicious for hepatic metastatic disease. Right-sided hydroureteronephrosis was once again seen, with early small bowel obstruction bowel gas pattern.  She underwent right hemicolectomy with primary anastomosis with sigmoid colectomy with end colostomy on Friday, Dec 5, after cystoscopy is performed on 12/4 to rule out GU invasion and a ureteral stent placement.  She has family history of colon cancer in her grandmother and polyps in her sister. Denies risk factors for HIV or hepatitis. Denies Diabetes. No Tobacco. No ETOH. Denies prior radiation. SHe did consume significant amount of red meat until recently.  We are asked to see the patient in consultation, with recommendations on the oncology standpoint  She is recovering slowly after the  surgery. She had the surgical suture opened about 3 weeks ago and was treated for antibiotics , and now much improved. She has no significant pain, but mild abdominal discomfort, no nausea, her stool is getting thicker, she clean her colostomy bag daily. She was seen by Dr. Lindie Spruce this morning. She noticed some cloudy urine in the past few days, with mild burning sensation, no fever or back pain. Her appetite is getting better, eats well.  She lost about 25 lbs. No fever or chills. No other new complains.   INTERIM HISTORY: Allison Mitchell returns for follow-up and cycle 6 chemotherapy. Her right flank pain is resolved now, she did have slight pain after cleaning her house a few days ago. She otherwise feels well, denies other new symptoms. She tolerated the chemotherapy 2 weeks ago well, with mild to moderate fatigue. She is scheduled to have a liver biopsy next week.  MEDICAL HISTORY:  Past Medical History  Diagnosis Date  . GERD (gastroesophageal reflux disease)   . Depression   . Anxiety   . 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 Peripheral neuropathy due to chemotherapy   . Hydronephrosis, right     malignant--  s/p reimiplant right ureter  . History of pulmonary embolus (PE)     11-28-2014--  due to chemotherapy on coumadin  . Anticoagulated on Coumadin   . Colorectal cancer, stage III oncologist--  dr Jodell Cipro    Dx  10-10-2014---Stage IIIC, mpT4aN2aM0, Multifocal synchronous, Moderate differentiated Invasive, Mets to nodes (5 out of 17)/  s/p  Resection tumor cecum, terminal ileum, sigmoid/  currently chemoradiation therapy  . Dysuria   . S/P radiation therapy 03/02/15    completed pelvis/abd/50.4Gy    SURGICAL HISTORY: Past Surgical History  Procedure Laterality Date  . Cystoscopy with retrograde pyelogram, ureteroscopy and stent placement Right 10/05/2014    Procedure: CYSTOSCOPY WITH RETROGRADE PYELOGRAM, URETEROSCOPY  AND STENT PLACEMENT;  Surgeon: Sebastian Ache,  MD;  Location: [facility] ORS;  Service: Urology;  Laterality: Right;  . Laparotomy N/A 10/10/2014    Procedure: EXPLORATORY LAPAROTOMY;  Surgeon: Frederik Schmidt, MD;  Location: [facility] OR;  Service: General;  Laterality: N/A;  . Partial colectomy  10/10/2014    Procedure: PARTIAL SIGMOID COLECTOMY;  Surgeon: Frederik Schmidt, MD;  Location: [facility] OR;  Service: General;;  . Colostomy  10/10/2014    Procedure: COLOSTOMY;  Surgeon: Frederik Schmidt, MD;  Location: [facility] OR;  Service: General;;  . Colostomy revision  10/10/2014    Procedure: PARTIAL RIGHT COLECTOMY;  Surgeon: Frederik Schmidt, MD;  Location: [facility] OR;  Service: General;;  . Ureteral reimplantion Right 10/10/2014    Procedure: OPEN RIGHT URETERAL LYSIS ;  Surgeon: Sebastian Ache, MD;  Location: [facility] OR;  Service: Urology;  Laterality: Right;  . Portacath placement N/A 10/17/2014    Procedure: INSERTION PORT-A-CATH LEFT SUBCLAVIAN AND MIDLINE INCISION STAPLE REMOVAL ;  Surgeon: Abigail Miyamoto, MD;  Location: [facility] OR;  Service: General;  Laterality: N/A;  . Cystoscopy w/ retrogrades Right 01/10/2015    Procedure: CYSTOSCOPY WITH RETROGRADE PYELOGRAM;  Surgeon: Sebastian Ache, MD;  Location: [facility];  Service: Urology;  Laterality: Right;  . Cystoscopy w/ ureteral stent placement Right 01/10/2015    Procedure: CYSTOSCOPY WITH STENT REPLACEMENT;  Surgeon: Sebastian Ache, MD;  Location: [facility];  Service: Urology;  Laterality: Right;    SOCIAL HISTORY: History   Social History  . Marital Status: Married    Spouse Name: N/A  . Number of Children: N/A  . Years of Education: N/A   Occupational History  . Not on file.   Social History Main Topics  . Smoking status: Never Smoker   . Smokeless tobacco: Never Used  . Alcohol Use: No  . Drug Use: No  . Sexual Activity: No   Other Topics Concern  . 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 Cancer Mother     skin cancer   . Diabetes Father   . CAD Father     Onset in his 51s, has 7 stents  . Cancer Maternal Grandmother 53    colon cancer   . Cancer Paternal Grandmother 40    breast cancer     ALLERGIES:  is allergic to amoxicillin.  MEDICATIONS:  Current Outpatient Prescriptions  Medication Sig Dispense Refill  . citalopram (CELEXA) 20 MG tablet Take 20-40 mg by mouth every morning.   1  . lidocaine-prilocaine (EMLA) cream Apply 1 application topically as needed. 30 g 0  . magnesium hydroxide (MILK OF MAGNESIA) 400 MG/5ML suspension Take 5 mLs by mouth daily as needed for mild constipation.    . Meth-Hyo-M Bl-Na Phos-Ph Sal (URIBEL) 118 MG CAPS Take 1 capsule by mouth every 8 (eight) hours as needed (pain). Every 8 hours as needed  11  . omeprazole (PRILOSEC) 40 MG capsule Take 40 mg by mouth daily as needed (heart burn.).   12  . ondansetron (ZOFRAN) 8 MG tablet Take 1 tablet (8 mg total) by mouth every 8 (eight) hours as needed for nausea or vomiting. 20 tablet 3  . PREVIDENT 5000 BOOSTER PLUS 1.1 % PSTE 1 each by Other route daily.   5  . traMADol (ULTRAM) 50 MG tablet TAKE ONE TABLET EVERY 6 HOURS AS NEEDED FOR PAIN (Patient not taking: Reported on 04/13/2015) 30 tablet 0  . warfarin (COUMADIN) 5 MG tablet Take as directed by coumadin clinic (Patient taking differently: Take 5 mg by mouth. Take one tablet ($RemoveBef'5mg'spIagxsmvC$ ) on MWF and half a tablet (2.$RemoveBef'5mg'xBCwqmxcZh$ ) on all other days.) 30 tablet 3  . zolpidem (AMBIEN) 5 MG tablet Take 1 tablet (5 mg total) by mouth at bedtime as needed for sleep. 30 tablet 0  . enoxaparin (LOVENOX) 150 MG/ML injection Inject 140 mg into the skin daily.    Marland Kitchen escitalopram (LEXAPRO) 10 MG tablet Take 10 mg by mouth daily.     No current facility-administered medications for this visit.    REVIEW OF SYSTEMS:   Constitutional: Denies fevers, chills or abnormal night sweats Eyes: Denies  blurriness of vision, double vision or watery eyes Ears, nose, mouth, throat, and face: Denies mucositis or sore throat Respiratory: Denies cough, dyspnea or wheezes Cardiovascular: Denies palpitation, chest discomfort or lower extremity swelling Gastrointestinal:  Denies nausea, heartburn or change in bowel habits Skin: Denies abnormal skin rashes Lymphatics: Denies new lymphadenopathy or easy bruising Neurological:Denies numbness, tingling or new weaknesses Behavioral/Psych: Mood is stable, no new changes  All other systems were reviewed with the patient and are negative.  PHYSICAL EXAMINATION: ECOG PERFORMANCE STATUS: 1 - Symptomatic but completely ambulatory  Filed Vitals:  04/10/15 1325  BP: 148/79  Pulse: 73  Temp: 98.2 F (36.8 C)  Resp: 18   Filed Weights   04/10/15 1325  Weight: 210 lb 11.2 oz (95.573 kg)    GENERAL:alert, no distress and comfortable SKIN: skin color, texture, turgor are normal, no rashes or significant lesions EYES: normal, conjunctiva are pink and non-injected, sclera clear OROPHARYNX:no exudate, no erythema and lips, buccal mucosa, and tongue normal  NECK: supple, thyroid normal size, non-tender, without nodularity LYMPH:  no palpable lymphadenopathy in the cervical, axillary or inguinal LUNGS: clear to auscultation and percussion with normal breathing effort HEART: regular rate & rhythm and no murmurs and no lower extremity edema ABDOMEN:abdomen soft, non-tender and normal bowel sounds. Positive for colostomy bag on the left, with normal-appearing stool. Surgical wound upper part is well-healed. Musculoskeletal:no cyanosis of digits and no clubbing  PSYCH: alert & oriented x 3 with fluent speech NEURO: no focal motor/sensory deficits  LABORATORY DATA:  CBC Latest Ref Rng 04/12/2015 04/10/2015 03/28/2015  WBC 3.9 - 10.3 10e3/uL 2.9(L) 3.0(L) 3.6(L)  Hemoglobin 11.6 - 15.9 g/dL 8.3(L) 8.4(L) 8.8(L)  Hematocrit 34.8 - 46.6 % 26.2(L) 27.3(L) 27.6(L)    Platelets 145 - 400 10e3/uL 183 173 Occ Large platelets present 230    CMP Latest Ref Rng 04/12/2015 04/10/2015 03/28/2015  Glucose 70 - 140 mg/dl 111 117 102  BUN 7.0 - 26.0 mg/dL 12.8 16.6 14.4  Creatinine 0.6 - 1.1 mg/dL 0.9 0.9 0.8  Sodium 136 - 145 mEq/L 145 142 142  Potassium 3.5 - 5.1 mEq/L 3.7 3.4(L) 3.8  Chloride 96 - 112 mmol/L - - -  CO2 22 - 29 mEq/L 26 25 20(L)  Calcium 8.4 - 10.4 mg/dL 8.3(L) 8.5 8.2(L)  Total Protein 6.4 - 8.3 g/dL 6.1(L) 6.2(L) 6.3(L)  Total Bilirubin 0.20 - 1.20 mg/dL 0.38 0.34 0.29  Alkaline Phos 40 - 150 U/L 112 115 110  AST 5 - 34 U/L $Remo'24 24 21  'oPAso$ ALT 0 - 55 U/L $Remo'18 18 14     'imyGI$ Surgical path 10/10/2014 1. Colon, segmental resection for tumor, Cecum, terminal ileum, sigmoid colon - MULTIFOCAL INVASIVE ADENOCARCINOMA, MODERATELY DIFFERENTIATED, THE LARGEST FOCUS SPANS 4.0 CM. - ADENOCARCINOMA INVOLVES SEROSA. - LYMPHOVASCULAR INVASION IS IDENTIFIED. - METASTATIC CARCINOMA IN 5 OF 17 LYMPH NODES (5/17). - THE PROXIMAL AND DISTAL SURGICAL RESECTION MARGINS ARE NEGATIVE FOR ADENOCARCINOMA. - SEE ONCOLOGY TABLE BELOW. 2. Soft tissue, biopsy, Right periureteral tissue - ADENOCARCINOMA. 3. Soft tissue, biopsy, Right periureteral tissue - INFLAMED AND DEGENERATING FIBROADIPOSE TISSUE. - THERE IS NO EVIDENCE OF MALIGNANCY. Microscopic Comment 1. COLON AND RECTUM (INCLUDING TRANS-ANAL RESECTION): Specimen: Distal ileum, cecum, proximal ascending colon, and adherent sigmoid colon. Procedure: Resection. Tumor site: Multiple foci, including ileocecal valve, ascending colon and sigmoid. Specimen integrity: Disrupted. Macroscopic intactness of mesorectum: N/A Macroscopic tumor perforation: Present. Invasive tumor: Maximum size: 4.0 cm. Histologic type(s): Adenocarcinoma. Histologic grade and differentiation: G2: moderately differentiated Type of polyp in which invasive carcinoma arose: Tubulovillous adenomas. Microscopic extension of invasive tumor: Focally  involves the serosa. Lymph-Vascular invasion: Present. Peri-neural invasion: Not identified. Tumor deposit(s) (discontinuous extramural extension): Not identified. Resection margins: Proximal margin: 4.0 cm Distal margin: 3.5 cm Circumferential (radial) (posterior ascending, posterior descending; lateral and posterior mid-rectum; and entire lower 1/3 rectum): Cannot be assessed. Mesenteric margin (sigmoid and transverse): Cannot be assessed. Treatment effect (neo-adjuvant therapy): N/A Additional polyp(s): Not identified. Non-neoplastic findings: No significant findings. Lymph nodes: number examined 17; number positive: 5 Pathologic Staging: mpT4a, pN2a, pMX Ancillary studies: A  block will be sent for MSI testing by [facility] and MSI testing by PCR and the results reported separately. Comment: There appear to be three synchronous primary tumors in the specimen, one at the ileocecal valve, one in the ascending colon, and one at the sigmoid colon. Each of these foci contain an associated tubulovillous adenoma, suggesting three separate primary sites. The tumor in the sigmoid colon appears to involve the serosa as well. The other two tumors show extension into at least the pericolonic soft tissue. Dr Mali Rund has reviewed selected slides and concurs that there are likely three synchronous tumors here. The case was discussed with Dr Hulen Skains. Per Dr Hulen Skains, the specimen in #2 is a contiguous piece of tissue 2 of 4 Amended copy Amended FINAL for [id number], BUIST [phone]) Microscopic Comment(continued) with the main tumor, and therefore, not considered a distant metastatic focus. Therefore, the tumor is staged as an MX. (JBK:ecj 10/16/2014) JOSHUA  Mismatch Repair (MMR) Protein Immunohistochemistry (IHC) IHC Expression Result: MLH1: Preserved nuclear expression (greater 50% tumor expression) MSH2: Preserved nuclear expression (greater 50% tumor expression) MSH6: Preserved nuclear expression  (greater 50% tumor expression) PMS2: Preserved nuclear expression (greater 50% tumor expression) * Internal control demonstrates intact nuclear expression Interpretation: NORMAL There is preserved expression  RADIOGRAPHIC STUDIES: I have personally reviewed the radiological images as listed and agreed with the findings in the report.  CT of chest, abdomen and pelvis 03/27/2015  IMPRESSION: 1. Suspicion for new peritoneal metastatic disease including a 2.1 by 1.1 cm capsular nodule along the posterior margin of hepatic segment 6, and a new small right anterior omental nodule. 2. Several additional hypodense tiny lesions in the liver are stable. 3. Moderate right hydronephrosis and hydroureter with periureteral stranding, despite the presence of the double-J ureteral stent. However, there is no asymmetry in enhancement or excretion of the kidneys. 4. Prominence of stool in the colon extending to the colostomy site. 5. 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  ASSESSMENT & PLAN:  54 year old Caucasian female with minimal past medical history, who was found to have 3 multifocal colon cancer at terminal ileum/cecum, ascending colon and sigmoid colon. All of them has adenocarcinoma,  And the cecum tumor has squamous differentiation. The cecum tumor has directly invaded the soft tissue along the right ureter, which was not completely resected (1% tumor left over, per Dr. Hulen Skains). She has mpT4aN2aM0 stage IIIB, MSI/MMR normal.   1. 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 Unfortunately, the tumor was not completely resected. She has very small amount of tumor left over. -Given  her residual tumor after surgery, advanced stage III disease, high risk recurrence in the future,  we recommended sequential adjuvant chemotherapy and irradiation -Her restaging scan after radiation unfortunately showed  probable peritoneal metastasis in liver and omentum. The scan results was reviewed with patient and her husband. I have referred her to see Dr. Epifanio Lesches at the [facility] to consider HIPEC.  -liver biopsy next week, we'll transition her Coumadin to Lovenox  -Lab reviewed, adequate for treatment, proceed cycle 6 FOLFOX today  2. PE -She was on Lovenox injection for a month then switched to oral anticoagulant Coumadin.  -Follow-up with Coumadin clinic.   3. Right pelvic cyst on CT 11/14/2014 -This has  resolved on the recent CT scan.  3. right ureter stent placement -She will follow up with her urologist.  -her urinary symptoms likely related to the stent, previous multiple culture were negative. - okay to take Tylenol for pain.  5. 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 Her iron study is consistent with iron deficient anemia -This is secondary to the bleeding from her colon cancer. -She received IV Feraheme $RemoveBef'510mg'dxFBanjPeD$  twice.   6.G1 peripheral neuropathy, fatigue  -Secondary to chemotherapy, improved  -Continue close monitoring  Plan, -Cycle 6 FOLFOX today -Liver biopsy June 13 by IR, Lovenox bridging before and after biopsy.    All questions were answered. The patient knows to call the clinic with any problems, questions or concerns.  I spent 20 minutes counseling the patient face to face. The total time spent in the appointment was 25 minutes and more than 50% was on counseling.     Truitt Merle, MD 04/14/2015 2:49 PM

## 2015-04-16 ENCOUNTER — Encounter (HOSPITAL_COMMUNITY): Payer: Self-pay

## 2015-04-16 ENCOUNTER — Other Ambulatory Visit: Payer: Self-pay | Admitting: *Deleted

## 2015-04-16 ENCOUNTER — Telehealth: Payer: Self-pay | Admitting: Hematology

## 2015-04-16 ENCOUNTER — Ambulatory Visit (HOSPITAL_COMMUNITY)
Admission: RE | Admit: 2015-04-16 | Discharge: 2015-04-16 | Disposition: A | Discharge status: Home / Self Care | Payer: BLUE CROSS/BLUE SHIELD | Source: Ambulatory Visit | Attending: Nurse Practitioner | Admitting: Nurse Practitioner

## 2015-04-16 DIAGNOSIS — K769 Liver disease, unspecified: Secondary | ICD-10-CM | POA: Diagnosis present

## 2015-04-16 DIAGNOSIS — C188 Malignant neoplasm of overlapping sites of colon: Secondary | ICD-10-CM | POA: Diagnosis not present

## 2015-04-16 DIAGNOSIS — Z86711 Personal history of pulmonary embolism: Secondary | ICD-10-CM | POA: Insufficient documentation

## 2015-04-16 DIAGNOSIS — M545 Low back pain: Secondary | ICD-10-CM | POA: Diagnosis not present

## 2015-04-16 DIAGNOSIS — I2699 Other pulmonary embolism without acute cor pulmonale: Secondary | ICD-10-CM

## 2015-04-16 DIAGNOSIS — N1330 Unspecified hydronephrosis: Secondary | ICD-10-CM | POA: Diagnosis not present

## 2015-04-16 DIAGNOSIS — K219 Gastro-esophageal reflux disease without esophagitis: Secondary | ICD-10-CM | POA: Insufficient documentation

## 2015-04-16 DIAGNOSIS — Z923 Personal history of irradiation: Secondary | ICD-10-CM | POA: Insufficient documentation

## 2015-04-16 DIAGNOSIS — C787 Secondary malignant neoplasm of liver and intrahepatic bile duct: Secondary | ICD-10-CM | POA: Diagnosis not present

## 2015-04-16 DIAGNOSIS — Z7901 Long term (current) use of anticoagulants: Secondary | ICD-10-CM | POA: Insufficient documentation

## 2015-04-16 DIAGNOSIS — Z79899 Other long term (current) drug therapy: Secondary | ICD-10-CM | POA: Diagnosis not present

## 2015-04-16 DIAGNOSIS — C19 Malignant neoplasm of rectosigmoid junction: Secondary | ICD-10-CM

## 2015-04-16 LAB — CBC
HCT: 27 % — ABNORMAL LOW (ref 36.0–46.0)
HEMOGLOBIN: 8 g/dL — AB (ref 12.0–15.0)
MCH: 25.1 pg — ABNORMAL LOW (ref 26.0–34.0)
MCHC: 29.6 g/dL — ABNORMAL LOW (ref 30.0–36.0)
MCV: 84.6 fL (ref 78.0–100.0)
PLATELETS: 133 10*3/uL — AB (ref 150–400)
RBC: 3.19 MIL/uL — ABNORMAL LOW (ref 3.87–5.11)
RDW: 18 % — AB (ref 11.5–15.5)
WBC: 6.5 10*3/uL (ref 4.0–10.5)

## 2015-04-16 LAB — PROTIME-INR
INR: 1.2 (ref 0.00–1.49)
Prothrombin Time: 15.3 seconds — ABNORMAL HIGH (ref 11.6–15.2)

## 2015-04-16 LAB — APTT: aPTT: 26 seconds (ref 24–37)

## 2015-04-16 MED ORDER — OXYCODONE HCL 5 MG PO TABS: 5.0000 mg | ORAL_TABLET | Freq: Four times a day (QID) | ORAL | Status: DC | PRN

## 2015-04-16 MED ORDER — HYDROCODONE-ACETAMINOPHEN 5-325 MG PO TABS
1.0000 | ORAL_TABLET | ORAL | Status: DC | PRN
  Administered 2015-04-16: 1 via ORAL
  Filled 2015-04-16: qty 1
  Filled 2015-04-16 (×2): qty 2

## 2015-04-16 MED ORDER — HEPARIN SOD (PORK) LOCK FLUSH 100 UNIT/ML IV SOLN
500.0000 [IU] | INTRAVENOUS | Status: AC | PRN
  Administered 2015-04-16: 500 [IU]
  Filled 2015-04-16: qty 5

## 2015-04-16 MED ORDER — NALOXONE HCL 0.4 MG/ML IJ SOLN
INTRAMUSCULAR | Status: AC
  Filled 2015-04-16: qty 1

## 2015-04-16 MED ORDER — MIDAZOLAM HCL 2 MG/2ML IJ SOLN
INTRAMUSCULAR | Status: AC
  Filled 2015-04-16: qty 4

## 2015-04-16 MED ORDER — FENTANYL CITRATE (PF) 100 MCG/2ML IJ SOLN
INTRAMUSCULAR | Status: AC | PRN
  Administered 2015-04-16: 25 ug via INTRAVENOUS
  Administered 2015-04-16: 50 ug via INTRAVENOUS

## 2015-04-16 MED ORDER — FLUMAZENIL 0.5 MG/5ML IV SOLN
INTRAVENOUS | Status: AC
  Filled 2015-04-16: qty 5

## 2015-04-16 MED ORDER — MIDAZOLAM HCL 2 MG/2ML IJ SOLN
INTRAMUSCULAR | Status: AC | PRN
  Administered 2015-04-16: 0.5 mg via INTRAVENOUS
  Administered 2015-04-16: 1 mg via INTRAVENOUS

## 2015-04-16 MED ORDER — SODIUM CHLORIDE 0.9 % IV SOLN
Freq: Once | INTRAVENOUS | Status: AC
  Administered 2015-04-16: 12:00:00 via INTRAVENOUS

## 2015-04-16 MED ORDER — FENTANYL CITRATE (PF) 100 MCG/2ML IJ SOLN
INTRAMUSCULAR | Status: AC
  Filled 2015-04-16: qty 2

## 2015-04-16 MED ORDER — SODIUM CHLORIDE 0.9 % IJ SOLN
10.0000 mL | Freq: Once | INTRAMUSCULAR | Status: AC
  Administered 2015-04-16: 10 mL

## 2015-04-16 NOTE — Procedures (Signed)
[place] 18g core x3 Liver lesion biopsy, to surg path No complication No blood loss. See complete dictation in [facility].

## 2015-04-16 NOTE — Discharge Instructions (Signed)
°Liver Biopsy, Care After °These instructions give you information on caring for yourself after your procedure. Your doctor may also give you more specific instructions. Call your doctor if you have any problems or questions after your procedure. °HOME CARE °· Rest at home for 1-2 days or as told by your doctor. °· Have someone stay with you for at least 24 hours. °· Do not do these things in the first 24 hours: °¨ Drive. °¨ Use machinery. °¨ Take care of other people. °¨ Sign legal documents. °¨ Take a bath or shower. °· There are many different ways to close and cover a cut (incision). For example, a cut can be closed with stitches, skin glue, or adhesive strips. Follow your doctor's instructions on: °¨ Taking care of your cut. °¨ Changing and removing your bandage (dressing). °¨ Removing whatever was used to close your cut. °· Do not drink alcohol in the first week. °· Do not lift more than 5 pounds or play contact sports for the first 2 weeks. °· Take medicines only as told by your doctor. For 1 week, do not take medicine that has aspirin in it or medicines like ibuprofen. °· Get your test results. °GET HELP IF: °· A cut bleeds and leaves more than just a small spot of blood. °· A cut is red, puffs up (swells), or hurts more than before. °· Fluid or something else comes from a cut. °· A cut smells bad. °· You have a fever or chills. °GET HELP RIGHT AWAY IF: °· You have swelling, bloating, or pain in your belly (abdomen). °· You get dizzy or faint. °· You have a rash. °· You feel sick to your stomach (nauseous) or throw up (vomit). °· You have trouble breathing, feel short of breath, or feel faint. °· Your chest hurts. °· You have problems talking or seeing. °· You have trouble balancing or moving your arms or legs. °Document Released: 07/29/2008 Document Revised: 03/06/2014 Document Reviewed: 12/16/2013 °[employer]® Patient Information ©2015 [employer]. This information is not intended to replace advice given  to you by your health care provider. Make sure you discuss any questions you have with your health care provider. °Liver Biopsy °The liver is a large organ in the upper right-hand side of your abdomen. A liver biopsy is a procedure in which a tissue sample is taken from the liver and examined under a microscope. The procedure is done to confirm a suspected problem. °There are three types of liver biopsies: °· Percutaneous. In this type, an incision is made in your abdomen. The sample is removed through the incision with a needle. °· Laparoscopic. In this type, several incisions are made in the abdomen. A tiny camera is passed through one of the incisions to help guide the health care provider. The sample is removed through the other incision or incisions. °· Transjugular. In this type, an incision is made in the neck. A tube is passed through the incision to the liver. The sample is removed through the tube with a needle. °LET YOUR HEALTH CARE PROVIDER KNOW ABOUT: °· Any allergies you have. °· All medicines you are taking, including vitamins, herbs, eye drops, creams, and over-the-counter medicines. °· Previous problems you or members of your family have had with the use of anesthetics. °· Any blood disorders you have. °· Previous surgeries you have had. °· Medical conditions you have. °· Possibility of pregnancy, if this applies. °RISKS AND COMPLICATIONS °Generally, this is a safe procedure. However, problems can   occur and include: °· Bleeding. °· Infection. °· Bruising. °· Collapsed lung. °· Leak of digestive juices (bile) from the liver or gallbladder. °· Problems with heart rhythm. °· Pain at the biopsy site or in the right shoulder. °· Low blood pressure (hypotension). °· Injury to nearby organs or tissues. °BEFORE THE PROCEDURE °· Your health care provider may do some blood or urine tests. These will help your health care provider learn how well your kidneys and liver are working and how well your blood  clots. °· Ask your health care provider if you will be able to go home the day of the procedure. Arrange for someone to take you home and stay with you for at least 24 hours. °· Do not eat or drink anything after midnight on the night before the procedure or as directed by your health care provider. °· Ask your health care provider about: °¨ Changing or stopping your regular medicines. This is especially important if you are taking diabetes medicines or blood thinners. °¨ Taking medicines such as aspirin and ibuprofen. These medicines can thin your blood. Do not take these medicines before your procedure if your health care provider asks you not to. °PROCEDURE °Regardless of the type of biopsy that will be done, you will have an IV line placed. Through this line, you will receive fluids and medicine to relax you. If you will be having a laparoscopic biopsy, you may also receive medicine through this line to make you sleep during the procedure (general anesthetic). °Percutaneous Liver Biopsy °· You will positioned on your back, with your right hand over your head. °· A health care provider will locate your liver by tapping and pressing on the right side of your abdomen or with the help of an ultrasound machine or CT scan. °· An area at the bottom of your last right rib will be numbed. °· An incision will be made in the numbed area. °· The biopsy needle will be inserted into the incision. °· Several samples of liver tissue will be taken with the biopsy needle. You will be asked to hold your breath as each sample is taken. °Laparoscopic Liver Biopsy °· You will be positioned on your back. °· Several small incisions will be made in your abdomen. °· Your doctor will pass a tiny camera through one incision. The camera will allow the liver to be viewed on a TV monitor in the operating room. °· Tools will be passed through the other incision or incisions. These tools will be used to remove samples of liver  tissue. °Transjugular Liver Biopsy °· You will be positioned on your back on an X-ray table, with your head turned to your left. °· An area on your neck just over your jugular vein will be numbed. °· An incision will be made in the numbed area. °· A tiny tube will be inserted through the incision. It will be pushed through the jugular vein to a blood vessel in the liver called the hepatic vein. °· Dye will be inserted through the tube, and X-rays will be taken. The dye will make the blood vessels in the liver light up on the X-rays. °· The biopsy needle will be pushed through the tube until it reaches the liver. °· Samples of liver tissue will be taken with the biopsy needle. °· The needle and the tube will be removed. °After the samples are obtained, the incision or incisions will be closed. °AFTER THE PROCEDURE °· You will be taken to   a recovery area. °· You may have to lie on your right side for 1-2 hours. This will prevent bleeding from the biopsy site. °· Your progress will be watched. Your blood pressure, pulse, and the biopsy site will be checked often. °· You may have some pain or feel sick. If this happens, tell your health care provider. °· As you begin to feel better, you will be offered ice and beverages. °· You may be allowed to go home when the medicines have worn off and you can walk, drink, eat, and use the bathroom. °Document Released: 01/10/2004 Document Revised: 03/06/2014 Document Reviewed: 12/16/2013 °[employer]® Patient Information ©2015 [employer]. This information is not intended to replace advice given to you by your health care provider. Make sure you discuss any questions you have with your health care provider. °Conscious Sedation °Sedation is the use of medicines to promote relaxation and relieve discomfort and anxiety. Conscious sedation is a type of sedation. Under conscious sedation you are less alert than normal but are still able to respond to instructions or stimulation. Conscious  sedation is used during short medical and dental procedures. It is milder than deep sedation or general anesthesia and allows you to return to your regular activities sooner.  °LET YOUR HEALTH CARE PROVIDER KNOW ABOUT:  °· Any allergies you have. °· All medicines you are taking, including vitamins, herbs, eye drops, creams, and over-the-counter medicines. °· Use of steroids (by mouth or creams). °· Previous problems you or members of your family have had with the use of anesthetics. °· Any blood disorders you have. °· Previous surgeries you have had. °· Medical conditions you have. °· Possibility of pregnancy, if this applies. °· Use of cigarettes, alcohol, or illegal drugs. °RISKS AND COMPLICATIONS °Generally, this is a safe procedure. However, as with any procedure, problems can occur. Possible problems include: °· Oversedation. °· Trouble breathing on your own. You may need to have a breathing tube until you are awake and breathing on your own. °· Allergic reaction to any of the medicines used for the procedure. °BEFORE THE PROCEDURE °· You may have blood tests done. These tests can help show how well your kidneys and liver are working. They can also show how well your blood clots. °· A physical exam will be done.   °· Only take medicines as directed by your health care provider. You may need to stop taking medicines (such as blood thinners, aspirin, or nonsteroidal anti-inflammatory drugs) before the procedure.   °· Do not eat or drink at least 6 hours before the procedure or as directed by your health care provider. °· Arrange for a responsible adult, family member, or friend to take you home after the procedure. He or she should stay with you for at least 24 hours after the procedure, until the medicine has worn off. °PROCEDURE  °· An intravenous (IV) catheter will be inserted into one of your veins. Medicine will be able to flow directly into your body through this catheter. You may be given medicine through  this tube to help prevent pain and help you relax. °· The medical or dental procedure will be done. °AFTER THE PROCEDURE °· You will stay in a recovery area until the medicine has worn off. Your blood pressure and pulse will be checked.   °·  Depending on the procedure you had, you may be allowed to go home when you can tolerate liquids and your pain is under control. °Document Released: 07/15/2001 Document Revised: 10/25/2013 Document Reviewed: 06/27/2013 °[employer]® Patient   Information 2015 [place], [place]. This information is not intended to replace advice given to you by your health care provider. Make sure you discuss any questions you have with your health care provider. Conscious Sedation, Adult, Care After Refer to this sheet in the next few weeks. These instructions provide you with information on caring for yourself after your procedure. Your health care provider may also give you more specific instructions. Your treatment has been planned according to current medical practices, but problems sometimes occur. Call your health care provider if you have any problems or questions after your procedure. WHAT TO EXPECT AFTER THE PROCEDURE  After your procedure: You may feel sleepy, clumsy, and have poor balance for several hours. Vomiting may occur if you eat too soon after the procedure. HOME CARE INSTRUCTIONS Do not participate in any activities where you could become injured for at least 24 hours. Do not: Drive. Swim. Ride a bicycle. Operate heavy machinery. Cook. Use power tools. Climb ladders. Work from a high place. Do not make important decisions or sign legal documents until you are improved. If you vomit, drink water, juice, or soup when you can drink without vomiting. Make sure you have little or no nausea before eating solid foods. Only take over-the-counter or prescription medicines for pain, discomfort, or fever as directed by your health care provider. Make sure you and your family  fully understand everything about the medicines given to you, including what side effects may occur. You should not drink alcohol, take sleeping pills, or take medicines that cause drowsiness for at least 24 hours. If you smoke, do not smoke without supervision. If you are feeling better, you may resume normal activities 24 hours after you were sedated. Keep all appointments with your health care provider. SEEK MEDICAL CARE IF: Your skin is pale or bluish in color. You continue to feel nauseous or vomit. Your pain is getting worse and is not helped by medicine. You have bleeding or swelling. You are still sleepy or feeling clumsy after 24 hours. SEEK IMMEDIATE MEDICAL CARE IF: You develop a rash. You have difficulty breathing. You develop any type of allergic problem. You have a fever. MAKE SURE YOU: Understand these instructions. Will watch your condition. Will get help right away if you are not doing well or get worse. Document Released: 08/10/2013 Document Reviewed: 08/10/2013 [facility] Patient Information 2015 [place], [place]. This information is not intended to replace advice given to you by your health care provider. Make sure you discuss any questions you have with your health care provider.

## 2015-04-16 NOTE — H&P (Signed)
Chief Complaint: "I am here for a biopsy."  Referring Physician(s): Thomas,Lisa K Dr. Burr Medico   History of Present Illness: Allison Mitchell is a 54 y.o. female with colorectal cancer stage III, right hydronephrosis s/p right ureteral stent . Recent CT imaging 03/2015 with suspicion for new peritoneal metastatic disease. The patient has been seen by Dr. Burr Medico on 04/14/15 and scheduled today for image guided liver lesion biopsy. She c/o lower back pain today. She denies any RUQ abdominal pain, N/V, lightheadedness or dizziness. She denies any chest pain, shortness of breath or palpitations. She denies any active signs of bleeding or excessive bruising. She denies any recent fever or chills. The patient denies any history of sleep apnea or chronic oxygen use. She has previously tolerated sedation without complications.    Past Medical History  Diagnosis Date  . GERD (gastroesophageal reflux disease)   . Depression   . Anxiety   . 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 Peripheral neuropathy due to chemotherapy   . Hydronephrosis, right     malignant--  s/p reimiplant right ureter  . History of pulmonary embolus (PE)     11-28-2014--  due to chemotherapy on coumadin  . Anticoagulated on Coumadin   . Colorectal cancer, stage III oncologist--  dr Stefani Dama    Dx  10-10-2014---Stage IIIC, [id number], Multifocal synchronous, Moderate differentiated Invasive, Mets to nodes (5 out of 17)/  s/p  Resection tumor cecum, terminal ileum, sigmoid/  currently chemoradiation therapy  . Dysuria   . S/P radiation therapy 03/02/15    completed pelvis/abd/50.4Gy    Past Surgical History  Procedure Laterality Date  . Cystoscopy with retrograde pyelogram, ureteroscopy and stent placement Right 10/05/2014    Procedure: Crest, URETEROSCOPY  AND STENT PLACEMENT;  Surgeon: Alexis Frock, MD;  Location: [facility];  Service: Urology;  Laterality: Right;  .  Laparotomy N/A 10/10/2014    Procedure: EXPLORATORY LAPAROTOMY;  Surgeon: Doreen Salvage, MD;  Location: [place];  Service: General;  Laterality: N/A;  . Partial colectomy  10/10/2014    Procedure: PARTIAL SIGMOID COLECTOMY;  Surgeon: Doreen Salvage, MD;  Location: [place];  Service: General;;  . Colostomy  10/10/2014    Procedure: COLOSTOMY;  Surgeon: Doreen Salvage, MD;  Location: [place];  Service: General;;  . Colostomy revision  10/10/2014    Procedure: PARTIAL RIGHT COLECTOMY;  Surgeon: Doreen Salvage, MD;  Location: [place];  Service: General;;  . Ureteral reimplantion Right 10/10/2014    Procedure: OPEN RIGHT URETERAL LYSIS ;  Surgeon: Alexis Frock, MD;  Location: [place];  Service: Urology;  Laterality: Right;  . Portacath placement N/A 10/17/2014    Procedure: INSERTION PORT-A-CATH LEFT SUBCLAVIAN AND MIDLINE INCISION STAPLE REMOVAL ;  Surgeon: Coralie Keens, MD;  Location: [place];  Service: General;  Laterality: N/A;  . Cystoscopy w/ retrogrades Right 01/10/2015    Procedure: CYSTOSCOPY WITH RETROGRADE PYELOGRAM;  Surgeon: Alexis Frock, MD;  Location: [facility];  Service: Urology;  Laterality: Right;  . Cystoscopy w/ ureteral stent placement Right 01/10/2015    Procedure: CYSTOSCOPY WITH STENT REPLACEMENT;  Surgeon: Alexis Frock, MD;  Location: [facility];  Service: Urology;  Laterality: Right;    Allergies: Amoxicillin  Medications: Prior to Admission medications   Medication Sig Start Date End Date Taking? Authorizing Provider  citalopram (CELEXA) 20 MG tablet Take 20-40 mg by mouth every morning.  09/18/14  Yes Historical Provider, MD  escitalopram (LEXAPRO) 10 MG tablet Take 10 mg by mouth daily.   Yes Historical Provider, MD  lidocaine-prilocaine (EMLA) cream Apply 1 application topically as needed. 11/16/14  Yes Truitt Merle, MD  Meth-Hyo-M Bl-Na Phos-Ph Sal (URIBEL) 118 MG CAPS Take 1 capsule by mouth every 8 (eight) hours as needed (pain). Every 8 hours as needed 12/14/14   Yes Historical Provider, MD  omeprazole (PRILOSEC) 40 MG capsule Take 40 mg by mouth daily as needed (heart burn.).  09/18/14  Yes Historical Provider, MD  zolpidem (AMBIEN) 5 MG tablet Take 1 tablet (5 mg total) by mouth at bedtime as needed for sleep. 03/20/15  Yes Truitt Merle, MD  enoxaparin (LOVENOX) 150 MG/ML injection Inject 140 mg into the skin daily.    Historical Provider, MD  magnesium hydroxide (MILK OF MAGNESIA) 400 MG/5ML suspension Take 5 mLs by mouth daily as needed for mild constipation.    Historical Provider, MD  ondansetron (ZOFRAN) 8 MG tablet Take 1 tablet (8 mg total) by mouth every 8 (eight) hours as needed for nausea or vomiting. 01/04/15   Truitt Merle, MD  PREVIDENT 5000 BOOSTER PLUS 1.1 % PSTE 1 each by Other route daily.  03/20/15   Historical Provider, MD  traMADol (ULTRAM) 50 MG tablet TAKE ONE TABLET EVERY 6 HOURS AS NEEDED FOR PAIN Patient not taking: Reported on 04/13/2015 02/14/15   Truitt Merle, MD  warfarin (COUMADIN) 5 MG tablet Take as directed by coumadin clinic Patient taking differently: Take 5 mg by mouth. Take one tablet (5mg ) on MWF and half a tablet (2.5mg ) on all other days. 03/20/15   Truitt Merle, MD     Family History  Problem Relation Age of Onset  . Cancer Mother     skin cancer   . Diabetes Father   . CAD Father     Onset in his 20s, has 7 stents  . Cancer Maternal Grandmother 78    colon cancer   . Cancer Paternal Grandmother 16    breast cancer     History   Social History  . Marital Status: Married    Spouse Name: N/A  . Number of Children: N/A  . Years of Education: N/A   Social History Main Topics  . Smoking status: Never Smoker   . Smokeless tobacco: Never Used  . Alcohol Use: No  . Drug Use: No  . Sexual Activity: No   Other Topics Concern  . 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  All other systems are negative.  Review of Systems  Vital Signs: BP 165/89 mmHg  Pulse 86  Temp(Src) 98.9 F (37.2 C) (Oral)  Resp 20  SpO2 100%  Physical Exam  Constitutional: She is oriented to person, place, and time. No distress.  HENT:  Head: Normocephalic and atraumatic.  Neck: No tracheal deviation present.  Cardiovascular: Normal rate and regular rhythm.  Exam reveals no gallop and no friction rub.   No murmur heard. Pulmonary/Chest: Effort normal and breath sounds normal. No respiratory distress. She has no wheezes. She has no rales.  Abdominal: Soft. Bowel sounds are normal. She exhibits no distension. There is no tenderness.  Neurological: She is alert and oriented to person, place, and time.  Skin: She is not diaphoretic.  Left chest port intact well healed incision     Mallampati Score:  MD Evaluation Airway: WNL Heart: WNL Abdomen: WNL Chest/ Lungs: WNL ASA  Classification: 3 Mallampati/Airway Score: Two  Imaging: Ct Chest W Contrast  03/27/2015   CLINICAL DATA:  Stage III colorectal cancer, resection and colostomy. Right ureteral stent placed. Radiation therapy completed. Ongoing chemotherapy. Pulmonary embolus on 11/28/2013  EXAM: CT CHEST, ABDOMEN, AND PELVIS WITH CONTRAST  TECHNIQUE: Multidetector CT imaging of the chest, abdomen and pelvis was performed following the standard protocol during bolus administration of intravenous contrast.  CONTRAST:  155mL OMNIPAQUE IOHEXOL 300 MG/ML  SOLN  COMPARISON:  Multiple exams, including 11/28/2014 and 11/14/2014  FINDINGS: CT CHEST FINDINGS  Mediastinum/Nodes: Minimal atherosclerotic calcification of the aortic arch.  Contrast medium is present in the esophagus, probably reflecting gastroesophageal reflux.  No pathologic thoracic adenopathy. No large clot in the pulmonary arterial tree, although today' s exam was not protocol does a pulmonary embolus exam and  accordingly is not sensitive for smaller amounts of clot.  Lungs/Pleura: Unremarkable  Musculoskeletal: Unremarkable  CT ABDOMEN PELVIS FINDINGS  Hepatobiliary: Stable peripheral 4 mm hypodensity in segment 7 of the liver, image 50 series 2. A second 4 mm hypodense lesion is present posteriorly in segment 7 on that same image.  There is a new subcapsular or capsular nodular density along the posterolateral margin of segment 6 measuring 2.1 by 1.1 cm on image 61 of series 2.  Stable 4 mm hypodense lesion in segment 4A of the liver, image 53 series 2.  Pancreas: Unremarkable  Spleen: Unremarkable  Adrenals/Urinary Tract: Right hydronephrosis and hydroureter noted. Right double-J ureteral stent in place. There is symmetric enhancement between the right and left kidney. Periureteral stranding is noted on the right, especially proximally. Fifth I cannot exclude ureteral wall thickening on the right.  Stomach/Bowel: Small type 1 hiatal hernia. Prominence of stool in the colon extending to the colostomy site. Postoperative findings along the cecum. Mild wall thickening throughout the rectal pouch.  Vascular/Lymphatic: Aortoiliac atherosclerotic vascular disease. Small peripancreatic lymph nodes are not pathologically enlarged by size criteria.  Reproductive: Serpentine fluid density along the right adnexa possibly from hydrosalpinx.  Other: New 0.6 by 0.9 cm right omental nodule, image 80 series 2.  Musculoskeletal: Segment and right transverse process at L2. Segmental S1 vertebra. Grade 1 anterolisthesis at L5-S1 without impingement or pars defects.  IMPRESSION: 1. Suspicion for new peritoneal metastatic disease including a 2.1 by 1.1 cm capsular nodule along the posterior margin of hepatic segment 6, and a new small right anterior omental nodule. 2. Several additional hypodense tiny lesions in the liver are stable. 3. Moderate right hydronephrosis and hydroureter with periureteral stranding, despite the presence of the  double-J ureteral stent. However, there is no asymmetry in enhancement or excretion of the kidneys. 4. Prominence of stool in the colon extending to the colostomy site. 5. 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   Electronically Signed   By: Van Clines M.D.   On: 03/27/2015 09:34   Ct Abdomen Pelvis W Contrast  03/27/2015   CLINICAL DATA:  Stage III colorectal cancer, resection and colostomy. Right ureteral stent placed. Radiation therapy completed. Ongoing chemotherapy. Pulmonary embolus on 11/28/2013  EXAM: CT CHEST, ABDOMEN, AND PELVIS WITH CONTRAST  TECHNIQUE: Multidetector CT imaging of the chest, abdomen and pelvis was performed  following the standard protocol during bolus administration of intravenous contrast.  CONTRAST:  145mL OMNIPAQUE IOHEXOL 300 MG/ML  SOLN  COMPARISON:  Multiple exams, including 11/28/2014 and 11/14/2014  FINDINGS: CT CHEST FINDINGS  Mediastinum/Nodes: Minimal atherosclerotic calcification of the aortic arch.  Contrast medium is present in the esophagus, probably reflecting gastroesophageal reflux.  No pathologic thoracic adenopathy. No large clot in the pulmonary arterial tree, although today' s exam was not protocol does a pulmonary embolus exam and accordingly is not sensitive for smaller amounts of clot.  Lungs/Pleura: Unremarkable  Musculoskeletal: Unremarkable  CT ABDOMEN PELVIS FINDINGS  Hepatobiliary: Stable peripheral 4 mm hypodensity in segment 7 of the liver, image 50 series 2. A second 4 mm hypodense lesion is present posteriorly in segment 7 on that same image.  There is a new subcapsular or capsular nodular density along the posterolateral margin of segment 6 measuring 2.1 by 1.1 cm on image 61 of series 2.  Stable 4 mm hypodense lesion in segment 4A of the liver, image 53 series  2.  Pancreas: Unremarkable  Spleen: Unremarkable  Adrenals/Urinary Tract: Right hydronephrosis and hydroureter noted. Right double-J ureteral stent in place. There is symmetric enhancement between the right and left kidney. Periureteral stranding is noted on the right, especially proximally. Fifth I cannot exclude ureteral wall thickening on the right.  Stomach/Bowel: Small type 1 hiatal hernia. Prominence of stool in the colon extending to the colostomy site. Postoperative findings along the cecum. Mild wall thickening throughout the rectal pouch.  Vascular/Lymphatic: Aortoiliac atherosclerotic vascular disease. Small peripancreatic lymph nodes are not pathologically enlarged by size criteria.  Reproductive: Serpentine fluid density along the right adnexa possibly from hydrosalpinx.  Other: New 0.6 by 0.9 cm right omental nodule, image 80 series 2.  Musculoskeletal: Segment and right transverse process at L2. Segmental S1 vertebra. Grade 1 anterolisthesis at L5-S1 without impingement or pars defects.  IMPRESSION: 1. Suspicion for new peritoneal metastatic disease including a 2.1 by 1.1 cm capsular nodule along the posterior margin of hepatic segment 6, and a new small right anterior omental nodule. 2. Several additional hypodense tiny lesions in the liver are stable. 3. Moderate right hydronephrosis and hydroureter with periureteral stranding, despite the presence of the double-J ureteral stent. However, there is no asymmetry in enhancement or excretion of the kidneys. 4. Prominence of stool in the colon extending to the colostomy site. 5. 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   Electronically Signed   By: Van Clines M.D.   On: 03/27/2015 09:34    Labs:  CBC:  Recent Labs  03/20/15 0833 03/28/15 1122 04/10/15 1315  04/12/15 0755  WBC 3.3* 3.6* 3.0* 2.9*  HGB 9.2* 8.8* 8.4* 8.3*  HCT 29.0* 27.6* 27.3* 26.2*  PLT 237 230 173 Occ Large platelets present 183    COAGS:  Recent Labs  11/28/14 1522  03/28/15 1122 04/10/15 1316 04/12/15 04/12/15 0755  INR 1.06  < > 2.20 2.80 3.8 3.80*  APTT 31  --   --   --   --   --   < > = values in this interval not displayed.  BMP:  Recent Labs  10/12/14 0430 10/13/14 0915 10/14/14 0436  11/29/14 0645  01/10/15 0917  03/20/15 0835 03/28/15 1122 04/10/15 1317 04/12/15 0757  NA 134* 140 144  < > 136  < > 139  < >  145 142 142 145  K 5.0 3.9 3.6*  < > 3.9  < > 4.0  < > 3.7 3.8 3.4* 3.7  CL 100 104 109  --  104  --  106  --   --   --   --   --   CO2 21 25 19   < > 25  < >  --   < > 22 20* 25 26  GLUCOSE 133* 109* 88  < > 107*  < > 96  < > 92 102 117 111  BUN 16 9 8   < > 16  < > 17  < > 10.7 14.4 16.6 12.8  CALCIUM 8.4 8.1* 8.3*  < > 8.4  < >  --   < > 8.8 8.2* 8.5 8.3*  CREATININE 0.87 0.61 0.60  < > 0.84  < > 0.70  < > 0.9 0.8 0.9 0.9  GFRNONAA 75* >90 >90  --  78*  --   --   --   --   --   --   --   GFRAA 87* >90 >90  --  >90  --   --   --   --   --   --   --   < > = values in this interval not displayed.  LIVER FUNCTION TESTS:  Recent Labs  03/20/15 0835 03/28/15 1122 04/10/15 1317 04/12/15 0757  BILITOT 0.40 0.29 0.34 0.38  AST 22 21 24 24   ALT 16 14 18 18   ALKPHOS 114 110 115 112  PROT 6.2* 6.3* 6.2* 6.1*  ALBUMIN 3.4* 3.5 3.3* 3.2*    TUMOR MARKERS:  Recent Labs  03/20/15 0835 03/28/15 1123 04/10/15 1317 04/12/15 0756  CEA 0.6 1.2 1.5 1.7    Assessment and Plan: Colorectal cancer stage III Right hydronephrosis s/p right ureteral stent  CT imaging 03/2015 with suspicion for new peritoneal metastatic disease Seen by Dr. Burr Medico 04/14/15 Scheduled today for image guided liver lesion biopsy with sedation The patient has been NPO, no blood thinners taken, labs and vitals have been reviewed. History of PE 11/2014 on Coumadin, LD  6/8 has been on Lovenox daily-held evening dose Risks and Benefits discussed with the patient including, but not limited to bleeding, infection, damage to adjacent structures or low yield requiring additional tests. All of the patient's questions were answered, patient is agreeable to proceed. Consent signed and in chart.   Thank you for this interesting consult.  I greatly enjoyed meeting Winna Golla and look forward to participating in their care.  SignedHedy Jacob 04/16/2015, 11:54 AM   I spent a total of 30 minutes in face to face in clinical consultation, greater than 50% of which was counseling/coordinating care for liver lesion.

## 2015-04-16 NOTE — Telephone Encounter (Signed)
per Marolyn Hammock in [place] to sch CC

## 2015-04-20 ENCOUNTER — Ambulatory Visit (HOSPITAL_BASED_OUTPATIENT_CLINIC_OR_DEPARTMENT_OTHER): Payer: BLUE CROSS/BLUE SHIELD | Admitting: Pharmacist

## 2015-04-20 ENCOUNTER — Telehealth: Payer: Self-pay | Admitting: Hematology

## 2015-04-20 ENCOUNTER — Other Ambulatory Visit: Payer: BLUE CROSS/BLUE SHIELD

## 2015-04-20 DIAGNOSIS — I2699 Other pulmonary embolism without acute cor pulmonale: Secondary | ICD-10-CM

## 2015-04-20 LAB — POCT INR: INR: 1.7

## 2015-04-20 LAB — PROTIME-INR
INR: 1.7 — AB (ref 2.00–3.50)
Protime: 20.4 Seconds — ABNORMAL HIGH (ref 10.6–13.4)

## 2015-04-20 NOTE — Patient Instructions (Addendum)
Take 2.5mg  today. On 04/21/15, slightly decrease coumadin to 2.5mg  daily except 5mg  on MWF (this was your dose prior to biopsy).  Continue Lovenox 140mg  daily until 04/24/15.  Recheck INR when here for lab/MD/treatment on 04/26/15; Lab at 8am, MD at 8:30am, Treatment at 9:45am, and coumadin clinic at 10am.

## 2015-04-20 NOTE — Progress Notes (Addendum)
INR slightly below goal after resuming coumadin likely on 04/17/15 (per patient report, not on 04/16/15). Pt took Coumadin 5mg  on Tu&Thu, 2.5mg  on Wed. Lovenox resumed on 04/17/15. Pt had liver biopsy on 04/16/15. No changes in diet or medications. Biopsy site is normal. No unusual bruising. No bleeding noted. No s/s of clotting noted. Take coumadin 2.5mg  today.  On 04/21/15, slightly decrease coumadin to 2.5mg  daily except 5mg  on MWF (this was her dose prior to biopsy).  Continue Lovenox 140mg  daily until 04/24/15.  Recheck INR when here for lab/MD/treatment on 04/26/15; Lab at 8am, MD at 8:30am, treatment at 9:45am, and coumadin clinic at 10am.

## 2015-04-20 NOTE — Telephone Encounter (Signed)
per Theadora Rama to sch CC

## 2015-04-23 NOTE — Telephone Encounter (Signed)
error 

## 2015-04-24 ENCOUNTER — Ambulatory Visit: Payer: BLUE CROSS/BLUE SHIELD | Admitting: Hematology

## 2015-04-24 ENCOUNTER — Other Ambulatory Visit: Payer: BLUE CROSS/BLUE SHIELD

## 2015-04-25 ENCOUNTER — Ambulatory Visit: Payer: BLUE CROSS/BLUE SHIELD

## 2015-04-26 ENCOUNTER — Telehealth: Payer: Self-pay | Admitting: Hematology

## 2015-04-26 ENCOUNTER — Encounter: Payer: Self-pay | Admitting: Hematology

## 2015-04-26 ENCOUNTER — Ambulatory Visit (HOSPITAL_BASED_OUTPATIENT_CLINIC_OR_DEPARTMENT_OTHER): Payer: Self-pay | Admitting: Pharmacist

## 2015-04-26 ENCOUNTER — Ambulatory Visit: Payer: BLUE CROSS/BLUE SHIELD

## 2015-04-26 ENCOUNTER — Other Ambulatory Visit (HOSPITAL_BASED_OUTPATIENT_CLINIC_OR_DEPARTMENT_OTHER): Payer: BLUE CROSS/BLUE SHIELD

## 2015-04-26 ENCOUNTER — Ambulatory Visit (HOSPITAL_BASED_OUTPATIENT_CLINIC_OR_DEPARTMENT_OTHER): Payer: BLUE CROSS/BLUE SHIELD | Admitting: Hematology

## 2015-04-26 ENCOUNTER — Ambulatory Visit (HOSPITAL_BASED_OUTPATIENT_CLINIC_OR_DEPARTMENT_OTHER): Payer: BLUE CROSS/BLUE SHIELD

## 2015-04-26 ENCOUNTER — Telehealth: Payer: Self-pay | Admitting: *Deleted

## 2015-04-26 VITALS — BP 155/73 | HR 80 | Temp 97.6°F | Resp 18 | Ht 68.0 in | Wt 210.1 lb

## 2015-04-26 DIAGNOSIS — C182 Malignant neoplasm of ascending colon: Secondary | ICD-10-CM

## 2015-04-26 DIAGNOSIS — Z5111 Encounter for antineoplastic chemotherapy: Secondary | ICD-10-CM | POA: Diagnosis not present

## 2015-04-26 DIAGNOSIS — C180 Malignant neoplasm of cecum: Secondary | ICD-10-CM

## 2015-04-26 DIAGNOSIS — C19 Malignant neoplasm of rectosigmoid junction: Secondary | ICD-10-CM

## 2015-04-26 DIAGNOSIS — G622 Polyneuropathy due to other toxic agents: Secondary | ICD-10-CM

## 2015-04-26 DIAGNOSIS — D509 Iron deficiency anemia, unspecified: Secondary | ICD-10-CM | POA: Diagnosis not present

## 2015-04-26 DIAGNOSIS — C187 Malignant neoplasm of sigmoid colon: Secondary | ICD-10-CM

## 2015-04-26 DIAGNOSIS — I2699 Other pulmonary embolism without acute cor pulmonale: Secondary | ICD-10-CM

## 2015-04-26 DIAGNOSIS — C189 Malignant neoplasm of colon, unspecified: Secondary | ICD-10-CM

## 2015-04-26 DIAGNOSIS — Z95828 Presence of other vascular implants and grafts: Secondary | ICD-10-CM

## 2015-04-26 DIAGNOSIS — Z86711 Personal history of pulmonary embolism: Secondary | ICD-10-CM

## 2015-04-26 LAB — COMPREHENSIVE METABOLIC PANEL (CC13)
ALBUMIN: 3.3 g/dL — AB (ref 3.5–5.0)
ALT: 25 U/L (ref 0–55)
AST: 30 U/L (ref 5–34)
Alkaline Phosphatase: 179 U/L — ABNORMAL HIGH (ref 40–150)
Anion Gap: 8 mEq/L (ref 3–11)
BUN: 11 mg/dL (ref 7.0–26.0)
CO2: 27 mEq/L (ref 22–29)
Calcium: 8.5 mg/dL (ref 8.4–10.4)
Chloride: 107 mEq/L (ref 98–109)
Creatinine: 1 mg/dL (ref 0.6–1.1)
EGFR: 62 mL/min/{1.73_m2} — AB (ref >90)
GLUCOSE: 139 mg/dL (ref 70–140)
POTASSIUM: 3.4 meq/L — AB (ref 3.5–5.1)
Sodium: 143 mEq/L (ref 136–145)
TOTAL PROTEIN: 5.9 g/dL — AB (ref 6.4–8.3)
Total Bilirubin: 0.35 mg/dL (ref 0.20–1.20)

## 2015-04-26 LAB — TECHNOLOGIST REVIEW

## 2015-04-26 LAB — CBC WITH DIFFERENTIAL/PLATELET
BASO%: 0.3 % (ref 0.0–2.0)
Basophils Absolute: 0 10*3/uL (ref 0.0–0.1)
EOS%: 0.5 % (ref 0.0–7.0)
Eosinophils Absolute: 0.1 10*3/uL (ref 0.0–0.5)
HEMATOCRIT: 26.9 % — AB (ref 34.8–46.6)
HGB: 8.2 g/dL — ABNORMAL LOW (ref 11.6–15.9)
LYMPH%: 8.2 % — AB (ref 14.0–49.7)
MCH: 25.6 pg (ref 25.1–34.0)
MCHC: 30.5 g/dL — ABNORMAL LOW (ref 31.5–36.0)
MCV: 84.1 fL (ref 79.5–101.0)
MONO#: 0.6 10*3/uL (ref 0.1–0.9)
MONO%: 6.7 % (ref 0.0–14.0)
NEUT#: 7.8 10*3/uL — ABNORMAL HIGH (ref 1.5–6.5)
NEUT%: 84.3 % — AB (ref 38.4–76.8)
PLATELETS: 123 10*3/uL — AB (ref 145–400)
RBC: 3.2 10*6/uL — ABNORMAL LOW (ref 3.70–5.45)
RDW: 19.8 % — ABNORMAL HIGH (ref 11.2–14.5)
WBC: 9.2 10*3/uL (ref 3.9–10.3)
lymph#: 0.8 10*3/uL — ABNORMAL LOW (ref 0.9–3.3)
nRBC: 1 % — ABNORMAL HIGH (ref 0–0)

## 2015-04-26 LAB — PROTIME-INR
INR: 3.5 (ref 2.00–3.50)
PROTIME: 42 s — AB (ref 10.6–13.4)

## 2015-04-26 LAB — POCT INR: INR: 3.5

## 2015-04-26 MED ORDER — LEUCOVORIN CALCIUM INJECTION 350 MG
400.0000 mg/m2 | Freq: Once | INTRAVENOUS | Status: AC
  Administered 2015-04-26: 860 mg via INTRAVENOUS
  Filled 2015-04-26: qty 43

## 2015-04-26 MED ORDER — DEXTROSE 5 % IV SOLN
Freq: Once | INTRAVENOUS | Status: AC
  Administered 2015-04-26: 10:00:00 via INTRAVENOUS

## 2015-04-26 MED ORDER — SODIUM CHLORIDE 0.9 % IV SOLN
2400.0000 mg/m2 | INTRAVENOUS | Status: AC
  Administered 2015-04-26: 5150 mg via INTRAVENOUS
  Filled 2015-04-26: qty 103

## 2015-04-26 MED ORDER — SODIUM CHLORIDE 0.9 % IJ SOLN
10.0000 mL | INTRAMUSCULAR | Status: DC | PRN
  Filled 2015-04-26: qty 10

## 2015-04-26 MED ORDER — FLUOROURACIL CHEMO INJECTION 2.5 GM/50ML
400.0000 mg/m2 | Freq: Once | INTRAVENOUS | Status: AC
  Administered 2015-04-26: 850 mg via INTRAVENOUS
  Filled 2015-04-26: qty 17

## 2015-04-26 MED ORDER — SODIUM CHLORIDE 0.9 % IJ SOLN
10.0000 mL | INTRAMUSCULAR | Status: DC | PRN
  Administered 2015-04-26: 10 mL via INTRAVENOUS
  Filled 2015-04-26: qty 10

## 2015-04-26 MED ORDER — OXALIPLATIN CHEMO INJECTION 100 MG/20ML
85.0000 mg/m2 | Freq: Once | INTRAVENOUS | Status: AC
  Administered 2015-04-26: 185 mg via INTRAVENOUS
  Filled 2015-04-26: qty 37

## 2015-04-26 MED ORDER — HEPARIN SOD (PORK) LOCK FLUSH 100 UNIT/ML IV SOLN
500.0000 [IU] | Freq: Once | INTRAVENOUS | Status: AC | PRN
  Filled 2015-04-26: qty 5

## 2015-04-26 MED ORDER — POTASSIUM CHLORIDE CRYS ER 20 MEQ PO TBCR: 20.0000 meq | EXTENDED_RELEASE_TABLET | Freq: Every day | ORAL | Status: DC

## 2015-04-26 MED ORDER — SODIUM CHLORIDE 0.9 % IV SOLN
Freq: Once | INTRAVENOUS | Status: AC
  Administered 2015-04-26: 10:00:00 via INTRAVENOUS
  Filled 2015-04-26: qty 4

## 2015-04-26 NOTE — Patient Instructions (Signed)

## 2015-04-26 NOTE — Patient Instructions (Signed)
INR slightly above goal slightly decrease coumadin to 2.5mg  daily except 5mg  on Monday and Friday   Recheck INR when here for lab/MD/treatment on 05/10/15 and we will see you in the infusion room at 1:15pm

## 2015-04-26 NOTE — [facility] Oncology Navigator Note (Signed)
Oncology Nurse Navigator Documentation  Oncology Nurse Navigator Flowsheets 04/26/2015  Navigator Encounter Type Treatment  Patient Visit Type Medonc  Treatment Phase Treatment -FOLFOX #7/8  Barriers/Navigation Needs No barriers at this time  Support Groups/Services Feeling well, no side effects  Time Spent with Patient 15

## 2015-04-26 NOTE — Patient Instructions (Signed)
[facility] Discharge Instructions for Patients Receiving Chemotherapy  Today you received the following chemotherapy agents Oxaliplatin/Leucovorin/Fluorouricil.  To help prevent nausea and vomiting after your treatment, we encourage you to take your nausea medication as directed.   If you develop nausea and vomiting that is not controlled by your nausea medication, call the clinic.   BELOW ARE SYMPTOMS THAT SHOULD BE REPORTED IMMEDIATELY:  *FEVER GREATER THAN 100.5 F  *CHILLS WITH OR WITHOUT FEVER  NAUSEA AND VOMITING THAT IS NOT CONTROLLED WITH YOUR NAUSEA MEDICATION  *UNUSUAL SHORTNESS OF BREATH  *UNUSUAL BRUISING OR BLEEDING  TENDERNESS IN MOUTH AND THROAT WITH OR WITHOUT PRESENCE OF ULCERS  *URINARY PROBLEMS  *BOWEL PROBLEMS  UNUSUAL RASH Items with * indicate a potential emergency and should be followed up as soon as possible.  Feel free to call the clinic you have any questions or concerns. The clinic phone number is [phone].  Please show the CHEMO ALERT CARD at check-in to the Emergency Department and triage nurse.    

## 2015-04-26 NOTE — Telephone Encounter (Signed)
per Dr Burr Medico to sch w/o MD on 7/7 no one avaiable-ok tp sch lab/flush/inf

## 2015-04-26 NOTE — Telephone Encounter (Signed)
per pof to sch pt appt-sent MW email to sch pt trmt-gave pt contrast for scan-gave pt avs-stated will look on mY CHART as well

## 2015-04-26 NOTE — Progress Notes (Signed)
[name]  [phone] Fax:([phone]) [place] Note   Patient Care Team: [name], MD as PCP - General (Family Medicine)  CHIEF COMPLAINTS:  Follow-up colon cancer  Oncology History   Next week.Colorectal cancer, stage III   Staging form: Neuroendocrine Tumor - Colon/Rectum, AJCC 7th Edition     Clinical: Stage Unknown (T4, NX, M0) - Unsigned     Pathologic: No stage assigned - Unsigned       Colorectal cancer, stage III   10/04/2014 Tumor Marker CEA 1.0 .  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 mpT4aN2aM0,stage IIIC.    10/10/2014 Surgery partial right hemicolectomy with removal of terminal ileum, sigmoid colectomy, right ureter lysis, (+) residual surgical margin    11/23/2014 - 01/06/2015 Chemotherapy FOLFOX every 2 weeks for 4 cycles    01/23/2015 - 03/02/2015 Radiation Therapy adjuvant radiation   03/27/2015 Progression CT chest, abdomen and pelvis showed new capsular nodules along the posterior margin of hepatic segment 6, measuring 2.1X1.1cm, and then you omental nodule, suspicious for new metastasis.   03/29/2015 -  Chemotherapy Restarted FOLFOX every 2 weeks   OTHER ISSUES: 1. Pulmonary embolism in segment omental branches of the right lung, diagnosed on 11/28/2014 2. Right ureter stent placed on 10/20/2014  CURRENT THERAPY: FOLFOX every 2 weeks, started on 11/23/2014, restarted on 03/29/2015 after radiation   HISTORY OF PRESENTING ILLNESS:  Allison Mitchell 54 y.o. female is here for follow-up after her hospital discharge. She was recently diagnosed with 3 synchronous to colon cancer, status post surgical resection. I initially saw her when she was diagnosed in the hospital.  She presented with some GI symptoms since last October 2014 at which time she  was diagnosed with ischemic colitis. Due to guaiac-positive stools, patient had undergone an outpatient colonoscopy on 09/26/2014, which showed inflamed and ulcerated mucosa in the terminal ileum, and a 1.5 cm mass in the ascending colon and additional 1.5 cm mass in the mid sigmoid colon. All biopsy from the above 3 sites came back positive for invasive moderate differentiated adenocarcinoma, and the sigmoid mass containing squamous cell differentiation.   She was hospitalized for worsening of her symptoms on 10/03/2014.  A CT of the abdomen and pelvis with contrast on 10/02/2014 was remarkable for soft tissue/bowel wall thickening of the right lower pelvis center at the cecum-terminal ileum involving a portion of the adjacent sigmoid colon. This causes small bowel obstruction and obstructed the distal right ureter causing moderate to severe right hydroureteronephrosis. She was [facility] to [facility] for surgical management and further testing. CT of the chest with contrast on 10/03/2014 negative for metastatic disease. MRI of the liver without and without contrast on 10/03/2014 is remarkable for small hepatic cysts, but no findings suspicious for hepatic metastatic disease. Right-sided hydroureteronephrosis was once again seen, with early small bowel obstruction bowel gas pattern.  She underwent right hemicolectomy with primary anastomosis with sigmoid colectomy with end colostomy on Friday, Dec 5, after cystoscopy is performed on 12/4 to rule out GU invasion and a ureteral stent placement.  She has family history of colon cancer in her grandmother and polyps in her sister. Denies risk factors for HIV or hepatitis. Denies Diabetes. No Tobacco. No ETOH. Denies prior radiation. SHe did consume significant amount of red meat until recently.  We are asked to see the patient in consultation, with recommendations on the oncology standpoint  She is recovering slowly after the  surgery. She had the surgical suture opened about 3 weeks ago and was treated for antibiotics , and now much improved. She has no significant pain, but mild abdominal discomfort, no nausea, her stool is getting thicker, she clean her colostomy bag daily. She was seen by Dr. Hulen Skains this morning. She noticed some cloudy urine in the past few days, with mild burning sensation, no fever or back pain. Her appetite is getting better, eats well.  She lost about 25 lbs. No fever or chills. No other new complains.   INTERIM HISTORY: Allison Mitchell returns for follow-up and cycle 7 chemotherapy. She went to [facility] and saw Dr. Johney Maine for [facility]. She was very discouraged by the high risk of the procedure and poor outcome overall, and negative impression about Dr. Johney Maine, she is not sure if she was to go back for surgery. She overall feels well, she has mild intermittent right flank pain, mild to moderate fatigue, no other complaints.    MEDICAL HISTORY:  Past Medical History  Diagnosis Date  . GERD (gastroesophageal reflux disease)   . Depression   . Anxiety   . 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 Peripheral neuropathy due to chemotherapy   . Hydronephrosis, right     malignant--  s/p reimiplant right ureter  . History of pulmonary embolus (PE)     11-28-2014--  due to chemotherapy on coumadin  . Anticoagulated on Coumadin   . Colorectal cancer, stage III oncologist--  dr Stefani Dama    Dx  10-10-2014---Stage IIIC, mpT4aN2aM0, Multifocal synchronous, Moderate differentiated Invasive, Mets to nodes (5 out of 17)/  s/p  Resection tumor cecum, terminal ileum, sigmoid/  currently chemoradiation therapy  . Dysuria   . S/P radiation therapy 03/02/15    completed pelvis/abd/50.4Gy    SURGICAL HISTORY: Past Surgical History  Procedure Laterality Date  . Cystoscopy with retrograde pyelogram, ureteroscopy and stent placement Right 10/05/2014    Procedure: [place], URETEROSCOPY  AND STENT PLACEMENT;  Surgeon: Alexis Frock, MD;  Location: [facility];  Service: Urology;  Laterality: Right;  . Laparotomy N/A 10/10/2014    Procedure: EXPLORATORY LAPAROTOMY;  Surgeon: Doreen Salvage, MD;  Location: [place];  Service: General;  Laterality: N/A;  . Partial colectomy  10/10/2014    Procedure: PARTIAL SIGMOID COLECTOMY;  Surgeon: Doreen Salvage, MD;  Location: [place];  Service: General;;  . Colostomy  10/10/2014    Procedure: COLOSTOMY;  Surgeon: Doreen Salvage, MD;  Location: [place];  Service: General;;  . Colostomy revision  10/10/2014    Procedure: PARTIAL RIGHT COLECTOMY;  Surgeon: Doreen Salvage, MD;  Location: [place];  Service: General;;  . Ureteral reimplantion Right 10/10/2014    Procedure: OPEN RIGHT URETERAL LYSIS ;  Surgeon: Alexis Frock, MD;  Location: [place];  Service: Urology;  Laterality: Right;  . Portacath placement N/A 10/17/2014    Procedure: INSERTION PORT-A-CATH LEFT SUBCLAVIAN AND MIDLINE INCISION STAPLE REMOVAL ;  Surgeon: Coralie Keens, MD;  Location: [place];  Service: General;  Laterality: N/A;  . Cystoscopy w/ retrogrades Right 01/10/2015    Procedure: CYSTOSCOPY WITH RETROGRADE PYELOGRAM;  Surgeon: Alexis Frock, MD;  Location: [facility];  Service: Urology;  Laterality: Right;  . Cystoscopy w/ ureteral stent placement Right 01/10/2015    Procedure: CYSTOSCOPY WITH STENT REPLACEMENT;  Surgeon: Alexis Frock, MD;  Location: [facility];  Service: Urology;  Laterality: Right;    SOCIAL HISTORY: History   Social History  . Marital Status: Married    Spouse Name: N/A  . Number of Children: N/A  . Years of Education: N/A   Occupational History  . Not on file.   Social History Main Topics  . Smoking status: Never Smoker   . Smokeless tobacco: Never Used  . Alcohol Use: No  . Drug Use: No  . Sexual Activity: No   Other Topics Concern  . 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 Cancer Mother     skin cancer   . Diabetes Father   . CAD Father     Onset in his 58s, has 7 stents  . Cancer Maternal Grandmother 81    colon cancer   . Cancer Paternal Grandmother 33    breast cancer     ALLERGIES:  is allergic to amoxicillin.  MEDICATIONS:  Current Outpatient Prescriptions  Medication Sig Dispense Refill  . citalopram (CELEXA) 20 MG tablet Take 20-40 mg by mouth every morning.   1  . enoxaparin (LOVENOX) 150 MG/ML injection Inject 140 mg into the skin daily.    Marland Kitchen lidocaine-prilocaine (EMLA) cream Apply 1 application topically as needed. 30 g 0  . magnesium hydroxide (MILK OF MAGNESIA) 400 MG/5ML suspension Take 5 mLs by mouth daily as needed for mild constipation.    Marland Kitchen omeprazole (PRILOSEC) 40 MG capsule Take 40 mg by mouth daily as needed (heart burn.).   12  . ondansetron (ZOFRAN) 8 MG tablet Take 1 tablet (8 mg total) by mouth every 8 (eight) hours as needed for nausea or vomiting. 20 tablet 3  . oxyCODONE (OXY IR/ROXICODONE) 5 MG immediate release tablet Take 1 tablet (5 mg total) by mouth every 6 (six) hours as needed for severe pain. 30 tablet 0  . warfarin (COUMADIN) 5 MG tablet Take as directed by coumadin clinic (Patient taking differently: Take 5 mg by mouth. Take one tablet (35m) on MWF and half a tablet (2.55m on all other days.) 30 tablet 3  . zolpidem (AMBIEN) 5 MG tablet Take 1 tablet (5 mg total) by mouth at bedtime as needed for sleep. 30 tablet 0  . escitalopram (LEXAPRO) 10 MG tablet Take 10 mg by mouth daily.    . Meth-Hyo-M Bl-Na Phos-Ph Sal (URIBEL) 118 MG CAPS Take 1 capsule by mouth every 8 (eight) hours as needed (pain). Every 8 hours as needed  11  . PREVIDENT 5000 BOOSTER PLUS 1.1 % PSTE 1 each by Other route daily.   5   No current facility-administered medications for this visit.     REVIEW OF SYSTEMS:   Constitutional: Denies fevers, chills or abnormal night sweats Eyes: Denies blurriness of vision, double vision or watery eyes Ears, nose, mouth, throat, and face: Denies mucositis or sore throat Respiratory: Denies cough, dyspnea or wheezes Cardiovascular: Denies palpitation, chest discomfort or lower extremity swelling Gastrointestinal:  Denies nausea, heartburn or change in bowel habits Skin: Denies abnormal skin rashes Lymphatics: Denies new lymphadenopathy or easy bruising Neurological:Denies numbness, tingling or new weaknesses Behavioral/Psych: Mood is stable, no new changes  All other systems were reviewed with the patient and are  negative.  PHYSICAL EXAMINATION: ECOG PERFORMANCE STATUS: 1 - Symptomatic but completely ambulatory  Filed Vitals:   04/26/15 0834  BP: 155/73  Pulse: 80  Temp: 97.6 F (36.4 C)  Resp: 18   Filed Weights   04/26/15 0834  Weight: 210 lb 1.6 oz (95.301 kg)    GENERAL:alert, no distress and comfortable SKIN: skin color, texture, turgor are normal, no rashes or significant lesions EYES: normal, conjunctiva are pink and non-injected, sclera clear OROPHARYNX:no exudate, no erythema and lips, buccal mucosa, and tongue normal  NECK: supple, thyroid normal size, non-tender, without nodularity LYMPH:  no palpable lymphadenopathy in the cervical, axillary or inguinal LUNGS: clear to auscultation and percussion with normal breathing effort HEART: regular rate & rhythm and no murmurs and no lower extremity edema ABDOMEN:abdomen soft, non-tender and normal bowel sounds. Positive for colostomy bag on the left, with normal-appearing stool. Surgical wound upper part is well-healed. Musculoskeletal:no cyanosis of digits and no clubbing  PSYCH: alert & oriented x 3 with fluent speech NEURO: no focal motor/sensory deficits  LABORATORY DATA:  CBC Latest Ref Rng 04/26/2015 04/16/2015 04/12/2015  WBC 3.9 - 10.3 10e3/uL 9.2 6.5 2.9(L)    Hemoglobin 11.6 - 15.9 g/dL 8.2(L) 8.0(L) 8.3(L)  Hematocrit 34.8 - 46.6 % 26.9(L) 27.0(L) 26.2(L)  Platelets 145 - 400 10e3/uL 123(L) 133(L) 183    CMP Latest Ref Rng 04/26/2015 04/12/2015 04/10/2015  Glucose 70 - 140 mg/dl 139 111 117  BUN 7.0 - 26.0 mg/dL 11.0 12.8 16.6  Creatinine 0.6 - 1.1 mg/dL 1.0 0.9 0.9  Sodium 136 - 145 mEq/L 143 145 142  Potassium 3.5 - 5.1 mEq/L 3.4(L) 3.7 3.4(L)  Chloride 96 - 112 mmol/L - - -  CO2 22 - 29 mEq/L _0 Calcium 8.4 - 10.4 mg/dL 8.5 8.3(L) 8.5  Total Protein 6.4 - 8.3 g/dL 5.9(L) 6.1(L) 6.2(L)  Total Bilirubin 0.20 - 1.20 mg/dL 0.35 0.38 0.34  Alkaline Phos 40 - 150 U/L 179(H) 112 115  AST 5 - 34 U/L _1 ALT 0 - 55 U/L _2 Surgical path 10/10/2014 1. Colon, segmental resection for tumor, Cecum, terminal ileum, sigmoid colon - MULTIFOCAL INVASIVE ADENOCARCINOMA, MODERATELY DIFFERENTIATED, THE LARGEST FOCUS SPANS 4.0 CM. - ADENOCARCINOMA INVOLVES SEROSA. - LYMPHOVASCULAR INVASION IS IDENTIFIED. - METASTATIC CARCINOMA IN 5 OF 17 LYMPH NODES (5/17). - THE PROXIMAL AND DISTAL SURGICAL RESECTION MARGINS ARE NEGATIVE FOR ADENOCARCINOMA. - SEE ONCOLOGY TABLE BELOW. 2. Soft tissue, biopsy, Right periureteral tissue - ADENOCARCINOMA. 3. Soft tissue, biopsy, Right periureteral tissue - INFLAMED AND DEGENERATING FIBROADIPOSE TISSUE. - THERE IS NO EVIDENCE OF MALIGNANCY. Microscopic Comment 1. COLON AND RECTUM (INCLUDING TRANS-ANAL RESECTION): Specimen: Distal ileum, cecum, proximal ascending colon, and adherent sigmoid colon. Procedure: Resection. Tumor site: Multiple foci, including ileocecal valve, ascending colon and sigmoid. Specimen integrity: Disrupted. Macroscopic intactness of mesorectum: N/A Macroscopic tumor perforation: Present. Invasive tumor: Maximum size: 4.0 cm. Histologic type(s): Adenocarcinoma. Histologic grade and differentiation: G2: moderately differentiated Type of polyp in which invasive carcinoma  arose: Tubulovillous adenomas. Microscopic extension of invasive tumor: Focally involves the serosa. Lymph-Vascular invasion: Present. Peri-neural invasion: Not identified. Tumor deposit(s) (discontinuous extramural extension): Not identified. Resection margins: Proximal margin: 4.0 cm Distal margin: 3.5 cm Circumferential (radial) (posterior ascending, posterior descending; lateral and posterior mid-rectum; and entire lower 1/3 rectum): Cannot be assessed. Mesenteric margin (sigmoid and transverse): Cannot be assessed. Treatment effect (neo-adjuvant therapy): N/A Additional polyp(s): Not identified. Non-neoplastic findings: No significant findings. Lymph nodes:  number examined 17; number positive: 5 Pathologic Staging: mpT4a, pN2a, pMX Ancillary studies: A block will be sent for MSI testing by [facility] and MSI testing by PCR and the results reported separately. Comment: There appear to be three synchronous primary tumors in the specimen, one at the ileocecal valve, one in the ascending colon, and one at the sigmoid colon. Each of these foci contain an associated tubulovillous adenoma, suggesting three separate primary sites. The tumor in the sigmoid colon appears to involve the serosa as well. The other two tumors show extension into at least the pericolonic soft tissue. Dr Mali Rund has reviewed selected slides and concurs that there are likely three synchronous tumors here. The case was discussed with Dr Hulen Skains. Per Dr Hulen Skains, the specimen in #2 is a contiguous piece of tissue 2 of 4 Amended copy Amended FINAL for NAIOMI, MUSTO [id number]) Microscopic Comment(continued) with the main tumor, and therefore, not considered a distant metastatic focus. Therefore, the tumor is staged as an MX. (JBK:ecj 10/16/2014) JOSHUA  Mismatch Repair (MMR) Protein Immunohistochemistry (IHC) IHC Expression Result: MLH1: Preserved nuclear expression (greater 50% tumor expression) MSH2: Preserved  nuclear expression (greater 50% tumor expression) MSH6: Preserved nuclear expression (greater 50% tumor expression) PMS2: Preserved nuclear expression (greater 50% tumor expression) * Internal control demonstrates intact nuclear expression Interpretation: NORMAL There is preserved expression  RADIOGRAPHIC STUDIES: I have personally reviewed the radiological images as listed and agreed with the findings in the report.  CT of chest, abdomen and pelvis 03/27/2015  IMPRESSION: 1. Suspicion for new peritoneal metastatic disease including a 2.1 by 1.1 cm capsular nodule along the posterior margin of hepatic segment 6, and a new small right anterior omental nodule. 2. Several additional hypodense tiny lesions in the liver are stable. 3. Moderate right hydronephrosis and hydroureter with periureteral stranding, despite the presence of the double-J ureteral stent. However, there is no asymmetry in enhancement or excretion of the kidneys. 4. Prominence of stool in the colon extending to the colostomy site. 5. 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  ASSESSMENT & PLAN:  54 year old Caucasian female with minimal past medical history, who was found to have 3 multifocal colon cancer at terminal ileum/cecum, ascending colon and sigmoid colon. All of them has adenocarcinoma,  And the cecum tumor has squamous differentiation. The cecum tumor has directly invaded the soft tissue along the right ureter, which was not completely resected (1% tumor left over, per Dr. Hulen Skains). She has mpT4aN2aM0 stage IIIB, MSI/MMR normal.   1. Multifocal stage IIIB colon cancer, MMR normal, MSI stable, biopsy confirmed liver and peritoneal metastases now  -I discussed her surgical past findings extensively with patient and her husband.  Unfortunately, the tumor was not completely resected. She has very small amount of tumor left over. -Given her residual tumor after surgery, advanced stage III disease, high risk recurrence in the future,  we recommended sequential adjuvant chemotherapy and irradiation -Her restaging scan after radiation unfortunately showed  probable peritoneal metastasis in liver and omentum. Liver biopsy confirmed metastasis.  -She was referred and seen by Dr. Johney Maine to consider surgical debulking and HIPEC. Per their recommendation, I'll obtain a liver MRI and continue chemotherapy for 2 more weeks, then restaging and further surgery. -Patient was very discouraged by the high risk of the surgery and elective impression from the consultation, not sure if she would like  to pursue surgery. -Lab reviewed, adequate for treatment, proceed cycle 7 FOLFOX today -We discussed to add on Avastin if she decides not to pursue surgery -I have requested her tumor to be tested for care arrest and at rest to see if she is a candidate for EGFR inhibitor.  2. PE -She was on Lovenox injection for a month then switched to oral anticoagulant Coumadin.  -Follow-up with Coumadin clinic.   3. Right pelvic cyst on CT 11/14/2014 -This has resolved on the recent CT scan.  3. right ureter stent placement -She will follow up with her urologist.  -her urinary symptoms likely related to the stent, previous multiple culture were negative. - okay to take Tylenol for pain.  5. 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 Her iron study is consistent with iron deficient anemia -This is secondary to the bleeding from her colon cancer. -She received IV Feraheme 540m twice.   6.G1 peripheral neuropathy, fatigue  -Secondary to chemotherapy, improved  -Continue close monitoring  Plan, -Cycle 7 FOLFOX today -Abdominal MRI with and without contrast as soon as possible. -Return to clinic in 2 weeks for next cycle  chemotherapy   All questions were answered. The patient knows to call the clinic with any problems, questions or concerns.  I spent 20 minutes counseling the patient face to face. The total time spent in the appointment was 25 minutes and more than 50% was on counseling.     FTruitt Merle MD 04/26/2015 9:03 AM

## 2015-04-26 NOTE — Telephone Encounter (Signed)
per Chris to sch CC-pt aware °

## 2015-04-26 NOTE — Telephone Encounter (Signed)
Per staff message and POF I have scheduled appts. Advised scheduler of appts. JMW  

## 2015-04-26 NOTE — Progress Notes (Signed)
INR slightly above goal today at 3.5 (goal 2-3) Pt is doing well today with no complaints Pt seen in infusion area No missed or extra doses No diet or medication changes Pt has some bruising on arms from working in her yard.  Pt previously therapeutic on this dose. Will slightly decrease dose this week. May need more of a dose adjustment next visit if INR still elevated Plans: slightly decrease coumadin to 2.5mg  daily except 5mg  on Monday and Friday   Recheck INR when here for lab/MD/treatment on 05/10/15 and we will see you in the infusion room at 1:15pm

## 2015-04-27 ENCOUNTER — Encounter: Payer: Self-pay | Admitting: Hematology

## 2015-04-27 ENCOUNTER — Encounter (HOSPITAL_COMMUNITY): Payer: Self-pay

## 2015-04-27 LAB — CEA: CEA: 1.8 ng/mL (ref 0.0–5.0)

## 2015-04-28 ENCOUNTER — Ambulatory Visit (HOSPITAL_BASED_OUTPATIENT_CLINIC_OR_DEPARTMENT_OTHER): Payer: BLUE CROSS/BLUE SHIELD

## 2015-04-28 VITALS — BP 152/79 | HR 81 | Temp 97.3°F | Resp 18

## 2015-04-28 DIAGNOSIS — D500 Iron deficiency anemia secondary to blood loss (chronic): Secondary | ICD-10-CM

## 2015-04-28 DIAGNOSIS — C19 Malignant neoplasm of rectosigmoid junction: Secondary | ICD-10-CM

## 2015-04-28 DIAGNOSIS — Z452 Encounter for adjustment and management of vascular access device: Secondary | ICD-10-CM | POA: Diagnosis not present

## 2015-04-28 MED ORDER — SODIUM CHLORIDE 0.9 % IV SOLN: Freq: Once | INTRAVENOUS | Status: DC

## 2015-04-28 MED ORDER — SODIUM CHLORIDE 0.9 % IV SOLN: 510.0000 mg | Freq: Once | INTRAVENOUS | Status: DC

## 2015-04-28 MED ORDER — SODIUM CHLORIDE 0.9 % IJ SOLN
10.0000 mL | INTRAMUSCULAR | Status: DC | PRN
  Administered 2015-04-28: 10 mL
  Filled 2015-04-28: qty 10

## 2015-04-28 MED ORDER — HEPARIN SOD (PORK) LOCK FLUSH 100 UNIT/ML IV SOLN
500.0000 [IU] | Freq: Once | INTRAVENOUS | Status: AC | PRN
  Administered 2015-04-28: 500 [IU]
  Filled 2015-04-28: qty 5

## 2015-05-03 ENCOUNTER — Ambulatory Visit (HOSPITAL_COMMUNITY)
Admission: RE | Admit: 2015-05-03 | Discharge: 2015-05-03 | Disposition: A | Discharge status: Home / Self Care | Payer: BLUE CROSS/BLUE SHIELD | Source: Ambulatory Visit | Attending: Hematology | Admitting: Hematology

## 2015-05-03 DIAGNOSIS — C189 Malignant neoplasm of colon, unspecified: Secondary | ICD-10-CM | POA: Diagnosis present

## 2015-05-03 DIAGNOSIS — C787 Secondary malignant neoplasm of liver and intrahepatic bile duct: Secondary | ICD-10-CM | POA: Diagnosis not present

## 2015-05-03 DIAGNOSIS — C19 Malignant neoplasm of rectosigmoid junction: Secondary | ICD-10-CM

## 2015-05-03 MED ORDER — GADOBENATE DIMEGLUMINE 529 MG/ML IV SOLN
20.0000 mL | Freq: Once | INTRAVENOUS | Status: AC | PRN
  Administered 2015-05-03: 19 mL via INTRAVENOUS

## 2015-05-08 NOTE — Progress Notes (Signed)
MRI report from 05/04/15 faxed to Dr. Johney Maine @ [facility] @ [phone] per Dr Ernestina Penna instructions.

## 2015-05-09 ENCOUNTER — Other Ambulatory Visit: Payer: Self-pay | Admitting: Hematology and Oncology

## 2015-05-10 ENCOUNTER — Other Ambulatory Visit (HOSPITAL_BASED_OUTPATIENT_CLINIC_OR_DEPARTMENT_OTHER): Payer: BLUE CROSS/BLUE SHIELD

## 2015-05-10 ENCOUNTER — Telehealth: Payer: Self-pay | Admitting: *Deleted

## 2015-05-10 ENCOUNTER — Other Ambulatory Visit (HOSPITAL_COMMUNITY)
Admission: RE | Admit: 2015-05-10 | Discharge: 2015-05-10 | Disposition: A | Discharge status: Home / Self Care | Payer: BLUE CROSS/BLUE SHIELD | Source: Ambulatory Visit | Attending: Hematology | Admitting: Hematology

## 2015-05-10 ENCOUNTER — Telehealth: Payer: Self-pay | Admitting: Hematology

## 2015-05-10 ENCOUNTER — Ambulatory Visit (HOSPITAL_BASED_OUTPATIENT_CLINIC_OR_DEPARTMENT_OTHER): Payer: BLUE CROSS/BLUE SHIELD

## 2015-05-10 ENCOUNTER — Ambulatory Visit (HOSPITAL_BASED_OUTPATIENT_CLINIC_OR_DEPARTMENT_OTHER): Payer: Self-pay

## 2015-05-10 ENCOUNTER — Other Ambulatory Visit: Payer: BLUE CROSS/BLUE SHIELD

## 2015-05-10 ENCOUNTER — Other Ambulatory Visit: Payer: Self-pay | Admitting: *Deleted

## 2015-05-10 ENCOUNTER — Ambulatory Visit: Payer: BLUE CROSS/BLUE SHIELD

## 2015-05-10 VITALS — BP 161/80 | HR 90 | Temp 98.7°F | Resp 18

## 2015-05-10 DIAGNOSIS — C187 Malignant neoplasm of sigmoid colon: Secondary | ICD-10-CM | POA: Diagnosis not present

## 2015-05-10 DIAGNOSIS — I2699 Other pulmonary embolism without acute cor pulmonale: Secondary | ICD-10-CM

## 2015-05-10 DIAGNOSIS — C180 Malignant neoplasm of cecum: Secondary | ICD-10-CM

## 2015-05-10 DIAGNOSIS — Z7901 Long term (current) use of anticoagulants: Secondary | ICD-10-CM | POA: Diagnosis present

## 2015-05-10 DIAGNOSIS — Z95828 Presence of other vascular implants and grafts: Secondary | ICD-10-CM

## 2015-05-10 DIAGNOSIS — C182 Malignant neoplasm of ascending colon: Secondary | ICD-10-CM

## 2015-05-10 DIAGNOSIS — C189 Malignant neoplasm of colon, unspecified: Secondary | ICD-10-CM

## 2015-05-10 LAB — CBC WITH DIFFERENTIAL/PLATELET
BASO%: 0.7 % (ref 0.0–2.0)
Basophils Absolute: 0 10*3/uL (ref 0.0–0.1)
EOS ABS: 0 10*3/uL (ref 0.0–0.5)
EOS%: 1.4 % (ref 0.0–7.0)
HCT: 24.1 % — ABNORMAL LOW (ref 34.8–46.6)
HGB: 7.6 g/dL — ABNORMAL LOW (ref 11.6–15.9)
LYMPH%: 17.2 % (ref 14.0–49.7)
MCH: 25.5 pg (ref 25.1–34.0)
MCHC: 31.4 g/dL — AB (ref 31.5–36.0)
MCV: 81 fL (ref 79.5–101.0)
MONO#: 0.5 10*3/uL (ref 0.1–0.9)
MONO%: 26.7 % — AB (ref 0.0–14.0)
NEUT%: 54 % (ref 38.4–76.8)
NEUTROS ABS: 1 10*3/uL — AB (ref 1.5–6.5)
PLATELETS: 100 10*3/uL — AB (ref 145–400)
RBC: 2.97 10*6/uL — AB (ref 3.70–5.45)
RDW: 23.1 % — ABNORMAL HIGH (ref 11.2–14.5)
WBC: 1.9 10*3/uL — AB (ref 3.9–10.3)
lymph#: 0.3 10*3/uL — ABNORMAL LOW (ref 0.9–3.3)
nRBC: 0 % (ref 0–0)

## 2015-05-10 LAB — COMPREHENSIVE METABOLIC PANEL (CC13)
ALT: 53 U/L (ref 0–55)
ANION GAP: 7 meq/L (ref 3–11)
AST: 55 U/L — ABNORMAL HIGH (ref 5–34)
Albumin: 3.1 g/dL — ABNORMAL LOW (ref 3.5–5.0)
Alkaline Phosphatase: 200 U/L — ABNORMAL HIGH (ref 40–150)
BILIRUBIN TOTAL: 0.62 mg/dL (ref 0.20–1.20)
BUN: 6.9 mg/dL — AB (ref 7.0–26.0)
CO2: 25 meq/L (ref 22–29)
CREATININE: 0.8 mg/dL (ref 0.6–1.1)
Calcium: 8.8 mg/dL (ref 8.4–10.4)
Chloride: 108 mEq/L (ref 98–109)
EGFR: 82 mL/min/{1.73_m2} — ABNORMAL LOW (ref >90)
Glucose: 149 mg/dl — ABNORMAL HIGH (ref 70–140)
Potassium: 3.4 mEq/L — ABNORMAL LOW (ref 3.5–5.1)
Sodium: 140 mEq/L (ref 136–145)
Total Protein: 5.8 g/dL — ABNORMAL LOW (ref 6.4–8.3)

## 2015-05-10 LAB — PROTIME-INR
INR: 4.7 — ABNORMAL HIGH (ref 0.00–1.49)
Prothrombin Time: 42.9 seconds — ABNORMAL HIGH (ref 11.6–15.2)

## 2015-05-10 LAB — POCT INR: INR: 4.7

## 2015-05-10 MED ORDER — SODIUM CHLORIDE 0.9 % IJ SOLN
10.0000 mL | INTRAMUSCULAR | Status: DC | PRN
  Administered 2015-05-10: 10 mL via INTRAVENOUS
  Filled 2015-05-10: qty 10

## 2015-05-10 MED ORDER — HEPARIN SOD (PORK) LOCK FLUSH 100 UNIT/ML IV SOLN
500.0000 [IU] | Freq: Once | INTRAVENOUS | Status: AC
  Administered 2015-05-10: 500 [IU] via INTRAVENOUS
  Filled 2015-05-10: qty 5

## 2015-05-10 NOTE — Patient Instructions (Signed)
Neutropenia Neutropenia is a condition that occurs when the level of a certain type of white blood cell (neutrophil) in your body becomes lower than normal. Neutrophils are made in the bone marrow and fight infections. These cells protect against bacteria and viruses. The fewer neutrophils you have, and the longer your body remains without them, the greater your risk of getting a severe infection becomes. CAUSES  The cause of neutropenia may be hard to determine. However, it is usually due to 3 main problems:   Decreased production of neutrophils. This may be due to:  Certain medicines such as chemotherapy.  Genetic problems.  Cancer.  Radiation treatments.  Vitamin deficiency.  Some pesticides.  Increased destruction of neutrophils. This may be due to:  Overwhelming infections.  Hemolytic anemia. This is when the body destroys its own blood cells.  Chemotherapy.  Neutrophils moving to areas of the body where they cannot fight infections. This may be due to:  Dialysis procedures.  Conditions where the spleen becomes enlarged. Neutrophils are held in the spleen and are not available to the rest of the body.  Overwhelming infections. The neutrophils are held in the area of the infection and are not available to the rest of the body. SYMPTOMS  There are no specific symptoms of neutropenia. The lack of neutrophils can result in an infection, and an infection can cause various problems. DIAGNOSIS  Diagnosis is made by a blood test. A complete blood count is performed. The normal level of neutrophils in human blood differs with age and race. Infants have lower counts than older children and adults. African Americans have lower counts than Caucasians or Asians. The average adult level is 1500 cells/mm3 of blood. Neutrophil counts are interpreted as follows:  Greater than 1000 cells/mm3 gives normal protection against infection.  500 to 1000 cells/mm3 gives an increased risk for  infection.  200 to 500 cells/mm3 is a greater risk for severe infection.  Lower than 200 cells/mm3 is a marked risk of infection. This may require hospitalization and treatment with antibiotic medicines. TREATMENT  Treatment depends on the underlying cause, severity, and presence of infections or symptoms. It also depends on your health. Your caregiver will discuss the treatment plan with you. Mild cases are often easily treated and have a good outcome. Preventative measures may also be started to limit your risk of infections. Treatment can include:  Taking antibiotics.  Stopping medicines that are known to cause neutropenia.  Correcting nutritional deficiencies by eating green vegetables to supply folic acid and taking vitamin B supplements.  Stopping exposure to pesticides if your neutropenia is related to pesticide exposure.  Taking a blood growth factor called sargramostim, pegfilgrastim, or filgrastim if you are undergoing chemotherapy for cancer. This stimulates white blood cell production.  Removal of the spleen if you have Felty's syndrome and have repeated infections. HOME CARE INSTRUCTIONS   Follow your caregiver's instructions about when you need to have blood work done.  Wash your hands often. Make sure others who come in contact with you also wash their hands.  Wash raw fruits and vegetables before eating them. They can carry bacteria and fungi.  Avoid people with colds or spreadable (contagious) diseases (chickenpox, herpes zoster, influenza).  Avoid large crowds.  Avoid construction areas. The dust can release fungus into the air.  Be cautious around children in daycare or school environments.  Take care of your respiratory system by coughing and deep breathing.  Bathe daily.  Protect your skin from cuts and   burns.  Do not work in the garden or with flowers and plants.  Care for the mouth before and after meals by brushing with a soft toothbrush. If you have  mucositis, do not use mouthwash. Mouthwash contains alcohol and can dry out the mouth even more.  Clean the area between the genitals and the anus (perineal area) after urination and bowel movements. Women need to wipe from front to back.  Use a water soluble lubricant during sexual intercourse and practice good hygiene after. Do not have intercourse if you are severely neutropenic. Check with your caregiver for guidelines.  Exercise daily as tolerated.  Avoid people who were vaccinated with a live vaccine in the past 30 days. You should not receive live vaccines (polio, typhoid).  Do not provide direct care for pets. Avoid animal droppings. Do not clean litter boxes and bird cages.  Do not share food utensils.  Do not use tampons, enemas, or rectal suppositories unless directed by your caregiver.  Use an electric razor to remove hair.  Wash your hands after handling magazines, letters, and newspapers. SEEK IMMEDIATE MEDICAL CARE IF:   You have a fever.  You have chills or start to shake.  You feel nauseous or vomit.  You develop mouth sores.  You develop aches and pains.  You have redness and swelling around open wounds.  Your skin is warm to the touch.  You have pus coming from your wounds.  You develop swollen lymph nodes.  You feel weak or fatigued.  You develop red streaks on the skin. MAKE SURE YOU:  Understand these instructions.  Will watch your condition.  Will get help right away if you are not doing well or get worse. Document Released: 04/11/2002 Document Revised: 01/12/2012 Document Reviewed: 05/09/2011 [employer] Patient Information 2015 [employer]. This information is not intended to replace advice given to you by your health care provider. Make sure you discuss any questions you have with your health care provider.  

## 2015-05-10 NOTE — Progress Notes (Signed)
*  seen in infusion area* no charge for the encounter*  Allison Mitchell's send out INR was 4.7 which is above her goal range of 2-3. Her last INR on 6/23 was 3.5, so it has continued to rise despite a dose decrease. She has been taking 5 mg on Mon and Fri and 2.5 mg all other days. She reports no bleeding and/or unusual bruising. She denies any medication changes or dietary changes and reports a normal appetite.  She is neutropenic today so her chemotherapy is going to be delayed until next week. We will see her then.   Plan: Hold today's and tomorrow's dose of Coumadin; then decrease dose to 5 mg on Mondays, 2.5 mg all other days.  Return next week for INR check when infusion is rescheduled to 7/14, and we will see her in the treatment area.

## 2015-05-10 NOTE — Telephone Encounter (Signed)
Confirmed appt for 07/13

## 2015-05-10 NOTE — Progress Notes (Signed)
Per Dr. Lindi Adie, treatment will be held today and postponed 1 week; no need for blood transfusion today per MD, pt asymptomatic.  Thu, Dr. Ernestina Penna RN notified and will make sure Dr. Burr Medico is aware of this change and place pof for appointments.  Neutropenic precautions reviewed with patient, verbalized understanding.

## 2015-05-10 NOTE — Patient Instructions (Signed)

## 2015-05-10 NOTE — Telephone Encounter (Signed)
Per staff message and POF I have scheduled appts. Advised scheduler of appts. JMW  

## 2015-05-11 LAB — CEA: CEA: 3 ng/mL (ref 0.0–5.0)

## 2015-05-14 ENCOUNTER — Other Ambulatory Visit: Payer: Self-pay | Admitting: Hematology

## 2015-05-16 ENCOUNTER — Ambulatory Visit: Payer: BLUE CROSS/BLUE SHIELD

## 2015-05-16 ENCOUNTER — Encounter: Payer: Self-pay | Admitting: Hematology

## 2015-05-16 ENCOUNTER — Other Ambulatory Visit: Payer: Self-pay | Admitting: *Deleted

## 2015-05-16 ENCOUNTER — Ambulatory Visit: Payer: BLUE CROSS/BLUE SHIELD | Admitting: Hematology

## 2015-05-16 ENCOUNTER — Ambulatory Visit (HOSPITAL_COMMUNITY)
Admission: RE | Admit: 2015-05-16 | Discharge: 2015-05-16 | Disposition: A | Discharge status: Home / Self Care | Payer: BLUE CROSS/BLUE SHIELD | Source: Ambulatory Visit | Attending: Hematology | Admitting: Hematology

## 2015-05-16 ENCOUNTER — Other Ambulatory Visit (HOSPITAL_BASED_OUTPATIENT_CLINIC_OR_DEPARTMENT_OTHER): Payer: BLUE CROSS/BLUE SHIELD

## 2015-05-16 ENCOUNTER — Ambulatory Visit (HOSPITAL_BASED_OUTPATIENT_CLINIC_OR_DEPARTMENT_OTHER): Payer: BLUE CROSS/BLUE SHIELD

## 2015-05-16 ENCOUNTER — Ambulatory Visit (HOSPITAL_BASED_OUTPATIENT_CLINIC_OR_DEPARTMENT_OTHER): Payer: BLUE CROSS/BLUE SHIELD | Admitting: Pharmacist

## 2015-05-16 ENCOUNTER — Telehealth: Payer: Self-pay | Admitting: Hematology

## 2015-05-16 VITALS — BP 153/93 | HR 81 | Temp 98.5°F

## 2015-05-16 DIAGNOSIS — I2699 Other pulmonary embolism without acute cor pulmonale: Secondary | ICD-10-CM

## 2015-05-16 DIAGNOSIS — Z5111 Encounter for antineoplastic chemotherapy: Secondary | ICD-10-CM | POA: Diagnosis not present

## 2015-05-16 DIAGNOSIS — C187 Malignant neoplasm of sigmoid colon: Secondary | ICD-10-CM | POA: Diagnosis not present

## 2015-05-16 DIAGNOSIS — C180 Malignant neoplasm of cecum: Secondary | ICD-10-CM

## 2015-05-16 DIAGNOSIS — C189 Malignant neoplasm of colon, unspecified: Secondary | ICD-10-CM

## 2015-05-16 DIAGNOSIS — C182 Malignant neoplasm of ascending colon: Secondary | ICD-10-CM

## 2015-05-16 DIAGNOSIS — Z95828 Presence of other vascular implants and grafts: Secondary | ICD-10-CM

## 2015-05-16 DIAGNOSIS — C19 Malignant neoplasm of rectosigmoid junction: Secondary | ICD-10-CM

## 2015-05-16 LAB — COMPREHENSIVE METABOLIC PANEL (CC13)
ALBUMIN: 3 g/dL — AB (ref 3.5–5.0)
ALK PHOS: 191 U/L — AB (ref 40–150)
ALT: 26 U/L (ref 0–55)
ANION GAP: 8 meq/L (ref 3–11)
AST: 30 U/L (ref 5–34)
BUN: 6.5 mg/dL — ABNORMAL LOW (ref 7.0–26.0)
CO2: 27 mEq/L (ref 22–29)
CREATININE: 0.9 mg/dL (ref 0.6–1.1)
Calcium: 8.9 mg/dL (ref 8.4–10.4)
Chloride: 106 mEq/L (ref 98–109)
EGFR: 73 mL/min/{1.73_m2} — ABNORMAL LOW (ref >90)
Glucose: 120 mg/dl (ref 70–140)
POTASSIUM: 3.6 meq/L (ref 3.5–5.1)
SODIUM: 141 meq/L (ref 136–145)
Total Bilirubin: 0.47 mg/dL (ref 0.20–1.20)
Total Protein: 6.1 g/dL — ABNORMAL LOW (ref 6.4–8.3)

## 2015-05-16 LAB — CBC WITH DIFFERENTIAL/PLATELET
BASO%: 1.3 % (ref 0.0–2.0)
BASOS ABS: 0 10*3/uL (ref 0.0–0.1)
EOS ABS: 0 10*3/uL (ref 0.0–0.5)
EOS%: 0.9 % (ref 0.0–7.0)
HCT: 25 % — ABNORMAL LOW (ref 34.8–46.6)
HGB: 7.8 g/dL — ABNORMAL LOW (ref 11.6–15.9)
LYMPH#: 0.5 10*3/uL — AB (ref 0.9–3.3)
LYMPH%: 16.9 % (ref 14.0–49.7)
MCH: 25.3 pg (ref 25.1–34.0)
MCHC: 31.2 g/dL — ABNORMAL LOW (ref 31.5–36.0)
MCV: 81.1 fL (ref 79.5–101.0)
MONO#: 0.9 10*3/uL (ref 0.1–0.9)
MONO%: 34.8 % — AB (ref 0.0–14.0)
NEUT#: 1.3 10*3/uL — ABNORMAL LOW (ref 1.5–6.5)
NEUT%: 46.1 % (ref 38.4–76.8)
Platelets: 163 10*3/uL (ref 145–400)
RBC: 3.08 10*6/uL — AB (ref 3.70–5.45)
RDW: 23.9 % — ABNORMAL HIGH (ref 11.2–14.5)
WBC: 2.7 10*3/uL — AB (ref 3.9–10.3)

## 2015-05-16 LAB — PROTIME-INR
INR: 2.9 (ref 2.00–3.50)
Protime: 34.8 Seconds — ABNORMAL HIGH (ref 10.6–13.4)

## 2015-05-16 LAB — POCT INR: INR: 2.9

## 2015-05-16 IMAGING — CT CT ABD-PELV W/ CM
2 of 5 series · 17 of 46 positions shown, 19 images · IV contrast (OMNIPAQUE)
Comparison: 10/02/2014

CLINICAL DATA: Followup colon carcinoma. Status post surgical
resection and colostomy. Beginning chemotherapy.

EXAM:
CT ABDOMEN AND PELVIS WITH CONTRAST
TECHNIQUE: Multidetector CT imaging of the abdomen and pelvis was performed
using the standard protocol following bolus administration of
intravenous contrast.
CONTRAST:  100mL OMNIPAQUE IOHEXOL 300 MG/ML  SOLN

[Series 2: rtn a/p with · axial · 0.78mm/px · z∈[+814,+1208]mm · 14 of 89 slices shown, 16 images]
[im 5/89  soft-tissue]
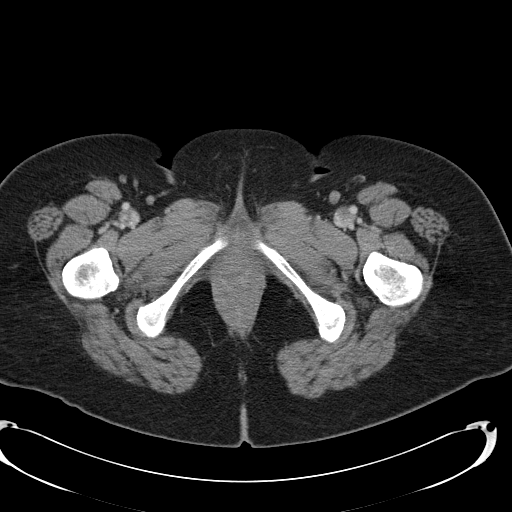
[im 5/89  bone]
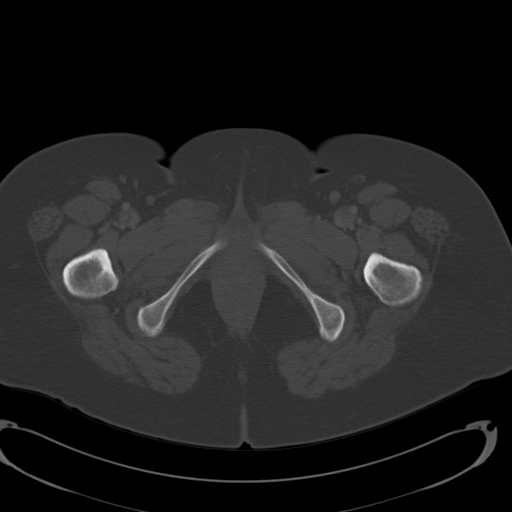
[im 10/89  soft-tissue]
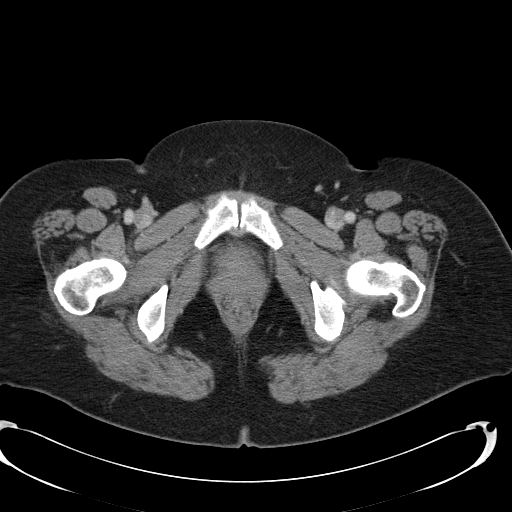
[im 19/89  soft-tissue]
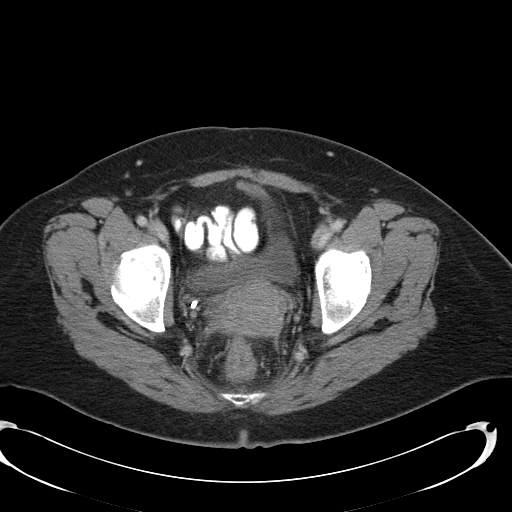
[im 24/89  soft-tissue]
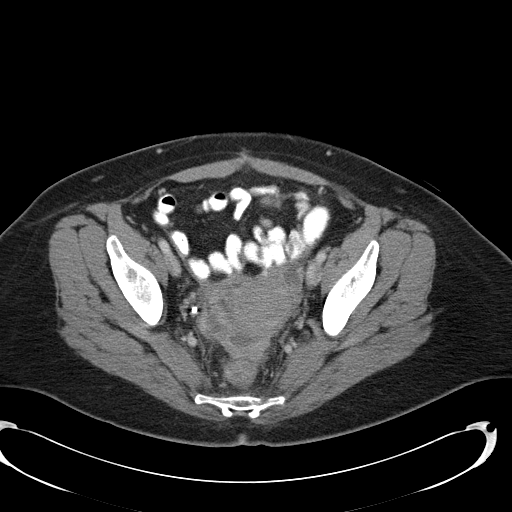
[im 28/89  soft-tissue]
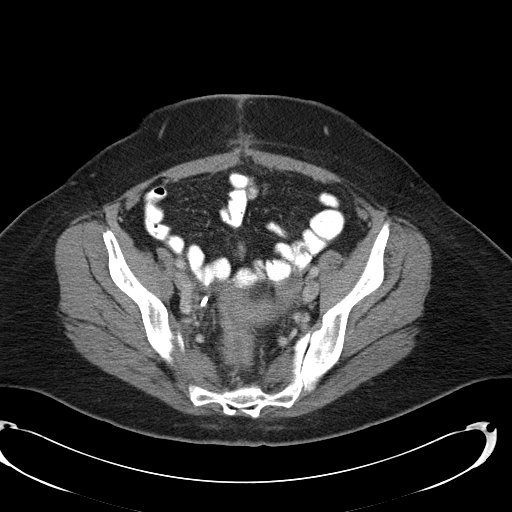
[im 38/89  soft-tissue]
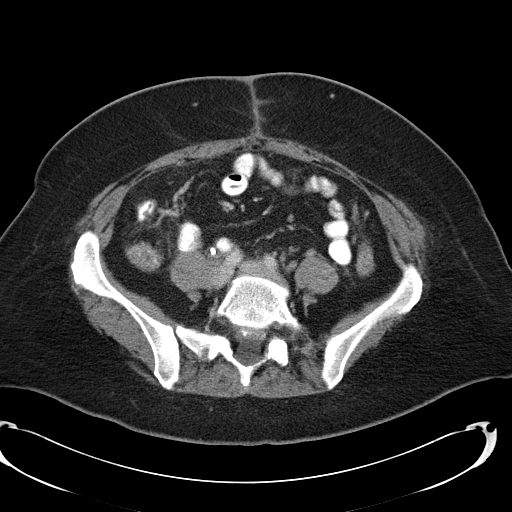
[im 42/89  soft-tissue]
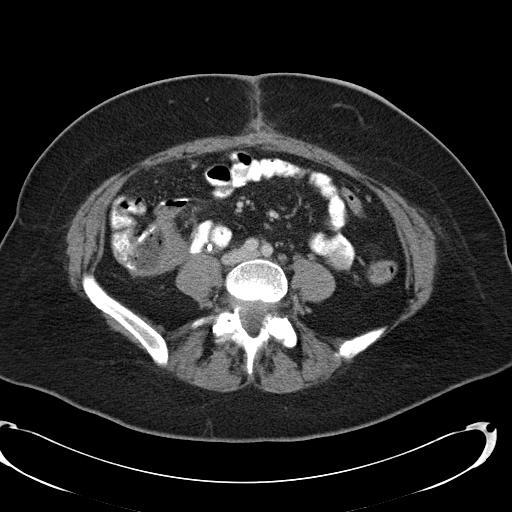
[im 47/89  soft-tissue]
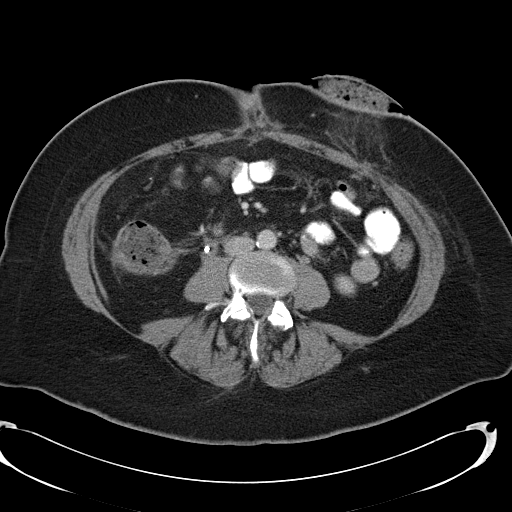
[im 51/89  soft-tissue]
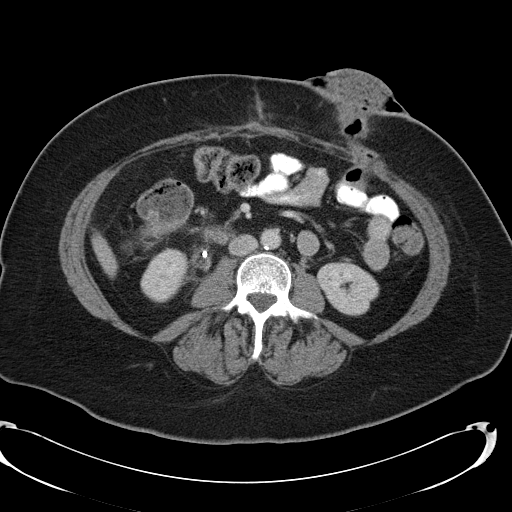
[im 51/89  bone]
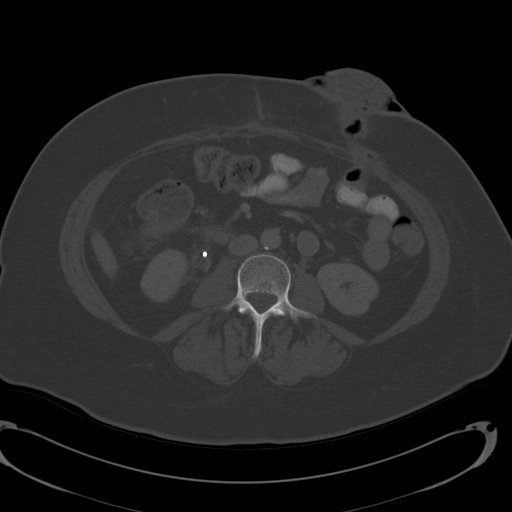
[im 61/89  soft-tissue]
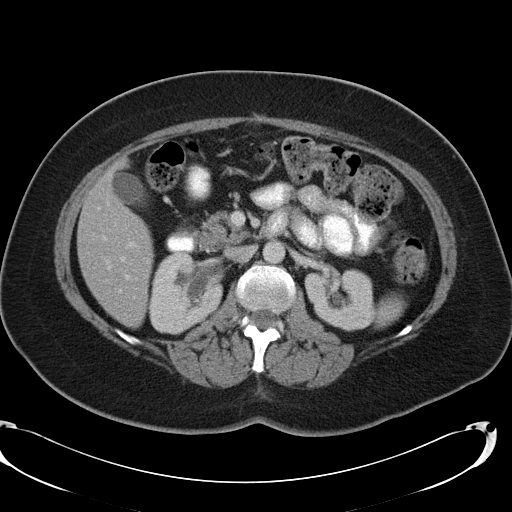
[im 65/89  soft-tissue]
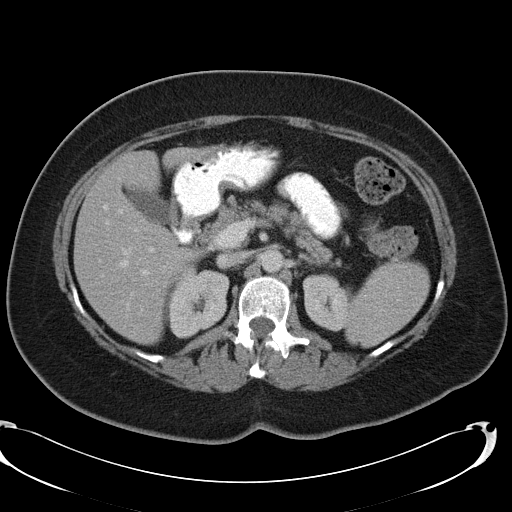
[im 70/89  soft-tissue]
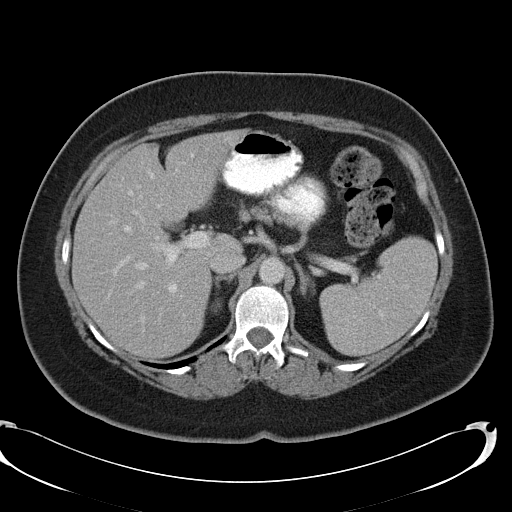
[im 79/89  soft-tissue]
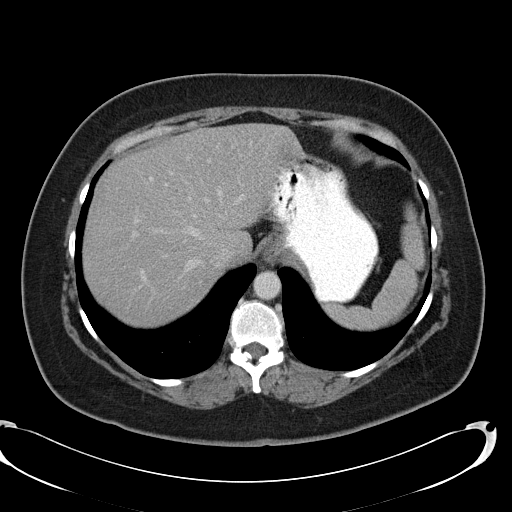
[im 84/89  soft-tissue]
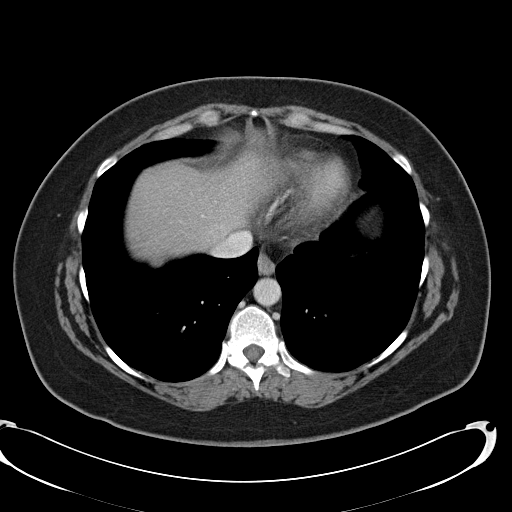

[Series 602: <mpr thick range> · coronal · 0.87mm/px · 3 of 89 slices shown]
[im 30/89  soft-tissue]
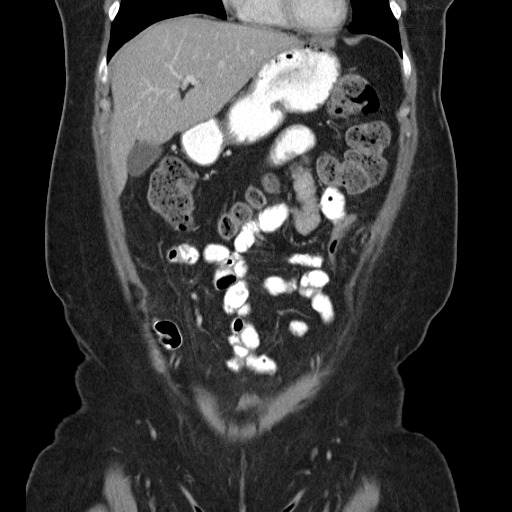
[im 40/89  soft-tissue]
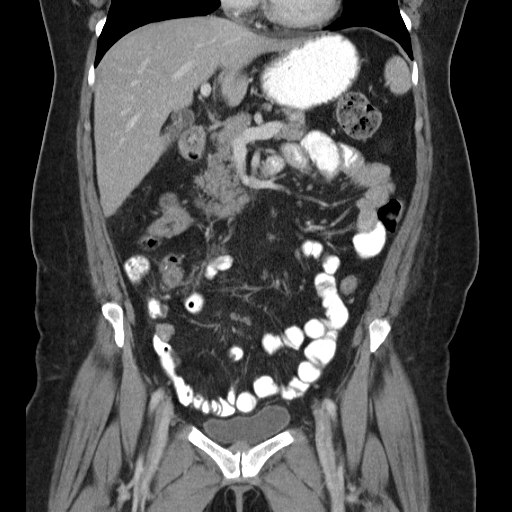
[im 49/89  soft-tissue]
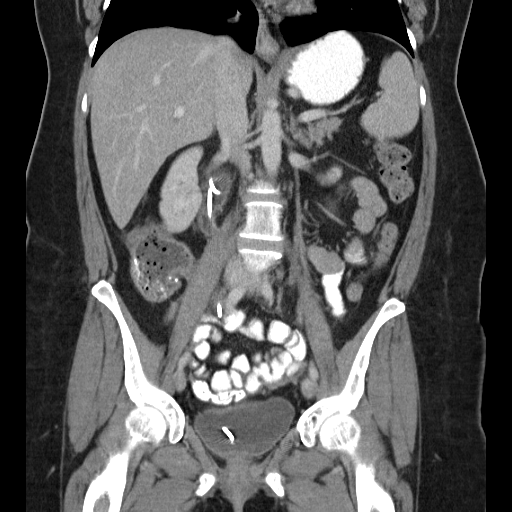

[17 of 46 positions shown; findings below may reference images not displayed]

FINDINGS: Lower Chest:  Unremarkable.

Hepatobiliary: No masses or other significant abnormality
identified.

Pancreas: No mass, inflammatory changes, or other significant
abnormality identified.

Spleen:  Within normal limits in size and appearance.

Adrenal Glands:  No mass identified.

Kidneys/Urinary Tract: No masses identified. Right ureteral stent is
now seen in place with decreased hydronephrosis compared to previous
study.

Stomach/Bowel/Peritoneum: Patient has undergone resection of
previously seen right lower quadrant mass with left lower quadrant
colostomy. No residual mass or bowel wall thickening visualized.

Vascular/Lymphatic: No pathologically enlarged lymph nodes
identified. No other significant abnormality visualized.

Reproductive: No masses identified. A new complex fluid collection
is seen in the right pelvis between the uterus and right ureter
which measures approximately 2.9 x 5.9 cm on image 67/series 2.
Postop abscess cannot be excluded.

Other:  None.

Musculoskeletal:  No suspicious bone lesions identified.
IMPRESSION: New complex fluid collection in the right pelvis measuring 2.9 x
cm. Postop abscess cannot be excluded.

Decrease right hydronephrosis with ureteral stent in appropriate
position.

No evidence of abdominal or pelvic metastatic disease.

## 2015-05-16 MED ORDER — FLUOROURACIL CHEMO INJECTION 5 GM/100ML
2400.0000 mg/m2 | INTRAVENOUS | Status: DC
  Administered 2015-05-16: 5150 mg via INTRAVENOUS
  Filled 2015-05-16: qty 103

## 2015-05-16 MED ORDER — FLUOROURACIL CHEMO INJECTION 2.5 GM/50ML
400.0000 mg/m2 | Freq: Once | INTRAVENOUS | Status: AC
  Administered 2015-05-16: 850 mg via INTRAVENOUS
  Filled 2015-05-16: qty 17

## 2015-05-16 MED ORDER — OXYCODONE HCL 5 MG PO TABS: 5.0000 mg | ORAL_TABLET | Freq: Four times a day (QID) | ORAL | Status: DC | PRN

## 2015-05-16 MED ORDER — DEXTROSE 5 % IV SOLN
Freq: Once | INTRAVENOUS | Status: AC
  Administered 2015-05-16: 12:00:00 via INTRAVENOUS

## 2015-05-16 MED ORDER — OXYCODONE HCL 5 MG PO TABS
5.0000 mg | ORAL_TABLET | Freq: Four times a day (QID) | ORAL | Status: DC | PRN
Start: 2015-05-16 — End: 2015-05-16

## 2015-05-16 MED ORDER — SODIUM CHLORIDE 0.9 % IV SOLN
Freq: Once | INTRAVENOUS | Status: AC
  Administered 2015-05-16: 12:00:00 via INTRAVENOUS
  Filled 2015-05-16: qty 4

## 2015-05-16 MED ORDER — SODIUM CHLORIDE 0.9 % IJ SOLN
10.0000 mL | INTRAMUSCULAR | Status: DC | PRN
  Administered 2015-05-16: 10 mL via INTRAVENOUS
  Filled 2015-05-16: qty 10

## 2015-05-16 MED ORDER — OXALIPLATIN CHEMO INJECTION 100 MG/20ML
85.0000 mg/m2 | Freq: Once | INTRAVENOUS | Status: AC
  Administered 2015-05-16: 185 mg via INTRAVENOUS
  Filled 2015-05-16: qty 37

## 2015-05-16 MED ORDER — HEPARIN SOD (PORK) LOCK FLUSH 100 UNIT/ML IV SOLN
500.0000 [IU] | Freq: Once | INTRAVENOUS | Status: DC | PRN
  Filled 2015-05-16: qty 5

## 2015-05-16 MED ORDER — SODIUM CHLORIDE 0.9 % IJ SOLN
10.0000 mL | INTRAMUSCULAR | Status: DC | PRN
  Filled 2015-05-16: qty 10

## 2015-05-16 MED ORDER — LEUCOVORIN CALCIUM INJECTION 350 MG
400.0000 mg/m2 | Freq: Once | INTRAMUSCULAR | Status: AC
  Administered 2015-05-16: 860 mg via INTRAVENOUS
  Filled 2015-05-16: qty 43

## 2015-05-16 MED ORDER — WARFARIN SODIUM 5 MG PO TABS: 5.0000 mg | ORAL_TABLET | Freq: Every day | ORAL | Status: DC

## 2015-05-16 NOTE — Patient Instructions (Signed)
Continue 2.5mg  daily except 5mg  on Mondays. Recheck INR when here for treatment on 05/31/15 and we will see you in the infusion room.

## 2015-05-16 NOTE — Progress Notes (Signed)
INR = 2.9 today, within goal. Hg/Hct: 7.8/25, Pltc = 163 Pt took coumadin as instructed at last visit. No changes in diet or medications. No usual bruising. No bleeding noted. No s/s of clotting noted. Continue 2.5mg  daily except 5mg  on Mondays. Recheck INR when here for treatment on 05/31/15.  No charge encounter.

## 2015-05-16 NOTE — Progress Notes (Signed)
Pt saw Dr. Burr Medico today prior to chemo.  OK to treat as per md.

## 2015-05-16 NOTE — Telephone Encounter (Signed)
s.w. pt and advised on July appt...ok and ...she will also get sched in chemo

## 2015-05-16 NOTE — Patient Instructions (Signed)
[facility] Discharge Instructions for Patients Receiving Chemotherapy  Today you received the following chemotherapy agents Oxaliplatin/Leucovorin/Fluorouricil.  To help prevent nausea and vomiting after your treatment, we encourage you to take your nausea medication as directed.   If you develop nausea and vomiting that is not controlled by your nausea medication, call the clinic.   BELOW ARE SYMPTOMS THAT SHOULD BE REPORTED IMMEDIATELY:  *FEVER GREATER THAN 100.5 F  *CHILLS WITH OR WITHOUT FEVER  NAUSEA AND VOMITING THAT IS NOT CONTROLLED WITH YOUR NAUSEA MEDICATION  *UNUSUAL SHORTNESS OF BREATH  *UNUSUAL BRUISING OR BLEEDING  TENDERNESS IN MOUTH AND THROAT WITH OR WITHOUT PRESENCE OF ULCERS  *URINARY PROBLEMS  *BOWEL PROBLEMS  UNUSUAL RASH Items with * indicate a potential emergency and should be followed up as soon as possible.  Feel free to call the clinic you have any questions or concerns. The clinic phone number is [phone].  Please show the CHEMO ALERT CARD at check-in to the Emergency Department and triage nurse.    

## 2015-05-16 NOTE — Progress Notes (Signed)
12:05 - Per Dr. Burr Medico okay to proceed with treatment with ANC 1.3 and hgb 7.8.  Pt asymptomatic and Dr. Burr Medico does not wish to transfuse at this time.

## 2015-05-16 NOTE — Progress Notes (Signed)
[name]  [phone] Fax:([phone]) [place] Note   Patient Care Team: [name], MD as PCP - General (Family Medicine)  CHIEF COMPLAINTS:  Follow-up colon cancer  Oncology History   Next week.Colorectal cancer, stage III   Staging form: Neuroendocrine Tumor - Colon/Rectum, AJCC 7th Edition     Clinical: Stage Unknown (T4, NX, M0) - Unsigned     Pathologic: No stage assigned - Unsigned       Colorectal cancer, stage III   10/04/2014 Tumor Marker CEA 1.0 .  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 mpT4aN2aM0,stage IIIC.    10/10/2014 Surgery partial right hemicolectomy with removal of terminal ileum, sigmoid colectomy, right ureter lysis, (+) residual surgical margin    11/23/2014 - 01/06/2015 Chemotherapy FOLFOX every 2 weeks for 4 cycles    01/23/2015 - 03/02/2015 Radiation Therapy adjuvant radiation   03/27/2015 Progression CT chest, abdomen and pelvis showed new capsular nodules along the posterior margin of hepatic segment 6, measuring 2.1X1.1cm, and then you omental nodule, suspicious for new metastasis.   03/29/2015 -  Chemotherapy Restarted FOLFOX every 2 weeks   OTHER ISSUES: 1. Pulmonary embolism in segment omental branches of the right lung, diagnosed on 11/28/2014 2. Right ureter stent placed on 10/20/2014  CURRENT THERAPY: FOLFOX every 2 weeks, started on 11/23/2014, restarted on 03/29/2015 after radiation   HISTORY OF PRESENTING ILLNESS:  Allison Mitchell 54 y.o. female is here for follow-up after her hospital discharge. She was recently diagnosed with 3 synchronous to colon cancer, status post surgical resection. I initially saw her when she was diagnosed in the hospital.  She presented with some GI symptoms since last October 2014 at which time she  was diagnosed with ischemic colitis. Due to guaiac-positive stools, patient had undergone an outpatient colonoscopy on 09/26/2014, which showed inflamed and ulcerated mucosa in the terminal ileum, and a 1.5 cm mass in the ascending colon and additional 1.5 cm mass in the mid sigmoid colon. All biopsy from the above 3 sites came back positive for invasive moderate differentiated adenocarcinoma, and the sigmoid mass containing squamous cell differentiation.   She was hospitalized for worsening of her symptoms on 10/03/2014.  A CT of the abdomen and pelvis with contrast on 10/02/2014 was remarkable for soft tissue/bowel wall thickening of the right lower pelvis center at the cecum-terminal ileum involving a portion of the adjacent sigmoid colon. This causes small bowel obstruction and obstructed the distal right ureter causing moderate to severe right hydroureteronephrosis. She was [facility] to [facility] for surgical management and further testing. CT of the chest with contrast on 10/03/2014 negative for metastatic disease. MRI of the liver without and without contrast on 10/03/2014 is remarkable for small hepatic cysts, but no findings suspicious for hepatic metastatic disease. Right-sided hydroureteronephrosis was once again seen, with early small bowel obstruction bowel gas pattern.  She underwent right hemicolectomy with primary anastomosis with sigmoid colectomy with end colostomy on Friday, Dec 5, after cystoscopy is performed on 12/4 to rule out GU invasion and a ureteral stent placement.  She has family history of colon cancer in her grandmother and polyps in her sister. Denies risk factors for HIV or hepatitis. Denies Diabetes. No Tobacco. No ETOH. Denies prior radiation. SHe did consume significant amount of red meat until recently.  We are asked to see the patient in consultation, with recommendations on the oncology standpoint  She is recovering slowly after the  surgery. She had the surgical suture opened about 3 weeks ago and was treated for antibiotics , and now much improved. She has no significant pain, but mild abdominal discomfort, no nausea, her stool is getting thicker, she clean her colostomy bag daily. She was seen by Dr. Hulen Skains this morning. She noticed some cloudy urine in the past few days, with mild burning sensation, no fever or back pain. Her appetite is getting better, eats well.  She lost about 25 lbs. No fever or chills. No other new complains.   INTERIM HISTORY: Allison Mitchell returns for follow-up and cycle 8 chemotherapy, which was postponed from last week due to her cytopenia. She feels well overall. She still has intermittent right flank pain, for which she takes oxycodone as needed, usually 1 tablet every few days. No other complaints. She works at her [employer] office, able tolerate routine activity without much difficulty. Denies any fever or bleeding. No other complaints.   MEDICAL HISTORY:  Past Medical History  Diagnosis Date  . GERD (gastroesophageal reflux disease)   . Depression   . Anxiety   . 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 Peripheral neuropathy due to chemotherapy   . Hydronephrosis, right     malignant--  s/p reimiplant right ureter  . History of pulmonary embolus (PE)     11-28-2014--  due to chemotherapy on coumadin  . Anticoagulated on Coumadin   . Colorectal cancer, stage III oncologist--  dr Stefani Dama    Dx  10-10-2014---Stage IIIC, mpT4aN2aM0, Multifocal synchronous, Moderate differentiated Invasive, Mets to nodes (5 out of 17)/  s/p  Resection tumor cecum, terminal ileum, sigmoid/  currently chemoradiation therapy  . Dysuria   . S/P radiation therapy 03/02/15    completed pelvis/abd/50.4Gy    SURGICAL HISTORY: Past Surgical History  Procedure Laterality Date  . Cystoscopy with retrograde pyelogram, ureteroscopy and stent placement Right 10/05/2014    Procedure: [place], URETEROSCOPY  AND STENT PLACEMENT;  Surgeon: Alexis Frock, MD;  Location: [facility];  Service: Urology;  Laterality: Right;  . Laparotomy N/A 10/10/2014    Procedure: EXPLORATORY LAPAROTOMY;  Surgeon: Doreen Salvage, MD;  Location: [place];  Service: General;  Laterality: N/A;  . Partial colectomy  10/10/2014    Procedure: PARTIAL SIGMOID COLECTOMY;  Surgeon: Doreen Salvage, MD;  Location: [place];  Service: General;;  . Colostomy  10/10/2014    Procedure: COLOSTOMY;  Surgeon: Doreen Salvage, MD;  Location: [place];  Service: General;;  . Colostomy revision  10/10/2014    Procedure: PARTIAL RIGHT COLECTOMY;  Surgeon: Doreen Salvage, MD;  Location: [place];  Service: General;;  . Ureteral reimplantion Right 10/10/2014    Procedure: OPEN RIGHT URETERAL LYSIS ;  Surgeon: Alexis Frock, MD;  Location: [place];  Service: Urology;  Laterality: Right;  . Portacath placement N/A 10/17/2014    Procedure: INSERTION PORT-A-CATH LEFT SUBCLAVIAN AND MIDLINE INCISION STAPLE REMOVAL ;  Surgeon: Coralie Keens, MD;  Location: [place];  Service: General;  Laterality: N/A;  . Cystoscopy w/ retrogrades Right 01/10/2015    Procedure: CYSTOSCOPY WITH RETROGRADE PYELOGRAM;  Surgeon: Alexis Frock, MD;  Location: [facility];  Service: Urology;  Laterality: Right;  . Cystoscopy w/ ureteral stent placement Right 01/10/2015    Procedure: CYSTOSCOPY WITH STENT REPLACEMENT;  Surgeon: Alexis Frock, MD;  Location: [place];  Service: Urology;  Laterality: Right;    SOCIAL HISTORY: History   Social History  . Marital Status: Married    Spouse Name: N/A  . Number of Children: N/A  . Years of Education: N/A   Occupational History  . Not on file.   Social History Main Topics  . Smoking status: Never Smoker   . Smokeless tobacco: Never Used  . Alcohol Use: No  . Drug Use: No  . Sexual Activity: No   Other Topics Concern  . 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 Cancer Mother     skin cancer   . Diabetes Father   . CAD Father     Onset in his 69s, has 7 stents  . Cancer Maternal Grandmother 10    colon cancer   . Cancer Paternal Grandmother 24    breast cancer     ALLERGIES:  is allergic to amoxicillin.  MEDICATIONS:  Current Outpatient Prescriptions  Medication Sig Dispense Refill  . citalopram (CELEXA) 20 MG tablet Take 20-40 mg by mouth every morning.   1  . enoxaparin (LOVENOX) 150 MG/ML injection Inject 140 mg into the skin daily.    Marland Kitchen escitalopram (LEXAPRO) 10 MG tablet Take 10 mg by mouth daily.    Marland Kitchen lidocaine-prilocaine (EMLA) cream Apply 1 application topically as needed. 30 g 0  . magnesium hydroxide (MILK OF MAGNESIA) 400 MG/5ML suspension Take 5 mLs by mouth daily as needed for mild constipation.    . Meth-Hyo-M Bl-Na Phos-Ph Sal (URIBEL) 118 MG CAPS Take 1 capsule by mouth every 8 (eight) hours as needed (pain). Every 8 hours as needed  11  . omeprazole (PRILOSEC) 40 MG capsule Take 40 mg by mouth daily as needed (heart burn.).   12  . ondansetron (ZOFRAN) 8 MG tablet Take 1 tablet (8 mg total) by mouth every 8 (eight) hours as needed for nausea or vomiting. 20 tablet 3  . oxyCODONE (OXY IR/ROXICODONE) 5 MG immediate release tablet Take 1 tablet (5 mg total) by mouth every 6 (six) hours as needed for severe pain. 30 tablet 0  . potassium chloride SA (K-DUR,KLOR-CON) 20 MEQ tablet Take 1 tablet (20 mEq total) by mouth daily. 14 tablet 1  . PREVIDENT 5000 BOOSTER PLUS 1.1 % PSTE 1 each by Other route daily.   5  . warfarin (COUMADIN) 5 MG tablet Take 1 tablet (5 mg total) by mouth daily. Take one tablet ($RemoveBef'5mg'llAnjkzjrU$ ) on MWF and half a tablet (2.$RemoveBef'5mg'GHNKNeVDgz$ ) on all other days. 30 tablet 3  . zolpidem (AMBIEN) 5 MG tablet Take 1 tablet (5 mg total) by mouth at bedtime as needed for sleep. 30 tablet 0    No current facility-administered medications for this visit.   Facility-Administered Medications Ordered in Other Visits  Medication Dose Route Frequency Provider Last Rate Last Dose  . fluorouracil (ADRUCIL) 5,150 mg in sodium chloride 0.9 % 147 mL chemo infusion  2,400 mg/m2 (Treatment Plan Actual) Intravenous 1 day or 1 dose Nicholas Lose, MD   5,150 mg at 05/16/15 1514  . heparin lock flush 100 unit/mL  500 Units Intracatheter Once PRN Nicholas Lose, MD      . sodium chloride 0.9 % injection 10 mL  10 mL  Intracatheter PRN Truitt Merle, MD      . sodium chloride 0.9 % injection 10 mL  10 mL Intracatheter PRN Nicholas Lose, MD        REVIEW OF SYSTEMS:   Constitutional: Denies fevers, chills or abnormal night sweats Eyes: Denies blurriness of vision, double vision or watery eyes Ears, nose, mouth, throat, and face: Denies mucositis or sore throat Respiratory: Denies cough, dyspnea or wheezes Cardiovascular: Denies palpitation, chest discomfort or lower extremity swelling Gastrointestinal:  Denies nausea, heartburn or change in bowel habits Skin: Denies abnormal skin rashes Lymphatics: Denies new lymphadenopathy or easy bruising Neurological:Denies numbness, tingling or new weaknesses Behavioral/Psych: Mood is stable, no new changes  All other systems were reviewed with the patient and are negative.  PHYSICAL EXAMINATION: ECOG PERFORMANCE STATUS: 1 - Symptomatic but completely ambulatory  There were no vitals filed for this visit. There were no vitals filed for this visit.  GENERAL:alert, no distress and comfortable SKIN: skin color, texture, turgor are normal, no rashes or significant lesions EYES: normal, conjunctiva are pink and non-injected, sclera clear OROPHARYNX:no exudate, no erythema and lips, buccal mucosa, and tongue normal  NECK: supple, thyroid normal size, non-tender, without nodularity LYMPH:  no palpable lymphadenopathy in the cervical, axillary or inguinal LUNGS:  clear to auscultation and percussion with normal breathing effort HEART: regular rate & rhythm and no murmurs and no lower extremity edema ABDOMEN:abdomen soft, non-tender and normal bowel sounds. Positive for colostomy bag on the left, with normal-appearing stool. Surgical wound upper part is well-healed. Musculoskeletal:no cyanosis of digits and no clubbing  PSYCH: alert & oriented x 3 with fluent speech NEURO: no focal motor/sensory deficits  LABORATORY DATA:  CBC Latest Ref Rng 05/16/2015 05/10/2015 04/26/2015  WBC 3.9 - 10.3 10e3/uL 2.7(L) 1.9(L) 9.2  Hemoglobin 11.6 - 15.9 g/dL 7.8(L) 7.6(L) 8.2(L)  Hematocrit 34.8 - 46.6 % 25.0(L) 24.1(L) 26.9(L)  Platelets 145 - 400 10e3/uL 163 100(L) 123(L)    CMP Latest Ref Rng 05/16/2015 05/10/2015 04/26/2015  Glucose 70 - 140 mg/dl 120 149(H) 139  BUN 7.0 - 26.0 mg/dL 6.5(L) 6.9(L) 11.0  Creatinine 0.6 - 1.1 mg/dL 0.9 0.8 1.0  Sodium 136 - 145 mEq/L 141 140 143  Potassium 3.5 - 5.1 mEq/L 3.6 3.4(L) 3.4(L)  Chloride 96 - 112 mmol/L - - -  CO2 22 - 29 mEq/L $Remove'27 25 27  'gCuuSGZ$ Calcium 8.4 - 10.4 mg/dL 8.9 8.8 8.5  Total Protein 6.4 - 8.3 g/dL 6.1(L) 5.8(L) 5.9(L)  Total Bilirubin 0.20 - 1.20 mg/dL 0.47 0.62 0.35  Alkaline Phos 40 - 150 U/L 191(H) 200(H) 179(H)  AST 5 - 34 U/L 30 55(H) 30  ALT 0 - 55 U/L 26 53 25   PATHOLOGY REPORTS:  Surgical path 10/10/2014 1. Colon, segmental resection for tumor, Cecum, terminal ileum, sigmoid colon - MULTIFOCAL INVASIVE ADENOCARCINOMA, MODERATELY DIFFERENTIATED, THE LARGEST FOCUS SPANS 4.0 CM. - ADENOCARCINOMA INVOLVES SEROSA. - LYMPHOVASCULAR INVASION IS IDENTIFIED. - METASTATIC CARCINOMA IN 5 OF 17 LYMPH NODES (5/17). - THE PROXIMAL AND DISTAL SURGICAL RESECTION MARGINS ARE NEGATIVE FOR ADENOCARCINOMA. - SEE ONCOLOGY TABLE BELOW. 2. Soft tissue, biopsy, Right periureteral tissue - ADENOCARCINOMA. 3. Soft tissue, biopsy, Right periureteral tissue - INFLAMED AND DEGENERATING FIBROADIPOSE TISSUE. - THERE IS NO  EVIDENCE OF MALIGNANCY. Microscopic Comment 1. COLON AND RECTUM (INCLUDING TRANS-ANAL RESECTION): Specimen: Distal ileum, cecum, proximal ascending colon, and adherent sigmoid colon. Procedure: Resection. Tumor site: Multiple foci, including ileocecal valve, ascending colon and sigmoid. Specimen integrity: Disrupted. Macroscopic intactness of mesorectum: N/A  Macroscopic tumor perforation: Present. Invasive tumor: Maximum size: 4.0 cm. Histologic type(s): Adenocarcinoma. Histologic grade and differentiation: G2: moderately differentiated Type of polyp in which invasive carcinoma arose: Tubulovillous adenomas. Microscopic extension of invasive tumor: Focally involves the serosa. Lymph-Vascular invasion: Present. Peri-neural invasion: Not identified. Tumor deposit(s) (discontinuous extramural extension): Not identified. Resection margins: Proximal margin: 4.0 cm Distal margin: 3.5 cm Circumferential (radial) (posterior ascending, posterior descending; lateral and posterior mid-rectum; and entire lower 1/3 rectum): Cannot be assessed. Mesenteric margin (sigmoid and transverse): Cannot be assessed. Treatment effect (neo-adjuvant therapy): N/A Additional polyp(s): Not identified. Non-neoplastic findings: No significant findings. Lymph nodes: number examined 17; number positive: 5 Pathologic Staging: mpT4a, pN2a, pMX Ancillary studies: A block will be sent for MSI testing by [facility] and MSI testing by PCR and the results reported separately. Comment: There appear to be three synchronous primary tumors in the specimen, one at the ileocecal valve, one in the ascending colon, and one at the sigmoid colon. Each of these foci contain an associated tubulovillous adenoma, suggesting three separate primary sites. The tumor in the sigmoid colon appears to involve the serosa as well. The other two tumors show extension into at least the pericolonic soft tissue. Dr Mali Rund has reviewed selected slides  and concurs that there are likely three synchronous tumors here. The case was discussed with Dr Hulen Skains. Per Dr Hulen Skains, the specimen in #2 is a contiguous piece of tissue 2 of 4 Amended copy Amended FINAL for NAUTICA, HOTZ [phone]) Microscopic Comment(continued) with the main tumor, and therefore, not considered a distant metastatic focus. Therefore, the tumor is staged as an MX. (JBK:ecj 10/16/2014) JOSHUA  Mismatch Repair (MMR) Protein Immunohistochemistry (IHC) IHC Expression Result: MLH1: Preserved nuclear expression (greater 50% tumor expression) MSH2: Preserved nuclear expression (greater 50% tumor expression) MSH6: Preserved nuclear expression (greater 50% tumor expression) PMS2: Preserved nuclear expression (greater 50% tumor expression) * Internal control demonstrates intact nuclear expression Interpretation: NORMAL There is preserved expression  Diagnosis 04/16/2015 Liver, needle/core biopsy - METASTATIC ADENOCARCINOMA, SEE COMMENT. Microscopic Comment There are well formed malignant glands with mucin. The morphology combined with the patient's history are consistent with metastatic colorectal carcinoma. Dr. Burr Medico was notified on 04/17/2015. Per request, tissue will be sent for RAS testing.     RADIOGRAPHIC STUDIES: I have personally reviewed the radiological images as listed and agreed with the findings in the report.  CT of chest, abdomen and pelvis 03/27/2015  IMPRESSION: 1. Suspicion for new peritoneal metastatic disease including a 2.1 by 1.1 cm capsular nodule along the posterior margin of hepatic segment 6, and a new small right anterior omental nodule. 2. Several additional hypodense tiny lesions in the liver are stable. 3. Moderate right hydronephrosis and hydroureter with periureteral stranding, despite the presence of the double-J ureteral stent. However, there is no asymmetry in enhancement or excretion of the kidneys. 4. Prominence of stool in  the colon extending to the colostomy site. 5. 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  Abdominal MRI with and without contrast 05/03/2015 IMPRESSION: Irregular peripheral enhancement of the new lesion seen along the posterior capsule of the right liver is highly suspicious for metastatic involvement in this patient with a history of colon cancer. This lesion may be amenable to percutaneous tissue sampling, if clinically warranted.   ASSESSMENT & PLAN:  54 year old Caucasian female with minimal past medical history, who  was found to have 3 multifocal colon cancer at terminal ileum/cecum, ascending colon and sigmoid colon. All of them has adenocarcinoma,  And the cecum tumor has squamous differentiation. The cecum tumor has directly invaded the soft tissue along the right ureter, which was not completely resected (1% tumor left over, per Dr. Hulen Skains). She has mpT4aN2aM0 stage IIIB, MSI/MMR normal.   1. Multifocal stage IIIB colon cancer, MMR normal, MSI stable, biopsy confirmed liver and probable peritoneal metastases, KRAS mutated  -I discussed her surgical past findings extensively with patient and her husband. Unfortunately, the tumor was not completely resected. She has very small amount of tumor left over. -Given her residual tumor after surgery, advanced stage III disease, high risk recurrence in the future,  we recommended sequential adjuvant chemotherapy and irradiation -Her restaging scan after radiation unfortunately showed  probable peritoneal metastasis in liver and omentum. Liver biopsy confirmed metastasis.  -She was referred and seen by Dr. Johney Maine to consider surgical debulking and HIPEC. Per their recommendation, I'll obtain a liver MRI and continue chemotherapy for 2 more months, then  restaging and further surgery. -I reviewed with the abdominal MRI findings with her. The biopsied liver metastases is stable, there is additional 5 mm lesions on MRI, no significant peritoneal metastasis on the MRI. -I also reviewed her K-ras mutation results with her, she would not benefit from EGFR targeted therapy -Patient was very discouraged by the high risk of the surgery and negative impression from the consultation. She is willing to consider surgery, however requests to see a different surgeon. I personally contacted Dr. Clovis Riley at [facility] today.  -Lab reviewed, adequate for treatment, proceed cycle 8 FOLFOX today -We discussed to add on Avastin if she decides not to pursue surgery -I have requested her tumor to be tested for care arrest and at rest to see if she is a candidate for EGFR inhibitor.  2. PE -She was on Lovenox injection for a month then switched to oral anticoagulant Coumadin.  -Follow-up with Coumadin clinic.   3. Right pelvic cyst on CT 11/14/2014 -This has resolved on the recent CT scan.  3. right ureter stent placement -She will follow up with her urologist.  -her urinary symptoms likely related to the stent, previous multiple culture were negative. - okay to take Tylenol for pain.  5. 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 Her iron study is consistent with iron deficient anemia -This is secondary to the bleeding from her colon cancer. -She received IV Feraheme $RemoveBef'510mg'JFuspWuRXA$  twice.  -Repeat her on study on next visit  6.G1 peripheral neuropathy, fatigue  -Secondary to chemotherapy, improved  -Continue close monitoring  Plan, -Cycle 8 FOLFOX today, with Neulasta on day 3 -I contact Dr. Clovis Riley at [facility], to see if he is able to see her in August -Return to clinic in 2 weeks with lab, including CBC, CMP, ferritin and iron study   All questions were answered. The patient knows to call the clinic with any problems, questions or concerns.  I spent  20 minutes counseling the patient face to face. The total time spent in the appointment was 25 minutes and more than 50% was on counseling.     Truitt Merle, MD 05/16/2015 5:11 PM

## 2015-05-16 NOTE — Patient Instructions (Signed)

## 2015-05-17 LAB — CEA: CEA: 2 ng/mL (ref 0.0–5.0)

## 2015-05-18 ENCOUNTER — Ambulatory Visit (HOSPITAL_BASED_OUTPATIENT_CLINIC_OR_DEPARTMENT_OTHER): Payer: BLUE CROSS/BLUE SHIELD

## 2015-05-18 VITALS — BP 105/57 | HR 73 | Temp 99.0°F | Resp 16

## 2015-05-18 DIAGNOSIS — C187 Malignant neoplasm of sigmoid colon: Secondary | ICD-10-CM

## 2015-05-18 DIAGNOSIS — C182 Malignant neoplasm of ascending colon: Secondary | ICD-10-CM | POA: Diagnosis not present

## 2015-05-18 DIAGNOSIS — Z5189 Encounter for other specified aftercare: Secondary | ICD-10-CM | POA: Diagnosis not present

## 2015-05-18 DIAGNOSIS — C180 Malignant neoplasm of cecum: Secondary | ICD-10-CM | POA: Diagnosis not present

## 2015-05-18 DIAGNOSIS — C19 Malignant neoplasm of rectosigmoid junction: Secondary | ICD-10-CM

## 2015-05-18 MED ORDER — SODIUM CHLORIDE 0.9 % IJ SOLN
10.0000 mL | INTRAMUSCULAR | Status: DC | PRN
  Administered 2015-05-18: 10 mL
  Filled 2015-05-18: qty 10

## 2015-05-18 MED ORDER — HEPARIN SOD (PORK) LOCK FLUSH 100 UNIT/ML IV SOLN
500.0000 [IU] | Freq: Once | INTRAVENOUS | Status: AC | PRN
  Administered 2015-05-18: 500 [IU]
  Filled 2015-05-18: qty 5

## 2015-05-18 MED ORDER — PEGFILGRASTIM INJECTION 6 MG/0.6ML ~~LOC~~
6.0000 mg | PREFILLED_SYRINGE | Freq: Once | SUBCUTANEOUS | Status: AC
  Administered 2015-05-18: 6 mg via SUBCUTANEOUS
  Filled 2015-05-18: qty 0.6

## 2015-05-18 NOTE — Patient Instructions (Signed)
Pegfilgrastim injection What is this medicine? PEGFILGRASTIM (peg fil GRA stim) is a long-acting granulocyte colony-stimulating factor that stimulates the growth of neutrophils, a type of white blood cell important in the body's fight against infection. It is used to reduce the incidence of fever and infection in patients with certain types of cancer who are receiving chemotherapy that affects the bone marrow. This medicine may be used for other purposes; ask your health care provider or pharmacist if you have questions. COMMON BRAND NAME(S): Neulasta What should I tell my health care provider before I take this medicine? They need to know if you have any of these conditions: -latex allergy -ongoing radiation therapy -sickle cell disease -skin reactions to acrylic adhesives (On-Body Injector only) -an unusual or allergic reaction to pegfilgrastim, filgrastim, other medicines, foods, dyes, or preservatives -pregnant or trying to get pregnant -breast-feeding How should I use this medicine? This medicine is for injection under the skin. If you get this medicine at home, you will be taught how to prepare and give the pre-filled syringe or how to use the On-body Injector. Refer to the patient Instructions for Use for detailed instructions. Use exactly as directed. Take your medicine at regular intervals. Do not take your medicine more often than directed. It is important that you put your used needles and syringes in a special sharps container. Do not put them in a trash can. If you do not have a sharps container, call your pharmacist or healthcare provider to get one. Talk to your pediatrician regarding the use of this medicine in children. Special care may be needed. Overdosage: If you think you have taken too much of this medicine contact a poison control center or emergency room at once. NOTE: This medicine is only for you. Do not share this medicine with others. What if I miss a dose? It is  important not to miss your dose. Call your doctor or health care professional if you miss your dose. If you miss a dose due to an On-body Injector failure or leakage, a new dose should be administered as soon as possible using a single prefilled syringe for manual use. What may interact with this medicine? Interactions have not been studied. Give your health care provider a list of all the medicines, herbs, non-prescription drugs, or dietary supplements you use. Also tell them if you smoke, drink alcohol, or use illegal drugs. Some items may interact with your medicine. This list may not describe all possible interactions. Give your health care provider a list of all the medicines, herbs, non-prescription drugs, or dietary supplements you use. Also tell them if you smoke, drink alcohol, or use illegal drugs. Some items may interact with your medicine. What should I watch for while using this medicine? You may need blood work done while you are taking this medicine. If you are going to need a MRI, CT scan, or other procedure, tell your doctor that you are using this medicine (On-Body Injector only). What side effects may I notice from receiving this medicine? Side effects that you should report to your doctor or health care professional as soon as possible: -allergic reactions like skin rash, itching or hives, swelling of the face, lips, or tongue -dizziness -fever -pain, redness, or irritation at site where injected -pinpoint red spots on the skin -shortness of breath or breathing problems -stomach or side pain, or pain at the shoulder -swelling -tiredness -trouble passing urine Side effects that usually do not require medical attention (report to your doctor   or health care professional if they continue or are bothersome): -bone pain -muscle pain This list may not describe all possible side effects. Call your doctor for medical advice about side effects. You may report side effects to FDA at  1-800-FDA-1088. Where should I keep my medicine? Keep out of the reach of children. Store pre-filled syringes in a refrigerator between 2 and 8 degrees C (36 and 46 degrees F). Do not freeze. Keep in carton to protect from light. Throw away this medicine if it is left out of the refrigerator for more than 48 hours. Throw away any unused medicine after the expiration date. NOTE: This sheet is a summary. It may not cover all possible information. If you have questions about this medicine, talk to your doctor, pharmacist, or health care provider.  2015, [employer]vier/Gold Standard. (2014-01-19 16:14:05)  

## 2015-05-22 ENCOUNTER — Encounter: Payer: Self-pay | Admitting: Hematology

## 2015-05-24 ENCOUNTER — Ambulatory Visit: Payer: BLUE CROSS/BLUE SHIELD

## 2015-05-24 ENCOUNTER — Ambulatory Visit: Payer: BLUE CROSS/BLUE SHIELD | Admitting: Hematology

## 2015-05-24 ENCOUNTER — Other Ambulatory Visit: Payer: Self-pay | Admitting: Hematology

## 2015-05-24 ENCOUNTER — Other Ambulatory Visit: Payer: BLUE CROSS/BLUE SHIELD

## 2015-05-24 DIAGNOSIS — C19 Malignant neoplasm of rectosigmoid junction: Secondary | ICD-10-CM

## 2015-05-25 ENCOUNTER — Telehealth: Payer: Self-pay | Admitting: Hematology

## 2015-05-25 NOTE — Telephone Encounter (Signed)
per pof to sch pt w/surgeon @ [facility]-sent to [place] in HIM auth refferral

## 2015-05-31 ENCOUNTER — Telehealth: Payer: Self-pay | Admitting: Hematology

## 2015-05-31 ENCOUNTER — Telehealth: Payer: Self-pay | Admitting: *Deleted

## 2015-05-31 ENCOUNTER — Other Ambulatory Visit: Payer: Self-pay | Admitting: *Deleted

## 2015-05-31 ENCOUNTER — Ambulatory Visit: Payer: BLUE CROSS/BLUE SHIELD

## 2015-05-31 ENCOUNTER — Ambulatory Visit (HOSPITAL_BASED_OUTPATIENT_CLINIC_OR_DEPARTMENT_OTHER): Payer: BLUE CROSS/BLUE SHIELD

## 2015-05-31 ENCOUNTER — Other Ambulatory Visit (HOSPITAL_BASED_OUTPATIENT_CLINIC_OR_DEPARTMENT_OTHER): Payer: BLUE CROSS/BLUE SHIELD

## 2015-05-31 ENCOUNTER — Ambulatory Visit: Payer: BLUE CROSS/BLUE SHIELD | Admitting: Pharmacist

## 2015-05-31 ENCOUNTER — Ambulatory Visit (HOSPITAL_BASED_OUTPATIENT_CLINIC_OR_DEPARTMENT_OTHER): Payer: BLUE CROSS/BLUE SHIELD | Admitting: Hematology

## 2015-05-31 ENCOUNTER — Encounter: Payer: Self-pay | Admitting: Hematology

## 2015-05-31 VITALS — BP 154/73 | HR 86 | Temp 98.5°F | Resp 18 | Ht 68.0 in | Wt 205.7 lb

## 2015-05-31 DIAGNOSIS — C19 Malignant neoplasm of rectosigmoid junction: Secondary | ICD-10-CM

## 2015-05-31 DIAGNOSIS — C182 Malignant neoplasm of ascending colon: Secondary | ICD-10-CM

## 2015-05-31 DIAGNOSIS — C187 Malignant neoplasm of sigmoid colon: Secondary | ICD-10-CM

## 2015-05-31 DIAGNOSIS — C180 Malignant neoplasm of cecum: Secondary | ICD-10-CM

## 2015-05-31 DIAGNOSIS — C189 Malignant neoplasm of colon, unspecified: Secondary | ICD-10-CM

## 2015-05-31 DIAGNOSIS — D509 Iron deficiency anemia, unspecified: Secondary | ICD-10-CM

## 2015-05-31 DIAGNOSIS — C787 Secondary malignant neoplasm of liver and intrahepatic bile duct: Secondary | ICD-10-CM

## 2015-05-31 DIAGNOSIS — I2699 Other pulmonary embolism without acute cor pulmonale: Secondary | ICD-10-CM

## 2015-05-31 DIAGNOSIS — R5383 Other fatigue: Secondary | ICD-10-CM

## 2015-05-31 DIAGNOSIS — Z5111 Encounter for antineoplastic chemotherapy: Secondary | ICD-10-CM | POA: Diagnosis not present

## 2015-05-31 DIAGNOSIS — Z95828 Presence of other vascular implants and grafts: Secondary | ICD-10-CM

## 2015-05-31 DIAGNOSIS — G620 Drug-induced polyneuropathy: Secondary | ICD-10-CM

## 2015-05-31 LAB — IRON AND TIBC CHCC
%SAT: 12 % — AB (ref 21–57)
Iron: 49 ug/dL (ref 41–142)
TIBC: 428 ug/dL (ref 236–444)
UIBC: 378 ug/dL (ref 120–384)

## 2015-05-31 LAB — COMPREHENSIVE METABOLIC PANEL (CC13)
ALT: 31 U/L (ref 0–55)
ANION GAP: 8 meq/L (ref 3–11)
AST: 47 U/L — AB (ref 5–34)
Albumin: 3 g/dL — ABNORMAL LOW (ref 3.5–5.0)
Alkaline Phosphatase: 230 U/L — ABNORMAL HIGH (ref 40–150)
BUN: 7.5 mg/dL (ref 7.0–26.0)
CALCIUM: 8.6 mg/dL (ref 8.4–10.4)
CHLORIDE: 110 meq/L — AB (ref 98–109)
CO2: 25 mEq/L (ref 22–29)
Creatinine: 0.9 mg/dL (ref 0.6–1.1)
EGFR: 75 mL/min/{1.73_m2} — AB (ref >90)
GLUCOSE: 126 mg/dL (ref 70–140)
Potassium: 3.6 mEq/L (ref 3.5–5.1)
Sodium: 143 mEq/L (ref 136–145)
Total Bilirubin: 0.4 mg/dL (ref 0.20–1.20)
Total Protein: 6 g/dL — ABNORMAL LOW (ref 6.4–8.3)

## 2015-05-31 LAB — CBC WITH DIFFERENTIAL/PLATELET
BASO%: 0.3 % (ref 0.0–2.0)
BASOS ABS: 0 10*3/uL (ref 0.0–0.1)
EOS ABS: 0 10*3/uL (ref 0.0–0.5)
EOS%: 0.6 % (ref 0.0–7.0)
HEMATOCRIT: 25.7 % — AB (ref 34.8–46.6)
HGB: 8.1 g/dL — ABNORMAL LOW (ref 11.6–15.9)
LYMPH%: 8.9 % — ABNORMAL LOW (ref 14.0–49.7)
MCH: 25.8 pg (ref 25.1–34.0)
MCHC: 31.6 g/dL (ref 31.5–36.0)
MCV: 81.6 fL (ref 79.5–101.0)
MONO#: 0.7 10*3/uL (ref 0.1–0.9)
MONO%: 10.9 % (ref 0.0–14.0)
NEUT%: 79.3 % — ABNORMAL HIGH (ref 38.4–76.8)
NEUTROS ABS: 4.9 10*3/uL (ref 1.5–6.5)
Platelets: 155 10*3/uL (ref 145–400)
RBC: 3.15 10*6/uL — AB (ref 3.70–5.45)
RDW: 24.8 % — AB (ref 11.2–14.5)
WBC: 6.2 10*3/uL (ref 3.9–10.3)
lymph#: 0.6 10*3/uL — ABNORMAL LOW (ref 0.9–3.3)

## 2015-05-31 LAB — PROTIME-INR
INR: 3.2 (ref 2.00–3.50)
Protime: 38.4 Seconds — ABNORMAL HIGH (ref 10.6–13.4)

## 2015-05-31 LAB — POCT INR: INR: 3.2

## 2015-05-31 LAB — CEA: CEA: 2.5 ng/mL (ref 0.0–5.0)

## 2015-05-31 LAB — FERRITIN CHCC: Ferritin: 111 ng/ml (ref 9–269)

## 2015-05-31 MED ORDER — ZOLPIDEM TARTRATE 5 MG PO TABS: 5.0000 mg | ORAL_TABLET | Freq: Every evening | ORAL | Status: AC | PRN

## 2015-05-31 MED ORDER — OXALIPLATIN CHEMO INJECTION 100 MG/20ML
85.0000 mg/m2 | Freq: Once | INTRAVENOUS | Status: AC
  Administered 2015-05-31: 185 mg via INTRAVENOUS
  Filled 2015-05-31: qty 37

## 2015-05-31 MED ORDER — SODIUM CHLORIDE 0.9 % IJ SOLN
10.0000 mL | INTRAMUSCULAR | Status: DC | PRN
  Administered 2015-05-31: 10 mL via INTRAVENOUS
  Filled 2015-05-31: qty 10

## 2015-05-31 MED ORDER — ZOLPIDEM TARTRATE 5 MG PO TABS: 5.0000 mg | ORAL_TABLET | Freq: Every evening | ORAL | Status: DC | PRN

## 2015-05-31 MED ORDER — SODIUM CHLORIDE 0.9 % IV SOLN
2400.0000 mg/m2 | INTRAVENOUS | Status: DC
  Administered 2015-05-31: 5150 mg via INTRAVENOUS
  Filled 2015-05-31: qty 103

## 2015-05-31 MED ORDER — SODIUM CHLORIDE 0.9 % IV SOLN
Freq: Once | INTRAVENOUS | Status: AC
  Administered 2015-05-31: 13:00:00 via INTRAVENOUS
  Filled 2015-05-31: qty 4

## 2015-05-31 MED ORDER — LEUCOVORIN CALCIUM INJECTION 350 MG
400.0000 mg/m2 | Freq: Once | INTRAVENOUS | Status: AC
  Administered 2015-05-31: 860 mg via INTRAVENOUS
  Filled 2015-05-31: qty 43

## 2015-05-31 MED ORDER — NYSTATIN 100000 UNIT/ML MT SUSP: 5.0000 mL | Freq: Four times a day (QID) | OROMUCOSAL | Status: DC | PRN

## 2015-05-31 MED ORDER — DEXTROSE 5 % IV SOLN
Freq: Once | INTRAVENOUS | Status: AC
  Administered 2015-05-31: 12:00:00 via INTRAVENOUS

## 2015-05-31 MED ORDER — FLUOROURACIL CHEMO INJECTION 2.5 GM/50ML
400.0000 mg/m2 | Freq: Once | INTRAVENOUS | Status: AC
  Administered 2015-05-31: 850 mg via INTRAVENOUS
  Filled 2015-05-31: qty 17

## 2015-05-31 NOTE — Progress Notes (Signed)
[name]  [phone] [phone] [place] Note   Patient Care Team: [name], MD as PCP - General (Family Medicine) Alexis Frock, MD as Consulting Physician (Urology)  CHIEF COMPLAINTS:  Follow-up colon cancer  Oncology History   Next week.Colorectal cancer, stage III   Staging form: Neuroendocrine Tumor - Colon/Rectum, AJCC 7th Edition     Clinical: Stage Unknown (T4, NX, M0) - Unsigned     Pathologic: No stage assigned - Unsigned       Colorectal cancer, stage III   10/04/2014 Tumor Marker CEA 1.0 .  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 mpT4aN2aM0,stage IIIC.    10/10/2014 Surgery partial right hemicolectomy with removal of terminal ileum, sigmoid colectomy, right ureter lysis, (+) residual surgical margin    11/23/2014 - 01/06/2015 Chemotherapy FOLFOX every 2 weeks for 4 cycles    01/23/2015 - 03/02/2015 Radiation Therapy adjuvant radiation   03/27/2015 Progression CT chest, abdomen and pelvis showed new capsular nodules along the posterior margin of hepatic segment 6, measuring 2.1X1.1cm, and then you omental nodule, suspicious for new metastasis.   03/29/2015 -  Chemotherapy Restarted FOLFOX every 2 weeks   OTHER ISSUES: 1. Pulmonary embolism in segment omental branches of the right lung, diagnosed on 11/28/2014 2. Right ureter stent placed on 10/20/2014  CURRENT THERAPY: FOLFOX every 2 weeks, started on 11/23/2014, restarted on 03/29/2015 after radiation   HISTORY OF PRESENTING ILLNESS:  Allison Mitchell 54 y.o. female is here for follow-up after her hospital discharge. She was recently diagnosed with 3 synchronous to colon cancer, status post surgical resection. I initially saw her when she was diagnosed in the hospital.  She presented with some GI  symptoms since last October 2014 at which time she was diagnosed with ischemic colitis. Due to guaiac-positive stools, patient had undergone an outpatient colonoscopy on 09/26/2014, which showed inflamed and ulcerated mucosa in the terminal ileum, and a 1.5 cm mass in the ascending colon and additional 1.5 cm mass in the mid sigmoid colon. All biopsy from the above 3 sites came back positive for invasive moderate differentiated adenocarcinoma, and the sigmoid mass containing squamous cell differentiation.   She was hospitalized for worsening of her symptoms on 10/03/2014.  A CT of the abdomen and pelvis with contrast on 10/02/2014 was remarkable for soft tissue/bowel wall thickening of the right lower pelvis center at the cecum-terminal ileum involving a portion of the adjacent sigmoid colon. This causes small bowel obstruction and obstructed the distal right ureter causing moderate to severe right hydroureteronephrosis. She was transferred from [facility] to [facility] for surgical management and further testing. CT of the chest with contrast on 10/03/2014 negative for metastatic disease. MRI of the liver without and without contrast on 10/03/2014 is remarkable for small hepatic cysts, but no findings suspicious for hepatic metastatic disease. Right-sided hydroureteronephrosis was once again seen, with early small bowel obstruction bowel gas pattern.  She underwent right hemicolectomy with primary anastomosis with sigmoid colectomy with end colostomy on Friday, Dec 5, after cystoscopy is performed on 12/4 to rule out GU invasion and a ureteral stent placement.  She has family history of colon cancer in her grandmother and polyps in her sister. Denies risk factors for HIV or hepatitis. Denies Diabetes. No Tobacco. No ETOH. Denies prior radiation. SHe did consume  significant amount of red meat until recently. We are asked to see the patient in consultation, with recommendations on the oncology  standpoint  She is recovering slowly after the surgery. She had the surgical suture opened about 3 weeks ago and was treated for antibiotics , and now much improved. She has no significant pain, but mild abdominal discomfort, no nausea, her stool is getting thicker, she clean her colostomy bag daily. She was seen by Dr. Hulen Skains this morning. She noticed some cloudy urine in the past few days, with mild burning sensation, no fever or back pain. Her appetite is getting better, eats well.  She lost about 25 lbs. No fever or chills. No other new complains.   INTERIM HISTORY: Allison Mitchell returns for follow-up and cycle 9 chemotherapy. She is doing well overall. She has mild sensitivity in her mouth, no significant pain no difficulty swallowing. No nausea or vomiting. 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 No significant pain, diarrhea or other complaints no fever or chills.  MEDICAL HISTORY:  Past Medical History  Diagnosis Date  . GERD (gastroesophageal reflux disease)   . Depression   . Anxiety   . 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 Peripheral neuropathy due to chemotherapy   . Hydronephrosis, right     malignant--  s/p reimiplant right ureter  . History of pulmonary embolus (PE)     11-28-2014--  due to chemotherapy on coumadin  . Anticoagulated on Coumadin   . Colorectal cancer, stage III oncologist--  dr Stefani Dama    Dx  10-10-2014---Stage IIIC, mpT4aN2aM0, Multifocal synchronous, Moderate differentiated Invasive, Mets to nodes (5 out of 17)/  s/p  Resection tumor cecum, terminal ileum, sigmoid/  currently chemoradiation therapy  . Dysuria   . S/P radiation therapy 03/02/15    completed pelvis/abd/50.4Gy    SURGICAL HISTORY: Past Surgical History  Procedure Laterality Date  . Cystoscopy with retrograde pyelogram, ureteroscopy and stent placement Right 10/05/2014    Procedure: [place],  URETEROSCOPY  AND STENT PLACEMENT;  Surgeon: Alexis Frock, MD;  Location: [facility];  Service: Urology;  Laterality: Right;  . Laparotomy N/A 10/10/2014    Procedure: EXPLORATORY LAPAROTOMY;  Surgeon: Doreen Salvage, MD;  Location: [place];  Service: General;  Laterality: N/A;  . Partial colectomy  10/10/2014    Procedure: PARTIAL SIGMOID COLECTOMY;  Surgeon: Doreen Salvage, MD;  Location: [place];  Service: General;;  . Colostomy  10/10/2014    Procedure: COLOSTOMY;  Surgeon: Doreen Salvage, MD;  Location: [place];  Service: General;;  . Colostomy revision  10/10/2014    Procedure: PARTIAL RIGHT COLECTOMY;  Surgeon: Doreen Salvage, MD;  Location: [place];  Service: General;;  . Ureteral reimplantion Right 10/10/2014    Procedure: OPEN RIGHT URETERAL LYSIS ;  Surgeon: Alexis Frock, MD;  Location: [place];  Service: Urology;  Laterality: Right;  . Portacath placement N/A 10/17/2014    Procedure: INSERTION PORT-A-CATH LEFT SUBCLAVIAN AND MIDLINE INCISION STAPLE REMOVAL ;  Surgeon: Coralie Keens, MD;  Location: [place];  Service: General;  Laterality: N/A;  . Cystoscopy w/ retrogrades Right 01/10/2015    Procedure: CYSTOSCOPY WITH RETROGRADE PYELOGRAM;  Surgeon: Alexis Frock, MD;  Location: [facility];  Service: Urology;  Laterality: Right;  . Cystoscopy w/ ureteral stent placement Right 01/10/2015    Procedure: CYSTOSCOPY WITH STENT REPLACEMENT;  Surgeon: Alexis Frock, MD;  Location: [facility];  Service: Urology;  Laterality: Right;    SOCIAL HISTORY: History   Social History  . Marital Status: Married    Spouse Name: N/A  . Number of Children: N/A  . Years of Education: N/A   Occupational History  . Not on file.   Social History Main Topics  . Smoking status: Never Smoker   . Smokeless tobacco: Never Used  . Alcohol Use: No  . Drug Use: No  . Sexual Activity: No   Other Topics Concern  . 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 Cancer Mother     skin cancer   . Diabetes Father   . CAD Father     Onset in his 34s, has 7 stents  . Cancer Maternal Grandmother 29    colon cancer   . Cancer Paternal Grandmother 26    breast cancer     ALLERGIES:  is allergic to amoxicillin.  MEDICATIONS:  Current Outpatient Prescriptions  Medication Sig Dispense Refill  . citalopram (CELEXA) 20 MG tablet Take 20-40 mg by mouth every morning.   1  . lidocaine-prilocaine (EMLA) cream Apply 1 application topically as needed. 30 g 0  . ondansetron (ZOFRAN) 8 MG tablet Take 1 tablet (8 mg total) by mouth every 8 (eight) hours as needed for nausea or vomiting. 20 tablet 3  . oxyCODONE (OXY IR/ROXICODONE) 5 MG immediate release tablet Take 1 tablet (5 mg total) by mouth every 6 (six) hours as needed for severe pain. 30 tablet 0  . PREVIDENT 5000 BOOSTER PLUS 1.1 % PSTE 1 each by Other route daily.   5  . warfarin (COUMADIN) 5 MG tablet Take 1 tablet (5 mg total) by mouth daily. Take one tablet (63m) on MWF and half a tablet (2.579m on all other days. 30 tablet 3  . zolpidem (AMBIEN) 5 MG tablet Take 1 tablet (5 mg total) by mouth at bedtime as needed for sleep. 30 tablet 0  . magnesium hydroxide (MILK OF MAGNESIA) 400 MG/5ML suspension Take 5 mLs by mouth daily as needed for mild constipation.    . potassium chloride SA (K-DUR,KLOR-CON) 20 MEQ tablet Take 1 tablet (20 mEq total) by mouth daily. (Patient not taking: Reported on 05/31/2015) 14 tablet 1   No current facility-administered medications for this visit.   Facility-Administered Medications Ordered in Other Visits  Medication Dose Route Frequency Provider Last Rate Last Dose  . sodium chloride 0.9 % injection 10 mL  10 mL Intracatheter PRN YaTruitt MerleMD      . sodium chloride 0.9 % injection 10 mL  10 mL Intravenous PRN YaTruitt MerleMD   10 mL at  05/31/15 0953    REVIEW OF SYSTEMS:   Constitutional: Denies fevers, chills or abnormal night sweats Eyes: Denies blurriness of vision, double vision or watery eyes Ears, nose, mouth, throat, and face: Denies mucositis or sore throat Respiratory: Denies cough, dyspnea or wheezes Cardiovascular: Denies palpitation, chest discomfort or lower extremity swelling Gastrointestinal:  Denies nausea, heartburn or change in bowel habits Skin: Denies abnormal skin rashes Lymphatics: Denies new lymphadenopathy or easy bruising Neurological:Denies numbness, tingling or new weaknesses Behavioral/Psych: Mood is stable, no new changes  All other systems were reviewed with the patient and are negative.  PHYSICAL  EXAMINATION: ECOG PERFORMANCE STATUS: 1 - Symptomatic but completely ambulatory  Filed Vitals:   05/31/15 1038  BP: 154/73  Pulse: 86  Temp: 98.5 F (36.9 C)  Resp: 18   Filed Weights   05/31/15 1038  Weight: 205 lb 11.2 oz (93.305 kg)    GENERAL:alert, no distress and comfortable SKIN: skin color, texture, turgor are normal, no rashes or significant lesions EYES: normal, conjunctiva are pink and non-injected, sclera clear OROPHARYNX:no exudate, no erythema and lips, buccal mucosa, and tongue normal  NECK: supple, thyroid normal size, non-tender, without nodularity LYMPH:  no palpable lymphadenopathy in the cervical, axillary or inguinal LUNGS: clear to auscultation and percussion with normal breathing effort HEART: regular rate & rhythm and no murmurs and no lower extremity edema ABDOMEN:abdomen soft, non-tender and normal bowel sounds. Positive for colostomy bag on the left, with normal-appearing stool. Surgical wound upper part is well-healed. Musculoskeletal:no cyanosis of digits and no clubbing  PSYCH: alert & oriented x 3 with fluent speech NEURO: no focal motor/sensory deficits  LABORATORY DATA:  CBC Latest Ref Rng 05/31/2015 05/16/2015 05/10/2015  WBC 3.9 - 10.3 10e3/uL 6.2  2.7(L) 1.9(L)  Hemoglobin 11.6 - 15.9 g/dL 8.1(L) 7.8(L) 7.6(L)  Hematocrit 34.8 - 46.6 % 25.7(L) 25.0(L) 24.1(L)  Platelets 145 - 400 10e3/uL 155 163 100(L)    CMP Latest Ref Rng 05/31/2015 05/16/2015 05/10/2015  Glucose 70 - 140 mg/dl 126 120 149(H)  BUN 7.0 - 26.0 mg/dL 7.5 6.5(L) 6.9(L)  Creatinine 0.6 - 1.1 mg/dL 0.9 0.9 0.8  Sodium 136 - 145 mEq/L 143 141 140  Potassium 3.5 - 5.1 mEq/L 3.6 3.6 3.4(L)  Chloride 96 - 112 mmol/L - - -  CO2 22 - 29 mEq/L _0 Calcium 8.4 - 10.4 mg/dL 8.6 8.9 8.8  Total Protein 6.4 - 8.3 g/dL 6.0(L) 6.1(L) 5.8(L)  Total Bilirubin 0.20 - 1.20 mg/dL 0.40 0.47 0.62  Alkaline Phos 40 - 150 U/L 230(H) 191(H) 200(H)  AST 5 - 34 U/L 47(H) 30 55(H)  ALT 0 - 55 U/L 31 26 53   PATHOLOGY REPORTS:  Surgical path 10/10/2014 1. Colon, segmental resection for tumor, Cecum, terminal ileum, sigmoid colon - MULTIFOCAL INVASIVE ADENOCARCINOMA, MODERATELY DIFFERENTIATED, THE LARGEST FOCUS SPANS 4.0 CM. - ADENOCARCINOMA INVOLVES SEROSA. - LYMPHOVASCULAR INVASION IS IDENTIFIED. - METASTATIC CARCINOMA IN 5 OF 17 LYMPH NODES (5/17). - THE PROXIMAL AND DISTAL SURGICAL RESECTION MARGINS ARE NEGATIVE FOR ADENOCARCINOMA. - SEE ONCOLOGY TABLE BELOW. 2. Soft tissue, biopsy, Right periureteral tissue - ADENOCARCINOMA. 3. Soft tissue, biopsy, Right periureteral tissue - INFLAMED AND DEGENERATING FIBROADIPOSE TISSUE. - THERE IS NO EVIDENCE OF MALIGNANCY. Microscopic Comment 1. COLON AND RECTUM (INCLUDING TRANS-ANAL RESECTION): Specimen: Distal ileum, cecum, proximal ascending colon, and adherent sigmoid colon. Procedure: Resection. Tumor site: Multiple foci, including ileocecal valve, ascending colon and sigmoid. Specimen integrity: Disrupted. Macroscopic intactness of mesorectum: N/A Macroscopic tumor perforation: Present. Invasive tumor: Maximum size: 4.0 cm. Histologic type(s): Adenocarcinoma. Histologic grade and differentiation: G2: moderately  differentiated Type of polyp in which invasive carcinoma arose: Tubulovillous adenomas. Microscopic extension of invasive tumor: Focally involves the serosa. Lymph-Vascular invasion: Present. Peri-neural invasion: Not identified. Tumor deposit(s) (discontinuous extramural extension): Not identified. Resection margins: Proximal margin: 4.0 cm Distal margin: 3.5 cm Circumferential (radial) (posterior ascending, posterior descending; lateral and posterior mid-rectum; and entire lower 1/3 rectum): Cannot be assessed. Mesenteric margin (sigmoid and transverse): Cannot be assessed. Treatment effect (neo-adjuvant therapy): N/A Additional polyp(s): Not identified. Non-neoplastic findings: No significant findings. Lymph nodes: number examined 17;  number positive: 5 Pathologic Staging: mpT4a, pN2a, pMX Ancillary studies: A block will be sent for MSI testing by [facility] and MSI testing by PCR and the results reported separately. Comment: There appear to be three synchronous primary tumors in the specimen, one at the ileocecal valve, one in the ascending colon, and one at the sigmoid colon. Each of these foci contain an associated tubulovillous adenoma, suggesting three separate primary sites. The tumor in the sigmoid colon appears to involve the serosa as well. The other two tumors show extension into at least the pericolonic soft tissue. Dr Mali Rund has reviewed selected slides and concurs that there are likely three synchronous tumors here. The case was discussed with Dr Hulen Skains. Per Dr Hulen Skains, the specimen in #2 is a contiguous piece of tissue 2 of 4 Amended copy Amended FINAL for Allison Mitchell, SHARTZER [phone]) Microscopic Comment(continued) with the main tumor, and therefore, not considered a distant metastatic focus. Therefore, the tumor is staged as an MX. (JBK:ecj 10/16/2014) JOSHUA  Mismatch Repair (MMR) Protein Immunohistochemistry (IHC) IHC Expression Result: MLH1: Preserved nuclear  expression (greater 50% tumor expression) MSH2: Preserved nuclear expression (greater 50% tumor expression) MSH6: Preserved nuclear expression (greater 50% tumor expression) PMS2: Preserved nuclear expression (greater 50% tumor expression) * Internal control demonstrates intact nuclear expression Interpretation: NORMAL There is preserved expression  Diagnosis 04/16/2015 Liver, needle/core biopsy - METASTATIC ADENOCARCINOMA, SEE COMMENT. Microscopic Comment There are well formed malignant glands with mucin. The morphology combined with the patient's history are consistent with metastatic colorectal carcinoma. Dr. Burr Medico was notified on 04/17/2015. Per request, tissue will be sent for RAS testing.     RADIOGRAPHIC STUDIES: I have personally reviewed the radiological images as listed and agreed with the findings in the report.  CT of chest, abdomen and pelvis 03/27/2015  IMPRESSION: 1. Suspicion for new peritoneal metastatic disease including a 2.1 by 1.1 cm capsular nodule along the posterior margin of hepatic segment 6, and a new small right anterior omental nodule. 2. Several additional hypodense tiny lesions in the liver are stable. 3. Moderate right hydronephrosis and hydroureter with periureteral stranding, despite the presence of the double-J ureteral stent. However, there is no asymmetry in enhancement or excretion of the kidneys. 4. Prominence of stool in the colon extending to the colostomy site. 5. 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  Abdominal MRI with and without contrast 05/03/2015 IMPRESSION: Irregular peripheral enhancement of the new lesion seen along the posterior capsule of the right liver is highly suspicious for metastatic involvement in this patient with a history of  colon cancer. This lesion may be amenable to percutaneous tissue sampling, if clinically warranted.   ASSESSMENT & PLAN:  54 year old Caucasian female with minimal past medical history, who was found to have 3 multifocal colon cancer at terminal ileum/cecum, ascending colon and sigmoid colon. All of them has adenocarcinoma,  And the cecum tumor has squamous differentiation. The cecum tumor has directly invaded the soft tissue along the right ureter, which was not completely resected (1% tumor left over, per Dr. Hulen Skains). She has mpT4aN2aM0 stage IIIB, MSI/MMR normal.   1. Multifocal stage IIIB colon cancer, MMR normal, MSI stable, biopsy confirmed liver and probable peritoneal metastases, KRAS mutated  -I discussed her surgical path findings extensively with patient and her husband. Unfortunately, the tumor was not completely resected. She has very small amount of  tumor left over. -Given her residual tumor after surgery, advanced stage III disease, high risk recurrence in the future,  we recommended sequential adjuvant chemotherapy and irradiation -Her restaging scan after radiation unfortunately showed  probable peritoneal metastasis in liver and omentum. Liver biopsy confirmed metastasis.  -I reviewed with the abdominal MRI findings with her. The biopsied liver metastases is stable, there is additional 5 mm lesions on MRI, no significant peritoneal metastasis on the MRI. -I also reviewed her K-ras mutation results with her, she would not benefit from EGFR targeted therapy -She was referred and seen by Dr. Johney Maine to consider surgical debulking and HIPEC, and she will see Dr. Clovis Riley for follow up on 9/7 and possible surgery. I have personally communicated with Dr. Clovis Riley.  -Lab reviewed, adequate for treatment, proceed cycle 9 FOLFOX today -We discussed to add on Avastin if she decides not to pursue surgery -No benefit of EGFR inhibitor giving KRAS mutation (+)   2. PE  -She was on Lovenox  injection for a month then switched to oral anticoagulant Coumadin.  -Follow-up with Coumadin clinic.  -Continue Coumadin indefinitely giving the underlying malignancy  3. right ureter stent placement -She will follow up with her urologist Dr. Tresa Moore   -her urinary symptoms likely related to the stent, previous multiple culture were negative. - she is scheduled to have the stent taken out on September 13, I suggest her to discuss with Dr. Tresa Moore again due to the pending surgery in September or October   5. 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 Her iron study is consistent with iron deficient anemia -This is secondary to the bleeding from her colon cancer. -She received IV Feraheme 560m twice.  -Repeated iron study today   6.G1 peripheral neuropathy, fatigue  -Secondary to chemotherapy, improved  -Continue close monitoring  Plan, -Cycle 9 FOLFOX today, with Neulasta on day 3 -follow up with Dr. LClovis Rileyon 9/7  -follow up with Dr. MTresa Mooreregarding her stent  -RTC in 2 weeks for cycle 9   All questions were answered. The patient knows to call the clinic with any problems, questions or concerns.  I spent 20 minutes counseling the patient face to face. The total time spent in the appointment was 25 minutes and more than 50% was on counseling.     FTruitt Merle MD 05/31/2015 11:32 AM

## 2015-05-31 NOTE — Telephone Encounter (Signed)
per pof to sch pt appt-sent MW email to sch pt trmt-pt to get updated copy of sch on 7/30

## 2015-05-31 NOTE — Progress Notes (Signed)
INR slightly above goal today. INR stable. Hg/Hct: 8.1/25.7, Pltc = 155 Pt took coumadin as instructed. No missed or extra coumadin doses. No changes in diet or medications. No unusual bruising. No bleeding noted. No s/s of clotting noted. Pt may have surgery this fall. More info to come after MD visit in early September. Pt will likely be hospitalized for ~ 2 weeks after the surgery (if done). Take coumadin 2mg  today. On 06/01/15, decrease coumadin to 2.5mg  daily. Recheck INR in 2 weeks with next treatment on 06/14/15; lab at 9:45am, Flush at 9:45am, MD at 10:15am, treatment at 11am and coumadin clinic at 11:15am.  No charge encounter, pt seen in infusion area.  Coumadin 2mg  tablet sample provided to patient. ZOX:[id number], Exp: 2/17

## 2015-05-31 NOTE — Patient Instructions (Signed)
Take coumadin 2mg  today. On 06/01/15, decrease coumadin to 2.5mg  daily. Recheck INR in 2 weeks with next treatment on 06/14/15; lab at 9:45am, Flush at 9:45am, MD at 10:15am, treatment at 11am and coumadin clinic at 11:15am.

## 2015-05-31 NOTE — Patient Instructions (Signed)

## 2015-05-31 NOTE — Telephone Encounter (Signed)
Per staff message and POF I have scheduled appts. Advised scheduler of appts. JMW  

## 2015-05-31 NOTE — Telephone Encounter (Signed)
per Brandy to sch CC-pt aware °

## 2015-06-02 ENCOUNTER — Ambulatory Visit (HOSPITAL_BASED_OUTPATIENT_CLINIC_OR_DEPARTMENT_OTHER): Payer: BLUE CROSS/BLUE SHIELD

## 2015-06-02 VITALS — BP 136/68 | HR 77 | Temp 97.8°F | Resp 18

## 2015-06-02 DIAGNOSIS — C182 Malignant neoplasm of ascending colon: Secondary | ICD-10-CM

## 2015-06-02 DIAGNOSIS — Z5189 Encounter for other specified aftercare: Secondary | ICD-10-CM | POA: Diagnosis not present

## 2015-06-02 DIAGNOSIS — C180 Malignant neoplasm of cecum: Secondary | ICD-10-CM | POA: Diagnosis not present

## 2015-06-02 DIAGNOSIS — C187 Malignant neoplasm of sigmoid colon: Secondary | ICD-10-CM

## 2015-06-02 DIAGNOSIS — C19 Malignant neoplasm of rectosigmoid junction: Secondary | ICD-10-CM

## 2015-06-02 MED ORDER — SODIUM CHLORIDE 0.9 % IJ SOLN
10.0000 mL | INTRAMUSCULAR | Status: DC | PRN
  Administered 2015-06-02: 10 mL
  Filled 2015-06-02: qty 10

## 2015-06-02 MED ORDER — HEPARIN SOD (PORK) LOCK FLUSH 100 UNIT/ML IV SOLN
500.0000 [IU] | Freq: Once | INTRAVENOUS | Status: AC | PRN
  Administered 2015-06-02: 500 [IU]
  Filled 2015-06-02: qty 5

## 2015-06-02 MED ORDER — PEGFILGRASTIM INJECTION 6 MG/0.6ML ~~LOC~~
6.0000 mg | PREFILLED_SYRINGE | Freq: Once | SUBCUTANEOUS | Status: AC
  Administered 2015-06-02: 6 mg via SUBCUTANEOUS

## 2015-06-14 ENCOUNTER — Other Ambulatory Visit (HOSPITAL_BASED_OUTPATIENT_CLINIC_OR_DEPARTMENT_OTHER): Payer: BLUE CROSS/BLUE SHIELD

## 2015-06-14 ENCOUNTER — Other Ambulatory Visit: Payer: Self-pay | Admitting: *Deleted

## 2015-06-14 ENCOUNTER — Ambulatory Visit (HOSPITAL_BASED_OUTPATIENT_CLINIC_OR_DEPARTMENT_OTHER): Payer: BLUE CROSS/BLUE SHIELD

## 2015-06-14 ENCOUNTER — Telehealth: Payer: Self-pay | Admitting: *Deleted

## 2015-06-14 ENCOUNTER — Telehealth: Payer: Self-pay | Admitting: Hematology

## 2015-06-14 ENCOUNTER — Telehealth: Payer: Self-pay | Admitting: Internal Medicine

## 2015-06-14 ENCOUNTER — Encounter: Payer: Self-pay | Admitting: Hematology

## 2015-06-14 ENCOUNTER — Ambulatory Visit (HOSPITAL_COMMUNITY)
Admission: RE | Admit: 2015-06-14 | Discharge: 2015-06-14 | Disposition: A | Discharge status: Home / Self Care | Payer: BLUE CROSS/BLUE SHIELD | Source: Ambulatory Visit | Attending: Hematology | Admitting: Hematology

## 2015-06-14 ENCOUNTER — Ambulatory Visit (HOSPITAL_BASED_OUTPATIENT_CLINIC_OR_DEPARTMENT_OTHER): Payer: BLUE CROSS/BLUE SHIELD | Admitting: Pharmacist

## 2015-06-14 ENCOUNTER — Ambulatory Visit (HOSPITAL_BASED_OUTPATIENT_CLINIC_OR_DEPARTMENT_OTHER): Payer: BLUE CROSS/BLUE SHIELD | Admitting: Hematology

## 2015-06-14 ENCOUNTER — Ambulatory Visit: Payer: BLUE CROSS/BLUE SHIELD

## 2015-06-14 VITALS — BP 141/69 | HR 89 | Temp 98.4°F | Resp 18 | Ht 68.0 in | Wt 205.7 lb

## 2015-06-14 VITALS — BP 122/53 | HR 72 | Temp 98.2°F | Resp 20

## 2015-06-14 DIAGNOSIS — I2699 Other pulmonary embolism without acute cor pulmonale: Secondary | ICD-10-CM

## 2015-06-14 DIAGNOSIS — C19 Malignant neoplasm of rectosigmoid junction: Secondary | ICD-10-CM

## 2015-06-14 DIAGNOSIS — C787 Secondary malignant neoplasm of liver and intrahepatic bile duct: Secondary | ICD-10-CM | POA: Diagnosis not present

## 2015-06-14 DIAGNOSIS — C187 Malignant neoplasm of sigmoid colon: Secondary | ICD-10-CM | POA: Diagnosis not present

## 2015-06-14 DIAGNOSIS — C180 Malignant neoplasm of cecum: Secondary | ICD-10-CM | POA: Diagnosis not present

## 2015-06-14 DIAGNOSIS — C182 Malignant neoplasm of ascending colon: Secondary | ICD-10-CM | POA: Diagnosis not present

## 2015-06-14 DIAGNOSIS — D509 Iron deficiency anemia, unspecified: Secondary | ICD-10-CM | POA: Diagnosis not present

## 2015-06-14 DIAGNOSIS — R5383 Other fatigue: Secondary | ICD-10-CM

## 2015-06-14 DIAGNOSIS — C189 Malignant neoplasm of colon, unspecified: Secondary | ICD-10-CM

## 2015-06-14 DIAGNOSIS — G620 Drug-induced polyneuropathy: Secondary | ICD-10-CM

## 2015-06-14 DIAGNOSIS — K921 Melena: Secondary | ICD-10-CM

## 2015-06-14 LAB — ABO/RH: ABO/RH(D): A POS

## 2015-06-14 LAB — CBC WITH DIFFERENTIAL/PLATELET
BASO%: 0.4 % (ref 0.0–2.0)
Basophils Absolute: 0 10*3/uL (ref 0.0–0.1)
EOS%: 0.4 % (ref 0.0–7.0)
Eosinophils Absolute: 0 10*3/uL (ref 0.0–0.5)
HEMATOCRIT: 21.6 % — AB (ref 34.8–46.6)
HGB: 6.8 g/dL — CL (ref 11.6–15.9)
LYMPH%: 8.3 % — AB (ref 14.0–49.7)
MCH: 26.4 pg (ref 25.1–34.0)
MCHC: 31.6 g/dL (ref 31.5–36.0)
MCV: 83.8 fL (ref 79.5–101.0)
MONO#: 0.9 10*3/uL (ref 0.1–0.9)
MONO%: 11.5 % (ref 0.0–14.0)
NEUT#: 5.9 10*3/uL (ref 1.5–6.5)
NEUT%: 79.4 % — AB (ref 38.4–76.8)
NRBC: 2 % — AB (ref 0–0)
Platelets: 100 10*3/uL — ABNORMAL LOW (ref 145–400)
RBC: 2.58 10*6/uL — ABNORMAL LOW (ref 3.70–5.45)
RDW: 28.9 % — ABNORMAL HIGH (ref 11.2–14.5)
WBC: 7.5 10*3/uL (ref 3.9–10.3)
lymph#: 0.6 10*3/uL — ABNORMAL LOW (ref 0.9–3.3)

## 2015-06-14 LAB — COMPREHENSIVE METABOLIC PANEL (CC13)
ALBUMIN: 3 g/dL — AB (ref 3.5–5.0)
ALT: 30 U/L (ref 0–55)
AST: 46 U/L — ABNORMAL HIGH (ref 5–34)
Alkaline Phosphatase: 219 U/L — ABNORMAL HIGH (ref 40–150)
Anion Gap: 7 mEq/L (ref 3–11)
BUN: 7.6 mg/dL (ref 7.0–26.0)
CALCIUM: 8.8 mg/dL (ref 8.4–10.4)
CHLORIDE: 108 meq/L (ref 98–109)
CO2: 27 meq/L (ref 22–29)
CREATININE: 0.8 mg/dL (ref 0.6–1.1)
EGFR: 79 mL/min/{1.73_m2} — ABNORMAL LOW (ref >90)
Glucose: 154 mg/dl — ABNORMAL HIGH (ref 70–140)
Potassium: 3.2 mEq/L — ABNORMAL LOW (ref 3.5–5.1)
Sodium: 142 mEq/L (ref 136–145)
TOTAL PROTEIN: 5.5 g/dL — AB (ref 6.4–8.3)
Total Bilirubin: 0.58 mg/dL (ref 0.20–1.20)

## 2015-06-14 LAB — POCT INR: INR: 3.7

## 2015-06-14 LAB — PREPARE RBC (CROSSMATCH)

## 2015-06-14 LAB — PROTIME-INR
INR: 3.7 — ABNORMAL HIGH (ref 2.00–3.50)
PROTIME: 44.4 s — AB (ref 10.6–13.4)

## 2015-06-14 LAB — FECAL OCCULT BLOOD, GUAIAC: OCCULT BLOOD: NEGATIVE

## 2015-06-14 LAB — CEA: CEA: 2.9 ng/mL (ref 0.0–5.0)

## 2015-06-14 MED ORDER — ACETAMINOPHEN 325 MG PO TABS
ORAL_TABLET | ORAL | Status: AC
  Filled 2015-06-14: qty 2

## 2015-06-14 MED ORDER — SODIUM CHLORIDE 0.9 % IJ SOLN
10.0000 mL | INTRAMUSCULAR | Status: DC | PRN
  Administered 2015-06-14: 10 mL via INTRAVENOUS
  Filled 2015-06-14: qty 10

## 2015-06-14 MED ORDER — SODIUM CHLORIDE 0.9 % IV SOLN
250.0000 mL | Freq: Once | INTRAVENOUS | Status: AC
  Administered 2015-06-14: 250 mL via INTRAVENOUS

## 2015-06-14 MED ORDER — ACETAMINOPHEN 325 MG PO TABS
650.0000 mg | ORAL_TABLET | Freq: Once | ORAL | Status: AC
  Administered 2015-06-14: 650 mg via ORAL

## 2015-06-14 MED ORDER — DIPHENHYDRAMINE HCL 25 MG PO CAPS
25.0000 mg | ORAL_CAPSULE | Freq: Once | ORAL | Status: AC
  Administered 2015-06-14: 25 mg via ORAL

## 2015-06-14 MED ORDER — SODIUM CHLORIDE 0.9 % IJ SOLN
10.0000 mL | Freq: Once | INTRAMUSCULAR | Status: AC
  Administered 2015-06-14: 10 mL
  Filled 2015-06-14: qty 10

## 2015-06-14 MED ORDER — PANTOPRAZOLE SODIUM 40 MG PO TBEC: 40.0000 mg | DELAYED_RELEASE_TABLET | Freq: Two times a day (BID) | ORAL | Status: AC

## 2015-06-14 MED ORDER — HEPARIN SOD (PORK) LOCK FLUSH 100 UNIT/ML IV SOLN
500.0000 [IU] | Freq: Every day | INTRAVENOUS | Status: AC | PRN
  Administered 2015-06-14: 500 [IU]
  Filled 2015-06-14: qty 5

## 2015-06-14 MED ORDER — DIPHENHYDRAMINE HCL 25 MG PO CAPS
ORAL_CAPSULE | ORAL | Status: AC
  Filled 2015-06-14: qty 2

## 2015-06-14 NOTE — Telephone Encounter (Signed)
Appointments made per pof and patient will get a new avs in chemo today   anne

## 2015-06-14 NOTE — Telephone Encounter (Signed)
Spoke with [facility] @ Dr. Carlean Purl.  Was informed that pt has appt with Janett Billow, [place] for Dr. Carlean Purl on 06/15/15 at 130 pm.    Pt cannot come for GI consult today 8/11 due to pt still receiving blood transfusion at [facility]. Written information given to pt while still in infusion room.  Gave pt stool cards with instructions to collect sample. [facility]   Phone    [phone].

## 2015-06-14 NOTE — Telephone Encounter (Signed)
I spoke with Elray Buba, RN at oncology.  Patient hasn't even started her blood transfusions yet.  She will not be done by 2:45 appt time today.  She will come in and see Alonza Bogus, PA on 06/15/15 1:30.  Elray Buba will notify the patient of the appt date and time

## 2015-06-14 NOTE — Progress Notes (Signed)
INR = 3.7.  Hgb=6.8.  Allison Mitchell reports dark tarry stools.  Chemo on hold today and will receive PRBC.  I spoke with Dr. Burr Medico.  Will hold coumadin for now.  Dr Burr Medico is setting up Allison Mitchell with GI.  Will f/u next week if resuming coumadin.  I spoke with Allison Mitchell and she understands not to take any more coumadin until she is told to resume.

## 2015-06-14 NOTE — Telephone Encounter (Signed)
Called by Dr. Burr Medico - patient being Tx for [place] and reports of melena in past week Brown stool in colostomy now  She is in warfarin and they will hold  Getting 2 U PRBC today  Advised we will call back to   [phone] Dr. Burr Medico or her RN and communicate re: appt today or tomorrow and EGD early next week most likely

## 2015-06-14 NOTE — Addendum Note (Signed)
Addended by: Truitt Merle on: 06/14/2015 11:35 AM   Modules accepted: Orders

## 2015-06-14 NOTE — Patient Instructions (Signed)

## 2015-06-14 NOTE — Patient Instructions (Signed)

## 2015-06-14 NOTE — Progress Notes (Signed)
[place]  [phone] Fax:([phone]) [place] Note   Patient Care Team: [name], MD as PCP - General (Family Medicine) Alexis Frock, MD as Consulting Physician (Urology) Sheilah Mins, MD as Referring Physician (Surgical Oncology)  CHIEF COMPLAINTS:  Follow-up colon cancer  Oncology History   Next week.Colorectal cancer, stage III   Staging form: Neuroendocrine Tumor - Colon/Rectum, AJCC 7th Edition     Clinical: Stage Unknown (T4, NX, M0) - Unsigned     Pathologic: No stage assigned - Unsigned       Colorectal cancer, stage III   10/04/2014 Tumor Marker CEA 1.0 .  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 mpT4aN2aM0,stage IIIC.    10/10/2014 Surgery partial right hemicolectomy with removal of terminal ileum, sigmoid colectomy, right ureter lysis, (+) residual surgical margin    11/23/2014 - 01/06/2015 Chemotherapy FOLFOX every 2 weeks for 4 cycles    01/23/2015 - 03/02/2015 Radiation Therapy adjuvant radiation   03/27/2015 Progression CT chest, abdomen and pelvis showed new capsular nodules along the posterior margin of hepatic segment 6, measuring 2.1X1.1cm, and then you omental nodule, suspicious for new metastasis.   03/29/2015 -  Chemotherapy Restarted FOLFOX every 2 weeks   OTHER ISSUES: 1. Pulmonary embolism in segment omental branches of the right lung, diagnosed on 11/28/2014 2. Right ureter stent placed on 10/20/2014  CURRENT THERAPY: FOLFOX every 2 weeks, started on 11/23/2014, restarted on 03/29/2015 after radiation   HISTORY OF PRESENTING ILLNESS:  Allison Mitchell 54 y.o. female is here for follow-up after her hospital discharge. She was recently diagnosed with 3 synchronous to colon cancer, status post surgical resection. I initially saw her when  she was diagnosed in the hospital.  She presented with some GI symptoms since last October 2014 at which time she was diagnosed with ischemic colitis. Due to guaiac-positive stools, patient had undergone an outpatient colonoscopy on 09/26/2014, which showed inflamed and ulcerated mucosa in the terminal ileum, and a 1.5 cm mass in the ascending colon and additional 1.5 cm mass in the mid sigmoid colon. All biopsy from the above 3 sites came back positive for invasive moderate differentiated adenocarcinoma, and the sigmoid mass containing squamous cell differentiation.   She was hospitalized for worsening of her symptoms on 10/03/2014.  A CT of the abdomen and pelvis with contrast on 10/02/2014 was remarkable for soft tissue/bowel wall thickening of the right lower pelvis center at the cecum-terminal ileum involving a portion of the adjacent sigmoid colon. This causes small bowel obstruction and obstructed the distal right ureter causing moderate to severe right hydroureteronephrosis. [facility] to [facility] for surgical management and further testing. CT of the chest with contrast on 10/03/2014 negative for metastatic disease. MRI of the liver without and without contrast on 10/03/2014 is remarkable for small hepatic cysts, but no findings suspicious for hepatic metastatic disease. Right-sided hydroureteronephrosis was once again seen, with early small bowel obstruction bowel gas pattern.  She underwent right hemicolectomy with primary anastomosis with sigmoid colectomy with end colostomy on Friday, Dec 5, after cystoscopy is performed on 12/4 to rule out GU invasion and a ureteral stent placement.  She has family history of colon cancer in her grandmother and polyps in her sister. Denies risk factors for HIV or hepatitis. Denies Diabetes. No  Tobacco. No ETOH. Denies prior radiation. SHe did consume significant amount of red meat until recently. We are asked to see the  patient in consultation, with recommendations on the oncology standpoint  She is recovering slowly after the surgery. She had the surgical suture opened about 3 weeks ago and was treated for antibiotics , and now much improved. She has no significant pain, but mild abdominal discomfort, no nausea, her stool is getting thicker, she clean her colostomy bag daily. She was seen by Dr. Hulen Skains this morning. She noticed some cloudy urine in the past few days, with mild burning sensation, no fever or back pain. Her appetite is getting better, eats well.  She lost about 25 lbs. No fever or chills. No other new complains.   INTERIM HISTORY: Allison Mitchell returns for follow-up and cycle 10 chemotherapy. She had melena 2-3 times a day for 3 days, from last Saturday to Monday, no hematochezia, no fever, nausea, abdominal pain, dyspnea, chest pain or other symptoms. The melena resolved spontaneously, and her stool has been normal, formed, dark brown, for the past 3 days. She does notice slightly more fatigued lately, but able to function well at home. No other signs of bleeding.  Her INR is 3.7 today.  MEDICAL HISTORY:  Past Medical History  Diagnosis Date  . GERD (gastroesophageal reflux disease)   . Depression   . Anxiety   . 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 Peripheral neuropathy due to chemotherapy   . Hydronephrosis, right     malignant--  s/p reimiplant right ureter  . History of pulmonary embolus (PE)     11-28-2014--  due to chemotherapy on coumadin  . Anticoagulated on Coumadin   . Colorectal cancer, stage III oncologist--  dr Stefani Dama    Dx  10-10-2014---Stage IIIC, mpT4aN2aM0, Multifocal synchronous, Moderate differentiated Invasive, Mets to nodes (5 out of 17)/  s/p  Resection tumor cecum, terminal ileum, sigmoid/  currently chemoradiation therapy  . Dysuria   . S/P radiation therapy 03/02/15    completed pelvis/abd/50.4Gy    SURGICAL HISTORY: Past Surgical History    Procedure Laterality Date  . Cystoscopy with retrograde pyelogram, ureteroscopy and stent placement Right 10/05/2014    Procedure: [place], URETEROSCOPY  AND STENT PLACEMENT;  Surgeon: Alexis Frock, MD;  Location: [facility];  Service: Urology;  Laterality: Right;  . Laparotomy N/A 10/10/2014    Procedure: EXPLORATORY LAPAROTOMY;  Surgeon: Doreen Salvage, MD;  Location: [place];  Service: General;  Laterality: N/A;  . Partial colectomy  10/10/2014    Procedure: PARTIAL SIGMOID COLECTOMY;  Surgeon: Doreen Salvage, MD;  Location: [place];  Service: General;;  . Colostomy  10/10/2014    Procedure: COLOSTOMY;  Surgeon: Doreen Salvage, MD;  Location: [place];  Service: General;;  . Colostomy revision  10/10/2014    Procedure: PARTIAL RIGHT COLECTOMY;  Surgeon: Doreen Salvage, MD;  Location: [place];  Service: General;;  . Ureteral reimplantion Right 10/10/2014    Procedure: OPEN RIGHT URETERAL LYSIS ;  Surgeon: Alexis Frock, MD;  Location: [place];  Service: Urology;  Laterality: Right;  . Portacath placement N/A 10/17/2014    Procedure: INSERTION PORT-A-CATH LEFT SUBCLAVIAN AND MIDLINE INCISION STAPLE REMOVAL ;  Surgeon: Coralie Keens, MD;  Location: [place];  Service: General;  Laterality: N/A;  . Cystoscopy w/ retrogrades Right 01/10/2015    Procedure: CYSTOSCOPY WITH RETROGRADE PYELOGRAM;  Surgeon: Alexis Frock, MD;  Location: [facility];  Service: Urology;  Laterality: Right;  . Cystoscopy w/ ureteral stent placement Right 01/10/2015    Procedure: CYSTOSCOPY WITH STENT REPLACEMENT;  Surgeon: Alexis Frock, MD;  Location: [facility];  Service: Urology;  Laterality: Right;    SOCIAL HISTORY: Social History   Social History  . Marital Status: Married    Spouse Name: N/A  . Number of Children: N/A  . Years of Education: N/A   Occupational History  . Not on file.   Social History Main Topics  . Smoking status: Never Smoker   . Smokeless tobacco: Never Used  .  Alcohol Use: No  . Drug Use: No  . Sexual Activity: No   Other Topics Concern  . 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 Cancer Mother     skin cancer   . Diabetes Father   . CAD Father     Onset in his 48s, has 7 stents  . Cancer Maternal Grandmother 8    colon cancer   . Cancer Paternal Grandmother 18    breast cancer     ALLERGIES:  is allergic to amoxicillin.  MEDICATIONS:  Current Outpatient Prescriptions  Medication Sig Dispense Refill  . citalopram (CELEXA) 20 MG tablet Take 20-40 mg by mouth every morning.   1  . lidocaine-prilocaine (EMLA) cream Apply 1 application topically as needed. 30 g 0  . magnesium hydroxide (MILK OF MAGNESIA) 400 MG/5ML suspension Take 5 mLs by mouth daily as needed for mild constipation.    Marland Kitchen nystatin (MYCOSTATIN) 100000 UNIT/ML suspension Take 5 mLs (500,000 Units total) by mouth 4 (four) times daily as needed. Take 5 ml by mouth Four times daily as needed for mouth thrush. 120 mL 0  . ondansetron (ZOFRAN) 8 MG tablet Take 1 tablet (8 mg total) by mouth every 8 (eight) hours as needed for nausea or vomiting. 20 tablet 3  . oxyCODONE (OXY IR/ROXICODONE) 5 MG immediate release tablet Take 1 tablet (5 mg total) by mouth every 6 (six) hours as needed for severe pain. 30 tablet 0  . potassium chloride SA (K-DUR,KLOR-CON) 20 MEQ tablet Take 1 tablet (20 mEq total) by mouth daily. 14 tablet 1  . PREVIDENT 5000 BOOSTER PLUS 1.1 % PSTE 1 each by Other route daily.   5  . warfarin (COUMADIN) 5 MG tablet Take 1 tablet (5 mg total) by mouth daily. Take one tablet (71m) on MWF and half a tablet (2.569m on all other days. 30 tablet 3  . zolpidem (AMBIEN) 5 MG tablet Take 1 tablet (5 mg total) by mouth at bedtime as needed for sleep. 30 tablet 0  . pantoprazole (PROTONIX) 40 MG tablet  Take 1 tablet (40 mg total) by mouth 2 (two) times daily. 60 tablet 1   No current facility-administered medications for this visit.   Facility-Administered Medications Ordered in Other Visits  Medication Dose Route Frequency Provider Last Rate Last Dose  . sodium chloride 0.9 % injection 10 mL  10 mL Intracatheter PRN YaTruitt MerleMD        REVIEW OF SYSTEMS:   Constitutional: Denies fevers, chills or abnormal night sweats Eyes: Denies blurriness of vision, double vision or watery eyes Ears, nose, mouth, throat, and face: Denies mucositis or sore throat Respiratory: Denies cough,  dyspnea or wheezes Cardiovascular: Denies palpitation, chest discomfort or lower extremity swelling Gastrointestinal:  Denies nausea, heartburn or change in bowel habits Skin: Denies abnormal skin rashes Lymphatics: Denies new lymphadenopathy or easy bruising Neurological:Denies numbness, tingling or new weaknesses Behavioral/Psych: Mood is stable, no new changes  All other systems were reviewed with the patient and are negative.  PHYSICAL EXAMINATION: ECOG PERFORMANCE STATUS: 1 - Symptomatic but completely ambulatory  Filed Vitals:   06/14/15 0957  BP: 141/69  Pulse: 89  Temp: 98.4 F (36.9 C)  Resp: 18   Filed Weights   06/14/15 0957  Weight: 205 lb 11.2 oz (93.305 kg)    GENERAL:alert, no distress and comfortable SKIN: skin color, texture, turgor are normal, no rashes or significant lesions EYES: normal, conjunctiva are pink and non-injected, sclera clear OROPHARYNX:no exudate, no erythema and lips, buccal mucosa, and tongue normal  NECK: supple, thyroid normal size, non-tender, without nodularity LYMPH:  no palpable lymphadenopathy in the cervical, axillary or inguinal LUNGS: clear to auscultation and percussion with normal breathing effort HEART: regular rate & rhythm and no murmurs and no lower extremity edema ABDOMEN:abdomen soft, non-tender and normal bowel sounds. Positive for colostomy  bag on the left, with normal-appearing stool. Surgical wound upper part is well-healed. Musculoskeletal:no cyanosis of digits and no clubbing  PSYCH: alert & oriented x 3 with fluent speech NEURO: no focal motor/sensory deficits  LABORATORY DATA:  CBC Latest Ref Rng 06/14/2015 05/31/2015 05/16/2015  WBC 3.9 - 10.3 10e3/uL 7.5 6.2 2.7(L)  Hemoglobin 11.6 - 15.9 g/dL 6.8(LL) 8.1(L) 7.8(L)  Hematocrit 34.8 - 46.6 % 21.6(L) 25.7(L) 25.0(L)  Platelets 145 - 400 10e3/uL 100(L) 155 163    CMP Latest Ref Rng 06/14/2015 05/31/2015 05/16/2015  Glucose 70 - 140 mg/dl 154(H) 126 120  BUN 7.0 - 26.0 mg/dL 7.6 7.5 6.5(L)  Creatinine 0.6 - 1.1 mg/dL 0.8 0.9 0.9  Sodium 136 - 145 mEq/L 142 143 141  Potassium 3.5 - 5.1 mEq/L 3.2(L) 3.6 3.6  Chloride 96 - 112 mmol/L - - -  CO2 22 - 29 mEq/L 27 25 27   Calcium 8.4 - 10.4 mg/dL 8.8 8.6 8.9  Total Protein 6.4 - 8.3 g/dL 5.5(L) 6.0(L) 6.1(L)  Total Bilirubin 0.20 - 1.20 mg/dL 0.58 0.40 0.47  Alkaline Phos 40 - 150 U/L 219(H) 230(H) 191(H)  AST 5 - 34 U/L 46(H) 47(H) 30  ALT 0 - 55 U/L 30 31 26    PATHOLOGY REPORTS:  Surgical path 10/10/2014 1. Colon, segmental resection for tumor, Cecum, terminal ileum, sigmoid colon - MULTIFOCAL INVASIVE ADENOCARCINOMA, MODERATELY DIFFERENTIATED, THE LARGEST FOCUS SPANS 4.0 CM. - ADENOCARCINOMA INVOLVES SEROSA. - LYMPHOVASCULAR INVASION IS IDENTIFIED. - METASTATIC CARCINOMA IN 5 OF 17 LYMPH NODES (5/17). - THE PROXIMAL AND DISTAL SURGICAL RESECTION MARGINS ARE NEGATIVE FOR ADENOCARCINOMA. - SEE ONCOLOGY TABLE BELOW. 2. Soft tissue, biopsy, Right periureteral tissue - ADENOCARCINOMA. 3. Soft tissue, biopsy, Right periureteral tissue - INFLAMED AND DEGENERATING FIBROADIPOSE TISSUE. - THERE IS NO EVIDENCE OF MALIGNANCY. Microscopic Comment 1. COLON AND RECTUM (INCLUDING TRANS-ANAL RESECTION): Specimen: Distal ileum, cecum, proximal ascending colon, and adherent sigmoid colon. Procedure: Resection. Tumor site: Multiple  foci, including ileocecal valve, ascending colon and sigmoid. Specimen integrity: Disrupted. Macroscopic intactness of mesorectum: N/A Macroscopic tumor perforation: Present. Invasive tumor: Maximum size: 4.0 cm. Histologic type(s): Adenocarcinoma. Histologic grade and differentiation: G2: moderately differentiated Type of polyp in which invasive carcinoma arose: Tubulovillous adenomas. Microscopic extension of invasive tumor: Focally involves the serosa. Lymph-Vascular invasion: Present. Peri-neural invasion: Not identified. Tumor  deposit(s) (discontinuous extramural extension): Not identified. Resection margins: Proximal margin: 4.0 cm Distal margin: 3.5 cm Circumferential (radial) (posterior ascending, posterior descending; lateral and posterior mid-rectum; and entire lower 1/3 rectum): Cannot be assessed. Mesenteric margin (sigmoid and transverse): Cannot be assessed. Treatment effect (neo-adjuvant therapy): N/A Additional polyp(s): Not identified. Non-neoplastic findings: No significant findings. Lymph nodes: number examined 17; number positive: 5 Pathologic Staging: mpT4a, pN2a, pMX Ancillary studies: A block will be sent for MSI testing by [facility] and MSI testing by PCR and the results reported separately. Comment: There appear to be three synchronous primary tumors in the specimen, one at the ileocecal valve, one in the ascending colon, and one at the sigmoid colon. Each of these foci contain an associated tubulovillous adenoma, suggesting three separate primary sites. The tumor in the sigmoid colon appears to involve the serosa as well. The other two tumors show extension into at least the pericolonic soft tissue. Dr Mali Rund has reviewed selected slides and concurs that there are likely three synchronous tumors here. The case was discussed with Dr Hulen Skains. Per Dr Hulen Skains, the specimen in #2 is a contiguous piece of tissue 2 of 4 Amended copy Amended FINAL for Allison Mitchell, Allison Mitchell  [phone]) Microscopic Comment(continued) with the main tumor, and therefore, not considered a distant metastatic focus. Therefore, the tumor is staged as an MX. (JBK:ecj 10/16/2014) JOSHUA  Mismatch Repair (MMR) Protein Immunohistochemistry (IHC) IHC Expression Result: MLH1: Preserved nuclear expression (greater 50% tumor expression) MSH2: Preserved nuclear expression (greater 50% tumor expression) MSH6: Preserved nuclear expression (greater 50% tumor expression) PMS2: Preserved nuclear expression (greater 50% tumor expression) * Internal control demonstrates intact nuclear expression Interpretation: NORMAL There is preserved expression  Diagnosis 04/16/2015 Liver, needle/core biopsy - METASTATIC ADENOCARCINOMA, SEE COMMENT. Microscopic Comment There are well formed malignant glands with mucin. The morphology combined with the patient's history are consistent with metastatic colorectal carcinoma. Dr. Burr Medico was notified on 04/17/2015. Per request, tissue will be sent for RAS testing.     RADIOGRAPHIC STUDIES: I have personally reviewed the radiological images as listed and agreed with the findings in the report.  CT of chest, abdomen and pelvis 03/27/2015  IMPRESSION: 1. Suspicion for new peritoneal metastatic disease including a 2.1 by 1.1 cm capsular nodule along the posterior margin of hepatic segment 6, and a new small right anterior omental nodule. 2. Several additional hypodense tiny lesions in the liver are stable. 3. Moderate right hydronephrosis and hydroureter with periureteral stranding, despite the presence of the double-J ureteral stent. However, there is no asymmetry in enhancement or excretion of the kidneys. 4. Prominence of stool in the colon extending to the colostomy site. 5. Other findings include gastroesophageal reflux; a small type 1 hiatal hernia; atherosclerosis; suspected chronic right hydrosalpinx ; and a segmental S1 vertebra with grade 1  degenerative anterolisthesis at the L5-S1 level.  Abdominal MRI with and without contrast 05/03/2015 IMPRESSION: Irregular peripheral enhancement of the new lesion seen along the posterior capsule of the right liver is highly suspicious for metastatic involvement in this patient with a history of colon cancer. This lesion may be amenable to percutaneous tissue sampling, if clinically warranted.   ASSESSMENT & PLAN:  54 year old Caucasian female with minimal past medical history, who was found to have 3 multifocal colon cancer at terminal ileum/cecum, ascending colon and sigmoid colon. All of them has adenocarcinoma,  And the cecum tumor has squamous differentiation. The cecum tumor has directly invaded the soft tissue along the right ureter, which was not completely resected (  1% tumor left over, per Dr. Hulen Skains). She has mpT4aN2aM0 stage IIIB, MSI/MMR normal.   1. Multifocal stage IIIB colon cancer, MMR normal, MSI stable, biopsy confirmed liver and probable peritoneal metastases, KRAS mutated  -I discussed her surgical path findings extensively with patient and her husband. Unfortunately, the tumor was not completely resected. She has very small amount of tumor left over. -Given her residual tumor after surgery, advanced stage III disease, high risk recurrence in the future,  we recommended sequential adjuvant chemotherapy and irradiation -Her restaging scan after radiation unfortunately showed  probable peritoneal metastasis in liver and omentum. Liver biopsy confirmed metastasis.  -I reviewed with the abdominal MRI findings with her. The biopsied liver metastases is stable, there is additional 5 mm lesions on MRI, no significant peritoneal metastasis on the MRI. -I also reviewed her K-ras mutation results with her, she would not benefit from EGFR targeted therapy -She was referred and seen by Dr. Johney Maine to consider surgical debulking and HIPEC, and she will see Dr. Clovis Riley for follow up  on 9/7 and possible surgery. I have personally communicated with Dr. Clovis Riley.  -We discussed to add on Avastin if she decides not to pursue surgery -No benefit of EGFR inhibitor giving KRAS mutation (+)  -Giving the possible upper GI bleeding, and mild thrombocytopenia, I'll hold chemotherapy today  2. Melena, worsening anemia, rule out GI bleeding  -Her hemoglobin dropped from baseline 8 to 6.8 today, she did have melena for 3 days -She is also on Coumadin, INR slightly supratherapeutic today, and platelets 100 today, she has multiple risk for bleeding -I recommend GI workup to ruled out bleeding, I spoke with Dr. Carlean Purl this morning, and his office will arrange appointment for today or tomorrow, and possible endoscopy early next week. -I'll hold Coumadin for now, until after her endoscopy.  3. PE  -She was on Lovenox injection for a month then switched to oral anticoagulant Coumadin.  -Follow-up with Coumadin clinic.  -Continue Coumadin indefinitely giving the underlying malignancy -Hold Coumadin for now, due to the possible GI bleeding. Will check stool OB today, if positive, will give oral Vit K to reverse coumadin   4. right ureter stent placement -She will follow up with her urologist Dr. Tresa Moore   -her urinary symptoms likely related to the stent, previous multiple culture were negative. - she is scheduled to have the stent taken out on September 13, I suggest her to discuss with Dr. Tresa Moore again due to the pending surgery in September or October   5. 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 Her iron study is consistent with iron deficient anemia -This is secondary to the bleeding from her colon cancer. -She received IV Feraheme 583m twice.  -Repeated iron study   6.G1 peripheral neuropathy, fatigue  -Secondary to chemotherapy, improved  -Continue close monitoring  Plan, -hold chemo today -blood transfusion with 2u RBC today -hold coumadin, if stool OB(+), will give  low dose vitK -urgent referral to GI, spoke with Dr. GCarlean Purl -I will see her back next week   All questions were answered. The patient knows to call the clinic with any problems, questions or concerns.  I spent 20 minutes counseling the patient face to face. The total time spent in the appointment was 25 minutes and more than 50% was on counseling.     FTruitt Merle MD 06/14/2015 10:45 AM

## 2015-06-15 ENCOUNTER — Ambulatory Visit (INDEPENDENT_AMBULATORY_CARE_PROVIDER_SITE_OTHER): Payer: BLUE CROSS/BLUE SHIELD | Admitting: Gastroenterology

## 2015-06-15 ENCOUNTER — Telehealth: Payer: Self-pay | Admitting: *Deleted

## 2015-06-15 ENCOUNTER — Telehealth: Payer: Self-pay | Admitting: Hematology

## 2015-06-15 ENCOUNTER — Encounter: Payer: Self-pay | Admitting: Gastroenterology

## 2015-06-15 VITALS — BP 120/76 | HR 84 | Ht 68.0 in | Wt 206.8 lb

## 2015-06-15 DIAGNOSIS — C19 Malignant neoplasm of rectosigmoid junction: Secondary | ICD-10-CM | POA: Diagnosis not present

## 2015-06-15 DIAGNOSIS — Z7901 Long term (current) use of anticoagulants: Secondary | ICD-10-CM | POA: Diagnosis not present

## 2015-06-15 DIAGNOSIS — K921 Melena: Secondary | ICD-10-CM | POA: Insufficient documentation

## 2015-06-15 DIAGNOSIS — D649 Anemia, unspecified: Secondary | ICD-10-CM

## 2015-06-15 LAB — TYPE AND SCREEN
ABO/RH(D): A POS
Antibody Screen: NEGATIVE
UNIT DIVISION: 0
UNIT DIVISION: 0

## 2015-06-15 NOTE — Telephone Encounter (Signed)
Faxed pt medical records to Dr. Rosezella [place] office. [phone]. Dr Donnal Moat will review the records. If Dr. Donnal Moat agrees to see the pt his office will contact pt.   Turnaround time 1-3 days.

## 2015-06-15 NOTE — Telephone Encounter (Signed)
Tiffany,  Could you send her medical records to Dr. Donnal Moat at [facility]? Referral was made a few weeks ago. Please inform her husband when the records sent. Thanks.  Truitt Merle  06/15/2015

## 2015-06-15 NOTE — Telephone Encounter (Signed)
Called husband Lewis and left message on voice mail to inform him that pt's records had been sent to Dr. Donnal Moat at [facility].

## 2015-06-15 NOTE — Patient Instructions (Addendum)
You have been scheduled for an endoscopy. Please follow written instructions given to you at your visit today. If you use inhalers (even only as needed), please bring them with you on the day of your procedure.  Please continue holding your coumadin until your procedure on 06-19-15 at 1:30  Your physician has requested that you go to the basement for lab work on 06-19-15 at 11:00am to get a PT/INR done before your procedure that day.

## 2015-06-15 NOTE — Progress Notes (Signed)
06/15/2015 Allison Mitchell [id number] [birth year]   HISTORY OF PRESENT ILLNESS:  This is a very pleasant 54 year old female who is new to our practice. She was referred here by Dr. Burr Medico, her oncologist, for evaluation regarding recent drop in hemoglobin and very dark stools.  She is being treated for multifocal stage IIIB colon cancer.  Had surgery with colostomy placement by Dr. Hulen Skains in December 2015. Hgb down from baseline fo around 8 grams to 6.8 grams yesterday at oncology appt.  Received 2 units PRBC's yesterday.  Reported very dark stools for a couple of days earlier this week, but heme negative yesterday.  Dr. Burr Medico requesting EGD and spoke with Dr. Carlean Purl who agreed to have her seen and scheduled for EGD early next week.  She is on Coumadin for PE in January 2016 related to the chemotherapy. Her Coumadin has been on hold as of yesterday.  INR yesterday was 3.7.  She denies NSAID use.  Is on protonix for quite some time for acid reflux.  No other complaints.   Past Medical History  Diagnosis Date  . GERD (gastroesophageal reflux disease)   . Depression   . Anxiety   . 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 Peripheral neuropathy due to chemotherapy   . Hydronephrosis, right     malignant--  s/p reimiplant right ureter  . History of pulmonary embolus (PE)     11-28-2014--  due to chemotherapy on coumadin  . Anticoagulated on Coumadin   . Colorectal cancer, stage III oncologist--  dr Stefani Dama    Dx  10-10-2014---Stage IIIC, [id number], Multifocal synchronous, Moderate differentiated Invasive, Mets to nodes (5 out of 17)/  s/p  Resection tumor cecum, terminal ileum, sigmoid/  currently chemoradiation therapy  . Dysuria   . S/P radiation therapy 03/02/15    completed pelvis/abd/50.4Gy   Past Surgical History  Procedure Laterality Date  . Cystoscopy with retrograde pyelogram, ureteroscopy and stent placement Right 10/05/2014    Procedure: [place], URETEROSCOPY  AND STENT PLACEMENT;  Surgeon: Alexis Frock, MD;  Location: [facility];  Service: Urology;  Laterality: Right;  . Laparotomy N/A 10/10/2014    Procedure: EXPLORATORY LAPAROTOMY;  Surgeon: Doreen Salvage, MD;  Location: [place];  Service: General;  Laterality: N/A;  . Partial colectomy  10/10/2014    Procedure: PARTIAL SIGMOID COLECTOMY;  Surgeon: Doreen Salvage, MD;  Location: [place];  Service: General;;  . Colostomy  10/10/2014    Procedure: COLOSTOMY;  Surgeon: Doreen Salvage, MD;  Location: [place];  Service: General;;  . Colostomy revision  10/10/2014    Procedure: PARTIAL RIGHT COLECTOMY;  Surgeon: Doreen Salvage, MD;  Location: [place];  Service: General;;  . Ureteral reimplantion Right 10/10/2014    Procedure: OPEN RIGHT URETERAL LYSIS ;  Surgeon: Alexis Frock, MD;  Location: [place];  Service: Urology;  Laterality: Right;  . Portacath placement N/A 10/17/2014    Procedure: INSERTION PORT-A-CATH LEFT SUBCLAVIAN AND MIDLINE INCISION STAPLE REMOVAL ;  Surgeon: Coralie Keens, MD;  Location: [place];  Service: General;  Laterality: N/A;  . Cystoscopy w/ retrogrades Right 01/10/2015    Procedure: CYSTOSCOPY WITH RETROGRADE PYELOGRAM;  Surgeon: Alexis Frock, MD;  Location: [facility];  Service: Urology;  Laterality: Right;  . Cystoscopy w/ ureteral stent placement Right 01/10/2015    Procedure: CYSTOSCOPY WITH STENT REPLACEMENT;  Surgeon: Alexis Frock, MD;  Location: [facility];  Service: Urology;  Laterality: Right;  reports that she has never smoked. She has never used smokeless tobacco. She reports that she does not drink alcohol or use illicit drugs. family history includes CAD in her father; Cancer in her mother; Cancer (age of onset: 60) in her paternal grandmother; Cancer (age of onset: 41) in her maternal grandmother; Diabetes in her father. Allergies  Allergen Reactions  . Amoxicillin Other (See Comments)    Yeast infection      Outpatient  Encounter Prescriptions as of 06/15/2015  Medication Sig  . citalopram (CELEXA) 20 MG tablet Take 20-40 mg by mouth every morning.   . lidocaine-prilocaine (EMLA) cream Apply 1 application topically as needed.  . magnesium hydroxide (MILK OF MAGNESIA) 400 MG/5ML suspension Take 5 mLs by mouth daily as needed for mild constipation.  . ondansetron (ZOFRAN) 8 MG tablet Take 1 tablet (8 mg total) by mouth every 8 (eight) hours as needed for nausea or vomiting.  Marland Kitchen oxyCODONE (OXY IR/ROXICODONE) 5 MG immediate release tablet Take 1 tablet (5 mg total) by mouth every 6 (six) hours as needed for severe pain.  . pantoprazole (PROTONIX) 40 MG tablet Take 1 tablet (40 mg total) by mouth 2 (two) times daily.  . potassium chloride SA (K-DUR,KLOR-CON) 20 MEQ tablet Take 1 tablet (20 mEq total) by mouth daily.  Marland Kitchen PREVIDENT 5000 BOOSTER PLUS 1.1 % PSTE 1 each by Other route daily.   Marland Kitchen zolpidem (AMBIEN) 5 MG tablet Take 1 tablet (5 mg total) by mouth at bedtime as needed for sleep.  Marland Kitchen warfarin (COUMADIN) 5 MG tablet Take 1 tablet (5 mg total) by mouth daily. Take one tablet (5mg ) on MWF and half a tablet (2.5mg ) on all other days. (Patient not taking: Reported on 06/15/2015)  . [DISCONTINUED] nystatin (MYCOSTATIN) 100000 UNIT/ML suspension Take 5 mLs (500,000 Units total) by mouth 4 (four) times daily as needed. Take 5 ml by mouth Four times daily as needed for mouth thrush.   Facility-Administered Encounter Medications as of 06/15/2015  Medication  . sodium chloride 0.9 % injection 10 mL     REVIEW OF SYSTEMS  : All other systems reviewed and negative except where noted in the History of Present Illness.   PHYSICAL EXAM: BP 120/76 mmHg  Pulse 84  Ht 5\' 8"  (1.727 m)  Wt 206 lb 12.8 oz (93.804 kg)  BMI 31.45 kg/m2 General: Well developed white female in no acute distress Head: Normocephalic and atraumatic Eyes:  Sclerae anicteric, conjunctiva pink. Ears: Normal auditory acuity Lungs: Clear throughout to  auscultation Heart: Regular rate and rhythm Abdomen: Soft, non-distended.  Normal bowel sounds.  Non-tender.  Colostomy noted on left abdomen with solid brown stool.  Well-healed laparotomy scar noted. Musculoskeletal: Symmetrical with no gross deformities  Skin: No lesions on visible extremities Extremities: No edema  Neurological: Alert oriented x 4, grossly non-focal Psychological:  Alert and cooperative. Normal mood and affect  ASSESSMENT AND PLAN: -55 year old female being treated for multifocal stage IIIB colon cancer.  Hgb down from baseline to 6.8 grams.  Received 2 units PRBC's yesterday.  Reported very dark stools for a couple of days earlier this week, but heme negative yesterday.  Dr. Burr Medico requesting EGD.  Will schedule with Dr. Carlean Purl early next week.  The risks, benefits, and alternatives to EGD were discussed with the patient and she consents to proceed.  -History of PE in 11/2014 due to chemo, on coumadin, but coumadin on hold per oncology as of yesterday.  INR was 3.7 yesterday.  Will  check STAT PT/INR on the day of her procedure.   CC:  [name], MD

## 2015-06-15 NOTE — Telephone Encounter (Signed)
Received message from husband Lewis  wanting to know status of referral to Dr. Donnal Moat at [facility] for pt.  Lewis stated he was informed by Dr. Rosezella [place] office that no records of pt had been sent to their office yet.   Lewis stated he would like for pt's records to be sent to Dr. Donnal Moat ASAP, and would like a call confirming that records sent as well. Dr. Dawna Part     Fax     [phone]. Bobby Rumpf'    Phone       [phone].

## 2015-06-19 ENCOUNTER — Ambulatory Visit (AMBULATORY_SURGERY_CENTER): Payer: BLUE CROSS/BLUE SHIELD | Admitting: Internal Medicine

## 2015-06-19 ENCOUNTER — Other Ambulatory Visit (INDEPENDENT_AMBULATORY_CARE_PROVIDER_SITE_OTHER): Payer: BLUE CROSS/BLUE SHIELD

## 2015-06-19 ENCOUNTER — Encounter: Payer: Self-pay | Admitting: Internal Medicine

## 2015-06-19 VITALS — BP 122/92 | HR 75 | Temp 97.6°F | Resp 14 | Ht 68.0 in | Wt 206.0 lb

## 2015-06-19 DIAGNOSIS — K921 Melena: Secondary | ICD-10-CM

## 2015-06-19 DIAGNOSIS — D649 Anemia, unspecified: Secondary | ICD-10-CM

## 2015-06-19 DIAGNOSIS — K2210 Ulcer of esophagus without bleeding: Secondary | ICD-10-CM

## 2015-06-19 DIAGNOSIS — K208 Other esophagitis: Secondary | ICD-10-CM

## 2015-06-19 LAB — PROTIME-INR
INR: 1 ratio (ref 0.8–1.0)
Prothrombin Time: 11.5 s (ref 9.6–13.1)

## 2015-06-19 MED ORDER — SODIUM CHLORIDE 0.9 % IV SOLN: 500.0000 mL | INTRAVENOUS | Status: DC

## 2015-06-19 NOTE — Progress Notes (Signed)
Transferred to recovery room. A/O x3, pleased with MAC.  VSS.  Report to Jane, RN. 

## 2015-06-19 NOTE — Op Note (Signed)
[place]  [address] [place], [address]   ENDOSCOPY PROCEDURE REPORT  PATIENT: Allison Mitchell, Allison Mitchell  MR#: [id number] BIRTHDATE: [birth year] , 67  yrs. old GENDER: female ENDOSCOPIST: Gatha Mayer, MD, [facility] PROCEDURE DATE:  06/19/2015 PROCEDURE:  EGD w/ biopsy ASA CLASS:     Class II INDICATIONS:  melena. MEDICATIONS: Propofol 160 mg IV and Monitored anesthesia care TOPICAL ANESTHETIC: none  DESCRIPTION OF PROCEDURE: After the risks benefits and alternatives of the procedure were thoroughly explained, informed consent was obtained.  The LB [id number] endoscope was introduced through the mouth and advanced to the second portion of the duodenum , Without limitations.  The instrument was slowly withdrawn as the mucosa was fully examined.    1) 2 areas of linear erosions at GE junction - biopsied 2) small hiatal hernia 3) Otherwise normal EGD.  Retroflexed views revealed as previously described.     The scope was then withdrawn from the patient and the procedure completed.  COMPLICATIONS: There were no immediate complications.  ENDOSCOPIC IMPRESSION: 1) 2 areas of linear erosions at GE junction - biopsied 2) small hiatal hernia 3) Otherwise normal EGD  RECOMMENDATIONS: Restart warfarin 2x NL dose tonight then normal dose tomorrow and ongoing Have INR check next week w/ Dr.  Burr Medico stay on pantoprazole will call results  REPEAT EXAM:  eSigned:  Gatha Mayer, MD, [facility] 06/19/2015 1:40 PM    CC:Yan Burr Medico, MD and The Patient

## 2015-06-19 NOTE — Patient Instructions (Addendum)
I found some inflammation where the esophagus and stomach meet - you could have bled from that - biopsies taken. Also have a small hiatal hernia - stomach moves into chest - not a problem.  Please restart warfarin - take twice normal dose tonight then regular dose tomorrow and ongoing.  I will let you know biopsy results when they come in.  You should get your blood checked for INR in 1 week w/ Dr. Burr Medico  I appreciate the opportunity to care for you.  Gatha Mayer, MD, FACG  YOU HAD AN ENDOSCOPIC PROCEDURE TODAY AT [facility]:   Refer to the procedure report that was given to you for any specific questions about what was found during the examination.  If the procedure report does not answer your questions, please call your gastroenterologist to clarify.  If you requested that your care partner not be given the details of your procedure findings, then the procedure report has been included in a sealed envelope for you to review at your convenience later.  YOU SHOULD EXPECT: Some feelings of bloating in the abdomen. Passage of more gas than usual.  Walking can help get rid of the air that was put into your GI tract during the procedure and reduce the bloating. If you had a lower endoscopy (such as a colonoscopy or flexible sigmoidoscopy) you may notice spotting of blood in your stool or on the toilet paper. If you underwent a bowel prep for your procedure, you may not have a normal bowel movement for a few days.  Please Note:  You might notice some irritation and congestion in your nose or some drainage.  This is from the oxygen used during your procedure.  There is no need for concern and it should clear up in a day or so.  SYMPTOMS TO REPORT IMMEDIATELY:  Following upper endoscopy (EGD)  Vomiting of blood or coffee ground material  New chest pain or pain under the shoulder blades  Painful or persistently difficult swallowing  New shortness of breath  Fever of  100F or higher  Black, tarry-looking stools  For urgent or emergent issues, a gastroenterologist can be reached at any hour by calling [phone].   DIET: Your first meal following the procedure should be a small meal and then it is ok to progress to your normal diet. Heavy or fried foods are harder to digest and may make you feel nauseous or bloated.  Likewise, meals heavy in dairy and vegetables can increase bloating.  Drink plenty of fluids but you should avoid alcoholic beverages for 24 hours.  ACTIVITY:  You should plan to take it easy for the rest of today and you should NOT DRIVE or use heavy machinery until tomorrow (because of the sedation medicines used during the test).    FOLLOW UP: Our staff will call the number listed on your records the next business day following your procedure to check on you and address any questions or concerns that you may have regarding the information given to you following your procedure. If we do not reach you, we will leave a message.  However, if you are feeling well and you are not experiencing any problems, there is no need to return our call.  We will assume that you have returned to your regular daily activities without incident.  If any biopsies were taken you will be contacted by phone or by letter within the next 1-3 weeks.  Please call us at [phone] if you have not heard about the biopsies in 3 weeks.    SIGNATURES/CONFIDENTIALITY: You and/or your care partner have signed paperwork which will be entered into your electronic medical record.  These signatures attest to the fact that that the information above on your After Visit Summary has been reviewed and is understood.  Full responsibility of the confidentiality of this discharge information lies with you and/or your care-partner.  Hiatal hernia information given.

## 2015-06-19 NOTE — Progress Notes (Signed)
Pt. Went to lab this a.m. @ 10:00, bandage noted to bilateral hand upon admission,pt. Removed bandage and bilateral hands noted with small hematoma and bruising noted pt. Stated that they blew my veins when taking blood.

## 2015-06-19 NOTE — Progress Notes (Signed)
Called to room to assist during endoscopic procedure.  Patient ID and intended procedure confirmed with present staff. Received instructions for my participation in the procedure from the performing physician.  

## 2015-06-20 ENCOUNTER — Telehealth: Payer: Self-pay | Admitting: *Deleted

## 2015-06-20 NOTE — Telephone Encounter (Signed)
  Follow up Call-  Call back number 06/19/2015  Post procedure Call Back phone  # [phone]  Permission to leave phone message Yes     No answer at # given.  Left message on voicemail.

## 2015-06-21 ENCOUNTER — Telehealth: Payer: Self-pay | Admitting: Hematology

## 2015-06-21 NOTE — Telephone Encounter (Signed)
Moved 8/19 appointments to AM per YF - aware of GI clinic - spoke with patient she is aware.

## 2015-06-22 ENCOUNTER — Telehealth: Payer: Self-pay | Admitting: Pharmacist

## 2015-06-22 ENCOUNTER — Telehealth: Payer: Self-pay | Admitting: Hematology

## 2015-06-22 ENCOUNTER — Ambulatory Visit (HOSPITAL_BASED_OUTPATIENT_CLINIC_OR_DEPARTMENT_OTHER): Payer: BLUE CROSS/BLUE SHIELD | Admitting: Hematology

## 2015-06-22 ENCOUNTER — Other Ambulatory Visit (HOSPITAL_BASED_OUTPATIENT_CLINIC_OR_DEPARTMENT_OTHER): Payer: BLUE CROSS/BLUE SHIELD

## 2015-06-22 ENCOUNTER — Encounter: Payer: Self-pay | Admitting: Hematology

## 2015-06-22 ENCOUNTER — Ambulatory Visit: Payer: BLUE CROSS/BLUE SHIELD

## 2015-06-22 VITALS — BP 147/65 | HR 82 | Temp 98.4°F | Resp 17 | Ht 68.0 in | Wt 209.8 lb

## 2015-06-22 DIAGNOSIS — C189 Malignant neoplasm of colon, unspecified: Secondary | ICD-10-CM

## 2015-06-22 DIAGNOSIS — D509 Iron deficiency anemia, unspecified: Secondary | ICD-10-CM

## 2015-06-22 DIAGNOSIS — G620 Drug-induced polyneuropathy: Secondary | ICD-10-CM

## 2015-06-22 DIAGNOSIS — I2699 Other pulmonary embolism without acute cor pulmonale: Secondary | ICD-10-CM

## 2015-06-22 DIAGNOSIS — C180 Malignant neoplasm of cecum: Secondary | ICD-10-CM

## 2015-06-22 DIAGNOSIS — C187 Malignant neoplasm of sigmoid colon: Secondary | ICD-10-CM | POA: Diagnosis not present

## 2015-06-22 DIAGNOSIS — C182 Malignant neoplasm of ascending colon: Secondary | ICD-10-CM | POA: Diagnosis not present

## 2015-06-22 DIAGNOSIS — C787 Secondary malignant neoplasm of liver and intrahepatic bile duct: Secondary | ICD-10-CM

## 2015-06-22 DIAGNOSIS — C19 Malignant neoplasm of rectosigmoid junction: Secondary | ICD-10-CM

## 2015-06-22 DIAGNOSIS — R5383 Other fatigue: Secondary | ICD-10-CM

## 2015-06-22 DIAGNOSIS — K921 Melena: Secondary | ICD-10-CM

## 2015-06-22 LAB — COMPREHENSIVE METABOLIC PANEL (CC13)
ALBUMIN: 2.9 g/dL — AB (ref 3.5–5.0)
ALK PHOS: 196 U/L — AB (ref 40–150)
ALT: 23 U/L (ref 0–55)
ANION GAP: 10 meq/L (ref 3–11)
AST: 35 U/L — ABNORMAL HIGH (ref 5–34)
BUN: 7.3 mg/dL (ref 7.0–26.0)
CO2: 23 mEq/L (ref 22–29)
Calcium: 8.6 mg/dL (ref 8.4–10.4)
Chloride: 111 mEq/L — ABNORMAL HIGH (ref 98–109)
Creatinine: 0.8 mg/dL (ref 0.6–1.1)
EGFR: 80 mL/min/{1.73_m2} — AB (ref >90)
GLUCOSE: 144 mg/dL — AB (ref 70–140)
POTASSIUM: 3.5 meq/L (ref 3.5–5.1)
Sodium: 143 mEq/L (ref 136–145)
Total Bilirubin: 0.6 mg/dL (ref 0.20–1.20)
Total Protein: 5.7 g/dL — ABNORMAL LOW (ref 6.4–8.3)

## 2015-06-22 LAB — CBC WITH DIFFERENTIAL/PLATELET
BASO%: 0.7 % (ref 0.0–2.0)
BASOS ABS: 0 10*3/uL (ref 0.0–0.1)
EOS ABS: 0 10*3/uL (ref 0.0–0.5)
EOS%: 0.9 % (ref 0.0–7.0)
HCT: 29.3 % — ABNORMAL LOW (ref 34.8–46.6)
HGB: 9.3 g/dL — ABNORMAL LOW (ref 11.6–15.9)
LYMPH%: 7.8 % — AB (ref 14.0–49.7)
MCH: 27.9 pg (ref 25.1–34.0)
MCHC: 31.9 g/dL (ref 31.5–36.0)
MCV: 87.7 fL (ref 79.5–101.0)
MONO#: 0.5 10*3/uL (ref 0.1–0.9)
MONO%: 11.7 % (ref 0.0–14.0)
NEUT#: 3.5 10*3/uL (ref 1.5–6.5)
NEUT%: 78.9 % — ABNORMAL HIGH (ref 38.4–76.8)
PLATELETS: 123 10*3/uL — AB (ref 145–400)
RBC: 3.34 10*6/uL — AB (ref 3.70–5.45)
RDW: 26 % — ABNORMAL HIGH (ref 11.2–14.5)
WBC: 4.5 10*3/uL (ref 3.9–10.3)
lymph#: 0.4 10*3/uL — ABNORMAL LOW (ref 0.9–3.3)

## 2015-06-22 LAB — PROTIME-INR
INR: 1.5 — AB (ref 2.00–3.50)
PROTIME: 18 s — AB (ref 10.6–13.4)

## 2015-06-22 LAB — CEA: CEA: 2.2 ng/mL (ref 0.0–5.0)

## 2015-06-22 NOTE — Telephone Encounter (Signed)
Patient in to see MD today but did not stay for CC appt. She experienced melena last week and had a decreased Hb requiring 2 units PRBC. Endoscopy was ordered and Coumadin was placed on hold. She had endoscopy 8/16 and resumed Coumadin that evening. I called and left a VM on her husband's cell phone today asking for her to call us. She returns 8/25 for lab, flush, and MD appt. I would like to ask her to schedule a CC appt on that date.   Marland Kitchen

## 2015-06-22 NOTE — Progress Notes (Addendum)
[place]  [phone] Fax:([phone]) [place] Note   Patient Care Team: [name], MD as PCP - General (Family Medicine) Alexis Frock, MD as Consulting Physician (Urology) Sheilah Mins, MD as Referring Physician (Surgical Oncology) Gatha Mayer, MD as Consulting Physician (Gastroenterology)  CHIEF COMPLAINTS:  Follow-up colon cancer  Oncology History   Next week.Colorectal cancer, stage III   Staging form: Neuroendocrine Tumor - Colon/Rectum, AJCC 7th Edition     Clinical: Stage Unknown (T4, NX, M0) - Unsigned     Pathologic: No stage assigned - Unsigned       Colorectal cancer, stage III   10/04/2014 Tumor Marker CEA 1.0 .  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 mpT4aN2aM0,stage IIIC.    10/10/2014 Surgery partial right hemicolectomy with removal of terminal ileum, sigmoid colectomy, right ureter lysis, (+) residual surgical margin    11/23/2014 - 01/06/2015 Chemotherapy FOLFOX every 2 weeks for 4 cycles    01/23/2015 - 03/02/2015 Radiation Therapy adjuvant radiation   03/27/2015 Progression CT chest, abdomen and pelvis showed new capsular nodules along the posterior margin of hepatic segment 6, measuring 2.1X1.1cm, and then you omental nodule, suspicious for new metastasis.   03/29/2015 -  Chemotherapy Restarted FOLFOX every 2 weeks   OTHER ISSUES: 1. Pulmonary embolism in segment omental branches of the right lung, diagnosed on 11/28/2014 2. Right ureter stent placed on 10/20/2014  CURRENT THERAPY: FOLFOX every 2 weeks, started on 11/23/2014, restarted on 03/29/2015 after radiation   HISTORY OF PRESENTING ILLNESS:  Allison Mitchell 54 y.o. female is here for follow-up after her hospital discharge. She was recently diagnosed with 3 synchronous to colon  cancer, status post surgical resection. I initially saw her when she was diagnosed in the hospital.  She presented with some GI symptoms since last October 2014 at which time she was diagnosed with ischemic colitis. Due to guaiac-positive stools, patient had undergone an outpatient colonoscopy on 09/26/2014, which showed inflamed and ulcerated mucosa in the terminal ileum, and a 1.5 cm mass in the ascending colon and additional 1.5 cm mass in the mid sigmoid colon. All biopsy from the above 3 sites came back positive for invasive moderate differentiated adenocarcinoma, and the sigmoid mass containing squamous cell differentiation.   She was hospitalized for worsening of her symptoms on 10/03/2014.  A CT of the abdomen and pelvis with contrast on 10/02/2014 was remarkable for soft tissue/bowel wall thickening of the right lower pelvis center at the cecum-terminal ileum involving a portion of the adjacent sigmoid colon. This causes small bowel obstruction and obstructed the distal right ureter causing moderate to severe right hydroureteronephrosis. She was transferred from [facility] to [facility] for surgical management and further testing. CT of the chest with contrast on 10/03/2014 negative for metastatic disease. MRI of the liver without and without contrast on 10/03/2014 is remarkable for small hepatic cysts, but no findings suspicious for hepatic metastatic disease. Right-sided hydroureteronephrosis was once again seen, with early small bowel obstruction bowel gas pattern.  She underwent right hemicolectomy with primary anastomosis with sigmoid colectomy with end colostomy on Friday, Dec 5, after cystoscopy is performed on 12/4 to rule out GU invasion and a ureteral stent placement.  She has family history of colon cancer in her grandmother and polyps in her sister. Denies risk  factors for HIV or hepatitis. Denies Diabetes. No Tobacco. No ETOH. Denies prior radiation. SHe did consume  significant amount of red meat until recently. We are asked to see the patient in consultation, with recommendations on the oncology standpoint  She is recovering slowly after the surgery. She had the surgical suture opened about 3 weeks ago and was treated for antibiotics , and now much improved. She has no significant pain, but mild abdominal discomfort, no nausea, her stool is getting thicker, she clean her colostomy bag daily. She was seen by Dr. Hulen Skains this morning. She noticed some cloudy urine in the past few days, with mild burning sensation, no fever or back pain. Her appetite is getting better, eats well.  She lost about 25 lbs. No fever or chills. No other new complains.   INTERIM HISTORY: Allison Mitchell returns for follow-up. She feels better after blood transfusion, and her chemotherapy was held last week due to concern of GI bleeding. Coumadin was held also. She was referred to Dr. Carlean Purl and had an endoscopy done 3 days ago. It showed mild esophagitis, no significant bleeding or other lesions. Coumadin was restarted after the endoscopy. She feels well overall, no new complaints.  MEDICAL HISTORY:  Past Medical History  Diagnosis Date  . GERD (gastroesophageal reflux disease)   . Depression   . Anxiety   . 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 Peripheral neuropathy due to chemotherapy   . Hydronephrosis, right     malignant--  s/p reimiplant right ureter  . History of pulmonary embolus (PE)     11-28-2014--  due to chemotherapy on coumadin  . Anticoagulated on Coumadin   . Colorectal cancer, stage III oncologist--  dr Stefani Dama    Dx  10-10-2014---Stage IIIC, mpT4aN2aM0, Multifocal synchronous, Moderate differentiated Invasive, Mets to nodes (5 out of 17)/  s/p  Resection tumor cecum, terminal ileum, sigmoid/  currently chemoradiation therapy  . Dysuria   . S/P radiation therapy 03/02/15    completed pelvis/abd/50.4Gy  . Allergy     SEASONAL  . Blood  transfusion without reported diagnosis     SURGICAL HISTORY: Past Surgical History  Procedure Laterality Date  . Cystoscopy with retrograde pyelogram, ureteroscopy and stent placement Right 10/05/2014    Procedure: [place], URETEROSCOPY  AND STENT PLACEMENT;  Surgeon: Alexis Frock, MD;  Location: [facility];  Service: Urology;  Laterality: Right;  . Laparotomy N/A 10/10/2014    Procedure: EXPLORATORY LAPAROTOMY;  Surgeon: Doreen Salvage, MD;  Location: [place];  Service: General;  Laterality: N/A;  . Partial colectomy  10/10/2014    Procedure: PARTIAL SIGMOID COLECTOMY;  Surgeon: Doreen Salvage, MD;  Location: [place];  Service: General;;  . Colostomy  10/10/2014    Procedure: COLOSTOMY;  Surgeon: Doreen Salvage, MD;  Location: [place];  Service: General;;  . Colostomy revision  10/10/2014    Procedure: PARTIAL RIGHT COLECTOMY;  Surgeon: Doreen Salvage, MD;  Location: [place];  Service: General;;  . Ureteral reimplantion Right 10/10/2014    Procedure: OPEN RIGHT URETERAL LYSIS ;  Surgeon: Alexis Frock, MD;  Location: [place];  Service: Urology;  Laterality: Right;  . Portacath placement N/A 10/17/2014    Procedure: INSERTION PORT-A-CATH LEFT SUBCLAVIAN AND MIDLINE INCISION STAPLE REMOVAL ;  Surgeon: Coralie Keens, MD;  Location: [place];  Service: General;  Laterality: N/A;  . Cystoscopy w/ retrogrades Right 01/10/2015    Procedure: CYSTOSCOPY WITH RETROGRADE PYELOGRAM;  Surgeon: Alexis Frock,  MD;  Location: [facility];  Service: Urology;  Laterality: Right;  . Cystoscopy w/ ureteral stent placement Right 01/10/2015    Procedure: CYSTOSCOPY WITH STENT REPLACEMENT;  Surgeon: Alexis Frock, MD;  Location: [facility];  Service: Urology;  Laterality: Right;    SOCIAL HISTORY: Social History   Social History  . Marital Status: Married    Spouse Name: N/A  . Number of Children: N/A  . Years of Education: N/A   Occupational History  . Not on file.   Social  History Main Topics  . Smoking status: Never Smoker   . Smokeless tobacco: Never Used  . Alcohol Use: No  . Drug Use: No  . Sexual Activity: No   Other Topics Concern  . 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 Cancer Mother     skin cancer   . Diabetes Father   . CAD Father     Onset in his 43s, has 7 stents  . Heart disease Father   . Cancer Maternal Grandmother 41    colon cancer   . Breast cancer Maternal Grandmother   . Cancer Paternal Grandmother 70    breast cancer   . Breast cancer Paternal Grandmother     ALLERGIES:  is allergic to amoxicillin.  MEDICATIONS:  Current Outpatient Prescriptions  Medication Sig Dispense Refill  . citalopram (CELEXA) 20 MG tablet Take 20-40 mg by mouth every morning.   1  . lidocaine-prilocaine (EMLA) cream Apply 1 application topically as needed. 30 g 0  . magnesium hydroxide (MILK OF MAGNESIA) 400 MG/5ML suspension Take 5 mLs by mouth daily as needed for mild constipation.    . ondansetron (ZOFRAN) 8 MG tablet Take 1 tablet (8 mg total) by mouth every 8 (eight) hours as needed for nausea or vomiting. 20 tablet 3  . oxyCODONE (OXY IR/ROXICODONE) 5 MG immediate release tablet Take 1 tablet (5 mg total) by mouth every 6 (six) hours as needed for severe pain. 30 tablet 0  . pantoprazole (PROTONIX) 40 MG tablet Take 1 tablet (40 mg total) by mouth 2 (two) times daily. 60 tablet 1  . potassium chloride SA (K-DUR,KLOR-CON) 20 MEQ tablet Take 1 tablet (20 mEq total) by mouth daily. (Patient not taking: Reported on 06/19/2015) 14 tablet 1  . PREVIDENT 5000 BOOSTER PLUS 1.1 % PSTE 1 each by Other route daily.   5  . warfarin (COUMADIN) 5 MG tablet Take 1 tablet (5 mg total) by mouth daily. Take one tablet (44m) on MWF and half a tablet (2.59m on all other days. (Patient not  taking: Reported on 06/15/2015) 30 tablet 3  . zolpidem (AMBIEN) 5 MG tablet Take 1 tablet (5 mg total) by mouth at bedtime as needed for sleep. 30 tablet 0   No current facility-administered medications for this visit.   Facility-Administered Medications Ordered in Other Visits  Medication Dose Route Frequency Provider Last Rate Last Dose  . sodium chloride 0.9 % injection 10 mL  10 mL Intracatheter PRN YaTruitt MerleMD        REVIEW OF SYSTEMS:   Constitutional: Denies fevers, chills or abnormal night sweats Eyes: Denies blurriness of vision, double vision or watery eyes Ears, nose, mouth, throat, and face: Denies mucositis or sore throat  Respiratory: Denies cough, dyspnea or wheezes Cardiovascular: Denies palpitation, chest discomfort or lower extremity swelling Gastrointestinal:  Denies nausea, heartburn or change in bowel habits Skin: Denies abnormal skin rashes Lymphatics: Denies new lymphadenopathy or easy bruising Neurological:Denies numbness, tingling or new weaknesses Behavioral/Psych: Mood is stable, no new changes  All other systems were reviewed with the patient and are negative.  PHYSICAL EXAMINATION: ECOG PERFORMANCE STATUS: 1 - Symptomatic but completely ambulatory  Filed Vitals:   06/22/15 1013  BP: 147/65  Pulse: 82  Temp: 98.4 F (36.9 C)  Resp: 17   Filed Weights   06/22/15 1013  Weight: 209 lb 12.8 oz (95.165 kg)    GENERAL:alert, no distress and comfortable SKIN: skin color, texture, turgor are normal, no rashes or significant lesions EYES: normal, conjunctiva are pink and non-injected, sclera clear OROPHARYNX:no exudate, no erythema and lips, buccal mucosa, and tongue normal  NECK: supple, thyroid normal size, non-tender, without nodularity LYMPH:  no palpable lymphadenopathy in the cervical, axillary or inguinal LUNGS: clear to auscultation and percussion with normal breathing effort HEART: regular rate & rhythm and no murmurs and no lower extremity  edema ABDOMEN:abdomen soft, non-tender and normal bowel sounds. Positive for colostomy bag on the left, with normal-appearing stool. Surgical wound upper part is well-healed. Musculoskeletal:no cyanosis of digits and no clubbing  PSYCH: alert & oriented x 3 with fluent speech NEURO: no focal motor/sensory deficits  LABORATORY DATA:  CBC Latest Ref Rng 06/22/2015 06/14/2015 05/31/2015  WBC 3.9 - 10.3 10e3/uL 4.5 7.5 6.2  Hemoglobin 11.6 - 15.9 g/dL 9.3(L) 6.8(LL) 8.1(L)  Hematocrit 34.8 - 46.6 % 29.3(L) 21.6(L) 25.7(L)  Platelets 145 - 400 10e3/uL 123(L) 100(L) 155    CMP Latest Ref Rng 06/22/2015 06/14/2015 05/31/2015  Glucose 70 - 140 mg/dl 144(H) 154(H) 126  BUN 7.0 - 26.0 mg/dL 7.3 7.6 7.5  Creatinine 0.6 - 1.1 mg/dL 0.8 0.8 0.9  Sodium 136 - 145 mEq/L 143 142 143  Potassium 3.5 - 5.1 mEq/L 3.5 3.2(L) 3.6  Chloride 96 - 112 mmol/L - - -  CO2 22 - 29 mEq/L _0 Calcium 8.4 - 10.4 mg/dL 8.6 8.8 8.6  Total Protein 6.4 - 8.3 g/dL 5.7(L) 5.5(L) 6.0(L)  Total Bilirubin 0.20 - 1.20 mg/dL 0.60 0.58 0.40  Alkaline Phos 40 - 150 U/L 196(H) 219(H) 230(H)  AST 5 - 34 U/L 35(H) 46(H) 47(H)  ALT 0 - 55 U/L _1 PATHOLOGY REPORTS:  Surgical path 10/10/2014 1. Colon, segmental resection for tumor, Cecum, terminal ileum, sigmoid colon - MULTIFOCAL INVASIVE ADENOCARCINOMA, MODERATELY DIFFERENTIATED, THE LARGEST FOCUS SPANS 4.0 CM. - ADENOCARCINOMA INVOLVES SEROSA. - LYMPHOVASCULAR INVASION IS IDENTIFIED. - METASTATIC CARCINOMA IN 5 OF 17 LYMPH NODES (5/17). - THE PROXIMAL AND DISTAL SURGICAL RESECTION MARGINS ARE NEGATIVE FOR ADENOCARCINOMA. - SEE ONCOLOGY TABLE BELOW. 2. Soft tissue, biopsy, Right periureteral tissue - ADENOCARCINOMA. 3. Soft tissue, biopsy, Right periureteral tissue - INFLAMED AND DEGENERATING FIBROADIPOSE TISSUE. - THERE IS NO EVIDENCE OF MALIGNANCY. Microscopic Comment 1. COLON AND RECTUM (INCLUDING TRANS-ANAL RESECTION): Specimen: Distal ileum, cecum,  proximal ascending colon, and adherent sigmoid colon. Procedure: Resection. Tumor site: Multiple foci, including ileocecal valve, ascending colon and sigmoid. Specimen integrity: Disrupted. Macroscopic intactness of mesorectum: N/A Macroscopic tumor perforation: Present. Invasive tumor: Maximum size: 4.0 cm. Histologic type(s): Adenocarcinoma. Histologic grade and differentiation: G2: moderately differentiated Type of polyp in which invasive carcinoma arose: Tubulovillous adenomas. Microscopic extension of invasive tumor: Focally involves the serosa. Lymph-Vascular invasion: Present. Peri-neural invasion:  Not identified. Tumor deposit(s) (discontinuous extramural extension): Not identified. Resection margins: Proximal margin: 4.0 cm Distal margin: 3.5 cm Circumferential (radial) (posterior ascending, posterior descending; lateral and posterior mid-rectum; and entire lower 1/3 rectum): Cannot be assessed. Mesenteric margin (sigmoid and transverse): Cannot be assessed. Treatment effect (neo-adjuvant therapy): N/A Additional polyp(s): Not identified. Non-neoplastic findings: No significant findings. Lymph nodes: number examined 17; number positive: 5 Pathologic Staging: mpT4a, pN2a, pMX Ancillary studies: A block will be sent for MSI testing by [facility] and MSI testing by PCR and the results reported separately. Comment: There appear to be three synchronous primary tumors in the specimen, one at the ileocecal valve, one in the ascending colon, and one at the sigmoid colon. Each of these foci contain an associated tubulovillous adenoma, suggesting three separate primary sites. The tumor in the sigmoid colon appears to involve the serosa as well. The other two tumors show extension into at least the pericolonic soft tissue. Dr Mali Rund has reviewed selected slides and concurs that there are likely three synchronous tumors here. The case was discussed with Dr Hulen Skains. Per Dr Hulen Skains, the specimen  in #2 is a contiguous piece of tissue 2 of 4 Amended copy Amended FINAL for Allison Mitchell, Allison Mitchell [phone]) Microscopic Comment(continued) with the main tumor, and therefore, not considered a distant metastatic focus. Therefore, the tumor is staged as an MX. (JBK:ecj 10/16/2014) JOSHUA  Mismatch Repair (MMR) Protein Immunohistochemistry (IHC) IHC Expression Result: MLH1: Preserved nuclear expression (greater 50% tumor expression) MSH2: Preserved nuclear expression (greater 50% tumor expression) MSH6: Preserved nuclear expression (greater 50% tumor expression) PMS2: Preserved nuclear expression (greater 50% tumor expression) * Internal control demonstrates intact nuclear expression Interpretation: NORMAL There is preserved expression  Diagnosis 04/16/2015 Liver, needle/core biopsy - METASTATIC ADENOCARCINOMA, SEE COMMENT. Microscopic Comment There are well formed malignant glands with mucin. The morphology combined with the patient's history are consistent with metastatic colorectal carcinoma. Dr. Burr Medico was notified on 04/17/2015. Per request, tissue will be sent for RAS testing.     RADIOGRAPHIC STUDIES: I have personally reviewed the radiological images as listed and agreed with the findings in the report.  CT of chest, abdomen and pelvis 03/27/2015  IMPRESSION: 1. Suspicion for new peritoneal metastatic disease including a 2.1 by 1.1 cm capsular nodule along the posterior margin of hepatic segment 6, and a new small right anterior omental nodule. 2. Several additional hypodense tiny lesions in the liver are stable. 3. Moderate right hydronephrosis and hydroureter with periureteral stranding, despite the presence of the double-J ureteral stent. However, there is no asymmetry in enhancement or excretion of the kidneys. 4. Prominence of stool in the colon extending to the colostomy site. 5. Other findings include gastroesophageal reflux; a small type 1 hiatal hernia;  atherosclerosis; suspected chronic right hydrosalpinx ; and a segmental S1 vertebra with grade 1 degenerative anterolisthesis at the L5-S1 level.  Abdominal MRI with and without contrast 05/03/2015 IMPRESSION: Irregular peripheral enhancement of the new lesion seen along the posterior capsule of the right liver is highly suspicious for metastatic involvement in this patient with a history of colon cancer. This lesion may be amenable to percutaneous tissue sampling, if clinically warranted.  EGD 06/19/2015 Dr. Carlean Purl  1) 2 areas of linear erosions at GE junction - biopsied 2) small hiatal hernia 3) Otherwise normal EGD. Retroflexed views revealed as previously described. The scope was then withdrawn from the patient and the procedure completed. COMPLICATIONS: There were no immediate complications. ENDOSCOPIC IMPRESSION: 1) 2 areas of linear erosions at [employer]  junction - biopsied 2) small hiatal hernia 3) Otherwise normal EGD RECOMMENDATIONS: Restart warfarin 2x NL dose tonight then normal dose tomorrow and ongoing Have INR check next week w/ Dr. Burr Medico stay on pantoprazole  ASSESSMENT & PLAN:  54 year old Caucasian female with minimal past medical history, who was found to have 3 multifocal colon cancer at terminal ileum/cecum, ascending colon and sigmoid colon. All of them has adenocarcinoma,  And the cecum tumor has squamous differentiation. The cecum tumor has directly invaded the soft tissue along the right ureter, which was not completely resected (1% tumor left over, per Dr. Hulen Skains). She has mpT4aN2aM0 stage IIIB, MSI/MMR normal.   1. Multifocal stage IIIB colon cancer, MMR normal, MSI stable, biopsy confirmed liver and probable peritoneal metastases, KRAS mutated  -I discussed her surgical path findings extensively with patient and her husband. Unfortunately, the tumor was not completely resected. She has very small amount of tumor left over. -Given her residual tumor after surgery,  advanced stage III disease, high risk recurrence in the future,  we recommended sequential adjuvant chemotherapy and irradiation -Her restaging scan after radiation unfortunately showed  probable peritoneal metastasis in liver and omentum. Liver biopsy confirmed metastasis.  -I reviewed with the abdominal MRI findings with her. The biopsied liver metastases is stable, there is additional 5 mm lesions on MRI, no significant peritoneal metastasis on the MRI. -I also reviewed her K-ras mutation results with her, she would not benefit from EGFR targeted therapy -She was referred and seen by Dr. Johney Maine to consider surgical debulking and HIPEC, and she will see Dr. Clovis Riley for follow up on 9/7 and possible surgery. I have personally communicated with Dr. Clovis Riley.  -We discussed to add on Avastin if she decides not to pursue surgery -No benefit of EGFR inhibitor giving KRAS mutation (+)  -Her recent EEG shows no evidence of bleeding, we'll restart chemotherapy next week.  2. Melena, worsening anemia, rule out GI bleeding  -Her EGD showed mild esophagitis, no evidence of active bleeding or other lesions. -She restarted Coumadin after the endoscopy, blood counts respond well to blood transfusion. -Continue PPI. We will resume chemotherapy next week  3. PE  -She was on Lovenox injection for a month then switched to oral anticoagulant Coumadin.  -Follow-up with Coumadin clinic.  -Continue Coumadin indefinitely giving the underlying malignancy -She restarted Coumadin after EGD this week, we'll continue follow-up with Coumadin clinic. Her INR was 1.5 this morning, will repeat next week   4. right ureter stent placement -She will follow up with her urologist Dr. Tresa Moore   -her urinary symptoms likely related to the stent, previous multiple culture were negative. - she is scheduled to have the stent taken out on September 13, I suggest her to discuss with Dr. Tresa Moore again due to the pending surgery in  September or October   5. 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 Her iron study is consistent with iron deficient anemia -This is secondary to the bleeding from her colon cancer. -She received IV Feraheme 5104m twice.  -Repeated iron study last week showed ferritin 111, serum iron level normal 49, saturation 12%.  -oral iron replacement   6.G1 peripheral neuropathy, fatigue  -Secondary to chemotherapy, improved  -Continue close monitoring  Plan, -Return to clinic next Thursday for scheduled chemotherapy, I'll see her before chemo with lab.  -Next week will be her last scheduled chemotherapy. [facility] with a restaging CT scan on  the same day of her visit.   All questions were answered. The patient knows to call the clinic with any problems, questions or concerns.  I spent 20 minutes counseling the patient face to face. The total time spent in the appointment was 25 minutes and more than 50% was on counseling.     Truitt Merle, MD 06/22/2015 11:26 AM

## 2015-06-22 NOTE — Telephone Encounter (Signed)
per Gerald Stabs in [place] to sch [facility] pt aware

## 2015-06-25 ENCOUNTER — Encounter: Payer: Self-pay | Admitting: Hematology

## 2015-06-25 ENCOUNTER — Encounter: Payer: Self-pay | Admitting: Pharmacist

## 2015-06-25 NOTE — Progress Notes (Signed)
Agree with Ms. Zehr's management.  Binnie Vonderhaar E. Ebubechukwu Jedlicka, MD, FACG  

## 2015-06-25 NOTE — Progress Notes (Signed)
Patient's husband called today stating Allison Mitchell is complaining of "very dark stools" and is "concerned coumadin is causing bleeding and blood loss" patient has missed chemo because of blood loss and needed transfusions 10 days ago because of low hemoglobin. Pt is concerned about having to miss chemotherapy again this week if Hemoglobin is low.   Due to potential for active bleed. Instructed patient to hold coumadin at this time. Message to Dr. Burr Medico. Will update patient with instructions if ok to restart coumadin prior to chemo on Thursday 06/28/15

## 2015-06-26 ENCOUNTER — Telehealth: Payer: Self-pay | Admitting: *Deleted

## 2015-06-26 ENCOUNTER — Telehealth: Payer: Self-pay | Admitting: Hematology

## 2015-06-26 NOTE — Telephone Encounter (Signed)
Pt appt to see Dr. Donnal Moat is 07/18/15@10 :00 pt is aware

## 2015-06-26 NOTE — Telephone Encounter (Signed)
Spoke with pt and instructed pt to continue holding Coumadin until pt sees Dr. Burr Medico on 8/25.  Pt understood to call office if pt noticed any symptoms of bleeding so pt can be seen sooner.  Pt voiced understanding.

## 2015-06-28 ENCOUNTER — Telehealth: Payer: Self-pay | Admitting: *Deleted

## 2015-06-28 ENCOUNTER — Other Ambulatory Visit (HOSPITAL_BASED_OUTPATIENT_CLINIC_OR_DEPARTMENT_OTHER): Payer: BLUE CROSS/BLUE SHIELD

## 2015-06-28 ENCOUNTER — Encounter: Payer: Self-pay | Admitting: Hematology

## 2015-06-28 ENCOUNTER — Ambulatory Visit (INDEPENDENT_AMBULATORY_CARE_PROVIDER_SITE_OTHER): Payer: Self-pay | Admitting: Pharmacist

## 2015-06-28 ENCOUNTER — Ambulatory Visit: Payer: BLUE CROSS/BLUE SHIELD

## 2015-06-28 ENCOUNTER — Ambulatory Visit (HOSPITAL_BASED_OUTPATIENT_CLINIC_OR_DEPARTMENT_OTHER): Payer: BLUE CROSS/BLUE SHIELD | Admitting: Hematology

## 2015-06-28 ENCOUNTER — Ambulatory Visit (HOSPITAL_BASED_OUTPATIENT_CLINIC_OR_DEPARTMENT_OTHER): Payer: BLUE CROSS/BLUE SHIELD

## 2015-06-28 ENCOUNTER — Other Ambulatory Visit: Payer: Self-pay | Admitting: Hematology

## 2015-06-28 ENCOUNTER — Telehealth: Payer: Self-pay | Admitting: Hematology

## 2015-06-28 VITALS — BP 142/63 | HR 84 | Temp 98.5°F | Resp 18 | Ht 68.0 in | Wt 208.9 lb

## 2015-06-28 DIAGNOSIS — C182 Malignant neoplasm of ascending colon: Secondary | ICD-10-CM

## 2015-06-28 DIAGNOSIS — K921 Melena: Secondary | ICD-10-CM

## 2015-06-28 DIAGNOSIS — C19 Malignant neoplasm of rectosigmoid junction: Secondary | ICD-10-CM

## 2015-06-28 DIAGNOSIS — G620 Drug-induced polyneuropathy: Secondary | ICD-10-CM

## 2015-06-28 DIAGNOSIS — I2699 Other pulmonary embolism without acute cor pulmonale: Secondary | ICD-10-CM

## 2015-06-28 DIAGNOSIS — D509 Iron deficiency anemia, unspecified: Secondary | ICD-10-CM

## 2015-06-28 DIAGNOSIS — C187 Malignant neoplasm of sigmoid colon: Secondary | ICD-10-CM | POA: Diagnosis not present

## 2015-06-28 DIAGNOSIS — Z5111 Encounter for antineoplastic chemotherapy: Secondary | ICD-10-CM | POA: Diagnosis not present

## 2015-06-28 DIAGNOSIS — R5383 Other fatigue: Secondary | ICD-10-CM

## 2015-06-28 DIAGNOSIS — C787 Secondary malignant neoplasm of liver and intrahepatic bile duct: Secondary | ICD-10-CM

## 2015-06-28 DIAGNOSIS — C180 Malignant neoplasm of cecum: Secondary | ICD-10-CM | POA: Diagnosis not present

## 2015-06-28 DIAGNOSIS — Z95828 Presence of other vascular implants and grafts: Secondary | ICD-10-CM

## 2015-06-28 DIAGNOSIS — C189 Malignant neoplasm of colon, unspecified: Secondary | ICD-10-CM

## 2015-06-28 LAB — COMPREHENSIVE METABOLIC PANEL (CC13)
ALT: 15 U/L (ref 0–55)
ANION GAP: 8 meq/L (ref 3–11)
AST: 23 U/L (ref 5–34)
Albumin: 2.9 g/dL — ABNORMAL LOW (ref 3.5–5.0)
Alkaline Phosphatase: 183 U/L — ABNORMAL HIGH (ref 40–150)
BUN: 10.2 mg/dL (ref 7.0–26.0)
CHLORIDE: 111 meq/L — AB (ref 98–109)
CO2: 25 meq/L (ref 22–29)
CREATININE: 0.8 mg/dL (ref 0.6–1.1)
Calcium: 8.5 mg/dL (ref 8.4–10.4)
EGFR: 86 mL/min/{1.73_m2} — ABNORMAL LOW (ref >90)
Glucose: 129 mg/dl (ref 70–140)
POTASSIUM: 3.3 meq/L — AB (ref 3.5–5.1)
Sodium: 144 mEq/L (ref 136–145)
Total Bilirubin: 0.59 mg/dL (ref 0.20–1.20)
Total Protein: 5.6 g/dL — ABNORMAL LOW (ref 6.4–8.3)

## 2015-06-28 LAB — CBC WITH DIFFERENTIAL/PLATELET
BASO%: 1.2 % (ref 0.0–2.0)
BASOS ABS: 0 10*3/uL (ref 0.0–0.1)
EOS%: 0.9 % (ref 0.0–7.0)
Eosinophils Absolute: 0 10*3/uL (ref 0.0–0.5)
HCT: 24.8 % — ABNORMAL LOW (ref 34.8–46.6)
HGB: 8 g/dL — ABNORMAL LOW (ref 11.6–15.9)
LYMPH%: 8.5 % — ABNORMAL LOW (ref 14.0–49.7)
MCH: 28.2 pg (ref 25.1–34.0)
MCHC: 32.2 g/dL (ref 31.5–36.0)
MCV: 87.7 fL (ref 79.5–101.0)
MONO#: 0.5 10*3/uL (ref 0.1–0.9)
MONO%: 13.5 % (ref 0.0–14.0)
NEUT#: 2.8 10*3/uL (ref 1.5–6.5)
NEUT%: 75.9 % (ref 38.4–76.8)
Platelets: 112 10*3/uL — ABNORMAL LOW (ref 145–400)
RBC: 2.83 10*6/uL — AB (ref 3.70–5.45)
RDW: 24.1 % — ABNORMAL HIGH (ref 11.2–14.5)
WBC: 3.7 10*3/uL — ABNORMAL LOW (ref 3.9–10.3)
lymph#: 0.3 10*3/uL — ABNORMAL LOW (ref 0.9–3.3)

## 2015-06-28 LAB — CEA: CEA: 1.7 ng/mL (ref 0.0–5.0)

## 2015-06-28 LAB — PROTIME-INR
INR: 1.3 — AB (ref 2.00–3.50)
Protime: 15.6 Seconds — ABNORMAL HIGH (ref 10.6–13.4)

## 2015-06-28 LAB — FECAL OCCULT BLOOD, GUAIAC: Occult Blood: POSITIVE

## 2015-06-28 LAB — POCT INR: INR: 1.3

## 2015-06-28 MED ORDER — POTASSIUM CHLORIDE CRYS ER 20 MEQ PO TBCR
40.0000 meq | EXTENDED_RELEASE_TABLET | Freq: Once | ORAL | Status: AC
  Administered 2015-06-28: 40 meq via ORAL
  Filled 2015-06-28: qty 2

## 2015-06-28 MED ORDER — SODIUM CHLORIDE 0.9 % IV SOLN
2400.0000 mg/m2 | INTRAVENOUS | Status: DC
  Administered 2015-06-28: 5150 mg via INTRAVENOUS
  Filled 2015-06-28: qty 103

## 2015-06-28 MED ORDER — OXALIPLATIN CHEMO INJECTION 100 MG/20ML
85.0000 mg/m2 | Freq: Once | INTRAVENOUS | Status: AC
  Administered 2015-06-28: 185 mg via INTRAVENOUS
  Filled 2015-06-28: qty 37

## 2015-06-28 MED ORDER — LEUCOVORIN CALCIUM INJECTION 350 MG
400.0000 mg/m2 | Freq: Once | INTRAMUSCULAR | Status: AC
  Administered 2015-06-28: 860 mg via INTRAVENOUS
  Filled 2015-06-28: qty 43

## 2015-06-28 MED ORDER — SODIUM CHLORIDE 0.9 % IJ SOLN
10.0000 mL | INTRAMUSCULAR | Status: DC | PRN
  Administered 2015-06-28: 10 mL via INTRAVENOUS
  Filled 2015-06-28: qty 10

## 2015-06-28 MED ORDER — HEPARIN SOD (PORK) LOCK FLUSH 100 UNIT/ML IV SOLN
500.0000 [IU] | Freq: Once | INTRAVENOUS | Status: DC
  Filled 2015-06-28: qty 5

## 2015-06-28 MED ORDER — DEXTROSE 5 % IV SOLN
Freq: Once | INTRAVENOUS | Status: AC
  Administered 2015-06-28: 11:00:00 via INTRAVENOUS

## 2015-06-28 MED ORDER — SODIUM CHLORIDE 0.9 % IV SOLN
Freq: Once | INTRAVENOUS | Status: AC
  Administered 2015-06-28: 11:00:00 via INTRAVENOUS
  Filled 2015-06-28: qty 4

## 2015-06-28 NOTE — Telephone Encounter (Signed)
Per staff message and POF I have scheduled appts. Advised scheduler of appts. JMW  

## 2015-06-28 NOTE — Patient Instructions (Signed)
Continue to hold coumadin until further notice.

## 2015-06-28 NOTE — Patient Instructions (Signed)

## 2015-06-28 NOTE — Patient Instructions (Signed)
[facility] Discharge Instructions for Patients Receiving Chemotherapy  Today you received the following chemotherapy agents: Oxaliplatin, Leucovorin, and Adrucil   To help prevent nausea and vomiting after your treatment, we encourage you to take your nausea medication as directed.    If you develop nausea and vomiting that is not controlled by your nausea medication, call the clinic.   BELOW ARE SYMPTOMS THAT SHOULD BE REPORTED IMMEDIATELY:  *FEVER GREATER THAN 100.5 F  *CHILLS WITH OR WITHOUT FEVER  NAUSEA AND VOMITING THAT IS NOT CONTROLLED WITH YOUR NAUSEA MEDICATION  *UNUSUAL SHORTNESS OF BREATH  *UNUSUAL BRUISING OR BLEEDING  TENDERNESS IN MOUTH AND THROAT WITH OR WITHOUT PRESENCE OF ULCERS  *URINARY PROBLEMS  *BOWEL PROBLEMS  UNUSUAL RASH Items with * indicate a potential emergency and should be followed up as soon as possible.  Feel free to call the clinic you have any questions or concerns. The clinic phone number is [phone].  Please show the CHEMO ALERT CARD at check-in to the Emergency Department and triage nurse.   

## 2015-06-28 NOTE — Progress Notes (Signed)
Quick Note:  Biopsies show inflammation - stay on PPI so this does not recur See me prn No letter/recall ______

## 2015-06-28 NOTE — Telephone Encounter (Signed)
per pof to sch pt appt-per Dr Burr Medico phonce conversation I could have 10:45 MD only for pt-sent MW email to sch pt last trmt-pt to get updated avs b4 leaving

## 2015-06-28 NOTE — Progress Notes (Signed)
Pt seen by Dr. Burr Medico today. MD still concerned re possible continued bleeding. Per Dr. Burr Medico, will do stool OB x 3 today and will hold coumadin for now, will contact Dr. Carlean Purl if stool OB (+). F/U in 2 weeks. No charge coumadin clinic patient.

## 2015-06-28 NOTE — Progress Notes (Signed)
Reviewed labs with DR. Feng. Pt is to take 40 MEQs of potassium today and start back on 20 MEQs of potassium daily tomorrow. Pt aware and verbalizes understanding.

## 2015-06-28 NOTE — Progress Notes (Signed)
[place]  [phone] Fax:([phone]) [place] Note   Patient Care Team: [name], MD as PCP - General (Family Medicine) Alexis Frock, MD as Consulting Physician (Urology) Sheilah Mins, MD as Referring Physician (Surgical Oncology) Gatha Mayer, MD as Consulting Physician (Gastroenterology)  CHIEF COMPLAINTS:  Follow-up colon cancer  Oncology History   Next week.Colorectal cancer, stage III   Staging form: Neuroendocrine Tumor - Colon/Rectum, AJCC 7th Edition     Clinical: Stage Unknown (T4, NX, M0) - Unsigned     Pathologic: No stage assigned - Unsigned       Colorectal cancer, stage III   10/04/2014 Tumor Marker CEA 1.0 .  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 mpT4aN2aM0,stage IIIC.    10/10/2014 Surgery partial right hemicolectomy with removal of terminal ileum, sigmoid colectomy, right ureter lysis, (+) residual surgical margin    11/23/2014 - 01/06/2015 Chemotherapy FOLFOX every 2 weeks for 4 cycles    01/23/2015 - 03/02/2015 Radiation Therapy adjuvant radiation   03/27/2015 Progression CT chest, abdomen and pelvis showed new capsular nodules along the posterior margin of hepatic segment 6, measuring 2.1X1.1cm, and then you omental nodule, suspicious for new metastasis.   03/29/2015 -  Chemotherapy Restarted FOLFOX every 2 weeks   OTHER ISSUES: 1. Pulmonary embolism in segment omental branches of the right lung, diagnosed on 11/28/2014 2. Right ureter stent placed on 10/20/2014  CURRENT THERAPY: FOLFOX every 2 weeks, started on 11/23/2014, restarted on 03/29/2015 after radiation   HISTORY OF PRESENTING ILLNESS:  Allison Mitchell 54 y.o. female is here for follow-up after her hospital discharge. She was recently diagnosed with 3 synchronous to colon  cancer, status post surgical resection. I initially saw her when she was diagnosed in the hospital.  She presented with some GI symptoms since last October 2014 at which time she was diagnosed with ischemic colitis. Due to guaiac-positive stools, patient had undergone an outpatient colonoscopy on 09/26/2014, which showed inflamed and ulcerated mucosa in the terminal ileum, and a 1.5 cm mass in the ascending colon and additional 1.5 cm mass in the mid sigmoid colon. All biopsy from the above 3 sites came back positive for invasive moderate differentiated adenocarcinoma, and the sigmoid mass containing squamous cell differentiation.   She was hospitalized for worsening of her symptoms on 10/03/2014.  A CT of the abdomen and pelvis with contrast on 10/02/2014 was remarkable for soft tissue/bowel wall thickening of the right lower pelvis center at the cecum-terminal ileum involving a portion of the adjacent sigmoid colon. This causes small bowel obstruction and obstructed the distal right ureter causing moderate to severe right hydroureteronephrosis. She was transferred [facility] to [facility] for surgical management and further testing. CT of the chest with contrast on 10/03/2014 negative for metastatic disease. MRI of the liver without and without contrast on 10/03/2014 is remarkable for small hepatic cysts, but no findings suspicious for hepatic metastatic disease. Right-sided hydroureteronephrosis was once again seen, with early small bowel obstruction bowel gas pattern.  She underwent right hemicolectomy with primary anastomosis with sigmoid colectomy with end colostomy on Friday, Dec 5, after cystoscopy is performed on 12/4 to rule out GU invasion and a ureteral stent placement.  She has family history of colon cancer in her grandmother and polyps in her sister. Denies risk  factors for HIV or hepatitis. Denies Diabetes. No Tobacco. No ETOH. Denies prior radiation. SHe did consume  significant amount of red meat until recently. We are asked to see the patient in consultation, with recommendations on the oncology standpoint  She is recovering slowly after the surgery. She had the surgical suture opened about 3 weeks ago and was treated for antibiotics , and now much improved. She has no significant pain, but mild abdominal discomfort, no nausea, her stool is getting thicker, she clean her colostomy bag daily. She was seen by Dr. Hulen Skains this morning. She noticed some cloudy urine in the past few days, with mild burning sensation, no fever or back pain. Her appetite is getting better, eats well.  She lost about 25 lbs. No fever or chills. No other new complains.   INTERIM HISTORY: [place] returns for follow-up. She noticed dark stool again in the past 3-4 days, loose, she empties her colostomy bag twice a day. She denies any abdominal pain, nausea, or other new symptoms. She feels well overall. No fever or chills. She is not taking iron pill. She called 2 days ago and we told her to stop Coumadin.   MEDICAL HISTORY:  Past Medical History  Diagnosis Date  . GERD (gastroesophageal reflux disease)   . Depression   . Anxiety   . 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 Peripheral neuropathy due to chemotherapy   . Hydronephrosis, right     malignant--  s/p reimiplant right ureter  . History of pulmonary embolus (PE)     11-28-2014--  due to chemotherapy on coumadin  . Anticoagulated on Coumadin   . Colorectal cancer, stage III oncologist--  dr Stefani Dama    Dx  10-10-2014---Stage IIIC, mpT4aN2aM0, Multifocal synchronous, Moderate differentiated Invasive, Mets to nodes (5 out of 17)/  s/p  Resection tumor cecum, terminal ileum, sigmoid/  currently chemoradiation therapy  . Dysuria   . S/P radiation therapy 03/02/15    completed pelvis/abd/50.4Gy  . Allergy     SEASONAL  . Blood transfusion without reported diagnosis     SURGICAL HISTORY: Past Surgical  History  Procedure Laterality Date  . Cystoscopy with retrograde pyelogram, ureteroscopy and stent placement Right 10/05/2014    Procedure: [place], URETEROSCOPY  AND STENT PLACEMENT;  Surgeon: Alexis Frock, MD;  Location: [facility];  Service: Urology;  Laterality: Right;  . Laparotomy N/A 10/10/2014    Procedure: EXPLORATORY LAPAROTOMY;  Surgeon: Doreen Salvage, MD;  Location: [place];  Service: General;  Laterality: N/A;  . Partial colectomy  10/10/2014    Procedure: PARTIAL SIGMOID COLECTOMY;  Surgeon: Doreen Salvage, MD;  Location: [place];  Service: General;;  . Colostomy  10/10/2014    Procedure: COLOSTOMY;  Surgeon: Doreen Salvage, MD;  Location: [place];  Service: General;;  . Colostomy revision  10/10/2014    Procedure: PARTIAL RIGHT COLECTOMY;  Surgeon: Doreen Salvage, MD;  Location: [place];  Service: General;;  . Ureteral reimplantion Right 10/10/2014    Procedure: OPEN RIGHT URETERAL LYSIS ;  Surgeon: Alexis Frock, MD;  Location: [place];  Service: Urology;  Laterality: Right;  . Portacath placement N/A 10/17/2014    Procedure: INSERTION PORT-A-CATH LEFT SUBCLAVIAN AND MIDLINE INCISION STAPLE REMOVAL ;  Surgeon: Coralie Keens, MD;  Location: [place];  Service: General;  Laterality: N/A;  . Cystoscopy w/ retrogrades Right 01/10/2015    Procedure: CYSTOSCOPY WITH RETROGRADE PYELOGRAM;  Surgeon: Alexis Frock, MD;  Location: [place];  Service: Urology;  Laterality: Right;  . Cystoscopy w/ ureteral stent placement Right 01/10/2015    Procedure: CYSTOSCOPY WITH STENT REPLACEMENT;  Surgeon: Alexis Frock, MD;  Location: [facility];  Service: Urology;  Laterality: Right;    SOCIAL HISTORY: Social History   Social History  . Marital Status: Married    Spouse Name: N/A  . Number of Children: N/A  . Years of Education: N/A   Occupational History  . Not on file.   Social History Main Topics  . Smoking status: Never Smoker   . Smokeless tobacco: Never  Used  . Alcohol Use: No  . Drug Use: No  . Sexual Activity: No   Other Topics Concern  . 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 Cancer Mother     skin cancer   . Diabetes Father   . CAD Father     Onset in his 71s, has 7 stents  . Heart disease Father   . Cancer Maternal Grandmother 9    colon cancer   . Breast cancer Maternal Grandmother   . Cancer Paternal Grandmother 27    breast cancer   . Breast cancer Paternal Grandmother     ALLERGIES:  is allergic to amoxicillin.  MEDICATIONS:  Current Outpatient Prescriptions  Medication Sig Dispense Refill  . citalopram (CELEXA) 20 MG tablet Take 20-40 mg by mouth every morning.   1  . lidocaine-prilocaine (EMLA) cream Apply 1 application topically as needed. 30 g 0  . magnesium hydroxide (MILK OF MAGNESIA) 400 MG/5ML suspension Take 5 mLs by mouth daily as needed for mild constipation.    . ondansetron (ZOFRAN) 8 MG tablet Take 1 tablet (8 mg total) by mouth every 8 (eight) hours as needed for nausea or vomiting. 20 tablet 3  . pantoprazole (PROTONIX) 40 MG tablet Take 1 tablet (40 mg total) by mouth 2 (two) times daily. 60 tablet 1  . PREVIDENT 5000 BOOSTER PLUS 1.1 % PSTE 1 each by Other route daily.   5  . zolpidem (AMBIEN) 5 MG tablet Take 1 tablet (5 mg total) by mouth at bedtime as needed for sleep. 30 tablet 0  . oxyCODONE (OXY IR/ROXICODONE) 5 MG immediate release tablet Take 1 tablet (5 mg total) by mouth every 6 (six) hours as needed for severe pain. (Patient not taking: Reported on 06/28/2015) 30 tablet 0  . potassium chloride SA (K-DUR,KLOR-CON) 20 MEQ tablet Take 1 tablet (20 mEq total) by mouth daily. (Patient not taking: Reported on 06/19/2015) 14 tablet 1  . warfarin (COUMADIN) 5 MG tablet Take 1 tablet (5 mg total) by mouth daily. Take one  tablet (72m) on MWF and half a tablet (2.543m on all other days. (Patient not taking: Reported on 06/15/2015) 30 tablet 3   No current facility-administered medications for this visit.   Facility-Administered Medications Ordered in Other Visits  Medication Dose Route Frequency Provider Last Rate Last Dose  . heparin lock flush 100 unit/mL  500 Units Intravenous Once YaTruitt MerleMD      . sodium chloride 0.9 % injection 10 mL  10 mL Intracatheter PRN YaTruitt MerleMD      . sodium chloride 0.9 % injection 10 mL  10 mL Intravenous PRN YaTruitt Merle  MD   10 mL at 06/28/15 0916    REVIEW OF SYSTEMS:   Constitutional: Denies fevers, chills or abnormal night sweats Eyes: Denies blurriness of vision, double vision or watery eyes Ears, nose, mouth, throat, and face: Denies mucositis or sore throat Respiratory: Denies cough, dyspnea or wheezes Cardiovascular: Denies palpitation, chest discomfort or lower extremity swelling Gastrointestinal:  Denies nausea, heartburn or change in bowel habits Skin: Denies abnormal skin rashes Lymphatics: Denies new lymphadenopathy or easy bruising Neurological:Denies numbness, tingling or new weaknesses Behavioral/Psych: Mood is stable, no new changes  All other systems were reviewed with the patient and are negative.  PHYSICAL EXAMINATION: ECOG PERFORMANCE STATUS: 1 - Symptomatic but completely ambulatory  Filed Vitals:   06/28/15 0931  BP: 142/63  Pulse: 84  Temp: 98.5 F (36.9 C)  Resp: 18   Filed Weights   06/28/15 0931  Weight: 208 lb 14.4 oz (94.756 kg)    GENERAL:alert, no distress and comfortable SKIN: skin color, texture, turgor are normal, no rashes or significant lesions EYES: normal, conjunctiva are pink and non-injected, sclera clear OROPHARYNX:no exudate, no erythema and lips, buccal mucosa, and tongue normal  NECK: supple, thyroid normal size, non-tender, without nodularity LYMPH:  no palpable lymphadenopathy in the cervical, axillary or  inguinal LUNGS: clear to auscultation and percussion with normal breathing effort HEART: regular rate & rhythm and no murmurs and no lower extremity edema ABDOMEN:abdomen soft, non-tender and normal bowel sounds. Positive for colostomy bag on the left, with normal-appearing stool. Surgical wound upper part is well-healed. Musculoskeletal:no cyanosis of digits and no clubbing  PSYCH: alert & oriented x 3 with fluent speech NEURO: no focal motor/sensory deficits  LABORATORY DATA:  CBC Latest Ref Rng 06/28/2015 06/22/2015 06/14/2015  WBC 3.9 - 10.3 10e3/uL 3.7(L) 4.5 7.5  Hemoglobin 11.6 - 15.9 g/dL 8.0(L) 9.3(L) 6.8(LL)  Hematocrit 34.8 - 46.6 % 24.8(L) 29.3(L) 21.6(L)  Platelets 145 - 400 10e3/uL 112(L) 123(L) 100(L)    CMP Latest Ref Rng 06/28/2015 06/22/2015 06/14/2015  Glucose 70 - 140 mg/dl 129 144(H) 154(H)  BUN 7.0 - 26.0 mg/dL 10.2 7.3 7.6  Creatinine 0.6 - 1.1 mg/dL 0.8 0.8 0.8  Sodium 136 - 145 mEq/L 144 143 142  Potassium 3.5 - 5.1 mEq/L 3.3(L) 3.5 3.2(L)  Chloride 96 - 112 mmol/L - - -  CO2 22 - 29 mEq/L _0 Calcium 8.4 - 10.4 mg/dL 8.5 8.6 8.8  Total Protein 6.4 - 8.3 g/dL 5.6(L) 5.7(L) 5.5(L)  Total Bilirubin 0.20 - 1.20 mg/dL 0.59 0.60 0.58  Alkaline Phos 40 - 150 U/L 183(H) 196(H) 219(H)  AST 5 - 34 U/L 23 35(H) 46(H)  ALT 0 - 55 U/L _1 PATHOLOGY REPORTS:  Surgical path 10/10/2014 1. Colon, segmental resection for tumor, Cecum, terminal ileum, sigmoid colon - MULTIFOCAL INVASIVE ADENOCARCINOMA, MODERATELY DIFFERENTIATED, THE LARGEST FOCUS SPANS 4.0 CM. - ADENOCARCINOMA INVOLVES SEROSA. - LYMPHOVASCULAR INVASION IS IDENTIFIED. - METASTATIC CARCINOMA IN 5 OF 17 LYMPH NODES (5/17). - THE PROXIMAL AND DISTAL SURGICAL RESECTION MARGINS ARE NEGATIVE FOR ADENOCARCINOMA. - SEE ONCOLOGY TABLE BELOW. 2. Soft tissue, biopsy, Right periureteral tissue - ADENOCARCINOMA. 3. Soft tissue, biopsy, Right periureteral tissue - INFLAMED AND DEGENERATING FIBROADIPOSE  TISSUE. - THERE IS NO EVIDENCE OF MALIGNANCY. Microscopic Comment 1. COLON AND RECTUM (INCLUDING TRANS-ANAL RESECTION): Specimen: Distal ileum, cecum, proximal ascending colon, and adherent sigmoid colon. Procedure: Resection. Tumor site: Multiple foci, including ileocecal valve, ascending colon and sigmoid. Specimen integrity: Disrupted. Macroscopic intactness of mesorectum:  N/A Macroscopic tumor perforation: Present. Invasive tumor: Maximum size: 4.0 cm. Histologic type(s): Adenocarcinoma. Histologic grade and differentiation: G2: moderately differentiated Type of polyp in which invasive carcinoma arose: Tubulovillous adenomas. Microscopic extension of invasive tumor: Focally involves the serosa. Lymph-Vascular invasion: Present. Peri-neural invasion: Not identified. Tumor deposit(s) (discontinuous extramural extension): Not identified. Resection margins: Proximal margin: 4.0 cm Distal margin: 3.5 cm Circumferential (radial) (posterior ascending, posterior descending; lateral and posterior mid-rectum; and entire lower 1/3 rectum): Cannot be assessed. Mesenteric margin (sigmoid and transverse): Cannot be assessed. Treatment effect (neo-adjuvant therapy): N/A Additional polyp(s): Not identified. Non-neoplastic findings: No significant findings. Lymph nodes: number examined 17; number positive: 5 Pathologic Staging: mpT4a, pN2a, pMX Ancillary studies: A block will be sent for MSI testing by [facility] and MSI testing by PCR and the results reported separately. Comment: There appear to be three synchronous primary tumors in the specimen, one at the ileocecal valve, one in the ascending colon, and one at the sigmoid colon. Each of these foci contain an associated tubulovillous adenoma, suggesting three separate primary sites. The tumor in the sigmoid colon appears to involve the serosa as well. The other two tumors show extension into at least the pericolonic soft tissue. Dr Mali Rund has  reviewed selected slides and concurs that there are likely three synchronous tumors here. The case was discussed with Dr Hulen Skains. Per Dr Hulen Skains, the specimen in #2 is a contiguous piece of tissue 2 of 4 Amended copy Amended FINAL for MICKAYLA, TROUTEN [phone]) Microscopic Comment(continued) with the main tumor, and therefore, not considered a distant metastatic focus. Therefore, the tumor is staged as an MX. (JBK:ecj 10/16/2014) JOSHUA  Mismatch Repair (MMR) Protein Immunohistochemistry (IHC) IHC Expression Result: MLH1: Preserved nuclear expression (greater 50% tumor expression) MSH2: Preserved nuclear expression (greater 50% tumor expression) MSH6: Preserved nuclear expression (greater 50% tumor expression) PMS2: Preserved nuclear expression (greater 50% tumor expression) * Internal control demonstrates intact nuclear expression Interpretation: NORMAL There is preserved expression  Diagnosis 04/16/2015 Liver, needle/core biopsy - METASTATIC ADENOCARCINOMA, SEE COMMENT. Microscopic Comment There are well formed malignant glands with mucin. The morphology combined with the patient's history are consistent with metastatic colorectal carcinoma. Dr. Burr Medico was notified on 04/17/2015. Per request, tissue will be sent for RAS testing.     RADIOGRAPHIC STUDIES: I have personally reviewed the radiological images as listed and agreed with the findings in the report.  CT of chest, abdomen and pelvis 03/27/2015  IMPRESSION: 1. Suspicion for new peritoneal metastatic disease including a 2.1 by 1.1 cm capsular nodule along the posterior margin of hepatic segment 6, and a new small right anterior omental nodule. 2. Several additional hypodense tiny lesions in the liver are stable. 3. Moderate right hydronephrosis and hydroureter with periureteral stranding, despite the presence of the double-J ureteral stent. However, there is no asymmetry in enhancement or excretion of the kidneys. 4.  Prominence of stool in the colon extending to the colostomy site. 5. 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  Abdominal MRI with and without contrast 05/03/2015 IMPRESSION: Irregular peripheral enhancement of the new lesion seen along the posterior capsule of the right liver is highly suspicious for metastatic involvement in this patient with a history of colon cancer. This lesion may be amenable to percutaneous tissue sampling, if clinically warranted.  EGD 06/19/2015 Dr. Carlean Purl  1) 2 areas of linear erosions at [place]  junction - biopsied 2) small hiatal hernia 3) Otherwise normal EGD. Retroflexed views revealed as previously described. The scope was then withdrawn from the patient and the procedure completed. COMPLICATIONS: There were no immediate complications. ENDOSCOPIC IMPRESSION: 1) 2 areas of linear erosions at GE junction - biopsied 2) small hiatal hernia 3) Otherwise normal EGD RECOMMENDATIONS: Restart warfarin 2x NL dose tonight then normal dose tomorrow and ongoing Have INR check next week w/ Dr. Burr Medico stay on pantoprazole  ASSESSMENT & PLAN:  53 year old Caucasian female with minimal past medical history, who was found to have 3 multifocal colon cancer at terminal ileum/cecum, ascending colon and sigmoid colon. All of them has adenocarcinoma,  And the cecum tumor has squamous differentiation. The cecum tumor has directly invaded the soft tissue along the right ureter, which was not completely resected (1% tumor left over, per Dr. Hulen Skains). She has mpT4aN2aM0 stage IIIB, MSI/MMR normal.   1. Multifocal stage IIIB colon cancer, MMR normal, MSI stable, biopsy confirmed liver and probable peritoneal metastases, KRAS mutated  -I discussed her surgical path findings extensively with patient and her  husband. Unfortunately, the tumor was not completely resected. She has very small amount of tumor left over. -Given her residual tumor after surgery, advanced stage III disease, high risk recurrence in the future,  we recommended sequential adjuvant chemotherapy and irradiation -Her restaging scan after radiation unfortunately showed  probable peritoneal metastasis in liver and omentum. Liver biopsy confirmed metastasis.  -I reviewed with the abdominal MRI findings with her. The biopsied liver metastases is stable, there is additional 5 mm lesions on MRI, no significant peritoneal metastasis on the MRI. -I also reviewed her K-ras mutation results with her, she would not benefit from EGFR targeted therapy -She was referred and seen by Dr. Johney Maine to consider surgical debulking and HIPEC, and she will see Dr. Clovis Riley for follow up on 9/7 and possible surgery. I have personally communicated with Dr. Clovis Riley.  -We discussed to add on Avastin if she decides not to pursue surgery -No benefit of EGFR inhibitor giving KRAS mutation (+)  -Her recent EGD shows no evidence of bleeding, we'll resume chemo today. Lab reviewed, slightly worse anemia, mild thrombocytopenia, will proceed chemo.   2. Melena, worsening anemia -Her EGD showed mild esophagitis, no evidence of active bleeding or other lesions. Biopsy was negative  -She restarted Coumadin after the endoscopy, and we held it again due to recurrent possible melena, wil check her stool for OB today   -Continue PPI.   3. PE  -She was on Lovenox injection for a month then switched to oral anticoagulant Coumadin.  -Follow-up with Coumadin clinic.  -Continue Coumadin indefinitely giving the underlying malignancy, on hold for now due to the concern of GI bleeding    4. right ureter stent placement -She will follow up with her urologist Dr. Tresa Moore   -her urinary symptoms likely related to the stent, previous multiple culture were negative. - she is  scheduled to have the stent taken out on September 13, I suggest her to discuss with Dr. Tresa Moore again due to the pending surgery in September or October   5. 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 Her iron study is consistent with iron deficient anemia -This is secondary to the bleeding from her colon cancer. -She received IV Feraheme 578m twice.  -Repeated iron study last week showed ferritin 111, serum iron level normal 49, saturation 12%.    6.G1 peripheral neuropathy, fatigue  -Secondary to chemotherapy, improved  -Continue  close monitoring  Plan, -stool OB x3 today and we will hold coumadin for now, will contact Dr. Carlean Purl if stool OB (+)  -FOLFOX today, no 5-fu bolus  -RTC in 2 weeks    All questions were answered. The patient knows to call the clinic with any problems, questions or concerns.  I spent 20 minutes counseling the patient face to face. The total time spent in the appointment was 25 minutes and more than 50% was on counseling.     Truitt Merle, MD 06/28/2015 10:00 AM

## 2015-06-30 ENCOUNTER — Ambulatory Visit (HOSPITAL_BASED_OUTPATIENT_CLINIC_OR_DEPARTMENT_OTHER): Payer: BLUE CROSS/BLUE SHIELD

## 2015-06-30 VITALS — BP 109/55 | HR 79 | Temp 98.7°F | Resp 18

## 2015-06-30 DIAGNOSIS — C180 Malignant neoplasm of cecum: Secondary | ICD-10-CM

## 2015-06-30 DIAGNOSIS — Z5189 Encounter for other specified aftercare: Secondary | ICD-10-CM | POA: Diagnosis not present

## 2015-06-30 DIAGNOSIS — C19 Malignant neoplasm of rectosigmoid junction: Secondary | ICD-10-CM

## 2015-06-30 MED ORDER — PEGFILGRASTIM INJECTION 6 MG/0.6ML ~~LOC~~
6.0000 mg | PREFILLED_SYRINGE | Freq: Once | SUBCUTANEOUS | Status: AC
  Administered 2015-06-30: 6 mg via SUBCUTANEOUS

## 2015-06-30 MED ORDER — SODIUM CHLORIDE 0.9 % IJ SOLN
10.0000 mL | INTRAMUSCULAR | Status: DC | PRN
  Administered 2015-06-30: 10 mL
  Filled 2015-06-30: qty 10

## 2015-06-30 MED ORDER — HEPARIN SOD (PORK) LOCK FLUSH 100 UNIT/ML IV SOLN
500.0000 [IU] | Freq: Once | INTRAVENOUS | Status: AC | PRN
  Administered 2015-06-30: 500 [IU]
  Filled 2015-06-30: qty 5

## 2015-07-03 ENCOUNTER — Encounter: Payer: Self-pay | Admitting: Hematology

## 2015-07-04 ENCOUNTER — Encounter: Payer: Self-pay | Admitting: Hematology

## 2015-07-06 ENCOUNTER — Ambulatory Visit (AMBULATORY_SURGERY_CENTER): Payer: Self-pay

## 2015-07-06 ENCOUNTER — Ambulatory Visit (HOSPITAL_BASED_OUTPATIENT_CLINIC_OR_DEPARTMENT_OTHER): Payer: BLUE CROSS/BLUE SHIELD | Admitting: Nurse Practitioner

## 2015-07-06 ENCOUNTER — Other Ambulatory Visit: Payer: Self-pay

## 2015-07-06 ENCOUNTER — Other Ambulatory Visit: Payer: Self-pay | Admitting: *Deleted

## 2015-07-06 ENCOUNTER — Ambulatory Visit (HOSPITAL_COMMUNITY)
Admission: RE | Admit: 2015-07-06 | Discharge: 2015-07-06 | Disposition: A | Discharge status: Home / Self Care | Payer: BLUE CROSS/BLUE SHIELD | Source: Ambulatory Visit | Attending: Hematology | Admitting: Hematology

## 2015-07-06 ENCOUNTER — Telehealth: Payer: Self-pay | Admitting: Internal Medicine

## 2015-07-06 ENCOUNTER — Ambulatory Visit (HOSPITAL_BASED_OUTPATIENT_CLINIC_OR_DEPARTMENT_OTHER): Payer: BLUE CROSS/BLUE SHIELD

## 2015-07-06 VITALS — BP 158/58 | HR 98 | Temp 98.2°F | Resp 18 | Ht 68.0 in | Wt 201.8 lb

## 2015-07-06 VITALS — Ht 68.0 in | Wt 201.4 lb

## 2015-07-06 DIAGNOSIS — E876 Hypokalemia: Secondary | ICD-10-CM | POA: Diagnosis not present

## 2015-07-06 DIAGNOSIS — R5383 Other fatigue: Secondary | ICD-10-CM

## 2015-07-06 DIAGNOSIS — R195 Other fecal abnormalities: Secondary | ICD-10-CM | POA: Diagnosis not present

## 2015-07-06 DIAGNOSIS — C180 Malignant neoplasm of cecum: Secondary | ICD-10-CM

## 2015-07-06 DIAGNOSIS — C182 Malignant neoplasm of ascending colon: Secondary | ICD-10-CM

## 2015-07-06 DIAGNOSIS — D62 Acute posthemorrhagic anemia: Secondary | ICD-10-CM | POA: Insufficient documentation

## 2015-07-06 DIAGNOSIS — C19 Malignant neoplasm of rectosigmoid junction: Secondary | ICD-10-CM

## 2015-07-06 DIAGNOSIS — K921 Melena: Secondary | ICD-10-CM

## 2015-07-06 DIAGNOSIS — R197 Diarrhea, unspecified: Secondary | ICD-10-CM

## 2015-07-06 DIAGNOSIS — K625 Hemorrhage of anus and rectum: Secondary | ICD-10-CM

## 2015-07-06 DIAGNOSIS — Z7901 Long term (current) use of anticoagulants: Secondary | ICD-10-CM

## 2015-07-06 DIAGNOSIS — C187 Malignant neoplasm of sigmoid colon: Secondary | ICD-10-CM

## 2015-07-06 DIAGNOSIS — C787 Secondary malignant neoplasm of liver and intrahepatic bile duct: Secondary | ICD-10-CM

## 2015-07-06 LAB — CBC WITH DIFFERENTIAL/PLATELET
BASO%: 0.3 % (ref 0.0–2.0)
BASOS ABS: 0.1 10*3/uL (ref 0.0–0.1)
EOS%: 1 % (ref 0.0–7.0)
Eosinophils Absolute: 0.2 10*3/uL (ref 0.0–0.5)
HCT: 22 % — ABNORMAL LOW (ref 34.8–46.6)
HEMOGLOBIN: 6.8 g/dL — AB (ref 11.6–15.9)
LYMPH%: 3.8 % — ABNORMAL LOW (ref 14.0–49.7)
MCH: 27.4 pg (ref 25.1–34.0)
MCHC: 30.8 g/dL — AB (ref 31.5–36.0)
MCV: 89 fL (ref 79.5–101.0)
MONO#: 1.2 10*3/uL — ABNORMAL HIGH (ref 0.1–0.9)
MONO%: 6.6 % (ref 0.0–14.0)
NEUT#: 16.1 10*3/uL — ABNORMAL HIGH (ref 1.5–6.5)
NEUT%: 88.3 % — ABNORMAL HIGH (ref 38.4–76.8)
Platelets: 96 10*3/uL — ABNORMAL LOW (ref 145–400)
RBC: 2.48 10*6/uL — ABNORMAL LOW (ref 3.70–5.45)
RDW: 23 % — AB (ref 11.2–14.5)
WBC: 18.2 10*3/uL — ABNORMAL HIGH (ref 3.9–10.3)
lymph#: 0.7 10*3/uL — ABNORMAL LOW (ref 0.9–3.3)

## 2015-07-06 LAB — COMPREHENSIVE METABOLIC PANEL (CC13)
ALBUMIN: 3 g/dL — AB (ref 3.5–5.0)
ALT: 13 U/L (ref 0–55)
AST: 25 U/L (ref 5–34)
Alkaline Phosphatase: 234 U/L — ABNORMAL HIGH (ref 40–150)
Anion Gap: 10 mEq/L (ref 3–11)
BUN: 10.6 mg/dL (ref 7.0–26.0)
CHLORIDE: 110 meq/L — AB (ref 98–109)
CO2: 23 mEq/L (ref 22–29)
Calcium: 8.6 mg/dL (ref 8.4–10.4)
Creatinine: 1 mg/dL (ref 0.6–1.1)
EGFR: 64 mL/min/{1.73_m2} — ABNORMAL LOW (ref >90)
GLUCOSE: 125 mg/dL (ref 70–140)
POTASSIUM: 3.1 meq/L — AB (ref 3.5–5.1)
SODIUM: 143 meq/L (ref 136–145)
Total Bilirubin: 0.49 mg/dL (ref 0.20–1.20)
Total Protein: 5.6 g/dL — ABNORMAL LOW (ref 6.4–8.3)

## 2015-07-06 LAB — PREPARE RBC (CROSSMATCH)

## 2015-07-06 NOTE — Progress Notes (Signed)
Has ostomy, emptied it 6 times on Wed. Emptied it 4 times yesterday, baseline is twice daily at the most

## 2015-07-06 NOTE — Telephone Encounter (Signed)
Having decreasing Hgb and rectal bleeding ? Melena Warfarin being held  I told Dr. Burr Medico we could do a colonoscopy Tues 0730 vs Thurs 4 PM  Please call patient on cell or call cancer center and find her to see if she can come for a previsit today  Thanks

## 2015-07-06 NOTE — Telephone Encounter (Signed)
Pt coming for previsit today, scheduled for colon in the [place] 07/10/15@7 :30am.

## 2015-07-06 NOTE — Progress Notes (Signed)
No allergies to eggs or soy No past problems with anesthesia No diet/weight loss meds No home oxygen  Refused emmi 

## 2015-07-07 ENCOUNTER — Ambulatory Visit (HOSPITAL_BASED_OUTPATIENT_CLINIC_OR_DEPARTMENT_OTHER): Payer: BLUE CROSS/BLUE SHIELD

## 2015-07-07 VITALS — BP 129/57 | HR 73 | Temp 98.1°F | Resp 16

## 2015-07-07 DIAGNOSIS — D62 Acute posthemorrhagic anemia: Secondary | ICD-10-CM | POA: Diagnosis not present

## 2015-07-07 MED ORDER — ACETAMINOPHEN 325 MG PO TABS
ORAL_TABLET | ORAL | Status: AC
  Filled 2015-07-07: qty 2

## 2015-07-07 MED ORDER — DIPHENHYDRAMINE HCL 25 MG PO CAPS
25.0000 mg | ORAL_CAPSULE | Freq: Once | ORAL | Status: AC
  Administered 2015-07-07: 25 mg via ORAL

## 2015-07-07 MED ORDER — ACETAMINOPHEN 325 MG PO TABS
650.0000 mg | ORAL_TABLET | Freq: Once | ORAL | Status: AC
  Administered 2015-07-07: 650 mg via ORAL

## 2015-07-07 MED ORDER — DIPHENHYDRAMINE HCL 25 MG PO CAPS
ORAL_CAPSULE | ORAL | Status: AC
  Filled 2015-07-07: qty 1

## 2015-07-07 MED ORDER — HEPARIN SOD (PORK) LOCK FLUSH 100 UNIT/ML IV SOLN
500.0000 [IU] | Freq: Every day | INTRAVENOUS | Status: AC | PRN
  Administered 2015-07-07: 500 [IU]
  Filled 2015-07-07: qty 5

## 2015-07-07 MED ORDER — SODIUM CHLORIDE 0.9 % IV SOLN
250.0000 mL | Freq: Once | INTRAVENOUS | Status: AC
  Administered 2015-07-07: 250 mL via INTRAVENOUS

## 2015-07-07 MED ORDER — SODIUM CHLORIDE 0.9 % IJ SOLN
10.0000 mL | INTRAMUSCULAR | Status: AC | PRN
  Administered 2015-07-07: 10 mL
  Filled 2015-07-07: qty 10

## 2015-07-07 NOTE — Patient Instructions (Signed)

## 2015-07-08 ENCOUNTER — Encounter: Payer: Self-pay | Admitting: Nurse Practitioner

## 2015-07-08 DIAGNOSIS — R197 Diarrhea, unspecified: Secondary | ICD-10-CM | POA: Insufficient documentation

## 2015-07-08 DIAGNOSIS — Z7901 Long term (current) use of anticoagulants: Secondary | ICD-10-CM | POA: Insufficient documentation

## 2015-07-08 DIAGNOSIS — E876 Hypokalemia: Secondary | ICD-10-CM | POA: Insufficient documentation

## 2015-07-08 LAB — TYPE AND SCREEN
ABO/RH(D): A POS
ANTIBODY SCREEN: NEGATIVE
UNIT DIVISION: 0
Unit division: 0

## 2015-07-08 NOTE — Assessment & Plan Note (Signed)
Potassium is down to 3.1.  Patient states that she has been taking the potassium 20 mEq once daily as directed.  Advised patient to increase the potassium to 20 mEq twice daily.

## 2015-07-08 NOTE — Assessment & Plan Note (Signed)
Patient reports that the stool output from her colostomy has been entirely liquid for the past few days.  She is also complaining of some cramping abdominal pain as well.  She denies any nausea or vomiting.  She denies any recent fevers or chills.  Stool in colostomy bag was completely liquid on exam.  Stool is also Hemoccult-positive when checked.  Hemoglobin today was 6.8.  Patient does report some mild increased fatigue and some shortness of breath with exertion.  Dr.Feng has contacted patient's gastroenterologist; and the plan is for the patient to go directly to his office following the cancer Center visit to obtain colon prep for planned colonoscopy for further evaluation and management.  Plan is for the patient to undergo the colonoscopy either Tuesday or Thursday of next week.  Patient plans to return first thing in the morning 07/07/2015 to receive 2 units packed red blood cells transfusion.

## 2015-07-08 NOTE — Progress Notes (Signed)
SYMPTOM MANAGEMENT CLINIC   HPI: Allison Mitchell 54 y.o. female diagnosed with colorectal cancer.  Patient is status post colostomy, chemotherapy therapy, and radiation therapy.  Currently undergoing FOLFOX chemotherapy regimen.   Patient reports that the stool output from her colostomy has been entirely liquid for the past few days.  She is also complaining of some cramping abdominal pain as well.  She denies any nausea or vomiting.  She denies any recent fevers or chills.  Stool in colostomy bag was completely liquid on exam.  Stool is also Hemoccult-positive when checked.  Hemoglobin today was 6.8.  Patient does report some mild increased fatigue and some shortness of breath with exertion.  HPI  ROS  Past Medical History  Diagnosis Date  . GERD (gastroesophageal reflux disease)   . Depression   . Anxiety   . 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 Peripheral neuropathy due to chemotherapy   . Hydronephrosis, right     malignant--  s/p reimiplant right ureter  . History of pulmonary embolus (PE)     11-28-2014--  due to chemotherapy on coumadin  . Anticoagulated on Coumadin   . Colorectal cancer, stage III oncologist--  dr Stefani Dama    Dx  10-10-2014---Stage IIIC, [id number], Multifocal synchronous, Moderate differentiated Invasive, Mets to nodes (5 out of 17)/  s/p  Resection tumor cecum, terminal ileum, sigmoid/  currently chemoradiation therapy  . Dysuria   . S/P radiation therapy 03/02/15    completed pelvis/abd/50.4Gy  . Allergy     SEASONAL  . Blood transfusion without reported diagnosis     Past Surgical History  Procedure Laterality Date  . Cystoscopy with retrograde pyelogram, ureteroscopy and stent placement Right 10/05/2014    Procedure: [place], URETEROSCOPY  AND STENT PLACEMENT;  Surgeon: Alexis Frock, MD;  Location: [facility];  Service: Urology;  Laterality: Right;  . Laparotomy N/A 10/10/2014    Procedure:  EXPLORATORY LAPAROTOMY;  Surgeon: Doreen Salvage, MD;  Location: [place];  Service: General;  Laterality: N/A;  . Partial colectomy  10/10/2014    Procedure: PARTIAL SIGMOID COLECTOMY;  Surgeon: Doreen Salvage, MD;  Location: [place];  Service: General;;  . Colostomy  10/10/2014    Procedure: COLOSTOMY;  Surgeon: Doreen Salvage, MD;  Location: [place];  Service: General;;  . Colostomy revision  10/10/2014    Procedure: PARTIAL RIGHT COLECTOMY;  Surgeon: Doreen Salvage, MD;  Location: [place];  Service: General;;  . Ureteral reimplantion Right 10/10/2014    Procedure: OPEN RIGHT URETERAL LYSIS ;  Surgeon: Alexis Frock, MD;  Location: [place];  Service: Urology;  Laterality: Right;  . Portacath placement N/A 10/17/2014    Procedure: INSERTION PORT-A-CATH LEFT SUBCLAVIAN AND MIDLINE INCISION STAPLE REMOVAL ;  Surgeon: Coralie Keens, MD;  Location: [place];  Service: General;  Laterality: N/A;  . Cystoscopy w/ retrogrades Right 01/10/2015    Procedure: CYSTOSCOPY WITH RETROGRADE PYELOGRAM;  Surgeon: Alexis Frock, MD;  Location: [facility];  Service: Urology;  Laterality: Right;  . Cystoscopy w/ ureteral stent placement Right 01/10/2015    Procedure: CYSTOSCOPY WITH STENT REPLACEMENT;  Surgeon: Alexis Frock, MD;  Location: [facility];  Service: Urology;  Laterality: Right;    has Partial bowel obstruction; Bowel obstruction; Colorectal cancer, stage III; Anemia; Chest pain; Pulmonary emboli; Right low back pain; Black stool; Chronic anticoagulation; Long term current use of anticoagulant therapy; Hypokalemia; Acute blood loss anemia; and Diarrhea on her problem  list.    is allergic to amoxicillin.    Medication List       This list is accurate as of: 07/06/15 11:59 PM.  Always use your most recent med list.               citalopram 20 MG tablet  Commonly known as:  CELEXA  Take 20-40 mg by mouth every morning.     lidocaine-prilocaine cream  Commonly known as:  EMLA  Apply 1  application topically as needed.     magnesium hydroxide 400 MG/5ML suspension  Commonly known as:  MILK OF MAGNESIA  Take 5 mLs by mouth daily as needed for mild constipation.     ondansetron 8 MG tablet  Commonly known as:  ZOFRAN  Take 1 tablet (8 mg total) by mouth every 8 (eight) hours as needed for nausea or vomiting.     oxyCODONE 5 MG immediate release tablet  Commonly known as:  Oxy IR/ROXICODONE  Take 1 tablet (5 mg total) by mouth every 6 (six) hours as needed for severe pain.     pantoprazole 40 MG tablet  Commonly known as:  PROTONIX  Take 1 tablet (40 mg total) by mouth 2 (two) times daily.     potassium chloride SA 20 MEQ tablet  Commonly known as:  K-DUR,KLOR-CON  Take 1 tablet (20 mEq total) by mouth daily. Take as directed.     PREVIDENT 5000 BOOSTER PLUS 1.1 % Pste  Generic drug:  Sodium Fluoride  1 each by Other route daily.     zolpidem 5 MG tablet  Commonly known as:  AMBIEN  Take 1 tablet (5 mg total) by mouth at bedtime as needed for sleep.         PHYSICAL EXAMINATION  Oncology Vitals 07/07/2015 07/07/2015 07/07/2015 07/07/2015 07/07/2015 07/06/2015 07/06/2015  Height - - - - - 173 cm 173 cm  Weight - - - - - 91.354 kg 91.536 kg  Weight (lbs) - - - - - 201 lbs 6 oz 201 lbs 13 oz  BMI (kg/m2) - - - - - 30.62 kg/m2 30.68 kg/m2  Temp 98.1 98 97 98 98.6 - 98.2  Pulse 73 73 76 77 77 - 98  Resp 16 16 16 16 18  - 18  SpO2 100 100 100 100 98 - 100  BSA (m2) - - - - - 2.09 m2 2.1 m2   BP Readings from Last 3 Encounters:  07/07/15 129/57  07/06/15 158/58  06/30/15 109/55    Physical Exam  Constitutional: She is oriented to person, place, and time.  Patient appears fatigued, weak, frail, chronically ill.  HENT:  Head: Normocephalic and atraumatic.  Mouth/Throat: Oropharynx is clear and moist.  Eyes: Conjunctivae and EOM are normal. Pupils are equal, round, and reactive to light. Right eye exhibits no discharge. Left eye exhibits no discharge. No scleral  icterus.  Neck: Normal range of motion. Neck supple. No JVD present. No tracheal deviation present. No thyromegaly present.  Cardiovascular: Normal rate, regular rhythm, normal heart sounds and intact distal pulses.   Pulmonary/Chest: Effort normal and breath sounds normal. No respiratory distress. She has no wheezes. She has no rales. She exhibits no tenderness.  Abdominal: Soft. Bowel sounds are normal. She exhibits no distension and no mass. There is no tenderness. There is no rebound and no guarding.   Left abdominal colostomy intact with pink stoma.  Musculoskeletal: Normal range of motion. She exhibits no edema or tenderness.  Lymphadenopathy:  She has no cervical adenopathy.  Neurological: She is alert and oriented to person, place, and time. Gait normal.  Skin: Skin is warm and dry. No rash noted. No erythema. There is pallor.  Psychiatric: Affect normal.  Nursing note and vitals reviewed.   LABORATORY DATA:. Appointment on 07/06/2015  Component Date Value Ref Range Status  . WBC 07/06/2015 18.2* 3.9 - 10.3 10e3/uL Final  . NEUT# 07/06/2015 16.1* 1.5 - 6.5 10e3/uL Final  . HGB 07/06/2015 6.8* 11.6 - 15.9 g/dL Final  . HCT 07/06/2015 22.0* 34.8 - 46.6 % Final  . Platelets 07/06/2015 96* 145 - 400 10e3/uL Final  . MCV 07/06/2015 89.0  79.5 - 101.0 fL Final  . MCH 07/06/2015 27.4  25.1 - 34.0 pg Final  . MCHC 07/06/2015 30.8* 31.5 - 36.0 g/dL Final  . RBC 07/06/2015 2.48* 3.70 - 5.45 10e6/uL Final  . RDW 07/06/2015 23.0* 11.2 - 14.5 % Final  . lymph# 07/06/2015 0.7* 0.9 - 3.3 10e3/uL Final  . MONO# 07/06/2015 1.2* 0.1 - 0.9 10e3/uL Final  . Eosinophils Absolute 07/06/2015 0.2  0.0 - 0.5 10e3/uL Final  . Basophils Absolute 07/06/2015 0.1  0.0 - 0.1 10e3/uL Final  . NEUT% 07/06/2015 88.3* 38.4 - 76.8 % Final  . LYMPH% 07/06/2015 3.8* 14.0 - 49.7 % Final  . MONO% 07/06/2015 6.6  0.0 - 14.0 % Final  . EOS% 07/06/2015 1.0  0.0 - 7.0 % Final  . BASO% 07/06/2015 0.3  0.0 - 2.0 %  Final  . Sodium 07/06/2015 143  136 - 145 mEq/L Final  . Potassium 07/06/2015 3.1* 3.5 - 5.1 mEq/L Final  . Chloride 07/06/2015 110* 98 - 109 mEq/L Final  . CO2 07/06/2015 23  22 - 29 mEq/L Final  . Glucose 07/06/2015 125  70 - 140 mg/dl Final  . BUN 07/06/2015 10.6  7.0 - 26.0 mg/dL Final  . Creatinine 07/06/2015 1.0  0.6 - 1.1 mg/dL Final  . Total Bilirubin 07/06/2015 0.49  0.20 - 1.20 mg/dL Final  . Alkaline Phosphatase 07/06/2015 234* 40 - 150 U/L Final  . AST 07/06/2015 25  5 - 34 U/L Final  . ALT 07/06/2015 13  0 - 55 U/L Final  . Total Protein 07/06/2015 5.6* 6.4 - 8.3 g/dL Final  . Albumin 07/06/2015 3.0* 3.5 - 5.0 g/dL Final  . Calcium 07/06/2015 8.6  8.4 - 10.4 mg/dL Final  . Anion Gap 07/06/2015 10  3 - 11 mEq/L Final  . EGFR 07/06/2015 64* >90 ml/min/1.73 m2 Final   eGFR is calculated using the CKD-EPI Creatinine Equation (2009)  Hospital Outpatient Visit on 07/06/2015  Component Date Value Ref Range Status  . ABO/RH(D) 07/06/2015 A POS   Final  . Antibody Screen 07/06/2015 NEG   Final  . Sample Expiration 07/06/2015 07/09/2015   Final  . Unit Number 07/06/2015 [id number]   Final  . Blood Component Type 07/06/2015 RED CELLS,LR   Final  . Unit division 07/06/2015 00   Final  . Status of Unit 07/06/2015 ISSUED,FINAL   Final  . Transfusion Status 07/06/2015 OK TO TRANSFUSE   Final  . Crossmatch Result 07/06/2015 Compatible   Final  . Unit Number 07/06/2015 [id number]   Final  . Blood Component Type 07/06/2015 RED CELLS,LR   Final  . Unit division 07/06/2015 00   Final  . Status of Unit 07/06/2015 ISSUED,FINAL   Final  . Transfusion Status 07/06/2015 OK TO TRANSFUSE   Final  . Crossmatch Result 07/06/2015 Compatible   Final  .  Order Confirmation 07/06/2015 ORDER PROCESSED BY BLOOD BANK   Final     RADIOGRAPHIC STUDIES: No results found.  ASSESSMENT/PLAN:    Acute blood loss anemia Patient reports that the stool output from her colostomy has been entirely  liquid for the past few days.  She is also complaining of some cramping abdominal pain as well.  She denies any nausea or vomiting.  She denies any recent fevers or chills.  Stool in colostomy bag was completely liquid on exam.  Stool is also Hemoccult-positive when checked.  Hemoglobin today was 6.8.  Patient does report some mild increased fatigue and some shortness of breath with exertion.  Dr.Feng has contacted patient's gastroenterologist; and the plan is for the patient to go directly to his office following the cancer Center visit to obtain colon prep for planned colonoscopy for further evaluation and management.  Plan is for the patient to undergo the colonoscopy either Tuesday or Thursday of next week.  Patient plans to return first thing in the morning 07/07/2015 to receive 2 units packed red blood cells transfusion.  Colorectal cancer, stage III Patient received cycle 10 of her FOLFOX chemotherapy on 06/28/2015.  Patient received Neulasta injection following her pump discontinuation.  Blood counts obtained today reveal a WBC 18.2, ANC 16.1, hemoglobin 6.8, and platelet count of 96.   Stool and patient's colostomy bag was Hemoccult-positive today; with hemoglobin 6.8.  Patient will receive 2 units packed red blood cells transfusion Saturday, 07/07/2015.  She plans to undergo a colonoscopy for further evaluation and management.  Later this week per her gastroenterologist.  Patient will return on 07/12/2015 for labs, physical, and her next cycle of chemotherapy.  Diarrhea Patient reports liquid stool in her colostomy multiple times within the past 2-3 days; with associated abdominal cramping.  Patient has not taken but a few Imodium; but states it is been ineffective so far.  Patient denies any recent fevers or chills.  Stool when checked was Hemoccult-positive.  Hemoglobin down to 6.8.  Patient will receive blood transfusion tomorrow.  Patient is scheduled for colonoscopy later  this week.  Per gastroenterologist for further evaluation and management.  Hypokalemia Potassium is down to 3.1.  Patient states that she has been taking the potassium 20 mEq once daily as directed.  Advised patient to increase the potassium to 20 mEq twice daily.  Long term current use of anticoagulant therapy Patient has been diagnosed in the past with a pulmonary embolism; and continues to take Coumadin as directed.  Patient stated understanding of all instructions; and was in agreement with this plan of care. The patient knows to call the clinic with any problems, questions or concerns.   Review/collaboration with Dr. Burr Medico regarding all aspects of patient's visit today.   Total time spent with patient was 25 minutes;  with greater than 75 percent of that time spent in face to face counseling regarding patient's symptoms,  and coordination of care and follow up.  Disclaimer:This dictation was prepared with Dragon/digital dictation along with [employer]. Any transcriptional errors that result from this process are unintentional.  Drue Second, NP 07/08/2015

## 2015-07-08 NOTE — Assessment & Plan Note (Addendum)
Patient received cycle 10 of her FOLFOX chemotherapy on 06/28/2015.  Patient received Neulasta injection following her pump discontinuation.  Blood counts obtained today reveal a WBC 18.2, ANC 16.1, hemoglobin 6.8, and platelet count of 96.   Stool and patient's colostomy bag was Hemoccult-positive today; with hemoglobin 6.8.  Patient will receive 2 units packed red blood cells transfusion Saturday, 07/07/2015.  She plans to undergo a colonoscopy for further evaluation and management.  Later this week per her gastroenterologist.  Patient will return on 07/12/2015 for labs, physical, and her next cycle of chemotherapy.

## 2015-07-08 NOTE — Assessment & Plan Note (Signed)
Patient has been diagnosed in the past with a pulmonary embolism; and continues to take Coumadin as directed.

## 2015-07-08 NOTE — Assessment & Plan Note (Signed)
Patient reports liquid stool in her colostomy multiple times within the past 2-3 days; with associated abdominal cramping.  Patient has not taken but a few Imodium; but states it is been ineffective so far.  Patient denies any recent fevers or chills.  Stool when checked was Hemoccult-positive.  Hemoglobin down to 6.8.  Patient will receive blood transfusion tomorrow.  Patient is scheduled for colonoscopy later this week.  Per gastroenterologist for further evaluation and management.

## 2015-07-10 ENCOUNTER — Ambulatory Visit (AMBULATORY_SURGERY_CENTER): Payer: BLUE CROSS/BLUE SHIELD | Admitting: Internal Medicine

## 2015-07-10 ENCOUNTER — Encounter: Payer: Self-pay | Admitting: Internal Medicine

## 2015-07-10 VITALS — BP 93/65 | HR 80 | Temp 99.0°F | Resp 28 | Ht 68.0 in | Wt 201.0 lb

## 2015-07-10 DIAGNOSIS — K921 Melena: Secondary | ICD-10-CM

## 2015-07-10 MED ORDER — SODIUM CHLORIDE 0.9 % IV SOLN: 500.0000 mL | INTRAVENOUS | Status: DC

## 2015-07-10 NOTE — Addendum Note (Signed)
Addended by: Adalberto Cole on: 07/10/2015 09:54 AM   Modules accepted: Orders

## 2015-07-10 NOTE — Op Note (Signed)
[facility] & Allison Mitchell. [place], [address]   COLONOSCOPY PROCEDURE REPORT  PATIENT: Allison Mitchell, Allison Mitchell  MR#: [id number] BIRTHDATE: 01-May-1961 , 65  yrs. old GENDER: female ENDOSCOPIST: Gatha Mayer, MD, [facility] PROCEDURE DATE:  07/10/2015 PROCEDURE:   Colonoscopy, diagnostic - vis stoma First Screening Colonoscopy - Avg.  risk and is 50 yrs.  old or older - No.  Prior Negative Screening - Now for repeat screening. N/A  History of Adenoma - Now for follow-up colonoscopy & has been > or = to 3 yrs.  N/A  Polyps removed today? No Recommend repeat exam, <10 yrs? No ASA CLASS:   Class II INDICATIONS:Evaluation of unexplained GI bleeding and Patient is not applicable for Colorectal Neoplasm Risk Assessment for this procedure. MEDICATIONS: Propofol 200 mg IV and Monitored anesthesia care  DESCRIPTION OF PROCEDURE:   After the risks benefits and alternatives of the procedure were thoroughly explained, informed consent was obtained.  The digital rectal exam was not performed. The [id number]  endoscope was introduced through the sigmoid colostomy and advanced to the surgical anastomosis. No adverse events experienced.   The quality of the prep was excellent.  (MiraLax was used)  The instrument was then slowly withdrawn as the colon was fully examined. Estimated blood loss is zero unless otherwise noted in this procedure report.      COLON FINDINGS: There was evidence of a prior end-to-side ileocolonic surgical anastomosis in the right colon.   The colonic mucosa appeared normal throughout the entire examined colon. Retroflexion was not performed. The time to cecum = 3.5 Withdrawal time = 7.1   The scope was withdrawn and the procedure completed. COMPLICATIONS: There were no immediate complications.  ENDOSCOPIC IMPRESSION: 1.   There was evidence of a prior ileocolonic surgical anastomosis in the right colon and a sigmoid colostomy 2.   The colonic mucosa  appeared normal throughout the entire examined colon  RECOMMENDATIONS: Cause of bleeding not clear, nothing seen here- had esophagitis on EGD. will discuss furher eval with Dr.  Burr Medico  eSigned:  Gatha Mayer, MD, [facility] 07/10/2015 8:04 AM   cc: Allison Merle, MD and The Patient

## 2015-07-10 NOTE — Progress Notes (Signed)
Transferred to recovery room. A/O x3, pleased with MAC.  VSS.  Report to Celia, RN. 

## 2015-07-10 NOTE — Patient Instructions (Addendum)
This exam was normal - with post-operative changes.  I did not see a cause of bleeding.  I will discuss w/ Dr. Burr Medico and we will let you any next steps.  I appreciate the opportunity to care for you. Gatha Mayer, MD, [facility]   Discharge instructions given. Normal exam. Resume previous medications. YOU HAD AN ENDOSCOPIC PROCEDURE TODAY AT [facility]:   Refer to the procedure report that was given to you for any specific questions about what was found during the examination.  If the procedure report does not answer your questions, please call your gastroenterologist to clarify.  If you requested that your care partner not be given the details of your procedure findings, then the procedure report has been included in a sealed envelope for you to review at your convenience later.  YOU SHOULD EXPECT: Some feelings of bloating in the abdomen. Passage of more gas than usual.  Walking can help get rid of the air that was put into your GI tract during the procedure and reduce the bloating. If you had a lower endoscopy (such as a colonoscopy or flexible sigmoidoscopy) you may notice spotting of blood in your stool or on the toilet paper. If you underwent a bowel prep for your procedure, you may not have a normal bowel movement for a few days.  Please Note:  You might notice some irritation and congestion in your nose or some drainage.  This is from the oxygen used during your procedure.  There is no need for concern and it should clear up in a day or so.  SYMPTOMS TO REPORT IMMEDIATELY:   Following lower endoscopy (colonoscopy or flexible sigmoidoscopy):  Excessive amounts of blood in the stool  Significant tenderness or worsening of abdominal pains  Swelling of the abdomen that is new, acute  Fever of 100F or higher   For urgent or emergent issues, a gastroenterologist can be reached at any hour by calling [phone].   DIET: Your first meal following the  procedure should be a small meal and then it is ok to progress to your normal diet. Heavy or fried foods are harder to digest and may make you feel nauseous or bloated.  Likewise, meals heavy in dairy and vegetables can increase bloating.  Drink plenty of fluids but you should avoid alcoholic beverages for 24 hours.  ACTIVITY:  You should plan to take it easy for the rest of today and you should NOT DRIVE or use heavy machinery until tomorrow (because of the sedation medicines used during the test).    FOLLOW UP: Our staff will call the number listed on your records the next business day following your procedure to check on you and address any questions or concerns that you may have regarding the information given to you following your procedure. If we do not reach you, we will leave a message.  However, if you are feeling well and you are not experiencing any problems, there is no need to return our call.  We will assume that you have returned to your regular daily activities without incident.  If any biopsies were taken you will be contacted by phone or by letter within the next 1-3 weeks.  Please call us at [phone] if you have not heard about the biopsies in 3 weeks.    SIGNATURES/CONFIDENTIALITY: You and/or your care partner have signed paperwork which will be entered into your electronic medical record.  These signatures attest to the fact  that that the information above on your After Visit Summary has been reviewed and is understood.  Full responsibility of the confidentiality of this discharge information lies with you and/or your care-partner.

## 2015-07-11 ENCOUNTER — Other Ambulatory Visit: Payer: Self-pay | Admitting: *Deleted

## 2015-07-11 ENCOUNTER — Telehealth: Payer: Self-pay | Admitting: *Deleted

## 2015-07-11 DIAGNOSIS — J90 Pleural effusion, not elsewhere classified: Secondary | ICD-10-CM

## 2015-07-11 DIAGNOSIS — T451X5A Adverse effect of antineoplastic and immunosuppressive drugs, initial encounter: Secondary | ICD-10-CM

## 2015-07-11 DIAGNOSIS — D6481 Anemia due to antineoplastic chemotherapy: Secondary | ICD-10-CM

## 2015-07-11 NOTE — Telephone Encounter (Signed)
Patient husband is wanting to know what the ED will do for patient if they do go. He doesn't want patient to have the same endoscopy as last week??  Best # [phone].

## 2015-07-11 NOTE — Telephone Encounter (Signed)
Am ccing Oretha Caprice on this as he is covering the hospital - I will not be able to add on any procedure tomorrow but we might be able to add it on at the hosppital - cannot guarantee

## 2015-07-11 NOTE — Telephone Encounter (Signed)
I spoke with her husband, it sounds her bleeding has stopped today. I told her to be NPO after midnight today, just in case she needs endoscopy for bleeding. I will check her CBC in the morning, will text you the result, I am seeing her at 10:45am.   It will be great if you can add her on tomorrow for push endoscopy, I am just worried that the bleeding will recur frequently if we do not find the source.   I am contacting IR to see if they can get her in for thoracentesis.  I will probably change her chemo regimen.   She is off coumadin, I told her do not use any NSAIDs.   Thanks.  Krista Blue

## 2015-07-11 NOTE — Telephone Encounter (Signed)
Dr. Carlean Purl May the patient see the surgical oncologist prior to ED visit. Apparently has had this appointment scheduled for a while. Also does she need to be NPO? Santiago Glad

## 2015-07-11 NOTE — Telephone Encounter (Signed)
Received call from husband Lewis re:  Pt saw Dr. Clovis Riley today and was informed that pt has pleural effusion.  Per Bobby Rumpf, Dr. Clovis Riley would like for pt to have thoracentesis, and fluids to be sent for cytology studies to see if  HIPEC would be appropriate for pt.   Lewis would like for pt to have thoracentesis done on 9/8 when pt comes for office visit with Dr. Burr Medico. Lewis   Phone    [phone].

## 2015-07-11 NOTE — Telephone Encounter (Signed)
Called patient. Patient is in [place] now with appointment starting with Dr. Clovis Riley for lab work at 0900. Patient denies any symptoms, dizziness etc. Patient has already eaten breakfast, but will stay on clear liquids today. Patient informed to stop any intake for 2 hours prior to ED visit. Patient will call Dr. Celesta Aver office with approximate time of arrival at ED.

## 2015-07-11 NOTE — Telephone Encounter (Signed)
That is her choice - as long as she is not feeling faint, etc  I think she is going to [facility] today....for that appt.

## 2015-07-11 NOTE — Telephone Encounter (Signed)
Received call from husband Lewis wanting to update Dr. Burr Medico re:  Pt was instructed by Dr. Carlean Purl to go to [facility] ER after appt with Dr. Clovis Riley at [facility] today -  For  EGD due to pt is bleeding again.   Lewis  Phone      [phone].

## 2015-07-11 NOTE — Telephone Encounter (Signed)
Pt is scheduled for blood transfusion at [place] at 1130 am  Thursday  07/12/15 Thoracentesis with fluids for cytology studies at  [facility] at  1015 am   Friday  07/13/15.

## 2015-07-11 NOTE — Telephone Encounter (Signed)
She needs to go to [facility] ED for further evaluation - will probably need to have an EGD again to try to see what is bleeding - colonoscopy just inspection only so do not think from procedure

## 2015-07-11 NOTE — Telephone Encounter (Signed)
  Follow up Call-  Call back number 07/10/2015 06/19/2015  Post procedure Call Back phone  # [phone]  Permission to leave phone message Yes Yes     Patient questions:  Do you have a fever, pain , or abdominal swelling? No. Pain Score  0 *  Have you tolerated food without any problems? Yes.    Have you been able to return to your normal activities? Yes.    Do you have any questions about your discharge instructions: Diet   No. Medications  No. Follow up visit  No.  Do you have questions or concerns about your Care? No.  Actions: * If pain score is 4 or above: No action needed, pain <4.  Patient stating she passed about 3 cups of blood  via ostomy.(dark red in color) This started at 0100 today. Patient is without symptoms of hypovolemia. Patient stating she feels normal. Husband called Dr Ernestina Penna office this am and left a message with answering service regarding the passage of blood. Patient en route to see Dr Clovis Riley, surgical oncologist at [facility]. Patient stating this was a previously scheduled appointment.

## 2015-07-11 NOTE — Telephone Encounter (Signed)
I spoke to her and husband  Hgb 8.1 today and bleeding stopped.  Here is part of note w/ Dr. Clovis Riley  CT imaging today 1. Suspicion for worsiening peritoneal metastatic disease  2. Several additional hypodense tiny lesions in the liver are stable. 3. Moderate right hydronephrosis and hydroureter with periureteral stranding, despite the presence of the double-J ureteral stent. However, there is no asymmetry in enhancement or excretion of the kidneys. Other findings include gastroesophageal reflux; a small type 1 hiatal hernia; atherosclerosis.  ASSESSMENT AND PLAN: Allison Mitchell is a 54 y.o. 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 We discussed that with her disease in the liver her benefit from HIPEC could be decreased. She is still undergoing chemotherapy and now has a right pleural effusion, new from the imaging in May, 2016.  If she has pleural disease, cytoreductive surgery and HIPEC will not be an option. I suspect she has progressive disease in the abdomen and chest. If there is a benign reason for the pleural effusion we could consider it. She also has ongoing GI bleeding with some clot in the stoma bag today.  I have suggested tapping the pleural effusion and sending fluid for cytology, and follow up with her gastroenterologist with a long (mother/daughter scope) or capsule endoscopy as a next diagnostic maneuver as she had an unremarkable EGD last week. She has had lower endoscopy last week, which as unremarkable.   Electronically signed by: Algis Downs, MD 07/11/2015 10:51 AM   Capsule endoscopy possible but risk of obstruction - and could need surgery If not wanting surgery or not a candidate would not do one   Having a push enteroscopy could make sense and provide some info perhaps.  I am fearful she has diffuse sm bowel involvement based upon the CT report and limited endoscopic therapeutic options if at all but still  could be worth trying to find a lesion and stopping bleeding/  She has appt Dr. Burr Medico tomorrow and wants to discuss w/ her.

## 2015-07-12 ENCOUNTER — Encounter: Payer: Self-pay | Admitting: Hematology

## 2015-07-12 ENCOUNTER — Telehealth: Payer: Self-pay | Admitting: Hematology

## 2015-07-12 ENCOUNTER — Ambulatory Visit: Payer: BLUE CROSS/BLUE SHIELD

## 2015-07-12 ENCOUNTER — Other Ambulatory Visit (HOSPITAL_BASED_OUTPATIENT_CLINIC_OR_DEPARTMENT_OTHER): Payer: BLUE CROSS/BLUE SHIELD

## 2015-07-12 ENCOUNTER — Ambulatory Visit (HOSPITAL_COMMUNITY)
Admission: RE | Admit: 2015-07-12 | Discharge: 2015-07-12 | Disposition: A | Discharge status: Home / Self Care | Payer: BLUE CROSS/BLUE SHIELD | Source: Ambulatory Visit | Attending: Hematology | Admitting: Hematology

## 2015-07-12 ENCOUNTER — Other Ambulatory Visit: Payer: Self-pay | Admitting: *Deleted

## 2015-07-12 ENCOUNTER — Ambulatory Visit (HOSPITAL_BASED_OUTPATIENT_CLINIC_OR_DEPARTMENT_OTHER): Payer: BLUE CROSS/BLUE SHIELD | Admitting: Hematology

## 2015-07-12 ENCOUNTER — Telehealth: Payer: Self-pay | Admitting: Internal Medicine

## 2015-07-12 ENCOUNTER — Other Ambulatory Visit: Payer: Self-pay

## 2015-07-12 VITALS — BP 117/51 | HR 77 | Temp 98.7°F | Resp 18

## 2015-07-12 VITALS — BP 132/74 | HR 83 | Temp 98.7°F | Resp 18 | Ht 68.0 in | Wt 205.1 lb

## 2015-07-12 DIAGNOSIS — J90 Pleural effusion, not elsewhere classified: Secondary | ICD-10-CM

## 2015-07-12 DIAGNOSIS — C182 Malignant neoplasm of ascending colon: Secondary | ICD-10-CM

## 2015-07-12 DIAGNOSIS — Z95828 Presence of other vascular implants and grafts: Secondary | ICD-10-CM

## 2015-07-12 DIAGNOSIS — C19 Malignant neoplasm of rectosigmoid junction: Secondary | ICD-10-CM

## 2015-07-12 DIAGNOSIS — K2210 Ulcer of esophagus without bleeding: Secondary | ICD-10-CM

## 2015-07-12 DIAGNOSIS — D509 Iron deficiency anemia, unspecified: Secondary | ICD-10-CM

## 2015-07-12 DIAGNOSIS — C187 Malignant neoplasm of sigmoid colon: Secondary | ICD-10-CM

## 2015-07-12 DIAGNOSIS — T451X5A Adverse effect of antineoplastic and immunosuppressive drugs, initial encounter: Secondary | ICD-10-CM

## 2015-07-12 DIAGNOSIS — D62 Acute posthemorrhagic anemia: Secondary | ICD-10-CM

## 2015-07-12 DIAGNOSIS — C180 Malignant neoplasm of cecum: Secondary | ICD-10-CM

## 2015-07-12 DIAGNOSIS — D649 Anemia, unspecified: Secondary | ICD-10-CM

## 2015-07-12 DIAGNOSIS — D6481 Anemia due to antineoplastic chemotherapy: Secondary | ICD-10-CM

## 2015-07-12 DIAGNOSIS — R5383 Other fatigue: Secondary | ICD-10-CM

## 2015-07-12 DIAGNOSIS — K922 Gastrointestinal hemorrhage, unspecified: Secondary | ICD-10-CM

## 2015-07-12 DIAGNOSIS — C787 Secondary malignant neoplasm of liver and intrahepatic bile duct: Secondary | ICD-10-CM

## 2015-07-12 DIAGNOSIS — G620 Drug-induced polyneuropathy: Secondary | ICD-10-CM

## 2015-07-12 DIAGNOSIS — I2699 Other pulmonary embolism without acute cor pulmonale: Secondary | ICD-10-CM | POA: Diagnosis not present

## 2015-07-12 DIAGNOSIS — C189 Malignant neoplasm of colon, unspecified: Secondary | ICD-10-CM

## 2015-07-12 LAB — PROTIME-INR
INR: 1 — ABNORMAL LOW (ref 2.00–3.50)
PROTIME: 12 s (ref 10.6–13.4)

## 2015-07-12 LAB — COMPREHENSIVE METABOLIC PANEL (CC13)
ALBUMIN: 2.7 g/dL — AB (ref 3.5–5.0)
ALK PHOS: 156 U/L — AB (ref 40–150)
ALT: 11 U/L (ref 0–55)
AST: 19 U/L (ref 5–34)
Anion Gap: 9 mEq/L (ref 3–11)
BILIRUBIN TOTAL: 0.6 mg/dL (ref 0.20–1.20)
BUN: 8.5 mg/dL (ref 7.0–26.0)
CALCIUM: 8.2 mg/dL — AB (ref 8.4–10.4)
CO2: 24 mEq/L (ref 22–29)
CREATININE: 0.8 mg/dL (ref 0.6–1.1)
Chloride: 108 mEq/L (ref 98–109)
EGFR: 86 mL/min/{1.73_m2} — ABNORMAL LOW (ref >90)
Glucose: 106 mg/dl (ref 70–140)
POTASSIUM: 3 meq/L — AB (ref 3.5–5.1)
Sodium: 141 mEq/L (ref 136–145)
Total Protein: 5 g/dL — ABNORMAL LOW (ref 6.4–8.3)

## 2015-07-12 LAB — CBC WITH DIFFERENTIAL/PLATELET
BASO%: 0.3 % (ref 0.0–2.0)
BASOS ABS: 0 10*3/uL (ref 0.0–0.1)
EOS%: 1.2 % (ref 0.0–7.0)
Eosinophils Absolute: 0.1 10*3/uL (ref 0.0–0.5)
HEMATOCRIT: 22.3 % — AB (ref 34.8–46.6)
HEMOGLOBIN: 7 g/dL — AB (ref 11.6–15.9)
LYMPH#: 0.4 10*3/uL — AB (ref 0.9–3.3)
LYMPH%: 8.4 % — ABNORMAL LOW (ref 14.0–49.7)
MCH: 27.3 pg (ref 25.1–34.0)
MCHC: 31.5 g/dL (ref 31.5–36.0)
MCV: 86.7 fL (ref 79.5–101.0)
MONO#: 0.4 10*3/uL (ref 0.1–0.9)
MONO%: 9.8 % (ref 0.0–14.0)
NEUT#: 3.4 10*3/uL (ref 1.5–6.5)
NEUT%: 80.3 % — ABNORMAL HIGH (ref 38.4–76.8)
Platelets: 69 10*3/uL — ABNORMAL LOW (ref 145–400)
RBC: 2.57 10*6/uL — ABNORMAL LOW (ref 3.70–5.45)
RDW: 21 % — AB (ref 11.2–14.5)
WBC: 4.3 10*3/uL (ref 3.9–10.3)

## 2015-07-12 LAB — PREPARE RBC (CROSSMATCH)

## 2015-07-12 LAB — HOLD TUBE, BLOOD BANK

## 2015-07-12 MED ORDER — SODIUM CHLORIDE 0.9 % IJ SOLN
10.0000 mL | INTRAMUSCULAR | Status: DC | PRN
  Administered 2015-07-12: 10 mL via INTRAVENOUS
  Filled 2015-07-12: qty 10

## 2015-07-12 MED ORDER — HEPARIN SOD (PORK) LOCK FLUSH 100 UNIT/ML IV SOLN
500.0000 [IU] | Freq: Every day | INTRAVENOUS | Status: DC | PRN
  Filled 2015-07-12: qty 5

## 2015-07-12 MED ORDER — SODIUM CHLORIDE 0.9 % IJ SOLN: 10.0000 mL | INTRAMUSCULAR | Status: DC | PRN

## 2015-07-12 MED ORDER — ACETAMINOPHEN 325 MG PO TABS
650.0000 mg | ORAL_TABLET | Freq: Once | ORAL | Status: AC
  Administered 2015-07-12: 650 mg via ORAL
  Filled 2015-07-12: qty 2

## 2015-07-12 MED ORDER — SODIUM CHLORIDE 0.9 % IV SOLN: 250.0000 mL | Freq: Once | INTRAVENOUS | Status: DC

## 2015-07-12 MED ORDER — DIPHENHYDRAMINE HCL 25 MG PO CAPS
25.0000 mg | ORAL_CAPSULE | Freq: Once | ORAL | Status: AC
  Administered 2015-07-12: 25 mg via ORAL
  Filled 2015-07-12: qty 1

## 2015-07-12 MED ORDER — SODIUM CHLORIDE 0.9 % IV SOLN: Freq: Once | INTRAVENOUS | Status: DC

## 2015-07-12 NOTE — Patient Instructions (Signed)

## 2015-07-12 NOTE — Procedures (Signed)
[facility]  Procedure Note  Allison Mitchell HKU:[id number] DOB: [birth year] DOA: 07/12/2015   Dr. Burr Medico  Associated Diagnosis: c19, D62  Procedure Note: port accessed, 2 units of PRBC's infused per order, port de-accessed and flushed per protocol   Condition During Procedure: patient stable, denies any discomforts   Condition at Discharge: patient stable, husband at bedside.   Roberto Scales, RN  [facility]

## 2015-07-12 NOTE — Telephone Encounter (Signed)
Left message for patient to call back  

## 2015-07-12 NOTE — Telephone Encounter (Signed)
Tried to call stayed on hold,,,,

## 2015-07-12 NOTE — Telephone Encounter (Signed)
[phone] 

## 2015-07-12 NOTE — Telephone Encounter (Signed)
Patient needs to be informed and instructed + orders entered for a small bowel enteroscopy with MAC at [facility] 9/12 0730 I called they have the spot (none available at [facility] long)  She arrives 0630   She is at cancer center getting a transfusion today She is not yet aware of this appointment but expects notification   DX   GI bleed Melena/blood in stool

## 2015-07-12 NOTE — Progress Notes (Signed)
[place]  [phone] [phone]  Clinic Follow Up Note   Patient Care Team: [name], MD as PCP - General (Family Medicine) Alexis Frock, MD as Consulting Physician (Urology) Sheilah Mins, MD as Referring Physician (Surgical Oncology) Gatha Mayer, MD as Consulting Physician (Gastroenterology)  CHIEF COMPLAINTS:  Follow-up colon cancer  Oncology History   Next week.Colorectal cancer, stage III   Staging form: Neuroendocrine Tumor - Colon/Rectum, AJCC 7th Edition     Clinical: Stage Unknown (T4, NX, M0) - Unsigned     Pathologic: No stage assigned - Unsigned       Colorectal cancer, stage III   10/04/2014 Tumor Marker CEA 1.0 .  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 mpT4aN2aM0,stage IIIC.    10/10/2014 Surgery partial right hemicolectomy with removal of terminal ileum, sigmoid colectomy, right ureter lysis, (+) residual surgical margin    11/23/2014 - 01/06/2015 Chemotherapy FOLFOX every 2 weeks for 4 cycles    01/23/2015 - 03/02/2015 Radiation Therapy adjuvant radiation   03/27/2015 Progression CT chest, abdomen and pelvis showed new capsular nodules along the posterior margin of hepatic segment 6, measuring 2.1X1.1cm, and then you omental nodule, suspicious for new metastasis.   03/29/2015 -  Chemotherapy Restarted FOLFOX every 2 weeks   06/19/2015 Procedure EGD on 8/16 and colonoscopy 9/6 for GI bleeding, no significant findings except 2 small linear erosion     07/11/2015 Progression CT scan showed new right pleural effusion, and slightly bigger liver lesions (53m at seg 7 nd 2.4cm right liver capsule lesion.    07/13/2015 Procedure right thoracentesis    OTHER ISSUES: 1. Pulmonary embolism in segment omental branches of the right lung, diagnosed on  11/28/2014 2. Right ureter stent placed on 10/20/2014  CURRENT THERAPY: FOLFOX every 2 weeks, started on 11/23/2014, restarted on 03/29/2015 after radiation   HISTORY OF PRESENTING ILLNESS:  [name].o. female is here for follow-up after her hospital discharge. She was recently diagnosed with 3 synchronous to colon cancer, status post surgical resection. I initially saw her when she was diagnosed in the hospital.  She presented with some GI symptoms since last October 2014 at which time she was diagnosed with ischemic colitis. Due to guaiac-positive stools, patient had undergone an outpatient colonoscopy on 09/26/2014, which showed inflamed and ulcerated mucosa in the terminal ileum, and a 1.5 cm mass in the ascending colon and additional 1.5 cm mass in the mid sigmoid colon. All biopsy from the above 3 sites came back positive for invasive moderate differentiated adenocarcinoma, and the sigmoid mass containing squamous cell differentiation.   She was hospitalized for worsening of her symptoms on 10/03/2014.  A CT of the abdomen and pelvis with contrast on 10/02/2014 was remarkable for soft tissue/bowel wall thickening of the right lower pelvis center at the cecum-terminal ileum involving a portion of the adjacent sigmoid colon. This causes small bowel obstruction and obstructed the distal right ureter causing moderate to severe right hydroureteronephrosis. [name]. CT of the chest with contrast on 10/03/2014 negative for metastatic disease. MRI of the liver without and without contrast on 10/03/2014 is remarkable for small hepatic cysts, but no findings suspicious for hepatic metastatic disease. Right-sided hydroureteronephrosis was once again seen, with early small  bowel obstruction bowel gas pattern.  She underwent right hemicolectomy with primary anastomosis with sigmoid colectomy with end  colostomy on Friday, Dec 5, after cystoscopy is performed on 12/4 to rule out GU invasion and a ureteral stent placement.  She has family history of colon cancer in her grandmother and polyps in her sister. Denies risk factors for HIV or hepatitis. Denies Diabetes. No Tobacco. No ETOH. Denies prior radiation. SHe did consume significant amount of red meat until recently. We are asked to see the patient in consultation, with recommendations on the oncology standpoint  She is recovering slowly after the surgery. She had the surgical suture opened about 3 weeks ago and was treated for antibiotics , and now much improved. She has no significant pain, but mild abdominal discomfort, no nausea, her stool is getting thicker, she clean her colostomy bag daily. She was seen by Dr. Hulen Skains this morning. She noticed some cloudy urine in the past few days, with mild burning sensation, no fever or back pain. Her appetite is getting better, eats well.  She lost about 25 lbs. No fever or chills. No other new complains.   INTERIM HISTORY: Allison Mitchell returns for follow-up. She had GI bleeding with dark red watery stool output and blood clots yesterday morning. She called [place] and Dr. Carlean Purl, but wanted to keep her appointment with Dr. Clovis Riley at [facility] and did go. She underwent CT scan there, which showed new right pleural effusion. GI bleeding stopped around noon yesterday, and I scheduled her for blood transfusion today. She has stable mild to moderate fatigue, stable mild dyspnea on exertion, no significant pain, or other new symptoms.   MEDICAL HISTORY:  Past Medical History  Diagnosis Date  . GERD (gastroesophageal reflux disease)   . Depression   . Anxiety   . 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 Peripheral neuropathy due to chemotherapy   . Hydronephrosis, right     malignant--  s/p reimiplant right ureter  . History of pulmonary embolus (PE)     11-28-2014--  due to chemotherapy on  coumadin  . Anticoagulated on Coumadin   . Colorectal cancer, stage III oncologist--  dr Stefani Dama    Dx  10-10-2014---Stage IIIC, mpT4aN2aM0, Multifocal synchronous, Moderate differentiated Invasive, Mets to nodes (5 out of 17)/  s/p  Resection tumor cecum, terminal ileum, sigmoid/  currently chemoradiation therapy  . Dysuria   . S/P radiation therapy 03/02/15    completed pelvis/abd/50.4Gy  . Allergy     SEASONAL  . Blood transfusion without reported diagnosis     SURGICAL HISTORY: Past Surgical History  Procedure Laterality Date  . Cystoscopy with retrograde pyelogram, ureteroscopy and stent placement Right 10/05/2014    Procedure: [place], URETEROSCOPY  AND STENT PLACEMENT;  Surgeon: Alexis Frock, MD;  Location: [facility];  Service: Urology;  Laterality: Right;  . Laparotomy N/A 10/10/2014    Procedure: EXPLORATORY LAPAROTOMY;  Surgeon: Doreen Salvage, MD;  Location: [place];  Service: General;  Laterality: N/A;  . Partial colectomy  10/10/2014    Procedure: PARTIAL SIGMOID COLECTOMY;  Surgeon: Doreen Salvage, MD;  Location: [place];  Service: General;;  . Colostomy  10/10/2014    Procedure: COLOSTOMY;  Surgeon: Doreen Salvage, MD;  Location: [place];  Service: General;;  . Colostomy revision  10/10/2014    Procedure: PARTIAL RIGHT COLECTOMY;  Surgeon: Doreen Salvage, MD;  Location: [place];  Service: General;;  . Ureteral reimplantion Right 10/10/2014  Procedure: OPEN RIGHT URETERAL LYSIS ;  Surgeon: Alexis Frock, MD;  Location: [place];  Service: Urology;  Laterality: Right;  . Portacath placement N/A 10/17/2014    Procedure: INSERTION PORT-A-CATH LEFT SUBCLAVIAN AND MIDLINE INCISION STAPLE REMOVAL ;  Surgeon: Coralie Keens, MD;  Location: [place];  Service: General;  Laterality: N/A;  . Cystoscopy w/ retrogrades Right 01/10/2015    Procedure: CYSTOSCOPY WITH RETROGRADE PYELOGRAM;  Surgeon: Alexis Frock, MD;  Location: [facility];  Service: Urology;  Laterality: Right;   . Cystoscopy w/ ureteral stent placement Right 01/10/2015    Procedure: CYSTOSCOPY WITH STENT REPLACEMENT;  Surgeon: Alexis Frock, MD;  Location: [facility];  Service: Urology;  Laterality: Right;  . Colonoscopy      SOCIAL HISTORY: Social History   Social History  . Marital Status: Married    Spouse Name: N/A  . Number of Children: N/A  . Years of Education: N/A   Occupational History  . Not on file.   Social History Main Topics  . Smoking status: Never Smoker   . Smokeless tobacco: Never Used  . Alcohol Use: No  . Drug Use: No  . Sexual Activity: No   Other Topics Concern  . 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 Cancer Mother     skin cancer   . Diabetes Father   . CAD Father     Onset in his 63s, has 7 stents  . Heart disease Father   . Cancer Maternal Grandmother 46    colon cancer   . Breast cancer Maternal Grandmother   . Colon cancer Maternal Grandmother   . Cancer Paternal Grandmother 28    breast cancer   . Breast cancer Paternal Grandmother     ALLERGIES:  is allergic to amoxicillin.  MEDICATIONS:  Current Outpatient Prescriptions  Medication Sig Dispense Refill  . citalopram (CELEXA) 20 MG tablet Take 20-40 mg by mouth every morning.   1  . lidocaine-prilocaine (EMLA) cream Apply 1 application topically as needed. 30 g 0  . magnesium hydroxide (MILK OF MAGNESIA) 400 MG/5ML suspension Take 5 mLs by mouth daily as needed for mild constipation.    . ondansetron (ZOFRAN) 8 MG tablet Take 1 tablet (8 mg total) by mouth every 8 (eight) hours as needed for nausea or vomiting. 20 tablet 3  . oxyCODONE (OXY IR/ROXICODONE) 5 MG immediate release tablet Take 1 tablet (5 mg total) by mouth every 6 (six) hours as needed for severe pain. 30 tablet 0  . pantoprazole  (PROTONIX) 40 MG tablet Take 1 tablet (40 mg total) by mouth 2 (two) times daily. 60 tablet 1  . potassium chloride SA (K-DUR,KLOR-CON) 20 MEQ tablet Take 1 tablet (20 mEq total) by mouth daily. Take as directed. 30 tablet 1  . PREVIDENT 5000 BOOSTER PLUS 1.1 % PSTE 1 each by Other route daily.   5  . zolpidem (AMBIEN) 5 MG tablet Take 1 tablet (5 mg total) by mouth at bedtime as needed for sleep. 30 tablet 0   No current facility-administered medications for this visit.   Facility-Administered Medications Ordered in Other Visits  Medication Dose Route Frequency Provider Last Rate Last Dose  . sodium chloride 0.9 % injection 10 mL  10 mL Intracatheter PRN  Truitt Merle, MD        REVIEW OF SYSTEMS:   Constitutional: Denies fevers, chills or abnormal night sweats Eyes: Denies blurriness of vision, double vision or watery eyes Ears, nose, mouth, throat, and face: Denies mucositis or sore throat Respiratory: Denies cough, dyspnea or wheezes Cardiovascular: Denies palpitation, chest discomfort or lower extremity swelling Gastrointestinal:  Denies nausea, heartburn or change in bowel habits Skin: Denies abnormal skin rashes Lymphatics: Denies new lymphadenopathy or easy bruising Neurological:Denies numbness, tingling or new weaknesses Behavioral/Psych: Mood is stable, no new changes  All other systems were reviewed with the patient and are negative.  PHYSICAL EXAMINATION: ECOG PERFORMANCE STATUS: 1 - Symptomatic but completely ambulatory  Filed Vitals:   07/12/15 1054  BP: 132/74  Pulse: 83  Temp: 98.7 F (37.1 C)  Resp: 18   Filed Weights   07/12/15 1054  Weight: 205 lb 1.6 oz (93.033 kg)    GENERAL:alert, no distress and comfortable SKIN: skin color, texture, turgor are normal, no rashes or significant lesions EYES: normal, conjunctiva are pink and non-injected, sclera clear OROPHARYNX:no exudate, no erythema and lips, buccal mucosa, and tongue normal  NECK: supple, thyroid  normal size, non-tender, without nodularity LYMPH:  no palpable lymphadenopathy in the cervical, axillary or inguinal LUNGS: clear to auscultation and percussion with normal breathing effort HEART: regular rate & rhythm and no murmurs and no lower extremity edema ABDOMEN:abdomen soft, non-tender and normal bowel sounds. Positive for colostomy bag on the left, with dark brown liquid stool. Surgical wound upper part is well-healed. Musculoskeletal:no cyanosis of digits and no clubbing  PSYCH: alert & oriented x 3 with fluent speech NEURO: no focal motor/sensory deficits  LABORATORY DATA:  CBC Latest Ref Rng 07/06/2015 06/28/2015 06/22/2015  WBC 3.9 - 10.3 10e3/uL 18.2(H) 3.7(L) 4.5  Hemoglobin 11.6 - 15.9 g/dL 6.8(LL) 8.0(L) 9.3(L)  Hematocrit 34.8 - 46.6 % 22.0(L) 24.8(L) 29.3(L)  Platelets 145 - 400 10e3/uL 96(L) 112(L) 123(L)    CMP Latest Ref Rng 07/06/2015 06/28/2015 06/22/2015  Glucose 70 - 140 mg/dl 125 129 144(H)  BUN 7.0 - 26.0 mg/dL 10.6 10.2 7.3  Creatinine 0.6 - 1.1 mg/dL 1.0 0.8 0.8  Sodium 136 - 145 mEq/L 143 144 143  Potassium 3.5 - 5.1 mEq/L 3.1(L) 3.3(L) 3.5  Chloride 96 - 112 mmol/L - - -  CO2 22 - 29 mEq/L _0 Calcium 8.4 - 10.4 mg/dL 8.6 8.5 8.6  Total Protein 6.4 - 8.3 g/dL 5.6(L) 5.6(L) 5.7(L)  Total Bilirubin 0.20 - 1.20 mg/dL 0.49 0.59 0.60  Alkaline Phos 40 - 150 U/L 234(H) 183(H) 196(H)  AST 5 - 34 U/L 25 23 35(H)  ALT 0 - 55 U/L _1 PATHOLOGY REPORTS:  Surgical path 10/10/2014 1. Colon, segmental resection for tumor, Cecum, terminal ileum, sigmoid colon - MULTIFOCAL INVASIVE ADENOCARCINOMA, MODERATELY DIFFERENTIATED, THE LARGEST FOCUS SPANS 4.0 CM. - ADENOCARCINOMA INVOLVES SEROSA. - LYMPHOVASCULAR INVASION IS IDENTIFIED. - METASTATIC CARCINOMA IN 5 OF 17 LYMPH NODES (5/17). - THE PROXIMAL AND DISTAL SURGICAL RESECTION MARGINS ARE NEGATIVE FOR ADENOCARCINOMA. - SEE ONCOLOGY TABLE BELOW. 2. Soft tissue, biopsy, Right periureteral tissue -  ADENOCARCINOMA. 3. Soft tissue, biopsy, Right periureteral tissue - INFLAMED AND DEGENERATING FIBROADIPOSE TISSUE. - THERE IS NO EVIDENCE OF MALIGNANCY. Microscopic Comment 1. COLON AND RECTUM (INCLUDING TRANS-ANAL RESECTION): Specimen: Distal ileum, cecum, proximal ascending colon, and adherent sigmoid colon. Procedure: Resection. Tumor site: Multiple foci, including ileocecal valve, ascending colon and sigmoid. Specimen integrity: Disrupted. Macroscopic intactness of  mesorectum: N/A Macroscopic tumor perforation: Present. Invasive tumor: Maximum size: 4.0 cm. Histologic type(s): Adenocarcinoma. Histologic grade and differentiation: G2: moderately differentiated Type of polyp in which invasive carcinoma arose: Tubulovillous adenomas. Microscopic extension of invasive tumor: Focally involves the serosa. Lymph-Vascular invasion: Present. Peri-neural invasion: Not identified. Tumor deposit(s) (discontinuous extramural extension): Not identified. Resection margins: Proximal margin: 4.0 cm Distal margin: 3.5 cm Circumferential (radial) (posterior ascending, posterior descending; lateral and posterior mid-rectum; and entire lower 1/3 rectum): Cannot be assessed. Mesenteric margin (sigmoid and transverse): Cannot be assessed. Treatment effect (neo-adjuvant therapy): N/A Additional polyp(s): Not identified. Non-neoplastic findings: No significant findings. Lymph nodes: number examined 17; number positive: 5 Pathologic Staging: mpT4a, pN2a, pMX Ancillary studies: A block will be sent for MSI testing by [facility] and MSI testing by PCR and the results reported separately. Comment: There appear to be three synchronous primary tumors in the specimen, one at the ileocecal valve, one in the ascending colon, and one at the sigmoid colon. Each of these foci contain an associated tubulovillous adenoma, suggesting three separate primary sites. The tumor in the sigmoid colon appears to involve the  serosa as well. The other two tumors show extension into at least the pericolonic soft tissue. Dr Mali Rund has reviewed selected slides and concurs that there are likely three synchronous tumors here. The case was discussed with Dr Hulen Skains. Per Dr Hulen Skains, the specimen in #2 is a contiguous piece of tissue 2 of 4 Amended copy Amended FINAL for FAJR, FIFE [phone]) Microscopic Comment(continued) with the main tumor, and therefore, not considered a distant metastatic focus. Therefore, the tumor is staged as an MX. (JBK:ecj 10/16/2014) JOSHUA  Mismatch Repair (MMR) Protein Immunohistochemistry (IHC) IHC Expression Result: MLH1: Preserved nuclear expression (greater 50% tumor expression) MSH2: Preserved nuclear expression (greater 50% tumor expression) MSH6: Preserved nuclear expression (greater 50% tumor expression) PMS2: Preserved nuclear expression (greater 50% tumor expression) * Internal control demonstrates intact nuclear expression Interpretation: NORMAL There is preserved expression  Diagnosis 04/16/2015 Liver, needle/core biopsy - METASTATIC ADENOCARCINOMA, SEE COMMENT. Microscopic Comment There are well formed malignant glands with mucin. The morphology combined with the patient's history are consistent with metastatic colorectal carcinoma. Dr. Burr Medico was notified on 04/17/2015. Per request, tissue will be sent for RAS testing.     RADIOGRAPHIC STUDIES: I have personally reviewed the radiological images as listed and agreed with the findings in the report.  CT of chest, abdomen and pelvis 03/27/2015 CT OF THE CHEST, ABDOMEN AND PELVIS WITH INTRAVENOUS CONTRAST, 07/11/2015 10:05 AM  INDICATION:  C18.9 Colon cancer metastasized to multiple sites [facility])  COMPARISON: Outside CT from 03/27/2015  TECHNIQUE:  After administration of intravenous contrast, axial images of the chest, abdomen and pelvis were obtained in the portal venous phase. Supplemental 2D reformatted images  were generated and reviewed as needed.  [facility] and its affiliates are committed to minimizing radiation dose to patients while maintaining necessary diagnostic image quality. All CT scans are therefore performed using "As Low As Reasonably Achievable (ALARA)" protocols with either manual or automated exposure controls calibrated to the age and size of each patient.  FINDINGS:  CHEST .  Chest wall/thoracic inlet: Within normal limits. .  Thyroid: Within normal limits. .  Mediastinum/hila: Within normal limits. .  Heart/vessels: Within normal limits. .  Lungs: No suspicious nodules identified.  .  Pleura: Small right pleural effusion, new in the interval.  ABDOMEN .  Liver: Stable 3-4 mm hypodense lesions at the right hepatic dome at segment  7. A lesion along the right liver capsule (series 3 image 127) measures 2.4 x 1.4 cm, previously 2.1 x 1.1 cm.  .  Gallbladder/biliary: Within normal limits. .  Spleen: Splenomegaly. .  Pancreas: Within normal limits. .  Adrenals: Within normal limits. .  Kidneys: Persistent moderate right hydronephrosis with enhancement of the urothelium. A double-J ureteral stent is present in good position. .  Peritoneum/mesenteries: Hepatic capsular implant, as above. Marland Kitchen  Extraperitoneum: Within normal limits. .  Gastrointestinal tract: Right hemicolectomy with removal of terminal ileum. Sigmoid colectomy. Colostomy. Hartmann pouch. The distal small bowel bowel is mildly dilated and fluid-filled, mainly the ileum, with bowel wall thickening observed. .  Vascular: Within normal limits.  PELVIS .  Ureters: Within normal limits. .  Bladder: Within normal limits. .  Reproductive System: Fluid attenuation structures adjacent to the uterus probably represent dilated Fallopian tubes, unchanged.  .  Vascular: Within normal limits.  MSK .  Within normal limits.  COLONOSCOPY 07/10/2015 ENDOSCOPIC IMPRESSION: 1. There was evidence of a  prior ileocolonic surgical anastomosis in the right colon and a sigmoid colostomy 2. The colonic mucosa appeared normal throughout the entire examined colon  ASSESSMENT & PLAN:  54 year old Caucasian female with minimal past medical history, who was found to have 3 multifocal colon cancer at terminal ileum/cecum, ascending colon and sigmoid colon. All of them has adenocarcinoma,  And the cecum tumor has squamous differentiation. The cecum tumor has directly invaded the soft tissue along the right ureter, which was not completely resected (1% tumor left over, per Dr. Hulen Skains). She has mpT4aN2aM0 stage IIIB, MSI/MMR normal.   1. Multifocal stage IIIB colon cancer, MMR normal, MSI stable, biopsy confirmed liver and probable peritoneal metastases, KRAS mutated  -I discussed her surgical path findings extensively with patient and her husband. Unfortunately, the tumor was not completely resected. She has very small amount of tumor left over. -Given her residual tumor after surgery, advanced stage III disease, high risk recurrence in the future,  we recommended sequential adjuvant chemotherapy and irradiation -Her restaging scan after radiation unfortunately showed  probable peritoneal metastasis in liver and omentum. Liver biopsy confirmed metastasis.  -I reviewed with the abdominal MRI findings with her. The biopsied liver metastases is stable, there is additional 5 mm lesions on MRI, no significant peritoneal metastasis on the MRI. -I also reviewed her K-ras mutation results with her, she would not benefit from EGFR targeted therapy -She was referred and seen by Dr. Johney Maine to consider surgical debulking and HIPEC, and saw Dr. Clovis Riley for follow up on 9/7. Given the new pleural effusion, we would need rule out malignant effusion  -She is scheduled for right thoracentesis by IR tomorrow. -I would likely change her chemotherapy, we discussed different options, such as single agent Irinotecan, due to the  probable disease progression and GI bleeding, which could be related to chemotherapy agent especially 5-FU.  -She is not a candidate for Avastin due to her multiple episodes of GI bleeding   2. GI bleeding -She has had recurrent GI bleeding in the past months. -HC showed 2 small esophageal erosion, colonoscopy was negative -I spoke with Dr. Jenetta Downer today, he will arrange pushing endoscopy early next week.  -Her GI bleeding is also possibly related to her community, which is stopped, and chemotherapy caused mucositis   -Continue PPI.   3. PE  -She was on Lovenox injection for a month then switched to oral anticoagulant Coumadin.  -Hold on Coumadin due to the recent GI bleeding  4. right ureter stent placement -She will follow up with her urologist Dr. Tresa Moore   - she is scheduled to have the stent taken out on September 13, I suggest her to discuss with Dr. Tresa Moore again due to the recent disease progression and GI bleeding  5. 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 Her iron study is consistent with iron deficient anemia -This is secondary to the bleeding from her colon cancer. -She received IV Feraheme 561m twice.  -Repeated iron study on 05/31/15 showed ferritin 111, serum iron level normal 49, saturation 12%.  -will monitor iron study  -She is scheduled to have 2 units of RBC today at sickle cell clinic   6.G1 peripheral neuropathy, fatigue  -Secondary to chemotherapy, improved  -Continue close monitoring   Plan, -Hold on chemotherapy today  -2 units RBC blood transfusion at sickle cell clinic today  -IR right thoracentesis tomorrow for cytology  -I'll see her back in 1 week   I spent 30 mins with patient face to face. The total time spent in the appointment was 40 minutes and more than 50% was on counseling.     FTruitt Merle MD 07/12/2015 7:36 AM

## 2015-07-12 NOTE — Telephone Encounter (Signed)
Spoke to [employer] RN  No MAC set up at [facility] 9/12  If no EBUS scheduled at [facility] as of tomorrow there will be a 9/12 MAC spot available at [place] that can be used so that is my request if possible   1) 9/12 0730 enteroscopy 2) [facility] [place]  - we have a definite meod sedation spot at [facility] and waiting to see if MAC wt [facility] opens up - they should know tomorrow

## 2015-07-12 NOTE — Telephone Encounter (Signed)
Allison Mitchell called and confirmed that procedure will be at [name].  Patient notified of the recommendations and details.  She is aware to be NPO after midnight and arrive at 6:30 at [name] admitting.

## 2015-07-13 ENCOUNTER — Ambulatory Visit (HOSPITAL_COMMUNITY)
Admission: RE | Admit: 2015-07-13 | Discharge: 2015-07-13 | Disposition: A | Discharge status: Home / Self Care | Payer: BLUE CROSS/BLUE SHIELD | Source: Ambulatory Visit | Attending: Radiology | Admitting: Radiology

## 2015-07-13 ENCOUNTER — Ambulatory Visit (HOSPITAL_COMMUNITY)
Admission: RE | Admit: 2015-07-13 | Discharge: 2015-07-13 | Disposition: A | Discharge status: Home / Self Care | Payer: BLUE CROSS/BLUE SHIELD | Source: Ambulatory Visit | Attending: Hematology | Admitting: Hematology

## 2015-07-13 ENCOUNTER — Ambulatory Visit
Admission: RE | Admit: 2015-07-13 | Discharge: 2015-07-13 | Disposition: A | Discharge status: Home / Self Care | Payer: Self-pay | Source: Ambulatory Visit | Attending: Hematology | Admitting: Hematology

## 2015-07-13 ENCOUNTER — Other Ambulatory Visit: Payer: Self-pay | Admitting: Hematology

## 2015-07-13 ENCOUNTER — Other Ambulatory Visit: Payer: Self-pay | Admitting: Internal Medicine

## 2015-07-13 DIAGNOSIS — J90 Pleural effusion, not elsewhere classified: Secondary | ICD-10-CM | POA: Insufficient documentation

## 2015-07-13 DIAGNOSIS — T451X5A Adverse effect of antineoplastic and immunosuppressive drugs, initial encounter: Secondary | ICD-10-CM

## 2015-07-13 DIAGNOSIS — D6481 Anemia due to antineoplastic chemotherapy: Secondary | ICD-10-CM

## 2015-07-13 DIAGNOSIS — C801 Malignant (primary) neoplasm, unspecified: Secondary | ICD-10-CM

## 2015-07-13 DIAGNOSIS — D62 Acute posthemorrhagic anemia: Secondary | ICD-10-CM | POA: Diagnosis not present

## 2015-07-13 DIAGNOSIS — C189 Malignant neoplasm of colon, unspecified: Secondary | ICD-10-CM | POA: Diagnosis present

## 2015-07-13 DIAGNOSIS — Z9889 Other specified postprocedural states: Secondary | ICD-10-CM

## 2015-07-13 LAB — TYPE AND SCREEN
ABO/RH(D): A POS
ANTIBODY SCREEN: NEGATIVE
UNIT DIVISION: 0
Unit division: 0

## 2015-07-13 LAB — GRAM STAIN

## 2015-07-13 LAB — CEA: CEA: 1.4 ng/mL (ref 0.0–5.0)

## 2015-07-13 NOTE — Procedures (Signed)
US guided diagnostic/therapeutic right thoracentesis performed yielding 500 cc turbid, yellow fluid. The fluid was sent to the lab for preordered studies. F/u CXR pending. No immediate complications.  

## 2015-07-16 ENCOUNTER — Encounter (HOSPITAL_COMMUNITY)
Admission: RE | Disposition: A | Discharge status: Home / Self Care | Payer: Self-pay | Source: Ambulatory Visit | Attending: Internal Medicine

## 2015-07-16 ENCOUNTER — Ambulatory Visit (HOSPITAL_BASED_OUTPATIENT_CLINIC_OR_DEPARTMENT_OTHER)
Admission: RE | Admit: 2015-07-16 | Discharge: 2015-07-16 | Disposition: A | Discharge status: Home / Self Care | Payer: BLUE CROSS/BLUE SHIELD | Source: Ambulatory Visit | Attending: Internal Medicine | Admitting: Internal Medicine

## 2015-07-16 ENCOUNTER — Encounter (HOSPITAL_COMMUNITY): Payer: Self-pay | Admitting: *Deleted

## 2015-07-16 DIAGNOSIS — Z85038 Personal history of other malignant neoplasm of large intestine: Secondary | ICD-10-CM

## 2015-07-16 DIAGNOSIS — D649 Anemia, unspecified: Secondary | ICD-10-CM

## 2015-07-16 DIAGNOSIS — Z923 Personal history of irradiation: Secondary | ICD-10-CM

## 2015-07-16 DIAGNOSIS — Z86711 Personal history of pulmonary embolism: Secondary | ICD-10-CM

## 2015-07-16 DIAGNOSIS — K921 Melena: Secondary | ICD-10-CM | POA: Insufficient documentation

## 2015-07-16 HISTORY — PX: ENTEROSCOPY: SHX5533

## 2015-07-16 SURGERY — ENTEROSCOPY
Anesthesia: Moderate Sedation

## 2015-07-16 MED ORDER — DIPHENHYDRAMINE HCL 50 MG/ML IJ SOLN
INTRAMUSCULAR | Status: AC
  Filled 2015-07-16: qty 1

## 2015-07-16 MED ORDER — MIDAZOLAM HCL 5 MG/ML IJ SOLN
INTRAMUSCULAR | Status: AC
  Filled 2015-07-16: qty 2

## 2015-07-16 MED ORDER — FENTANYL CITRATE (PF) 100 MCG/2ML IJ SOLN
INTRAMUSCULAR | Status: AC
  Filled 2015-07-16: qty 2

## 2015-07-16 MED ORDER — SODIUM CHLORIDE 0.9 % IV SOLN
INTRAVENOUS | Status: DC
  Administered 2015-07-16: 500 mL via INTRAVENOUS

## 2015-07-16 MED ORDER — DIPHENHYDRAMINE HCL 50 MG/ML IJ SOLN
INTRAMUSCULAR | Status: DC | PRN
  Administered 2015-07-16: 25 mg via INTRAVENOUS

## 2015-07-16 MED ORDER — BUTAMBEN-TETRACAINE-BENZOCAINE 2-2-14 % EX AERO
INHALATION_SPRAY | CUTANEOUS | Status: DC | PRN
  Administered 2015-07-16: 2 via TOPICAL

## 2015-07-16 MED ORDER — FENTANYL CITRATE (PF) 100 MCG/2ML IJ SOLN
INTRAMUSCULAR | Status: DC | PRN
  Administered 2015-07-16: 25 ug via INTRAVENOUS
  Administered 2015-07-16 (×2): 12.5 ug via INTRAVENOUS

## 2015-07-16 MED ORDER — MIDAZOLAM HCL 10 MG/2ML IJ SOLN
INTRAMUSCULAR | Status: DC | PRN
  Administered 2015-07-16 (×2): 2 mg via INTRAVENOUS
  Administered 2015-07-16 (×3): 1 mg via INTRAVENOUS

## 2015-07-16 NOTE — H&P (Signed)
[facility] History and Physical   Primary Care Physician:  [name], MD   Reason for Procedure:  GI bleeding  Plan:    Small bowel enteroscopy - The risks and benefits as well as alternatives of endoscopic procedure(s) have been discussed and reviewed. All questions answered. The patient agrees to proceed.      HPI: Allison Mitchell is a 54 y.o. female with St IV metastatic colon cancer - she has had recurrent blood in stool and anemia and EGD and colonoscopy have not found source. For enteroscopy today.   Past Medical History  Diagnosis Date  . GERD (gastroesophageal reflux disease)   . Depression   . Anxiety   . 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 Peripheral neuropathy due to chemotherapy   . Hydronephrosis, right     malignant--  s/p reimiplant right ureter  . History of pulmonary embolus (PE)     11-28-2014--  due to chemotherapy on coumadin  . Anticoagulated on Coumadin   . Colorectal cancer, stage III oncologist--  dr Stefani Dama    Dx  10-10-2014---Stage IIIC, [id number], Multifocal synchronous, Moderate differentiated Invasive, Mets to nodes (5 out of 17)/  s/p  Resection tumor cecum, terminal ileum, sigmoid/  currently chemoradiation therapy  . Dysuria   . S/P radiation therapy 03/02/15    completed pelvis/abd/50.4Gy  . Allergy     SEASONAL  . Blood transfusion without reported diagnosis     Past Surgical History  Procedure Laterality Date  . Cystoscopy with retrograde pyelogram, ureteroscopy and stent placement Right 10/05/2014    Procedure: [place], URETEROSCOPY  AND STENT PLACEMENT;  Surgeon: Alexis Frock, MD;  Location: [facility];  Service: Urology;  Laterality: Right;  . Laparotomy N/A 10/10/2014    Procedure: EXPLORATORY LAPAROTOMY;  Surgeon: Doreen Salvage, MD;  Location: [place];  Service: General;  Laterality: N/A;  . Partial colectomy  10/10/2014    Procedure: PARTIAL SIGMOID COLECTOMY;   Surgeon: Doreen Salvage, MD;  Location: [place];  Service: General;;  . Colostomy  10/10/2014    Procedure: COLOSTOMY;  Surgeon: Doreen Salvage, MD;  Location: [place];  Service: General;;  . Colostomy revision  10/10/2014    Procedure: PARTIAL RIGHT COLECTOMY;  Surgeon: Doreen Salvage, MD;  Location: [place];  Service: General;;  . Ureteral reimplantion Right 10/10/2014    Procedure: OPEN RIGHT URETERAL LYSIS ;  Surgeon: Alexis Frock, MD;  Location: [place];  Service: Urology;  Laterality: Right;  . Portacath placement N/A 10/17/2014    Procedure: INSERTION PORT-A-CATH LEFT SUBCLAVIAN AND MIDLINE INCISION STAPLE REMOVAL ;  Surgeon: Coralie Keens, MD;  Location: [place];  Service: General;  Laterality: N/A;  . Cystoscopy w/ retrogrades Right 01/10/2015    Procedure: CYSTOSCOPY WITH RETROGRADE PYELOGRAM;  Surgeon: Alexis Frock, MD;  Location[facility];  Service: Urology;  Laterality: Right;  . Cystoscopy w/ ureteral stent placement Right 01/10/2015    Procedure: CYSTOSCOPY WITH STENT REPLACEMENT;  Surgeon: Alexis Frock, MD;  Location: [facility];  Service: Urology;  Laterality: Right;  . Colonoscopy      Prior to Admission medications   Medication Sig Start Date End Date Taking? Authorizing Provider  citalopram (CELEXA) 20 MG tablet Take 20-40 mg by mouth every morning.  09/18/14  Yes Historical Provider, MD  ondansetron (ZOFRAN) 8 MG tablet Take 1 tablet (8 mg total) by mouth every 8 (eight) hours as needed for nausea or vomiting. 01/04/15  Yes Truitt Merle, MD  oxyCODONE (OXY IR/ROXICODONE) 5 MG immediate release tablet Take 1 tablet (5 mg total) by mouth every 6 (six) hours as needed for severe pain. 05/16/15  Yes Truitt Merle, MD  pantoprazole (PROTONIX) 40 MG tablet Take 1 tablet (40 mg total) by mouth 2 (two) times daily. 06/14/15  Yes Truitt Merle, MD  potassium chloride SA (K-DUR,KLOR-CON) 20 MEQ tablet Take 1 tablet (20 mEq total) by mouth daily. Take as directed. 06/28/15  Yes Truitt Merle, MD   lidocaine-prilocaine (EMLA) cream Apply 1 application topically as needed. 11/16/14   Truitt Merle, MD  magnesium hydroxide (MILK OF MAGNESIA) 400 MG/5ML suspension Take 5 mLs by mouth daily as needed for mild constipation.    Historical Provider, MD  PREVIDENT 5000 BOOSTER PLUS 1.1 % PSTE 1 each by Other route daily.  03/20/15   Historical Provider, MD  zolpidem (AMBIEN) 5 MG tablet Take 1 tablet (5 mg total) by mouth at bedtime as needed for sleep. 05/31/15   Truitt Merle, MD    Current Facility-Administered Medications  Medication Dose Route Frequency Provider Last Rate Last Dose  . 0.9 %  sodium chloride infusion   Intravenous Continuous Gatha Mayer, MD 20 mL/hr at 07/16/15 0708 500 mL at 07/16/15 0708   Facility-Administered Medications Ordered in Other Encounters  Medication Dose Route Frequency Provider Last Rate Last Dose  . sodium chloride 0.9 % injection 10 mL  10 mL Intracatheter PRN Truitt Merle, MD        Allergies as of 07/12/2015 - Review Complete 07/12/2015  Allergen Reaction Noted  . Amoxicillin Other (See Comments) 11/28/2014    Family History  Problem Relation Age of Onset  . Cancer Mother     skin cancer   . Diabetes Father   . CAD Father     Onset in his 81s, has 7 stents  . Heart disease Father   . Cancer Maternal Grandmother 3    colon cancer   . Breast cancer Maternal Grandmother   . Colon cancer Maternal Grandmother   . Cancer Paternal Grandmother 62    breast cancer   . Breast cancer Paternal Grandmother     Social History   Social History  . Marital Status: Married    Spouse Name: N/A  . Number of Children: N/A  . Years of Education: N/A   Occupational History  . Not on file.   Social History Main Topics  . Smoking status: Never Smoker   . Smokeless tobacco: Never Used  . Alcohol Use: No  . Drug Use: No  . Sexual Activity: No   Other Topics Concern  . 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  Physical Exam: Vital signs in last 24 hours: Temp:  [98.3 F (36.8 C)] 98.3 F (36.8 C) (09/12 0659) Pulse Rate:  [75] 75 (09/12 0720) Resp:  [10-17] 17 (09/12 0720) BP: (133-146)/(58-68) 133/58 mmHg (09/12 0720) SpO2:  [96 %-97 %] 97 % (09/12 0720)   General:   Alert,  Well-developed, well-nourished, pleasant and cooperative in NAD Lungs:  Clear throughout to auscultation.   Heart:  Regular rate and rhythm; no murmurs, clicks, rubs,  or gallops. Abdomen:  Soft, nontender and nondistended. Normal bowel sounds.  Colostomy LLQ Neuro/Psych:  Alert and cooperative. Normal mood and affect. A and O x 3   @Sakeena Teall  Simonne Maffucci, MD, [name] [phone] (pager) 07/16/2015 7:31 AM@

## 2015-07-16 NOTE — Discharge Instructions (Signed)
°  This exam was normal.  I have explained to your husband. Will get with Dr. Burr Medico and see what is next.  I appreciate the opportunity to care for you. Gatha Mayer, MD, FACG   YOU HAD AN ENDOSCOPIC PROCEDURE TODAY: Refer to the procedure report and other information in the discharge instructions given to you for any specific questions about what was found during the examination. If this information does not answer your questions, please call Dr. Celesta Aver office at [phone] to clarify.   YOU SHOULD EXPECT: Some feelings of bloating in the abdomen. Passage of more gas than usual. Walking can help get rid of the air that was put into your GI tract during the procedure and reduce the bloating. If you had a lower endoscopy (such as a colonoscopy or flexible sigmoidoscopy) you may notice spotting of blood in your stool or on the toilet paper. Some abdominal soreness may be present for a day or two, also.  DIET: Your first meal following the procedure should be a light meal and then it is ok to progress to your normal diet. A half-sandwich or bowl of soup is an example of a good first meal. Heavy or fried foods are harder to digest and may make you feel nauseous or bloated. Drink plenty of fluids but you should avoid alcoholic beverages for 24 hours.   ACTIVITY: Your care partner should take you home directly after the procedure. You should plan to take it easy, moving slowly for the rest of the day. You can resume normal activity the day after the procedure however YOU SHOULD NOT DRIVE, use power tools, machinery or perform tasks that involve climbing or major physical exertion for 24 hours (because of the sedation medicines used during the test).   SYMPTOMS TO REPORT IMMEDIATELY: A gastroenterologist can be reached at any hour. Please call [phone]  for any of the following symptoms:  Following lower endoscopy (colonoscopy, flexible sigmoidoscopy) Excessive amounts of blood in the  stool  Significant tenderness, worsening of abdominal pains  Swelling of the abdomen that is new, acute  Fever of 100 or higher  Following upper endoscopy (EGD, EUS, ERCP, esophageal dilation) Vomiting of blood or coffee ground material  New, significant abdominal pain  New, significant chest pain or pain under the shoulder blades  Painful or persistently difficult swallowing  New shortness of breath  Black, tarry-looking or red, bloody stools  FOLLOW UP:  If any biopsies were taken you will be contacted by phone or by letter within the next 1-3 weeks. Call [phone]  if you have not heard about the biopsies in 3 weeks.  Please also call with any specific questions about appointments or follow up tests.

## 2015-07-16 NOTE — Op Note (Addendum)
[facility] [place], [id number]   ENTEROSCOPY PROCEDURE REPORT     EXAM DATE: 07/16/2015  PATIENT NAME:      Allison Mitchell, Allison Mitchell           MR #:      [id number]  BIRTHDATE:       June 13, 1961      VISIT #:     [id number]  ATTENDING:     Gatha Mayer, MD, Marval Regal     STATUS:     outpatient  ASSISTANT:      Sharon Mt and Dortha Schwalbe  ASA CLASS:        Class II  INDICATIONS:  The patient is a 54 yr old female here for an enteroscopy procedure due to melenic and hematochezia bleeding.  PROCEDURE PERFORMED:     Diagnostic small bowel enteroscopy  MEDICATIONS:     Fentanyl 50 mcg IV, Benadryl 25 mg IV, and Versed 7 mg IV  CONSENT: The patient understands the risks and benefits of the procedure and understands that these risks include, but are not limited to: sedation, allergic reaction, infection, perforation and/or bleeding. Alternative means of evaluation and treatment include, among others: physical exam, x-rays, and/or surgical intervention. The patient elects to proceed with this endoscopic procedure.  DESCRIPTION OF PROCEDURE: During intra-op preparation period all mechanical & medical equipment was checked for proper function. Hand hygiene and appropriate measures for infection prevention was taken. After the risks, benefits and alternatives of the procedure were thoroughly explained, Informed consent was verified, confirmed and timeout was successfully executed by the treatment team. The    endoscope was introduced through the mouth and advanced to the proximal jejunum . The overall prep quality was excellent.. The instrument was then slowly withdrawn while examining the mucosa circumferentially. The scope was then completely withdrawn from the patient and the procedure terminated. The pulse, BP, and O2 saturation were monitored and documented by the physician and the nursing staff throughout the entire procedure.   The patient was cared for as planned according to standard protocol, then discharged to recovery in stable condition and with appropriate post procedure care. Estimated blood loss is zero unless otherwise noted in this procedure report.  1) Normal esophagus, stomach, duodenum and deep into proximal jejunum.    ADVERSE EVENTS:      There were no immediate complications.  IMPRESSIONS:     1) Normal esophagus, stomach, duodenum and deep into proximal jejunum  - cause of bleeding not found  RECOMMENDATIONS:     Will consider capsule endoscopy if she would be an operative candidate - other options would be arteriogram and possible embolization, balloon deep enteroscopy at [facility]   Gatha Mayer, MD, [facility] Regal eSigned:  Gatha Mayer, MD, [facility] 07/16/2015 8:07 AM Revised: 07/16/2015 8:07 AM  cc:  Truitt Merle, MD and The Patient

## 2015-07-18 ENCOUNTER — Encounter (HOSPITAL_COMMUNITY): Payer: Self-pay | Admitting: Internal Medicine

## 2015-07-18 LAB — CULTURE, BODY FLUID W GRAM STAIN -BOTTLE

## 2015-07-18 LAB — CULTURE, BODY FLUID-BOTTLE: CULTURE: NO GROWTH

## 2015-07-19 ENCOUNTER — Encounter (HOSPITAL_COMMUNITY): Payer: Self-pay

## 2015-07-19 ENCOUNTER — Encounter: Payer: Self-pay | Admitting: Hematology

## 2015-07-19 ENCOUNTER — Ambulatory Visit (HOSPITAL_BASED_OUTPATIENT_CLINIC_OR_DEPARTMENT_OTHER): Payer: BLUE CROSS/BLUE SHIELD | Admitting: Hematology

## 2015-07-19 ENCOUNTER — Inpatient Hospital Stay (HOSPITAL_COMMUNITY)
Admission: AD | Admit: 2015-07-19 | Discharge: 2015-08-05 | DRG: 329 | Disposition: A | Discharge status: Home Health | Payer: BLUE CROSS/BLUE SHIELD | Source: Ambulatory Visit | Attending: Internal Medicine | Admitting: Internal Medicine

## 2015-07-19 ENCOUNTER — Other Ambulatory Visit (HOSPITAL_BASED_OUTPATIENT_CLINIC_OR_DEPARTMENT_OTHER): Payer: BLUE CROSS/BLUE SHIELD

## 2015-07-19 VITALS — BP 138/74 | HR 100 | Temp 98.5°F | Resp 18 | Ht 68.0 in | Wt 207.8 lb

## 2015-07-19 DIAGNOSIS — K922 Gastrointestinal hemorrhage, unspecified: Secondary | ICD-10-CM | POA: Diagnosis not present

## 2015-07-19 DIAGNOSIS — I824Y2 Acute embolism and thrombosis of unspecified deep veins of left proximal lower extremity: Secondary | ICD-10-CM

## 2015-07-19 DIAGNOSIS — E871 Hypo-osmolality and hyponatremia: Secondary | ICD-10-CM | POA: Diagnosis present

## 2015-07-19 DIAGNOSIS — I973 Postprocedural hypertension: Secondary | ICD-10-CM | POA: Diagnosis present

## 2015-07-19 DIAGNOSIS — C787 Secondary malignant neoplasm of liver and intrahepatic bile duct: Secondary | ICD-10-CM | POA: Diagnosis present

## 2015-07-19 DIAGNOSIS — D62 Acute posthemorrhagic anemia: Secondary | ICD-10-CM | POA: Diagnosis present

## 2015-07-19 DIAGNOSIS — N1330 Unspecified hydronephrosis: Secondary | ICD-10-CM | POA: Diagnosis present

## 2015-07-19 DIAGNOSIS — K565 Intestinal adhesions [bands] with obstruction (postprocedural) (postinfection): Principal | ICD-10-CM | POA: Diagnosis present

## 2015-07-19 DIAGNOSIS — E876 Hypokalemia: Secondary | ICD-10-CM | POA: Diagnosis present

## 2015-07-19 DIAGNOSIS — Z79891 Long term (current) use of opiate analgesic: Secondary | ICD-10-CM

## 2015-07-19 DIAGNOSIS — Z8249 Family history of ischemic heart disease and other diseases of the circulatory system: Secondary | ICD-10-CM

## 2015-07-19 DIAGNOSIS — G620 Drug-induced polyneuropathy: Secondary | ICD-10-CM | POA: Diagnosis present

## 2015-07-19 DIAGNOSIS — D638 Anemia in other chronic diseases classified elsewhere: Secondary | ICD-10-CM | POA: Diagnosis present

## 2015-07-19 DIAGNOSIS — D649 Anemia, unspecified: Secondary | ICD-10-CM | POA: Diagnosis present

## 2015-07-19 DIAGNOSIS — Z880 Allergy status to penicillin: Secondary | ICD-10-CM | POA: Diagnosis not present

## 2015-07-19 DIAGNOSIS — F419 Anxiety disorder, unspecified: Secondary | ICD-10-CM | POA: Diagnosis present

## 2015-07-19 DIAGNOSIS — C189 Malignant neoplasm of colon, unspecified: Secondary | ICD-10-CM

## 2015-07-19 DIAGNOSIS — D72829 Elevated white blood cell count, unspecified: Secondary | ICD-10-CM | POA: Diagnosis present

## 2015-07-19 DIAGNOSIS — C19 Malignant neoplasm of rectosigmoid junction: Secondary | ICD-10-CM | POA: Diagnosis present

## 2015-07-19 DIAGNOSIS — K921 Melena: Secondary | ICD-10-CM | POA: Diagnosis present

## 2015-07-19 DIAGNOSIS — Z6830 Body mass index (BMI) 30.0-30.9, adult: Secondary | ICD-10-CM | POA: Diagnosis not present

## 2015-07-19 DIAGNOSIS — Z808 Family history of malignant neoplasm of other organs or systems: Secondary | ICD-10-CM

## 2015-07-19 DIAGNOSIS — D6481 Anemia due to antineoplastic chemotherapy: Secondary | ICD-10-CM | POA: Diagnosis not present

## 2015-07-19 DIAGNOSIS — N182 Chronic kidney disease, stage 2 (mild): Secondary | ICD-10-CM | POA: Diagnosis present

## 2015-07-19 DIAGNOSIS — Z7901 Long term (current) use of anticoagulants: Secondary | ICD-10-CM | POA: Diagnosis not present

## 2015-07-19 DIAGNOSIS — T189XXS Foreign body of alimentary tract, part unspecified, sequela: Secondary | ICD-10-CM | POA: Diagnosis not present

## 2015-07-19 DIAGNOSIS — Z933 Colostomy status: Secondary | ICD-10-CM | POA: Diagnosis not present

## 2015-07-19 DIAGNOSIS — F329 Major depressive disorder, single episode, unspecified: Secondary | ICD-10-CM | POA: Diagnosis present

## 2015-07-19 DIAGNOSIS — Z800 Family history of malignant neoplasm of digestive organs: Secondary | ICD-10-CM | POA: Diagnosis not present

## 2015-07-19 DIAGNOSIS — Z803 Family history of malignant neoplasm of breast: Secondary | ICD-10-CM

## 2015-07-19 DIAGNOSIS — C786 Secondary malignant neoplasm of retroperitoneum and peritoneum: Secondary | ICD-10-CM | POA: Diagnosis present

## 2015-07-19 DIAGNOSIS — I2699 Other pulmonary embolism without acute cor pulmonale: Secondary | ICD-10-CM

## 2015-07-19 DIAGNOSIS — Z833 Family history of diabetes mellitus: Secondary | ICD-10-CM | POA: Diagnosis not present

## 2015-07-19 DIAGNOSIS — D6959 Other secondary thrombocytopenia: Secondary | ICD-10-CM | POA: Diagnosis present

## 2015-07-19 DIAGNOSIS — Z9221 Personal history of antineoplastic chemotherapy: Secondary | ICD-10-CM | POA: Diagnosis not present

## 2015-07-19 DIAGNOSIS — D696 Thrombocytopenia, unspecified: Secondary | ICD-10-CM | POA: Diagnosis present

## 2015-07-19 DIAGNOSIS — E43 Unspecified severe protein-calorie malnutrition: Secondary | ICD-10-CM | POA: Diagnosis present

## 2015-07-19 DIAGNOSIS — K5669 Other intestinal obstruction: Secondary | ICD-10-CM | POA: Diagnosis not present

## 2015-07-19 DIAGNOSIS — Z86711 Personal history of pulmonary embolism: Secondary | ICD-10-CM

## 2015-07-19 DIAGNOSIS — C180 Malignant neoplasm of cecum: Secondary | ICD-10-CM | POA: Diagnosis not present

## 2015-07-19 DIAGNOSIS — K210 Gastro-esophageal reflux disease with esophagitis: Secondary | ICD-10-CM | POA: Diagnosis present

## 2015-07-19 DIAGNOSIS — C182 Malignant neoplasm of ascending colon: Secondary | ICD-10-CM | POA: Diagnosis not present

## 2015-07-19 DIAGNOSIS — T189XXA Foreign body of alimentary tract, part unspecified, initial encounter: Secondary | ICD-10-CM | POA: Diagnosis not present

## 2015-07-19 DIAGNOSIS — E860 Dehydration: Secondary | ICD-10-CM | POA: Diagnosis present

## 2015-07-19 DIAGNOSIS — K56609 Unspecified intestinal obstruction, unspecified as to partial versus complete obstruction: Secondary | ICD-10-CM | POA: Diagnosis present

## 2015-07-19 DIAGNOSIS — D6859 Other primary thrombophilia: Secondary | ICD-10-CM | POA: Diagnosis present

## 2015-07-19 DIAGNOSIS — R195 Other fecal abnormalities: Secondary | ICD-10-CM | POA: Diagnosis present

## 2015-07-19 DIAGNOSIS — Z419 Encounter for procedure for purposes other than remedying health state, unspecified: Secondary | ICD-10-CM

## 2015-07-19 DIAGNOSIS — T451X5A Adverse effect of antineoplastic and immunosuppressive drugs, initial encounter: Secondary | ICD-10-CM | POA: Diagnosis present

## 2015-07-19 DIAGNOSIS — Z79899 Other long term (current) drug therapy: Secondary | ICD-10-CM

## 2015-07-19 DIAGNOSIS — D61810 Antineoplastic chemotherapy induced pancytopenia: Secondary | ICD-10-CM | POA: Diagnosis present

## 2015-07-19 DIAGNOSIS — Z923 Personal history of irradiation: Secondary | ICD-10-CM | POA: Diagnosis not present

## 2015-07-19 DIAGNOSIS — D72819 Decreased white blood cell count, unspecified: Secondary | ICD-10-CM | POA: Diagnosis present

## 2015-07-19 LAB — MRSA PCR SCREENING: MRSA by PCR: NEGATIVE

## 2015-07-19 LAB — CBC WITH DIFFERENTIAL/PLATELET
BASO%: 0.3 % (ref 0.0–2.0)
BASOS ABS: 0 10*3/uL (ref 0.0–0.1)
EOS%: 0.6 % (ref 0.0–7.0)
Eosinophils Absolute: 0 10*3/uL (ref 0.0–0.5)
HEMATOCRIT: 23.9 % — AB (ref 34.8–46.6)
HEMOGLOBIN: 7.4 g/dL — AB (ref 11.6–15.9)
LYMPH#: 0.3 10*3/uL — AB (ref 0.9–3.3)
LYMPH%: 4.3 % — ABNORMAL LOW (ref 14.0–49.7)
MCH: 28.2 pg (ref 25.1–34.0)
MCHC: 31 g/dL — ABNORMAL LOW (ref 31.5–36.0)
MCV: 91.2 fL (ref 79.5–101.0)
MONO#: 0.7 10*3/uL (ref 0.1–0.9)
MONO%: 9.8 % (ref 0.0–14.0)
NEUT#: 5.9 10*3/uL (ref 1.5–6.5)
NEUT%: 85 % — AB (ref 38.4–76.8)
Platelets: 77 10*3/uL — ABNORMAL LOW (ref 145–400)
RBC: 2.62 10*6/uL — ABNORMAL LOW (ref 3.70–5.45)
RDW: 19.8 % — ABNORMAL HIGH (ref 11.2–14.5)
WBC: 7 10*3/uL (ref 3.9–10.3)
nRBC: 0 % (ref 0–0)

## 2015-07-19 LAB — COMPREHENSIVE METABOLIC PANEL (CC13)
ALBUMIN: 2.5 g/dL — AB (ref 3.5–5.0)
ALK PHOS: 178 U/L — AB (ref 40–150)
ALT: 10 U/L (ref 0–55)
AST: 21 U/L (ref 5–34)
Anion Gap: 9 mEq/L (ref 3–11)
BILIRUBIN TOTAL: 0.57 mg/dL (ref 0.20–1.20)
BUN: 13.2 mg/dL (ref 7.0–26.0)
CALCIUM: 8.4 mg/dL (ref 8.4–10.4)
CO2: 25 mEq/L (ref 22–29)
Chloride: 107 mEq/L (ref 98–109)
Creatinine: 1.1 mg/dL (ref 0.6–1.1)
EGFR: 59 mL/min/{1.73_m2} — AB (ref >90)
Glucose: 149 mg/dl — ABNORMAL HIGH (ref 70–140)
POTASSIUM: 3.4 meq/L — AB (ref 3.5–5.1)
Sodium: 142 mEq/L (ref 136–145)
Total Protein: 5.1 g/dL — ABNORMAL LOW (ref 6.4–8.3)

## 2015-07-19 LAB — PROTIME-INR
INR: 1 — ABNORMAL LOW (ref 2.00–3.50)
Protime: 12 Seconds (ref 10.6–13.4)

## 2015-07-19 LAB — CEA: CEA: 1.3 ng/mL (ref 0.0–5.0)

## 2015-07-19 LAB — PREPARE RBC (CROSSMATCH)

## 2015-07-19 LAB — TECHNOLOGIST REVIEW

## 2015-07-19 MED ORDER — SODIUM CHLORIDE 0.9 % IV SOLN: 250.0000 mL | INTRAVENOUS | Status: DC | PRN

## 2015-07-19 MED ORDER — OXYCODONE HCL 5 MG PO TABS
5.0000 mg | ORAL_TABLET | Freq: Four times a day (QID) | ORAL | Status: DC | PRN
  Administered 2015-07-19 – 2015-07-22 (×5): 5 mg via ORAL
  Filled 2015-07-19 (×5): qty 1

## 2015-07-19 MED ORDER — ONDANSETRON HCL 4 MG/2ML IJ SOLN
4.0000 mg | Freq: Four times a day (QID) | INTRAMUSCULAR | Status: DC | PRN
  Administered 2015-07-21 – 2015-07-23 (×2): 4 mg via INTRAVENOUS
  Filled 2015-07-19 (×2): qty 2

## 2015-07-19 MED ORDER — SODIUM CHLORIDE 0.9 % IV SOLN: Freq: Once | INTRAVENOUS | Status: DC

## 2015-07-19 MED ORDER — ONDANSETRON HCL 4 MG PO TABS: 4.0000 mg | ORAL_TABLET | Freq: Four times a day (QID) | ORAL | Status: DC | PRN

## 2015-07-19 MED ORDER — ACETAMINOPHEN 325 MG PO TABS: 650.0000 mg | ORAL_TABLET | Freq: Four times a day (QID) | ORAL | Status: DC | PRN

## 2015-07-19 MED ORDER — POTASSIUM CHLORIDE CRYS ER 20 MEQ PO TBCR
20.0000 meq | EXTENDED_RELEASE_TABLET | Freq: Every day | ORAL | Status: DC
Start: 2015-07-19 — End: 2015-07-27
  Administered 2015-07-19 – 2015-07-26 (×7): 20 meq via ORAL
  Filled 2015-07-19 (×9): qty 1

## 2015-07-19 MED ORDER — ALUM & MAG HYDROXIDE-SIMETH 200-200-20 MG/5ML PO SUSP: 30.0000 mL | Freq: Four times a day (QID) | ORAL | Status: DC | PRN

## 2015-07-19 MED ORDER — PANTOPRAZOLE SODIUM 40 MG PO TBEC
40.0000 mg | DELAYED_RELEASE_TABLET | Freq: Two times a day (BID) | ORAL | Status: DC
  Administered 2015-07-19 – 2015-07-21 (×2): 40 mg via ORAL
  Filled 2015-07-19 (×3): qty 1

## 2015-07-19 MED ORDER — ZOLPIDEM TARTRATE 5 MG PO TABS
5.0000 mg | ORAL_TABLET | Freq: Every evening | ORAL | Status: DC | PRN
  Administered 2015-07-19 – 2015-07-26 (×5): 5 mg via ORAL
  Filled 2015-07-19 (×5): qty 1

## 2015-07-19 MED ORDER — ACETAMINOPHEN 650 MG RE SUPP: 650.0000 mg | Freq: Four times a day (QID) | RECTAL | Status: DC | PRN

## 2015-07-19 MED ORDER — CITALOPRAM HYDROBROMIDE 20 MG PO TABS
20.0000 mg | ORAL_TABLET | Freq: Every morning | ORAL | Status: DC
  Administered 2015-07-20 – 2015-07-26 (×6): 20 mg via ORAL
  Filled 2015-07-19 (×8): qty 1

## 2015-07-19 MED ORDER — SODIUM CHLORIDE 0.9 % IJ SOLN
3.0000 mL | Freq: Two times a day (BID) | INTRAMUSCULAR | Status: DC
  Administered 2015-07-19 – 2015-07-25 (×8): 3 mL via INTRAVENOUS

## 2015-07-19 MED ORDER — SODIUM CHLORIDE 0.9 % IJ SOLN
3.0000 mL | Freq: Two times a day (BID) | INTRAMUSCULAR | Status: DC
Start: 2015-07-19 — End: 2015-07-27
  Administered 2015-07-20 – 2015-07-27 (×10): 3 mL via INTRAVENOUS

## 2015-07-19 MED ORDER — SODIUM CHLORIDE 0.9 % IJ SOLN
3.0000 mL | INTRAMUSCULAR | Status: DC | PRN
  Administered 2015-07-25: 3 mL via INTRAVENOUS
  Filled 2015-07-19: qty 3

## 2015-07-19 NOTE — Progress Notes (Signed)
Patient reported one episode of emesis, has now resolve. Pt also empty ostomy bag, one half full and one one fourth full, each time watery with blood.

## 2015-07-19 NOTE — Progress Notes (Signed)
[place]  Telephone:([phone]) [phone] Fax:(336) [phone]  Clinic Follow Up Note   Patient Care Team: [name], MD as PCP - General (Family Medicine) Alexis Frock, MD as Consulting Physician (Urology) Sheilah Mins, MD as Referring Physician (Surgical Oncology) Gatha Mayer, MD as Consulting Physician (Gastroenterology) Truitt Merle, MD as Consulting Physician (Oncology) Judeth Horn, MD as Consulting Physician (General Surgery)  CHIEF COMPLAINTS:  Follow-up colon cancer  Oncology History   Next week.Colorectal cancer, stage III   Staging form: Neuroendocrine Tumor - Colon/Rectum, AJCC 7th Edition     Clinical: Stage Unknown (T4, NX, M0) - Unsigned     Pathologic: No stage assigned - Unsigned       Colorectal cancer, stage III   10/04/2014 Tumor Marker CEA 1.0 .  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 mpT4aN2aM0,stage IIIC.    10/10/2014 Surgery partial right hemicolectomy with removal of terminal ileum, sigmoid colectomy, right ureter lysis, (+) residual surgical margin    11/23/2014 - 01/06/2015 Chemotherapy FOLFOX every 2 weeks for 4 cycles    01/23/2015 - 03/02/2015 Radiation Therapy adjuvant radiation   03/27/2015 Progression CT chest, abdomen and pelvis showed new capsular nodules along the posterior margin of hepatic segment 6, measuring 2.1X1.1cm, and then you omental nodule, suspicious for new metastasis.   03/29/2015 -  Chemotherapy Restarted FOLFOX every 2 weeks   06/19/2015 Procedure EGD on 8/16 and colonoscopy 9/6 for GI bleeding, no significant findings except 2 small linear erosion     07/11/2015 Progression CT scan showed new right pleural effusion, and slightly bigger liver lesions (15m at seg 7 nd 2.4cm right liver capsule lesion.    07/13/2015 Procedure right thoracentesis     OTHER ISSUES: 1. Pulmonary embolism in segment omental branches of the right lung, diagnosed on 11/28/2014 2. Right ureter stent placed on 10/20/2014  CURRENT THERAPY: FOLFOX every 2 weeks, started on 11/23/2014, restarted on 03/29/2015 after radiation   HISTORY OF PRESENTING ILLNESS:  KReegan [id number].o. female is here for follow-up after her hospital discharge. She was recently diagnosed with 3 synchronous to colon cancer, status post surgical resection. I initially saw her when she was diagnosed in the hospital.  She presented with some GI symptoms since last October 2014 at which time she was diagnosed with ischemic colitis. Due to guaiac-positive stools, patient had undergone an outpatient colonoscopy on 09/26/2014, which showed inflamed and ulcerated mucosa in the terminal ileum, and a 1.5 cm mass in the ascending colon and additional 1.5 cm mass in the mid sigmoid colon. All biopsy from the above 3 sites came back positive for invasive moderate differentiated adenocarcinoma, and the sigmoid mass containing squamous cell differentiation.   She was hospitalized for worsening of her symptoms on 10/03/2014.  A CT of the abdomen and pelvis with contrast on 10/02/2014 was remarkable for soft tissue/bowel wall thickening of the right lower pelvis center at the cecum-terminal ileum involving a portion of the adjacent sigmoid colon. This causes small bowel obstruction and obstructed the distal right ureter causing moderate to severe right hydroureteronephrosis. [facility] of the chest with contrast on 10/03/2014 negative for metastatic disease. MRI of the liver without and without contrast on 10/03/2014 is remarkable for small hepatic cysts, but no  findings suspicious for hepatic metastatic disease. Right-sided hydroureteronephrosis was once again seen, with early small bowel obstruction bowel gas  pattern.  She underwent right hemicolectomy with primary anastomosis with sigmoid colectomy with end colostomy on Friday, Dec 5, after cystoscopy is performed on 12/4 to rule out GU invasion and a ureteral stent placement.  She has family history of colon cancer in her grandmother and polyps in her sister. Denies risk factors for HIV or hepatitis. Denies Diabetes. No Tobacco. No ETOH. Denies prior radiation. SHe did consume significant amount of red meat until recently. We are asked to see the patient in consultation, with recommendations on the oncology standpoint  She is recovering slowly after the surgery. She had the surgical suture opened about 3 weeks ago and was treated for antibiotics , and now much improved. She has no significant pain, but mild abdominal discomfort, no nausea, her stool is getting thicker, she clean her colostomy bag daily. She was seen by Dr. Hulen Skains this morning. She noticed some cloudy urine in the past few days, with mild burning sensation, no fever or back pain. Her appetite is getting better, eats well.  She lost about 25 lbs. No fever or chills. No other new complains.   INTERIM HISTORY: Adhya returns for follow-up. She has had GI bleeding with dark red watery stool output in the past 3-4 days. She underwent push endoscopy 3 days ago, which was negative for source of bleeding. She also underwent right thoracentesis by IR last Friday, and cytology was negative for tumor cells. She has mild to moderate fatigue, mild dyspnea on exertion, no chest pain, mild tenderness at the right low abdomen, and her appetite has been low last week. She is eating small portion of the medial, and drinking enough. No dizziness. No fever or chills.  MEDICAL HISTORY:  Past Medical History  Diagnosis Date  . GERD (gastroesophageal reflux disease)   . Depression   . Anxiety   . 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 Peripheral neuropathy due to chemotherapy   .  Hydronephrosis, right     malignant--  s/p reimiplant right ureter  . History of pulmonary embolus (PE)     11-28-2014--  due to chemotherapy on coumadin  . Anticoagulated on Coumadin   . Colorectal cancer, stage III oncologist--  dr Stefani Dama    Dx  10-10-2014---Stage IIIC, mpT4aN2aM0, Multifocal synchronous, Moderate differentiated Invasive, Mets to nodes (5 out of 17)/  s/p  Resection tumor cecum, terminal ileum, sigmoid/  currently chemoradiation therapy  . Dysuria   . S/P radiation therapy 03/02/15    completed pelvis/abd/50.4Gy  . Allergy     SEASONAL  . Blood transfusion without reported diagnosis     SURGICAL HISTORY: Past Surgical History  Procedure Laterality Date  . Cystoscopy with retrograde pyelogram, ureteroscopy and stent placement Right 10/05/2014    Procedure: [place], URETEROSCOPY  AND STENT PLACEMENT;  Surgeon: Alexis Frock, MD;  Location: [facility];  Service: Urology;  Laterality: Right;  . Laparotomy N/A 10/10/2014    Procedure: EXPLORATORY LAPAROTOMY;  Surgeon: Doreen Salvage, MD;  Location: [place];  Service: General;  Laterality: N/A;  . Partial colectomy  10/10/2014    Procedure: PARTIAL SIGMOID COLECTOMY;  Surgeon: Doreen Salvage, MD;  Location: [place];  Service: General;;  . Colostomy  10/10/2014    Procedure: COLOSTOMY;  Surgeon: Doreen Salvage, MD;  Location: [place];  Service: General;;  . Colostomy revision  10/10/2014  Procedure: PARTIAL RIGHT COLECTOMY;  Surgeon: Doreen Salvage, MD;  Location: [place];  Service: General;;  . Ureteral reimplantion Right 10/10/2014    Procedure: OPEN RIGHT URETERAL LYSIS ;  Surgeon: Alexis Frock, MD;  Location: [place];  Service: Urology;  Laterality: Right;  . Portacath placement N/A 10/17/2014    Procedure: INSERTION PORT-A-CATH LEFT SUBCLAVIAN AND MIDLINE INCISION STAPLE REMOVAL ;  Surgeon: Coralie Keens, MD;  Location: [place];  Service: General;  Laterality: N/A;  . Cystoscopy w/ retrogrades Right 01/10/2015     Procedure: CYSTOSCOPY WITH RETROGRADE PYELOGRAM;  Surgeon: Alexis Frock, MD;  Location: [facility];  Service: Urology;  Laterality: Right;  . Cystoscopy w/ ureteral stent placement Right 01/10/2015    Procedure: CYSTOSCOPY WITH STENT REPLACEMENT;  Surgeon: Alexis Frock, MD;  Location: [facility];  Service: Urology;  Laterality: Right;  . Colonoscopy    . Enteroscopy N/A 07/16/2015    Procedure: ENTEROSCOPY;  Surgeon: Gatha Mayer, MD;  Location: [place];  Service: Endoscopy;  Laterality: N/A;    SOCIAL HISTORY: Social History   Social History  . Marital Status: Married    Spouse Name: N/A  . Number of Children: N/A  . Years of Education: N/A   Occupational History  . Not on file.   Social History Main Topics  . Smoking status: Never Smoker   . Smokeless tobacco: Never Used  . Alcohol Use: No  . Drug Use: No  . Sexual Activity: No   Other Topics Concern  . 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 Cancer Mother     skin cancer   . Diabetes Father   . CAD Father     Onset in his 93s, has 7 stents  . Heart disease Father   . Cancer Maternal Grandmother 34    colon cancer   . Breast cancer Maternal Grandmother   . Colon cancer Maternal Grandmother   . Cancer Paternal Grandmother 53    breast cancer   . Breast cancer Paternal Grandmother     ALLERGIES:  is allergic to amoxicillin.  MEDICATIONS:  Current Outpatient Prescriptions  Medication Sig Dispense Refill  . citalopram (CELEXA) 20 MG tablet Take 20-40 mg by mouth every morning.   1  . lidocaine-prilocaine (EMLA) cream Apply 1 application topically as needed. 30 g 0  . magnesium hydroxide (MILK OF MAGNESIA) 400 MG/5ML suspension Take 5 mLs by mouth daily as needed for mild constipation.    Marland Kitchen nystatin  (MYCOSTATIN) 100000 UNIT/ML suspension TAKE 5MLS BY MOUTH FOUR TIMES DAILY AS NEEDED FOR MOUTH THRUSH  0  . ondansetron (ZOFRAN) 8 MG tablet Take 1 tablet (8 mg total) by mouth every 8 (eight) hours as needed for nausea or vomiting. 20 tablet 3  . oxyCODONE (OXY IR/ROXICODONE) 5 MG immediate release tablet Take 1 tablet (5 mg total) by mouth every 6 (six) hours as needed for severe pain. 30 tablet 0  . pantoprazole (PROTONIX) 40 MG tablet Take 1 tablet (40 mg total) by mouth 2 (two) times daily. 60 tablet 1  . potassium chloride SA (K-DUR,KLOR-CON) 20 MEQ tablet Take 1 tablet (20 mEq total) by mouth daily. Take as directed. 30 tablet 1  . PREVIDENT 5000 BOOSTER PLUS 1.1 % PSTE 1 each  by Other route daily.   5  . zolpidem (AMBIEN) 5 MG tablet Take 1 tablet (5 mg total) by mouth at bedtime as needed for sleep. 30 tablet 0   No current facility-administered medications for this visit.   Facility-Administered Medications Ordered in Other Visits  Medication Dose Route Frequency Provider Last Rate Last Dose  . sodium chloride 0.9 % injection 10 mL  10 mL Intracatheter PRN Truitt Merle, MD        REVIEW OF SYSTEMS:   Constitutional: Denies fevers, chills or abnormal night sweats Eyes: Denies blurriness of vision, double vision or watery eyes Ears, nose, mouth, throat, and face: Denies mucositis or sore throat Respiratory: Denies cough, dyspnea or wheezes Cardiovascular: Denies palpitation, chest discomfort or lower extremity swelling Gastrointestinal:  Denies nausea, heartburn or change in bowel habits Skin: Denies abnormal skin rashes Lymphatics: Denies new lymphadenopathy or easy bruising Neurological:Denies numbness, tingling or new weaknesses Behavioral/Psych: Mood is stable, no new changes  All other systems were reviewed with the patient and are negative.  PHYSICAL EXAMINATION: ECOG PERFORMANCE STATUS: 1 - Symptomatic but completely ambulatory  Filed Vitals:   07/19/15 0836  BP: 138/74    Pulse: 100  Temp: 98.5 F (36.9 C)  Resp: 18   Filed Weights   07/19/15 0836  Weight: 207 lb 12.8 oz (94.257 kg)    GENERAL:alert, no distress and comfortable SKIN: skin color, texture, turgor are normal, no rashes or significant lesions EYES: normal, conjunctiva are pink and non-injected, sclera clear OROPHARYNX:no exudate, no erythema and lips, buccal mucosa, and tongue normal  NECK: supple, thyroid normal size, non-tender, without nodularity LYMPH:  no palpable lymphadenopathy in the cervical, axillary or inguinal LUNGS: clear to auscultation and percussion with normal breathing effort HEART: regular rate & rhythm and no murmurs and no lower extremity edema ABDOMEN:abdomen soft, non-tender and normal bowel sounds. Positive for colostomy bag on the left, with dark brown liquid stool. Surgical wound upper part is well-healed. Musculoskeletal:no cyanosis of digits and no clubbing  PSYCH: alert & oriented x 3 with fluent speech NEURO: no focal motor/sensory deficits  LABORATORY DATA:  CBC Latest Ref Rng 07/19/2015 07/12/2015 07/06/2015  WBC 3.9 - 10.3 10e3/uL 7.0 4.3 18.2(H)  Hemoglobin 11.6 - 15.9 g/dL 7.4(L) 7.0(L) 6.8(LL)  Hematocrit 34.8 - 46.6 % 23.9(L) 22.3(L) 22.0(L)  Platelets 145 - 400 10e3/uL 77(L) 69(L) 96(L)    CMP Latest Ref Rng 07/19/2015 07/12/2015 07/06/2015  Glucose 70 - 140 mg/dl 149(H) 106 125  BUN 7.0 - 26.0 mg/dL 13.2 8.5 10.6  Creatinine 0.6 - 1.1 mg/dL 1.1 0.8 1.0  Sodium 136 - 145 mEq/L 142 141 143  Potassium 3.5 - 5.1 mEq/L 3.4(L) 3.0(LL) 3.1(L)  Chloride 96 - 112 mmol/L - - -  CO2 22 - 29 mEq/L _0 Calcium 8.4 - 10.4 mg/dL 8.4 8.2(L) 8.6  Total Protein 6.4 - 8.3 g/dL 5.1(L) 5.0(L) 5.6(L)  Total Bilirubin 0.20 - 1.20 mg/dL 0.57 0.60 0.49  Alkaline Phos 40 - 150 U/L 178(H) 156(H) 234(H)  AST 5 - 34 U/L _1 ALT 0 - 55 U/L _2 PATHOLOGY REPORTS:  Surgical path 10/10/2014 1. Colon, segmental resection for tumor, Cecum, terminal ileum,  sigmoid colon - MULTIFOCAL INVASIVE ADENOCARCINOMA, MODERATELY DIFFERENTIATED, THE LARGEST FOCUS SPANS 4.0 CM. - ADENOCARCINOMA INVOLVES SEROSA. - LYMPHOVASCULAR INVASION IS IDENTIFIED. - METASTATIC CARCINOMA IN 5 OF 17 LYMPH NODES (5/17). - THE PROXIMAL AND DISTAL SURGICAL RESECTION MARGINS ARE NEGATIVE FOR ADENOCARCINOMA. -  SEE ONCOLOGY TABLE BELOW. 2. Soft tissue, biopsy, Right periureteral tissue - ADENOCARCINOMA. 3. Soft tissue, biopsy, Right periureteral tissue - INFLAMED AND DEGENERATING FIBROADIPOSE TISSUE. - THERE IS NO EVIDENCE OF MALIGNANCY. Microscopic Comment 1. COLON AND RECTUM (INCLUDING TRANS-ANAL RESECTION): Specimen: Distal ileum, cecum, proximal ascending colon, and adherent sigmoid colon. Procedure: Resection. Tumor site: Multiple foci, including ileocecal valve, ascending colon and sigmoid. Specimen integrity: Disrupted. Macroscopic intactness of mesorectum: N/A Macroscopic tumor perforation: Present. Invasive tumor: Maximum size: 4.0 cm. Histologic type(s): Adenocarcinoma. Histologic grade and differentiation: G2: moderately differentiated Type of polyp in which invasive carcinoma arose: Tubulovillous adenomas. Microscopic extension of invasive tumor: Focally involves the serosa. Lymph-Vascular invasion: Present. Peri-neural invasion: Not identified. Tumor deposit(s) (discontinuous extramural extension): Not identified. Resection margins: Proximal margin: 4.0 cm Distal margin: 3.5 cm Circumferential (radial) (posterior ascending, posterior descending; lateral and posterior mid-rectum; and entire lower 1/3 rectum): Cannot be assessed. Mesenteric margin (sigmoid and transverse): Cannot be assessed. Treatment effect (neo-adjuvant therapy): N/A Additional polyp(s): Not identified. Non-neoplastic findings: No significant findings. Lymph nodes: number examined 17; number positive: 5 Pathologic Staging: mpT4a, pN2a, pMX Ancillary studies: A block will be sent  for MSI testing by [facility] and MSI testing by PCR and the results reported separately. Comment: There appear to be three synchronous primary tumors in the specimen, one at the ileocecal valve, one in the ascending colon, and one at the sigmoid colon. Each of these foci contain an associated tubulovillous adenoma, suggesting three separate primary sites. The tumor in the sigmoid colon appears to involve the serosa as well. The other two tumors show extension into at least the pericolonic soft tissue. Dr Mali Rund has reviewed selected slides and concurs that there are likely three synchronous tumors here. The case was discussed with Dr Hulen Skains. Per Dr Hulen Skains, the specimen in #2 is a contiguous piece of tissue 2 of 4 Amended copy Amended FINAL for JOVANNI, ECKHART [id number]) Microscopic Comment(continued) with the main tumor, and therefore, not considered a distant metastatic focus. Therefore, the tumor is staged as an MX. (JBK:ecj 10/16/2014) JOSHUA  Mismatch Repair (MMR) Protein Immunohistochemistry (IHC) IHC Expression Result: MLH1: Preserved nuclear expression (greater 50% tumor expression) MSH2: Preserved nuclear expression (greater 50% tumor expression) MSH6: Preserved nuclear expression (greater 50% tumor expression) PMS2: Preserved nuclear expression (greater 50% tumor expression) * Internal control demonstrates intact nuclear expression Interpretation: NORMAL There is preserved expression  Diagnosis 04/16/2015 Liver, needle/core biopsy - METASTATIC ADENOCARCINOMA, SEE COMMENT. Microscopic Comment There are well formed malignant glands with mucin. The morphology combined with the patient's history are consistent with metastatic colorectal carcinoma. Dr. Burr Medico was notified on 04/17/2015. Per request, tissue will be sent for RAS testing.     RADIOGRAPHIC STUDIES: I have personally reviewed the radiological images as listed and agreed with the findings in the report.  CT of  chest, abdomen and pelvis 03/27/2015 CT OF THE CHEST, ABDOMEN AND PELVIS WITH INTRAVENOUS CONTRAST, 07/11/2015 10:05 AM  INDICATION:  C18.9 Colon cancer metastasized to multiple sites [facility])  COMPARISON: Outside CT from 03/27/2015  TECHNIQUE:  After administration of intravenous contrast, axial images of the chest, abdomen and pelvis were obtained in the portal venous phase. Supplemental 2D reformatted images were generated and reviewed as needed.  [facility] and its affiliates are committed to minimizing radiation dose to patients while maintaining necessary diagnostic image quality. All CT scans are therefore performed using "As Low As Reasonably Achievable (ALARA)" protocols with either manual or automated exposure controls calibrated to the  age and size of each patient.  FINDINGS:  CHEST .  Chest wall/thoracic inlet: Within normal limits. .  Thyroid: Within normal limits. .  Mediastinum/hila: Within normal limits. .  Heart/vessels: Within normal limits. .  Lungs: No suspicious nodules identified.  .  Pleura: Small right pleural effusion, new in the interval.  ABDOMEN .  Liver: Stable 3-4 mm hypodense lesions at the right hepatic dome at segment 7. A lesion along the right liver capsule (series 3 image 127) measures 2.4 x 1.4 cm, previously 2.1 x 1.1 cm.  .  Gallbladder/biliary: Within normal limits. .  Spleen: Splenomegaly. .  Pancreas: Within normal limits. .  Adrenals: Within normal limits. .  Kidneys: Persistent moderate right hydronephrosis with enhancement of the urothelium. A double-J ureteral stent is present in good position. .  Peritoneum/mesenteries: Hepatic capsular implant, as above. Marland Kitchen  Extraperitoneum: Within normal limits. .  Gastrointestinal tract: Right hemicolectomy with removal of terminal ileum. Sigmoid colectomy. Colostomy. Hartmann pouch. The distal small bowel bowel is mildly dilated and fluid-filled, mainly the ileum, with bowel wall  thickening observed. .  Vascular: Within normal limits.  PELVIS .  Ureters: Within normal limits. .  Bladder: Within normal limits. .  Reproductive System: Fluid attenuation structures adjacent to the uterus probably represent dilated Fallopian tubes, unchanged.  .  Vascular: Within normal limits.  MSK .  Within normal limits.  COLONOSCOPY 07/10/2015 ENDOSCOPIC IMPRESSION: 1. There was evidence of a prior ileocolonic surgical anastomosis in the right colon and a sigmoid colostomy 2. The colonic mucosa appeared normal throughout the entire examined colon  ASSESSMENT & PLAN:  54 year old Caucasian female with minimal past medical history, who was found to have 3 multifocal colon cancer at terminal ileum/cecum, ascending colon and sigmoid colon. All of them has adenocarcinoma,  And the cecum tumor has squamous differentiation. The cecum tumor has directly invaded the soft tissue along the right ureter, which was not completely resected (1% tumor left over, per Dr. Hulen Skains). She has mpT4aN2aM0 stage IIIB, MSI/MMR normal.   1. GI bleeding -She has had recurrent GI bleeding in the past one month. -She received 2 units of RBC on August 11, September 3 and September 8, and hemoglobin 7.4 today after 2 units of blood last week -[facility] showed 2 small esophageal erosion, colonoscopy was negative, push endoscopy was also negative -she follows with GI Dr. Carlean Purl   -Given her worsening GI bleeding, no clear source, I'll admit her to [facility] today. -I spoke with our ER Dr. Jarvis Newcomer today, will consult IR when she is in the hospital, and get tagged RBC nuclear study done today, if negative, consider angiogram tomorrow. -I informed Dr. Carlean Purl and her surgeon Dr. Hulen Skains about her GI bleeding and hospitalization, they probably will see her in the hospital also -She will receive blood transfusion when she is in the hospital  2. Multifocal stage IIIB colon cancer, MMR normal, MSI stable, biopsy  confirmed liver and probable peritoneal metastases, KRAS mutated  -I previously discussed her surgical path findings extensively with patient and her husband. Unfortunately, the tumor was not completely resected. She has very small amount of tumor left over. -Given her residual tumor after surgery, advanced stage III disease, high risk recurrence in the future,  we recommended sequential adjuvant chemotherapy and irradiation -Her restaging scan after radiation unfortunately showed  probable peritoneal metastasis in liver and omentum. Liver biopsy confirmed metastasis.  -I reviewed with the abdominal MRI findings with her. The biopsied liver metastases is  stable, there is additional 5 mm lesions on MRI, no significant peritoneal metastasis on the MRI. -I also reviewed her K-ras mutation results with her, she would not benefit from EGFR targeted therapy -She was referred and seen by Dr. Johney Maine to consider surgical debulking and HIPEC, and saw Dr. Clovis Riley for follow up on 9/7.  -Her right pleural effusion cytology was negative for tumor cells -I recommend to change her chemotherapy to FOLFIRI, or start with single agent Irinotecan we can can not find the source of her GI bleeding.  -She is not a candidate for Avastin due to her multiple episodes of GI bleeding  -We will start her chemotherapy when her GI bleeding resolves.   3. PE  -She was on Lovenox injection for a month then switched to oral anticoagulant Coumadin.  -Hold on Coumadin due to the recent GI bleeding   4. right ureter stent placement -This was removed yesterday  5. 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 Her iron study is consistent with iron deficient anemia -This is secondary to the bleeding from her colon cancer. -She received IV Feraheme 533m twice.  -Repeated iron study on 05/31/15 showed ferritin 111, serum iron level normal 49, saturation 12%.  -will monitor iron study   6. Thrombocytopenia, secondary  to chemotherapy -Improved, 77K today. -continue monitoring    [facility].  I spent 30 mins with patient face to face. The total time spent in the appointment was 40 minutes and more than 50% was on counseling.     FTruitt Merle MD 07/19/2015 12:43 PM

## 2015-07-19 NOTE — Progress Notes (Signed)
Patient  a direct admit from the [facility]. Alert and orientedx3. Patient has  A colostomy to Left side draining dark brown stool. Placed comfortably in bed. Will continue to monitor.

## 2015-07-20 ENCOUNTER — Inpatient Hospital Stay (HOSPITAL_COMMUNITY): Payer: BLUE CROSS/BLUE SHIELD

## 2015-07-20 DIAGNOSIS — C180 Malignant neoplasm of cecum: Secondary | ICD-10-CM

## 2015-07-20 DIAGNOSIS — Z86711 Personal history of pulmonary embolism: Secondary | ICD-10-CM

## 2015-07-20 DIAGNOSIS — C787 Secondary malignant neoplasm of liver and intrahepatic bile duct: Secondary | ICD-10-CM

## 2015-07-20 DIAGNOSIS — D6959 Other secondary thrombocytopenia: Secondary | ICD-10-CM

## 2015-07-20 DIAGNOSIS — K922 Gastrointestinal hemorrhage, unspecified: Secondary | ICD-10-CM

## 2015-07-20 DIAGNOSIS — D62 Acute posthemorrhagic anemia: Secondary | ICD-10-CM

## 2015-07-20 DIAGNOSIS — C182 Malignant neoplasm of ascending colon: Secondary | ICD-10-CM

## 2015-07-20 DIAGNOSIS — C187 Malignant neoplasm of sigmoid colon: Secondary | ICD-10-CM

## 2015-07-20 DIAGNOSIS — J90 Pleural effusion, not elsewhere classified: Secondary | ICD-10-CM

## 2015-07-20 LAB — BASIC METABOLIC PANEL
ANION GAP: 6 (ref 5–15)
BUN: 14 mg/dL (ref 6–20)
CHLORIDE: 106 mmol/L (ref 101–111)
CO2: 27 mmol/L (ref 22–32)
Calcium: 7.7 mg/dL — ABNORMAL LOW (ref 8.9–10.3)
Creatinine, Ser: 1.05 mg/dL — ABNORMAL HIGH (ref 0.44–1.00)
GFR calc Af Amer: >60 mL/min (ref >60)
GFR, EST NON AFRICAN AMERICAN: 59 mL/min — AB (ref >60)
GLUCOSE: 106 mg/dL — AB (ref 65–99)
POTASSIUM: 3.3 mmol/L — AB (ref 3.5–5.1)
Sodium: 139 mmol/L (ref 135–145)

## 2015-07-20 LAB — CBC
HEMATOCRIT: 23.3 % — AB (ref 36.0–46.0)
HEMOGLOBIN: 7.5 g/dL — AB (ref 12.0–15.0)
MCH: 28.3 pg (ref 26.0–34.0)
MCHC: 32.2 g/dL (ref 30.0–36.0)
MCV: 87.9 fL (ref 78.0–100.0)
Platelets: 73 10*3/uL — ABNORMAL LOW (ref 150–400)
RBC: 2.65 MIL/uL — ABNORMAL LOW (ref 3.87–5.11)
RDW: 18.8 % — ABNORMAL HIGH (ref 11.5–15.5)
WBC: 5.2 10*3/uL (ref 4.0–10.5)

## 2015-07-20 LAB — LACTATE DEHYDROGENASE: LDH: 179 U/L (ref 98–192)

## 2015-07-20 LAB — PREPARE RBC (CROSSMATCH)

## 2015-07-20 MED ORDER — PEG-KCL-NACL-NASULF-NA ASC-C 100 G PO SOLR
1.0000 | Freq: Once | ORAL | Status: DC
  Filled 2015-07-20: qty 1

## 2015-07-20 MED ORDER — SODIUM CHLORIDE 0.9 % IV SOLN
Freq: Once | INTRAVENOUS | Status: AC
  Administered 2015-07-20: 16:00:00 via INTRAVENOUS

## 2015-07-20 MED ORDER — TECHNETIUM TC 99M-LABELED RED BLOOD CELLS IV KIT: 20.0000 | PACK | Freq: Once | INTRAVENOUS | Status: DC | PRN

## 2015-07-20 NOTE — Progress Notes (Signed)
[name] PROGRESS NOTE  Allison Mitchell PZW:[id number] DOB: May 14, 1961 DOA: 07/19/2015 PCP: [name], MD  Assessment/Plan: 1. Acute blood loss anemia. -Patient with history of stage III colon cancer, status post right hemicolectomy, presenting with ongoing GI bleed. -Initial labs revealed hemoglobin of 7.4 for which she was typed and cross and transfuse 2 units of packed red blood cells overnight. Despite this intervention a.m. lab work showing minimal improvement of her hemoglobin at 7.5 likely consistent with ongoing bleed. -Will transfuse an additional 2 units of blood today.  -Patient otherwise hemodynamically stable. Interventional radiology consulted, tagged red blood cell scan has been ordered.  2.  GI bleed. -She has done extensive workup for GI bleed that has included upper and lower endoscopies which haven't revealed a source of bleed. -Patient requiring a total of 4 units of packed red blood cells to be transfused in the last 24 hours as she continues to have blood loss. -GI interventional radiology consulted -Bleeding scan has been ordered, will follow-up on further recommendations from interventional radiology regarding angiography.  3.  Stage III colon cancer. -Status post right hemicolectomy with colostomy placement. -Patient undergoing adjuvant chemotherapy and radiation therapy at the [facility] which is currently on hold secondary to GI bleed.  4.  Thrombocytopenia. -Likely secondary to chemotherapy, platelet count stable at 73,000.  Code Status: Full Code Family Communication: Family not present Disposition Plan: Continue supportive care, anticipate discharge home when medically stable   Consultants: GI Interventional radiology Medical oncology  Procedures:  Red blood cell scan   HPI/Subjective: Patient is a pleasant [age] female with a history of colon cancer, status post partial right hemicolectomy, has had GI bleed for the past several  weeks, characterized by maroon-colored watery stool mixed with clots in her ostomy bag, status post upper and lower endoscopy without obvious source of bleed. She was added as a direct admit from Dr. Ernestina Penna office for further workup. Initial lab work revealed a hemoglobin of 7.4 for which she was typed and cross and transfuse 2 units packed red bloods. The following day hemoglobin showed minimal improvement at 7.5 for which she was transfuse additional blood. Interventional radiology was consulted.   Objective: Filed Vitals:   07/20/15 0544  BP: 113/54  Pulse: 81  Temp: 98.6 F (37 C)  Resp: 16    Intake/Output Summary (Last 24 hours) at 07/20/15 1343 Last data filed at 07/20/15 0135  Gross per 24 hour  Intake   1107 ml  Output      1 ml  Net   1106 ml   There were no vitals filed for this visit.  Exam:   General:  Patient is in no acute distress, she is awake and alert oriented  Cardiovascular: regular rate and rhythm normal S1-S2 no murmurs rubs or gallops  Respiratory: normal respiratory effort, lungs are clear to auscultation bilaterally  Abdomen: soft nontender nondistended, colostomy bag showing maroon colored liquid with clots  Musculoskeletal: no edema  Data Reviewed: Basic Metabolic Panel:  Recent Labs Lab 07/19/15 0801 07/20/15 0520  NA 142 139  K 3.4* 3.3*  CL  --  106  CO2 25 27  GLUCOSE 149* 106*  BUN 13.2 14  CREATININE 1.1 1.05*  CALCIUM 8.4 7.7*   Liver Function Tests:  Recent Labs Lab 07/19/15 0801  AST 21  ALT 10  ALKPHOS 178*  BILITOT 0.57  PROT 5.1*  ALBUMIN 2.5*   No results for input(s): LIPASE, AMYLASE in the last 168 hours. No  results for input(s): AMMONIA in the last 168 hours. CBC:  Recent Labs Lab 07/19/15 0801 07/20/15 0520  WBC 7.0 5.2  NEUTROABS 5.9  --   HGB 7.4* 7.5*  HCT 23.9* 23.3*  MCV 91.2 87.9  PLT 77* 73*   Cardiac Enzymes: No results for input(s): CKTOTAL, CKMB, CKMBINDEX, TROPONINI in the last 168  hours. BNP (last 3 results) No results for input(s): BNP in the last 8760 hours.  ProBNP (last 3 results) No results for input(s): PROBNP in the last 8760 hours.  CBG: No results for input(s): GLUCAP in the last 168 hours.  Recent Results (from the past 240 hour(s))  Culture, body fluid-bottle     Status: None   Collection Time: 07/13/15 11:56 AM  Result Value Ref Range Status   Specimen Description FLUID PLEURAL  Final   Special Requests BAA 10CCS  Final   Culture NO GROWTH 5 DAYS  Final   Report Status 07/18/2015 FINAL  Final  Gram stain     Status: None   Collection Time: 07/13/15 11:56 AM  Result Value Ref Range Status   Specimen Description PLEURAL  Final   Special Requests NONE  Final   Gram Stain   Final    ABUNDANT WBC PRESENT,BOTH PMN AND MONONUCLEAR NO ORGANISMS SEEN    Report Status 07/13/2015 FINAL  Final  TECHNOLOGIST REVIEW     Status: None   Collection Time: 07/19/15  8:01 AM  Result Value Ref Range Status   Technologist Review rare rbc fragment present  Final  MRSA PCR Screening     Status: None   Collection Time: 07/19/15  2:22 PM  Result Value Ref Range Status   MRSA by PCR NEGATIVE NEGATIVE Final    Comment:        The GeneXpert MRSA Assay (FDA approved for NASAL specimens only), is one component of a comprehensive MRSA colonization surveillance program. It is not intended to diagnose MRSA infection nor to guide or monitor treatment for MRSA infections.      Studies: No results found.  Scheduled Meds: . sodium chloride   Intravenous Once  . sodium chloride   Intravenous Once  . citalopram  20 mg Oral q morning - 10a  . pantoprazole  40 mg Oral BID  . potassium chloride SA  20 mEq Oral Daily  . sodium chloride  3 mL Intravenous Q12H  . sodium chloride  3 mL Intravenous Q12H   Continuous Infusions:   Principal Problem:   Acute blood loss anemia Active Problems:   Colorectal cancer, stage III   Hypokalemia   Symptomatic  anemia    Time spent: 35 min    Kelvin Cellar  [name] Pager [phone]. If 7PM-7AM, please contact night-coverage at [email or web address], password [email or web address] 07/20/2015, 1:43 PM  LOS: 1 day

## 2015-07-20 NOTE — H&P (Signed)
[name] History and Physical  Mysty Kielty SVX:[id number] DOB: 1961/03/20 DOA: 07/19/2015  Referring physician:  PCP: [name], MD   Chief Complaint: GI bleed  HPI: Allison Mitchell is a 54 y.o. female with a past medical history of colon cancer, status post partial right hemicolectomy with removal of terminal ileum, sigmoid colectomy and right ureter lysis, status post chemoradiation therapy, who has had GI bleeding characterized by maroon-colored watery stool output over the past 7-10 days. She underwent upper endoscopy on 07/16/2015, procedure showing normal esophagus, stomach, duodenum, jejunum. She was seen in the office yesterday by Dr. Burr Medico found to have ongoing GI bleeding. Lab work revealed a hemoglobin of 7.4 for which Dr. Burr Medico recommended hospitalization to receive blood transfusion along with formal evaluation by interventional radiology regarding angiogram. She reports having generalized weakness, poor tolerance to physical exertion, and gets short of breath on occasion. She denies hematemesis, chest pain, palpitations, dizziness, lightheadedness, syncope. She denies NSAID or anticoagulate use.                                                        Review of Systems:  Constitutional:  No weight loss, night sweats, Fevers, chills, positive for fatigue, generalized weakness.  HEENT:  No headaches, Difficulty swallowing,Tooth/dental problems,Sore throat,  No sneezing, itching, ear ache, nasal congestion, post nasal drip,  Cardio-vascular:  No chest pain, Orthopnea, PND, swelling in lower extremities, anasarca, dizziness, palpitations  GI:  No heartburn, indigestion, abdominal pain, nausea, vomiting, diarrhea, change in bowel habits, loss of appetite  Resp:  Positive for shortness of breath with exertion or at rest. No excess mucus, no productive cough, No non-productive cough, No coughing up of blood.No change in color of mucus.No wheezing.No chest wall deformity    Skin:  no rash or lesions.  GU:  no dysuria, change in color of urine, no urgency or frequency. No flank pain.  Musculoskeletal:  No joint pain or swelling. No decreased range of motion. No back pain.  Psych:  No change in mood or affect. No depression or anxiety. No memory loss.   Past Medical History  Diagnosis Date  . GERD (gastroesophageal reflux disease)   . Depression   . Anxiety   . 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 Peripheral neuropathy due to chemotherapy   . Hydronephrosis, right     malignant--  s/p reimiplant right ureter  . History of pulmonary embolus (PE)     11-28-2014--  due to chemotherapy on coumadin  . Anticoagulated on Coumadin   . Colorectal cancer, stage III oncologist--  dr Stefani Dama    Dx  10-10-2014---Stage IIIC, [id number], Multifocal synchronous, Moderate differentiated Invasive, Mets to nodes (5 out of 17)/  s/p  Resection tumor cecum, terminal ileum, sigmoid/  currently chemoradiation therapy  . Dysuria   . S/P radiation therapy 03/02/15    completed pelvis/abd/50.4Gy  . Allergy     SEASONAL  . Blood transfusion without reported diagnosis    Past Surgical History  Procedure Laterality Date  . Cystoscopy with retrograde pyelogram, ureteroscopy and stent placement Right 10/05/2014    Procedure: [place], URETEROSCOPY  AND STENT PLACEMENT;  Surgeon: Alexis Frock, MD;  Location: [facility];  Service: Urology;  Laterality: Right;  . Laparotomy N/A 10/10/2014  Procedure: EXPLORATORY LAPAROTOMY;  Surgeon: Doreen Salvage, MD;  Location: [place];  Service: General;  Laterality: N/A;  . Partial colectomy  10/10/2014    Procedure: PARTIAL SIGMOID COLECTOMY;  Surgeon: Doreen Salvage, MD;  Location: [place];  Service: General;;  . Colostomy  10/10/2014    Procedure: COLOSTOMY;  Surgeon: Doreen Salvage, MD;  Location: [place];  Service: General;;  . Colostomy revision  10/10/2014    Procedure: PARTIAL RIGHT COLECTOMY;  Surgeon: Doreen Salvage, MD;  Location: [place];  Service: General;;  . Ureteral reimplantion Right 10/10/2014    Procedure: OPEN RIGHT URETERAL LYSIS ;  Surgeon: Alexis Frock, MD;  Location: [place];  Service: Urology;  Laterality: Right;  . Portacath placement N/A 10/17/2014    Procedure: INSERTION PORT-A-CATH LEFT SUBCLAVIAN AND MIDLINE INCISION STAPLE REMOVAL ;  Surgeon: Coralie Keens, MD;  Location: [place];  Service: General;  Laterality: N/A;  . Cystoscopy w/ retrogrades Right 01/10/2015    Procedure: CYSTOSCOPY WITH RETROGRADE PYELOGRAM;  Surgeon: Alexis Frock, MD;  Location: [facility];  Service: Urology;  Laterality: Right;  . Cystoscopy w/ ureteral stent placement Right 01/10/2015    Procedure: CYSTOSCOPY WITH STENT REPLACEMENT;  Surgeon: Alexis Frock, MD;  Location: [facility];  Service: Urology;  Laterality: Right;  . Colonoscopy    . Enteroscopy N/A 07/16/2015    Procedure: ENTEROSCOPY;  Surgeon: Gatha Mayer, MD;  Location: [place];  Service: Endoscopy;  Laterality: N/A;   Social History:  reports that she has never smoked. She has never used smokeless tobacco. She reports that she does not drink alcohol or use illicit drugs.  Allergies  Allergen Reactions  . Amoxicillin Other (See Comments)    Yeast infection    Family History  Problem Relation Age of Onset  . Cancer Mother     skin cancer   . Diabetes Father   . CAD Father     Onset in his 65s, has 7 stents  . Heart disease Father   . Cancer Maternal Grandmother 43    colon cancer   . Breast cancer Maternal Grandmother   . Colon cancer Maternal Grandmother   . Cancer Paternal Grandmother 40    breast cancer   . Breast cancer Paternal Grandmother      Prior to Admission medications   Medication Sig Start Date End Date Taking? Authorizing Provider  acetaminophen (TYLENOL) 325 MG tablet Take 325 mg by mouth every 6 (six) hours as needed for moderate pain.   Yes Historical Provider, MD    citalopram (CELEXA) 20 MG tablet Take 20 mg by mouth every morning.  09/18/14  Yes Historical Provider, MD  lidocaine-prilocaine (EMLA) cream Apply 1 application topically as needed. Patient taking differently: Apply 1 application topically as needed (port).  11/16/14  Yes Truitt Merle, MD  magnesium hydroxide (MILK OF MAGNESIA) 400 MG/5ML suspension Take 5 mLs by mouth daily as needed for mild constipation.   Yes Historical Provider, MD  omeprazole (PRILOSEC) 40 MG capsule Take 40 mg by mouth daily.   Yes Historical Provider, MD  ondansetron (ZOFRAN) 8 MG tablet Take 1 tablet (8 mg total) by mouth every 8 (eight) hours as needed for nausea or vomiting. 01/04/15  Yes Truitt Merle, MD  oxyCODONE (OXY IR/ROXICODONE) 5 MG immediate release tablet Take 1 tablet (5 mg total) by mouth every 6 (six) hours as needed for severe pain. 05/16/15  Yes Truitt Merle, MD  pantoprazole (PROTONIX) 40 MG tablet Take 1 tablet (  40 mg total) by mouth 2 (two) times daily. 06/14/15  Yes Truitt Merle, MD  potassium chloride SA (K-DUR,KLOR-CON) 20 MEQ tablet Take 1 tablet (20 mEq total) by mouth daily. Take as directed. 06/28/15  Yes Truitt Merle, MD  PREVIDENT 5000 BOOSTER PLUS 1.1 % PSTE 1 application by Other route daily.  03/20/15  Yes Historical Provider, MD  zolpidem (AMBIEN) 5 MG tablet Take 1 tablet (5 mg total) by mouth at bedtime as needed for sleep. 05/31/15  Yes Truitt Merle, MD   Physical Exam: Filed Vitals:   07/19/15 2325 07/19/15 2352 07/20/15 0135 07/20/15 0544  BP: 147/69 141/62 129/62 113/54  Pulse: 91 86 87 81  Temp: 98.1 F (36.7 C) 98.2 F (36.8 C) 98.3 F (36.8 C) 98.6 F (37 C)  TempSrc: Oral Oral Oral Oral  Resp: 16 16 14 16   SpO2: 99% 100% 98% 97%    Wt Readings from Last 3 Encounters:  07/19/15 94.257 kg (207 lb 12.8 oz)  07/12/15 93.033 kg (205 lb 1.6 oz)  07/10/15 91.173 kg (201 lb)    General:  Appears calm and comfortable, no acute distress. She is mentating well able to provide history Eyes: PERRL, normal  lids, irises & conjunctiva ENT: grossly normal hearing, lips & tongue Neck: no LAD, masses or thyromegaly Cardiovascular: RRR, no m/r/g. No LE edema. Telemetry: SR, no arrhythmias  Respiratory: CTA bilaterally, no w/r/r. Normal respiratory effort. Abdomen: soft, colostomy bag showing maroon colored fluid with clots, did not see gross blood. No stool was present. Skin: no rash or induration seen on limited exam Musculoskeletal: grossly normal tone BUE/BLE Psychiatric: grossly normal mood and affect, speech fluent and appropriate Neurologic: grossly non-focal.          Labs on Admission:  Basic Metabolic Panel:  Recent Labs Lab 07/19/15 0801 07/20/15 0520  NA 142 139  K 3.4* 3.3*  CL  --  106  CO2 25 27  GLUCOSE 149* 106*  BUN 13.2 14  CREATININE 1.1 1.05*  CALCIUM 8.4 7.7*   Liver Function Tests:  Recent Labs Lab 07/19/15 0801  AST 21  ALT 10  ALKPHOS 178*  BILITOT 0.57  PROT 5.1*  ALBUMIN 2.5*   No results for input(s): LIPASE, AMYLASE in the last 168 hours. No results for input(s): AMMONIA in the last 168 hours. CBC:  Recent Labs Lab 07/19/15 0801 07/20/15 0520  WBC 7.0 5.2  NEUTROABS 5.9  --   HGB 7.4* 7.5*  HCT 23.9* 23.3*  MCV 91.2 87.9  PLT 77* 73*   Cardiac Enzymes: No results for input(s): CKTOTAL, CKMB, CKMBINDEX, TROPONINI in the last 168 hours.  BNP (last 3 results) No results for input(s): BNP in the last 8760 hours.  ProBNP (last 3 results) No results for input(s): PROBNP in the last 8760 hours.  CBG: No results for input(s): GLUCAP in the last 168 hours.  Radiological Exams on Admission: No results found.  EKG: Independently reviewed.   Assessment/Plan Principal Problem:   Acute blood loss anemia Active Problems:   Colorectal cancer, stage III   Hypokalemia   Symptomatic anemia   1. Acute blood loss anemia. Patient having history of stage III colon cancer status post right hemicolectomy, presenting with ongoing GI bleed.  Lab work showing a hemoglobin of 7.4 with hematocrit of 23.9 in the office. She complains of generalized weakness, poor tolerance to physical exertion and exertional shortness of breath. She'll be typed and cross and transfuse with 2 units of packed red blood  cells. Repeat H&H post transfusion. Medications reviewed she is not on antiplatelets, NSAIDS, or anti-quite well therapy. 2. GI bleed. Unfortunately patient continues to have issues with GI bleed. On examination she had maroon colored fluid with associated clots in the colostomy bag. She'll be typed and cross and transfuse with 2 units of packed red blood cells. Earlier this week she had an endoscopic evaluation which did not reveal source of bleed. Both Dr. Burr Medico and myself discussed case with Dr.Hassel of interventional radiology regarding possibility of doing an angiogram. 3. Stage III colon cancer. Patient undergoing right hemicolectomy with colostomy placement, receiving her care at the cancer center where she has undergone adjuvant chemotherapy and radiation therapy. This will be restarted hopefully once GI bleed resolves. 4. History of pulmonary embolism. Likely related to underlying malignancy and hypercoagulable state. Previously on anticoagulation which was held due to GI bleeding. 5. Thrombocytopenia. Labs showing a platelet count of 77,000, likely secondary to previous chemotherapy. 6. Gastroesophageal reflux disease. Continue proton pump inhibitor therapy 7. DVT prophylaxis. SCDs    Code Status: Full code Family Communication: [place] not present family not present Disposition Plan: Will admit to the inpatient service, anticipate will require greater than 2 nights hospitalization  Time spent: 65 minutes  Kelvin Cellar [name] Pager [phone]

## 2015-07-20 NOTE — Progress Notes (Signed)
Patient ID: Allison Mitchell, female   DOB: [birth year], 54 y.o.   MRN: [id number] GI service contacted regarding negative NM tagged RBC scan on pt. She is familiar to our service from prior liver lesion biopsy in June 2016 revealing metastatic colon cancer. Per Dr. Kathlene Cote yield of performing mesenteric/visceral arteriogram at this time would be very low. Please contact him at [phone] with any additional questions.

## 2015-07-20 NOTE — Progress Notes (Signed)
Allison Mitchell   DOB:Nov 26, 1960   QM#:[id number]   BMW#:[id number]  Subjective: Patient is well-known to me,  She was admitted from my office yesterday for worsening GI bleeding and anemia. She received 2 units PRBC last night, per patient, she had large amount of red dark liquid output and colostomy bag around 6-7pm last night. Per her own participation, her GI bleeding gets worse after blood transfusion. She is getting additional 2 units RBC when I saw her around 6:00pm on the floor. Her RBC nuclear scan was negative for source of bleeding.   Objective:  Filed Vitals:   07/20/15 1615  BP: 134/69  Pulse: 92  Temp: 98.3 F (36.8 C)  Resp: 18    There is no weight on file to calculate BMI.  Intake/Output Summary (Last 24 hours) at 07/20/15 1742 Last data filed at 07/20/15 1600  Gross per 24 hour  Intake   1137 ml  Output      1 ml  Net   1136 ml     Sclerae unicteric  Oropharynx clear  No peripheral adenopathy  Lungs clear -- no rales or rhonchi  Heart regular rate and rhythm  Abdomen soft, (+) dark red liquid in colostomy bag  MSK no focal spinal tenderness, no peripheral edema  Neuro nonfocal    CBG (last 3)  No results for input(s): GLUCAP in the last 72 hours.   Labs:  Lab Results  Component Value Date   WBC 5.2 07/20/2015   HGB 7.5* 07/20/2015   HCT 23.3* 07/20/2015   MCV 87.9 07/20/2015   PLT 73* 07/20/2015   NEUTROABS 5.9 07/19/2015    @LASTCHEMISTRY @  Urine Studies No results for input(s): UHGB, CRYS in the last 72 hours.  Invalid input(s): UACOL, UAPR, USPG, UPH, UTP, UGL, UKET, UBIL, UNIT, UROB, ULEU, UEPI, UWBC, URBC, UBAC, CAST, UCOM, BILUA  Basic Metabolic Panel:  Recent Labs Lab 07/19/15 0801 07/20/15 0520  NA 142 139  K 3.4* 3.3*  CL  --  106  CO2 25 27  GLUCOSE 149* 106*  BUN 13.2 14  CREATININE 1.1 1.05*  CALCIUM 8.4 7.7*   GFR Estimated Creatinine Clearance: 73.6 mL/min (by C-G formula based on Cr of 1.05). Liver Function  Tests:  Recent Labs Lab 07/19/15 0801  AST 21  ALT 10  ALKPHOS 178*  BILITOT 0.57  PROT 5.1*  ALBUMIN 2.5*   No results for input(s): LIPASE, AMYLASE in the last 168 hours. No results for input(s): AMMONIA in the last 168 hours. Coagulation profile  Recent Labs Lab 07/19/15 0801  INR 1.00*  PROTIME 12.0    CBC:  Recent Labs Lab 07/19/15 0801 07/20/15 0520  WBC 7.0 5.2  NEUTROABS 5.9  --   HGB 7.4* 7.5*  HCT 23.9* 23.3*  MCV 91.2 87.9  PLT 77* 73*   Cardiac Enzymes: No results for input(s): CKTOTAL, CKMB, CKMBINDEX, TROPONINI in the last 168 hours. BNP: Invalid input(s): POCBNP CBG: No results for input(s): GLUCAP in the last 168 hours. D-Dimer No results for input(s): DDIMER in the last 72 hours. Hgb A1c No results for input(s): HGBA1C in the last 72 hours. Lipid Profile No results for input(s): CHOL, HDL, LDLCALC, TRIG, CHOLHDL, LDLDIRECT in the last 72 hours. Thyroid function studies No results for input(s): TSH, T4TOTAL, T3FREE, THYROIDAB in the last 72 hours.  Invalid input(s): FREET3 Anemia work up No results for input(s): VITAMINB12, FOLATE, FERRITIN, TIBC, IRON, RETICCTPCT in the last 72 hours. Microbiology Recent Results (from the past 240  hour(s))  Culture, body fluid-bottle     Status: None   Collection Time: 07/13/15 11:56 AM  Result Value Ref Range Status   Specimen Description FLUID PLEURAL  Final   Special Requests BAA 10CCS  Final   Culture NO GROWTH 5 DAYS  Final   Report Status 07/18/2015 FINAL  Final  Gram stain     Status: None   Collection Time: 07/13/15 11:56 AM  Result Value Ref Range Status   Specimen Description PLEURAL  Final   Special Requests NONE  Final   Gram Stain   Final    ABUNDANT WBC PRESENT,BOTH PMN AND MONONUCLEAR NO ORGANISMS SEEN    Report Status 07/13/2015 FINAL  Final  TECHNOLOGIST REVIEW     Status: None   Collection Time: 07/19/15  8:01 AM  Result Value Ref Range Status   Technologist Review rare rbc  fragment present  Final  MRSA PCR Screening     Status: None   Collection Time: 07/19/15  2:22 PM  Result Value Ref Range Status   MRSA by PCR NEGATIVE NEGATIVE Final    Comment:        The GeneXpert MRSA Assay (FDA approved for NASAL specimens only), is one component of a comprehensive MRSA colonization surveillance program. It is not intended to diagnose MRSA infection nor to guide or monitor treatment for MRSA infections.       Studies:  Nm Gi Blood Loss  07/20/2015   CLINICAL DATA:  Two month history of GI bleeding. Stool color has changed from black to red recently. 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  EXAM: NUCLEAR MEDICINE GASTROINTESTINAL BLEEDING SCAN  TECHNIQUE: Sequential abdominal images were obtained following intravenous administration of Tc-86m labeled red blood cells.  RADIOPHARMACEUTICALS:  20.0 mCi Tc-69m in-vitro labeled red cells.  COMPARISON:  No prior nuclear imaging. CT abdomen and pelvis 03/27/2015.  FINDINGS: No abnormal activity involving the abdomen or pelvis to suggest acute GI bleeding. Blood pool activity in the heart and vessels, as expected. Intense activity in the spleen and mild activity in the liver, as expected. Normal excretion into the urinary tract with activity in the bladder.  IMPRESSION: No evidence of active GI bleeding.   Electronically Signed   By: Evangeline Dakin M.D.   On: 07/20/2015 15:43    Assessment: 54 y.o.   1. GI bleeding, unclear source, RBC scan negative  2. Anemia, secondary to GI bleeding, chemo, rule out hemolysis (lab results pending) 3. Metastatic colon cancer  4 right side pleural effusion, s/p thoracentesis 9/5, cytology (-) 5. Thrombocytopenia, secondary to chemotherapy, consumption from bleeding 6. History of PE, off coumadin    Plan: -giving the worsening anemia, I have ordered  reticular count, LDH, and haptoglobin to ruled out hemolysis, especially transfusion related hemolysis, although I do not have high suspicion for hemolysis, given the normal bilirubin, and clear urine.  -per patient, her GI bleeding gets worse after blood transfusion, I really do not have a clear explanation to this phenomena. -I have spoken with GI Dr. Havery Moros this afternoon, regarding further GI work up for GI bleeding. Capsule Endoscopy is an option, although our biggest concern is that capsule Camera can be stuck in her bowel and require surgery if she has partial bowel obstruction. This was discussed with patient again,  she is willing to consider and understands the above risk. -NPO after midnight today, and  she will start bowel prep tonight, plan for capsule endoscopy if it is available tomorrow, Dr. Havery Moros will let her know in early morning, vs repeating endoscopy  -I told her floor nurse, if she experience significant GI bleeding, call on call hospitalist to contact IR and consider urgent angiogram  -continue supportive care, and transfuse RBC if hb<8 -although her moderate thrombocytopenia is unlikely the main reason for her GI bleeding, if she still has significant GI bleeding this weekend, please consider 1u plt transfusion if plt <100  I will ask my on call partner Dr. Jana Hakim to see her over the weekend, and I can be reached at my cell [phone] if needed.     Truitt Merle, MD 07/20/2015  5:42 PM

## 2015-07-20 NOTE — Consult Note (Signed)
Consultation  Referring [name]:   [facility]   Primary Care Physician:  [name], MD Primary Gastroenterologist:  Silvano Rusk, MD       Reason for Consultation: gi bleeding             HPI:   Allison Mitchell is a 54 y.o. female with a history of multifocal colon cancer, s/p chemoradiation and right hemicolectomy with colostomy.  Patient evaluated in our office mid August  For drop in EGD and dark stools on warfarin. EGD revealed only linear erosions at GE junction. She went on to have a colonoscopy and small bowel enteroscopy which were unremarkable. Patient has continued to overt GI bleeding which started out as dark stools but eventually turned into overt bleeding. She has required several transfusions. Patient was a direct admit by Oncologist yesterday. She has received two units of blood (last one at 1:30am today) and hgb only up from 7.4 to 7.5. Normal BUN. Bloody liquid in ostomy bag.   Patient endorses right mid abdominal cramping intermittently over the last few weeks. Cramps unrelated to meals. She had an episode of vomiting last night.    Past Medical History  Diagnosis Date  . GERD (gastroesophageal reflux disease)   . Depression   . Anxiety   . 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 Peripheral neuropathy due to chemotherapy   . Hydronephrosis, right     malignant--  s/p reimiplant right ureter  . History of pulmonary embolus (PE)     11-28-2014--  due to chemotherapy on coumadin  . Anticoagulated on Coumadin   . Colorectal cancer, stage III oncologist--  dr Stefani Dama    Dx  10-10-2014---Stage IIIC, [id number], Multifocal synchronous, Moderate differentiated Invasive, Mets to nodes (5 out of 17)/  s/p  Resection tumor cecum, terminal ileum, sigmoid/  currently chemoradiation therapy  . Dysuria   . S/P radiation therapy 03/02/15    completed pelvis/abd/50.4Gy  . Allergy     SEASONAL  . Blood transfusion without reported diagnosis       Past Surgical History  Procedure Laterality Date  . Cystoscopy with retrograde pyelogram, ureteroscopy and stent placement Right 10/05/2014    Procedure: [place], URETEROSCOPY  AND STENT PLACEMENT;  Surgeon: Alexis Frock, MD;  Location: [facility];  Service: Urology;  Laterality: Right;  . Laparotomy N/A 10/10/2014    Procedure: EXPLORATORY LAPAROTOMY;  Surgeon: Doreen Salvage, MD;  Location: [place];  Service: General;  Laterality: N/A;  . Partial colectomy  10/10/2014    Procedure: PARTIAL SIGMOID COLECTOMY;  Surgeon: Doreen Salvage, MD;  Location: [place];  Service: General;;  . Colostomy  10/10/2014    Procedure: COLOSTOMY;  Surgeon: Doreen Salvage, MD;  Location: [place];  Service: General;;  . Colostomy revision  10/10/2014    Procedure: PARTIAL RIGHT COLECTOMY;  Surgeon: Doreen Salvage, MD;  Location: [place];  Service: General;;  . Ureteral reimplantion Right 10/10/2014    Procedure: OPEN RIGHT URETERAL LYSIS ;  Surgeon: Alexis Frock, MD;  Location: [place];  Service: Urology;  Laterality: Right;  . Portacath placement N/A 10/17/2014    Procedure: INSERTION PORT-A-CATH LEFT SUBCLAVIAN AND MIDLINE INCISION STAPLE REMOVAL ;  Surgeon: Coralie Keens, MD;  Location: [place];  Service: General;  Laterality: N/A;  . Cystoscopy w/ retrogrades Right 01/10/2015    Procedure: CYSTOSCOPY WITH RETROGRADE PYELOGRAM;  Surgeon: Alexis Frock, MD;  Location: [facility];  Service:  Urology;  Laterality: Right;  . Cystoscopy w/ ureteral stent placement Right 01/10/2015    Procedure: CYSTOSCOPY WITH STENT REPLACEMENT;  Surgeon: Alexis Frock, MD;  Location: [facility];  Service: Urology;  Laterality: Right;  . Colonoscopy    . Enteroscopy N/A 07/16/2015    Procedure: ENTEROSCOPY;  Surgeon: Gatha Mayer, MD;  Location: [place];  Service: Endoscopy;  Laterality: N/A;    Family History  Problem Relation Age of Onset  . Cancer Mother     skin cancer   . Diabetes Father    . CAD Father     Onset in his 34s, has 7 stents  . Heart disease Father   . Cancer Maternal Grandmother 27    colon cancer   . Breast cancer Maternal Grandmother   . Colon cancer Maternal Grandmother   . Cancer Paternal Grandmother 74    breast cancer   . Breast cancer Paternal Grandmother     Social History  Substance Use Topics  . Smoking status: Never Smoker   . Smokeless tobacco: Never Used  . Alcohol Use: No    Prior to Admission medications   Medication Sig Start Date End Date Taking? Authorizing [name]  acetaminophen (TYLENOL) 325 MG tablet Take 325 mg by mouth every 6 (six) hours as needed for moderate pain.   Yes Historical Aruna Nestler, MD  citalopram (CELEXA) 20 MG tablet Take 20 mg by mouth every morning.  09/18/14  Yes Historical Lawrnce Reyez, MD  lidocaine-prilocaine (EMLA) cream Apply 1 application topically as needed. Patient taking differently: Apply 1 application topically as needed (port).  11/16/14  Yes Truitt Merle, MD  magnesium hydroxide (MILK OF MAGNESIA) 400 MG/5ML suspension Take 5 mLs by mouth daily as needed for mild constipation.   Yes Historical Marilyn Wing, MD  omeprazole (PRILOSEC) 40 MG capsule Take 40 mg by mouth daily.   Yes Historical Ciaira Natividad, MD  ondansetron (ZOFRAN) 8 MG tablet Take 1 tablet (8 mg total) by mouth every 8 (eight) hours as needed for nausea or vomiting. 01/04/15  Yes Truitt Merle, MD  oxyCODONE (OXY IR/ROXICODONE) 5 MG immediate release tablet Take 1 tablet (5 mg total) by mouth every 6 (six) hours as needed for severe pain. 05/16/15  Yes Truitt Merle, MD  pantoprazole (PROTONIX) 40 MG tablet Take 1 tablet (40 mg total) by mouth 2 (two) times daily. 06/14/15  Yes Truitt Merle, MD  potassium chloride SA (K-DUR,KLOR-CON) 20 MEQ tablet Take 1 tablet (20 mEq total) by mouth daily. Take as directed. 06/28/15  Yes Truitt Merle, MD  PREVIDENT 5000 BOOSTER PLUS 1.1 % PSTE 1 application by Other route daily.  03/20/15  Yes Historical Ardella Chhim, MD  zolpidem (AMBIEN) 5 MG  tablet Take 1 tablet (5 mg total) by mouth at bedtime as needed for sleep. 05/31/15  Yes Truitt Merle, MD    Current Facility-Administered Medications  Medication Dose Route Frequency Thana Ramp Last Rate Last Dose  . 0.9 %  sodium chloride infusion  250 mL Intravenous PRN Kelvin Cellar, MD      . 0.9 %  sodium chloride infusion   Intravenous Once Kelvin Cellar, MD      . 0.9 %  sodium chloride infusion   Intravenous Once Kelvin Cellar, MD      . acetaminophen (TYLENOL) tablet 650 mg  650 mg Oral Q6H PRN Kelvin Cellar, MD       Or  . acetaminophen (TYLENOL) suppository 650 mg  650 mg Rectal Q6H PRN Kelvin Cellar, MD      .  alum & mag hydroxide-simeth (MAALOX/MYLANTA) 200-200-20 MG/5ML suspension 30 mL  30 mL Oral Q6H PRN Kelvin Cellar, MD      . citalopram (CELEXA) tablet 20 mg  20 mg Oral q morning - 10a Kelvin Cellar, MD      . ondansetron [facility]) tablet 4 mg  4 mg Oral Q6H PRN Kelvin Cellar, MD       Or  . ondansetron (ZOFRAN) injection 4 mg  4 mg Intravenous Q6H PRN Kelvin Cellar, MD      . oxyCODONE (Oxy IR/ROXICODONE) immediate release tablet 5 mg  5 mg Oral Q6H PRN Kelvin Cellar, MD   5 mg at 07/19/15 2357  . pantoprazole (PROTONIX) EC tablet 40 mg  40 mg Oral BID Kelvin Cellar, MD   40 mg at 07/19/15 1558  . potassium chloride SA (K-DUR,KLOR-CON) CR tablet 20 mEq  20 mEq Oral Daily Kelvin Cellar, MD   20 mEq at 07/19/15 1559  . sodium chloride 0.9 % injection 3 mL  3 mL Intravenous Q12H Kelvin Cellar, MD   3 mL at 07/19/15 2200  . sodium chloride 0.9 % injection 3 mL  3 mL Intravenous Q12H Kelvin Cellar, MD   3 mL at 07/19/15 2200  . sodium chloride 0.9 % injection 3 mL  3 mL Intravenous PRN Kelvin Cellar, MD      . zolpidem (AMBIEN) tablet 5 mg  5 mg Oral QHS PRN Kelvin Cellar, MD   5 mg at 07/19/15 2357   Facility-Administered Medications Ordered in Other Encounters  Medication Dose Route Frequency Joneric Streight Last Rate Last Dose  . sodium chloride 0.9 %  injection 10 mL  10 mL Intracatheter PRN Truitt Merle, MD        Allergies as of 07/19/2015 - Review Complete 07/19/2015  Allergen Reaction Noted  . Amoxicillin Other (See Comments) 11/28/2014    Review of Systems:    As per HPI, otherwise negative   Physical Exam:  Vital signs in last 24 hours: Temp:  [98.1 F (36.7 C)-99.1 F (37.3 C)] 98.6 F (37 C) (09/16 0544) Pulse Rate:  [81-100] 81 (09/16 0544) Resp:  [14-16] 16 (09/16 0544) BP: (113-147)/(54-69) 113/54 mmHg (09/16 0544) SpO2:  [97 %-100 %] 97 % (09/16 0544) Last BM Date: 07/19/15 (bleeding) General:   Pleasant white female in NAD Head:  Normocephalic and atraumatic. Eyes:   No icterus.   Conjunctiva pink. Ears:  Normal auditory acuity. Neck:  Supple Lungs:  Respirations even and unlabored. Lungs clear to auscultation bilaterally.   No wheezes, crackles, or rhonchi.  Heart:  Regular rate and rhythm Abdomen:  Soft, nondistended, nontender. Normal bowel sounds. No appreciable masses. Thin bloody liquid in ostomy Rectal:  Not performed.  Msk:  Symmetrical without gross deformities.  Extremities:  Without edema. Neurologic:  Alert and  oriented x4;  grossly normal neurologically. Skin:  Intact without significant lesions or rashes. Psych:  Alert and cooperative. Normal affect.  LAB RESULTS:  Recent Labs  07/19/15 0801 07/20/15 0520  WBC 7.0 5.2  HGB 7.4* 7.5*  HCT 23.9* 23.3*  PLT 77* 73*   BMET  Recent Labs  07/19/15 0801 07/20/15 0520  NA 142 139  K 3.4* 3.3*  CL  --  106  CO2 25 27  GLUCOSE 149* 106*  BUN 13.2 14  CREATININE 1.1 1.05*  CALCIUM 8.4 7.7*   LFT  Recent Labs  07/19/15 0801  PROT 5.1*  ALBUMIN 2.5*  AST 21  ALT 10  ALKPHOS 178*  BILITOT  0.57   PT/INR  Recent Labs  07/19/15 0801  INR 1.00*    PREVIOUS ENDOSCOPIES:             Small bowel enteroscopy 07/16/15  IMPRESSIONS: 1) Normal esophagus, stomach, duodenum and deep into proximal jejunum - cause of bleeding not  found RECOMMENDATIONS: Will consider capsule endoscopy if she would be an operative candidate - other options would be arteriogram and possible embolization, balloon deep enteroscopy at [place] [facility]  Colonoscopy 07/10/15   ENDOSCOPIC IMPRESSION: 1.   There was evidence of a prior ileocolonic surgical anastomosis in the right colon and a sigmoid colostomy 2.   The colonic mucosa appeared normal throughout the entire examined colon RECOMMENDATIONS: Cause of bleeding not clear, nothing seen here- had esophagitis on EGD. will discuss furher eval with Dr.  Burr Medico   EGD 06/19/15  ENDOSCOPIC IMPRESSION: 1) 2 areas of linear erosions at GE junction - biopsied 2) small hiatal hernia 3) Otherwise normal EGD RECOMMENDATIONS: Restart warfarin 2x NL dose tonight then normal dose tomorrow and ongoing Have INR check next week w/ Dr. Burr Medico stay on pantoprazole will call results     Impression / Plan:   26. 54 year old female with a history of multifocal colon cancer at terminal ileum / cecum, ascending colon and sigmoid colon. Cecal tumor had squamous differentiation. She is s/p chemoradiation and partial right hemicolectomy Dec 2015. Cecal tumor invaded right ureter. She has mpT4N2aMO stage IIIB cancer. IR is to evaluate for possible angiogram.    2. Right ureteral stent placement for GU invasion   3. Right pleural effusion, s/p thoracentesis last week, cytology negative   3. PE diagnosed Jan 2015. Coumadin on hold. INR 1.0  4. Thrombocytopenia, platelets 77. Probably related to chemo (and bleeding)  Thanks   LOS: 1 day   Tye Savoy  07/20/2015, 9:02 AM    Attending Addendum: I have taken an interval history, reviewed the chart, and examined the patient. I agree with the Advanced Practitioner's note and impression. Patient has undergone EGD, colonoscopy, and small bowel enteroscopy without clear source of bleeding identified. She has active bleeding into the ostomy noted on exam  this morning. She has not responded appropriately to PRBC transfusion. Agree with IR evaluation at this point initially to help localize the bleeding source and embolize if possible given failure of recent endoscopy to localize the source. We are standing by to assist as needed otherwise pending IR evaluation.  Burleigh Cellar, MD [facility]enter[facility] Pager [phone]

## 2015-07-21 LAB — CBC
HCT: 28.2 % — ABNORMAL LOW (ref 36.0–46.0)
HEMATOCRIT: 28.1 % — AB (ref 36.0–46.0)
HEMATOCRIT: 27.7 % — AB (ref 36.0–46.0)
HEMOGLOBIN: 9.3 g/dL — AB (ref 12.0–15.0)
HEMOGLOBIN: 9.1 g/dL — AB (ref 12.0–15.0)
Hemoglobin: 9.3 g/dL — ABNORMAL LOW (ref 12.0–15.0)
MCH: 29.1 pg (ref 26.0–34.0)
MCH: 28.9 pg (ref 26.0–34.0)
MCH: 29 pg (ref 26.0–34.0)
MCHC: 33.1 g/dL (ref 30.0–36.0)
MCHC: 32.9 g/dL (ref 30.0–36.0)
MCHC: 33 g/dL (ref 30.0–36.0)
MCV: 87.8 fL (ref 78.0–100.0)
MCV: 87.9 fL (ref 78.0–100.0)
MCV: 87.9 fL (ref 78.0–100.0)
PLATELETS: 66 10*3/uL — AB (ref 150–400)
PLATELETS: 69 10*3/uL — AB (ref 150–400)
Platelets: 71 10*3/uL — ABNORMAL LOW (ref 150–400)
RBC: 3.2 MIL/uL — AB (ref 3.87–5.11)
RBC: 3.15 MIL/uL — ABNORMAL LOW (ref 3.87–5.11)
RBC: 3.21 MIL/uL — AB (ref 3.87–5.11)
RDW: 17.1 % — ABNORMAL HIGH (ref 11.5–15.5)
RDW: 17.3 % — AB (ref 11.5–15.5)
RDW: 17.3 % — AB (ref 11.5–15.5)
WBC: 6.3 10*3/uL (ref 4.0–10.5)
WBC: 5.2 10*3/uL (ref 4.0–10.5)
WBC: 5.1 10*3/uL (ref 4.0–10.5)

## 2015-07-21 LAB — BASIC METABOLIC PANEL
ANION GAP: 6 (ref 5–15)
BUN: 11 mg/dL (ref 6–20)
CHLORIDE: 107 mmol/L (ref 101–111)
CO2: 26 mmol/L (ref 22–32)
Calcium: 7.8 mg/dL — ABNORMAL LOW (ref 8.9–10.3)
Creatinine, Ser: 1.02 mg/dL — ABNORMAL HIGH (ref 0.44–1.00)
GFR calc non Af Amer: >60 mL/min (ref >60)
GLUCOSE: 103 mg/dL — AB (ref 65–99)
Potassium: 3.6 mmol/L (ref 3.5–5.1)
Sodium: 139 mmol/L (ref 135–145)

## 2015-07-21 LAB — HAPTOGLOBIN: Haptoglobin: 211 mg/dL — ABNORMAL HIGH (ref 34–200)

## 2015-07-21 LAB — RETICULOCYTES
RBC.: 3.2 MIL/uL — AB (ref 3.87–5.11)
RETIC COUNT ABSOLUTE: 128 10*3/uL (ref 19.0–186.0)
RETIC CT PCT: 4 % — AB (ref 0.4–3.1)

## 2015-07-21 LAB — FIBRINOGEN: Fibrinogen: 524 mg/dL — ABNORMAL HIGH (ref 204–475)

## 2015-07-21 LAB — PREPARE RBC (CROSSMATCH)

## 2015-07-21 LAB — PROTIME-INR
INR: 1.07 (ref 0.00–1.49)
Prothrombin Time: 14.1 seconds (ref 11.6–15.2)

## 2015-07-21 MED ORDER — SODIUM CHLORIDE 0.9 % IV SOLN
250.0000 mL | INTRAVENOUS | Status: DC | PRN
  Administered 2015-07-21: 75 mL via INTRAVENOUS

## 2015-07-21 MED ORDER — PROMETHAZINE HCL 25 MG RE SUPP: 25.0000 mg | Freq: Three times a day (TID) | RECTAL | Status: DC | PRN

## 2015-07-21 MED ORDER — PANTOPRAZOLE SODIUM 40 MG IV SOLR
40.0000 mg | Freq: Two times a day (BID) | INTRAVENOUS | Status: DC
  Administered 2015-07-21 – 2015-07-27 (×13): 40 mg via INTRAVENOUS
  Filled 2015-07-21 (×13): qty 40

## 2015-07-21 MED ORDER — SODIUM CHLORIDE 0.9 % IV SOLN: Freq: Once | INTRAVENOUS | Status: AC

## 2015-07-21 MED ORDER — POLYETHYLENE GLYCOL 3350 17 GM/SCOOP PO POWD
1.0000 | Freq: Once | ORAL | Status: AC
  Administered 2015-07-21: 127 g via ORAL
  Filled 2015-07-21: qty 255

## 2015-07-21 NOTE — Progress Notes (Signed)
Progress Note   Subjective  right mid abdominal cramps. Couldn't tolerate prep, make her nauseated.    Objective   Vital signs in last 24 hours: Temp:  [98.3 F (36.8 C)-99.2 F (37.3 C)] 98.5 F (36.9 C) (09/17 0509) Pulse Rate:  [80-92] 80 (09/17 0509) Resp:  [14-18] 14 (09/17 0509) BP: (126-145)/(58-73) 126/61 mmHg (09/17 0509) SpO2:  [97 %-99 %] 97 % (09/17 0509) Last BM Date: 07/20/15 General:    white female in NAD Heart:  Regular rate and rhythm Abdomen:  Soft, nontender and nondistended. Normal bowel sounds. Dark red liquid stool in ostomy Extremities:  Without edema. Neurologic:  Alert and oriented,  grossly normal neurologically. Psych:  Cooperative. Normal mood and affect.    Lab Results:  Recent Labs  07/20/15 0520 07/21/15 0139 07/21/15 0607  WBC 5.2 6.3 5.2  HGB 7.5* 9.3* 9.1*  HCT 23.3* 28.1* 27.7*  PLT 73* 66* 71*   BMET  Recent Labs  07/19/15 0801 07/20/15 0520 07/21/15 0607  NA 142 139 139  K 3.4* 3.3* 3.6  CL  --  106 107  CO2 25 27 26   GLUCOSE 149* 106* 103*  BUN 13.2 14 11   CREATININE 1.1 1.05* 1.02*  CALCIUM 8.4 7.7* 7.8*   LFT  Recent Labs  07/19/15 0801  PROT 5.1*  ALBUMIN 2.5*  AST 21  ALT 10  ALKPHOS 178*  BILITOT 0.57   PT/INR  Recent Labs  07/19/15 0801 07/21/15 0139  LABPROT  --  14.1  INR 1.00* 1.07    Studies/Results: Nm Gi Blood Loss  07/20/2015   CLINICAL DATA:  Two month history of GI bleeding. Stool color has changed from black to red recently. 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  EXAM: NUCLEAR MEDICINE GASTROINTESTINAL BLEEDING SCAN  TECHNIQUE: Sequential abdominal images were obtained following intravenous administration of Tc-61m labeled red blood cells.  RADIOPHARMACEUTICALS:  20.0 mCi Tc-35m in-vitro labeled red cells.  COMPARISON:  No prior nuclear imaging. CT  abdomen and pelvis 03/27/2015.  FINDINGS: No abnormal activity involving the abdomen or pelvis to suggest acute GI bleeding. Blood pool activity in the heart and vessels, as expected. Intense activity in the spleen and mild activity in the liver, as expected. Normal excretion into the urinary tract with activity in the bladder.  IMPRESSION: No evidence of active GI bleeding.   Electronically Signed   By: Evangeline Dakin M.D.   On: 07/20/2015 15:43     Assessment / Plan:    86. 54 year old female with a history of multifocal colon cancer at terminal ileum / cecum, ascending colon and sigmoid colon. Cecal tumor had squamous differentiation. She is s/p chemoradiation and partial right hemicolectomy / colostomy Dec 2015. She has mpT4N2aMO stage IIIB cancer.   Patient having overt GI bleeding for several weeks, cause still unknown. Bleeding scan yesterday negative. She couldn't tolerate a prep for endo capsule so will cancel that for today. May need to place NGT to get prep down. Will discuss with her.   2. Anemia of acute blood loss, she got 2 more units yesterday. Hgb has finally improved (9.1). Will drop again given active bleeding.     LOS: 2 days   Tye Savoy  07/21/2015, 10:34 AM   Attending Addendum: I have taken an interval history and examined the patient. I agree with the Advanced Practitioner's note and impression. The  patient unfortunately did not tolerate Moviprep overnight and could not complete her capsule today. She continues to have maroon colored blood out of the ostomy. She reports she has tolerated a miralax/gatorade prep in the past and can drink this, so will prep this evening and tomorrow AM and place capsule tomorrow late morning. Should have a result on Monday. If she has significant output in the interim, would call radiology to see if she can have another tagged RBC scan as this would be quite helpful if able to localize the source. I offered her another enteroscopy and  colonoscopy, although she does not want this unless her capsule is negative. If capsule is negative, would recommend another enteroscopy / colonoscopy if she continues to have active bleeding if radiology will not entertain another tagged RBC scan.   Rebecca Cellar, MD [facility] Gastroenterology Pager [phone]

## 2015-07-21 NOTE — Progress Notes (Signed)
Pt was not able to tolerate Moviprep. On call provider made aware. Pt has been NPO as of 9/16, 2100.  Pt also stated she made four trips to bathroom to empty ostomy bag, each time dark red and watery.  Assessed ostomy bag, full of air with dark red residue. Pt skin color is pale, alert and oriented X 4.

## 2015-07-21 NOTE — Progress Notes (Signed)
Allison Mitchell   DOB:28-Dec-1960   XT#:[id number]   OEV#:[id number]  Subjective: tolerated transfusions yesterday w/o event. Had more abdominal cramps overnight. Tried to take the endoscopy capsule but gagged. Hoping to be able to eat sometime today. No family in room   Objective: middle aged White woman exmained in bed Filed Vitals:   07/21/15 0509  BP: 126/61  Pulse: 80  Temp: 98.5 F (36.9 C)  Resp: 14    There is no weight on file to calculate BMI.  Intake/Output Summary (Last 24 hours) at 07/21/15 0957 Last data filed at 07/21/15 0900  Gross per 24 hour  Intake    700 ml  Output      0 ml  Net    700 ml   Bloody/red-brownish liquid in colostomy bag (about 75 cc0  CBG (last 3)  No results for input(s): GLUCAP in the last 72 hours.   Labs:  Lab Results  Component Value Date   WBC 5.2 07/21/2015   HGB 9.1* 07/21/2015   HCT 27.7* 07/21/2015   MCV 87.9 07/21/2015   PLT 71* 07/21/2015   NEUTROABS 5.9 07/19/2015    @LASTCHEMISTRY @  Urine Studies No results for input(s): UHGB, CRYS in the last 72 hours.  Invalid input(s): UACOL, UAPR, USPG, UPH, UTP, UGL, UKET, UBIL, UNIT, UROB, ULEU, UEPI, UWBC, URBC, UBAC, CAST, [place], [place]  Basic Metabolic Panel:  Recent Labs Lab 07/19/15 0801 07/20/15 0520 07/21/15 0607  NA 142 139 139  K 3.4* 3.3* 3.6  CL  --  106 107  CO2 25 27 26   GLUCOSE 149* 106* 103*  BUN 13.2 14 11   CREATININE 1.1 1.05* 1.02*  CALCIUM 8.4 7.7* 7.8*   GFR Estimated Creatinine Clearance: 75.7 mL/min (by C-G formula based on Cr of 1.02). Liver Function Tests:  Recent Labs Lab 07/19/15 0801  AST 21  ALT 10  ALKPHOS 178*  BILITOT 0.57  PROT 5.1*  ALBUMIN 2.5*   No results for input(s): LIPASE, AMYLASE in the last 168 hours. No results for input(s): AMMONIA in the last 168 hours. Coagulation profile  Recent Labs Lab 07/19/15 0801 07/21/15 0139  INR 1.00* 1.07  PROTIME 12.0  --     CBC:  Recent Labs Lab 07/19/15 0801  07/20/15 0520 07/21/15 0139 07/21/15 0607  WBC 7.0 5.2 6.3 5.2  NEUTROABS 5.9  --   --   --   HGB 7.4* 7.5* 9.3* 9.1*  HCT 23.9* 23.3* 28.1* 27.7*  MCV 91.2 87.9 87.8 87.9  PLT 77* 73* 66* 71*   Cardiac Enzymes: No results for input(s): CKTOTAL, CKMB, CKMBINDEX, TROPONINI in the last 168 hours. BNP: Invalid input(s): POCBNP CBG: No results for input(s): GLUCAP in the last 168 hours. D-Dimer No results for input(s): DDIMER in the last 72 hours. Hgb A1c No results for input(s): HGBA1C in the last 72 hours. Lipid Profile No results for input(s): CHOL, HDL, LDLCALC, TRIG, CHOLHDL, LDLDIRECT in the last 72 hours. Thyroid function studies No results for input(s): TSH, T4TOTAL, T3FREE, THYROIDAB in the last 72 hours.  Invalid input(s): FREET3 Anemia work up  Recent Labs  07/21/15 0139  RETICCTPCT 4.0*   Microbiology Recent Results (from the past 240 hour(s))  Culture, body fluid-bottle     Status: None   Collection Time: 07/13/15 11:56 AM  Result Value Ref Range Status   Specimen Description FLUID PLEURAL  Final   Special Requests BAA 10CCS  Final   Culture NO GROWTH 5 DAYS  Final   Report Status  07/18/2015 FINAL  Final  Gram stain     Status: None   Collection Time: 07/13/15 11:56 AM  Result Value Ref Range Status   Specimen Description PLEURAL  Final   Special Requests NONE  Final   Gram Stain   Final    ABUNDANT WBC PRESENT,BOTH PMN AND MONONUCLEAR NO ORGANISMS SEEN    Report Status 07/13/2015 FINAL  Final  TECHNOLOGIST REVIEW     Status: None   Collection Time: 07/19/15  8:01 AM  Result Value Ref Range Status   Technologist Review rare rbc fragment present  Final  MRSA PCR Screening     Status: None   Collection Time: 07/19/15  2:22 PM  Result Value Ref Range Status   MRSA by PCR NEGATIVE NEGATIVE Final    Comment:        The GeneXpert MRSA Assay (FDA approved for NASAL specimens only), is one component of a comprehensive MRSA colonization surveillance  program. It is not intended to diagnose MRSA infection nor to guide or monitor treatment for MRSA infections.       Studies:  Nm Gi Blood Loss  07/20/2015   CLINICAL DATA:  Two month history of GI bleeding. Stool color has changed from black to red recently. 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  EXAM: NUCLEAR MEDICINE GASTROINTESTINAL BLEEDING SCAN  TECHNIQUE: Sequential abdominal images were obtained following intravenous administration of Tc-85m labeled red blood cells.  RADIOPHARMACEUTICALS:  20.0 mCi Tc-66m in-vitro labeled red cells.  COMPARISON:  No prior nuclear imaging. CT abdomen and pelvis 03/27/2015.  FINDINGS: No abnormal activity involving the abdomen or pelvis to suggest acute GI bleeding. Blood pool activity in the heart and vessels, as expected. Intense activity in the spleen and mild activity in the liver, as expected. Normal excretion into the urinary tract with activity in the bladder.  IMPRESSION: No evidence of active GI bleeding.   Electronically Signed   By: Evangeline Dakin M.D.   On: 07/20/2015 15:43    Assessment: 54 y.o. [place] woman with a history of colon cancer, documented stage IV by liver biopsy 04/16/2015, admitted 07/19/2015 with recurrent GIB  (1) she is currently day 19 cycle 10 FOLFOX, with adequate WBC and moderately decreased platelets (>70K)  (2) s/p transfusion 2 units PRBCs completed 07/21/2015 at 1 AM  (3) reticulocyte count shows vigorous response at 128; LDH and haptoglobin are both WNL, not c/w hemolysis  Plan: Waiting on GI re how to proceed with workup.   Will monitor CBC TID and transfuse as needed. Alerted blood bank to keep 2 units ahead. Added phenergan suppository if she is to try the capsule again-- should help with the gagging problem.  There is no evidence of hemolysis. Patient's morrow is  responding normally to blood loss. If GIB stops we should see Hb rise over next 2 weeks   Chauncey Cruel, MD 07/21/2015  9:57 AM Medical Oncology and Hematology [facility] [address] [place], [place] [address] Tel. [phone]    Fax. [phone]

## 2015-07-22 ENCOUNTER — Encounter (HOSPITAL_COMMUNITY)
Admission: AD | Disposition: A | Discharge status: Home Health | Payer: Self-pay | Source: Ambulatory Visit | Attending: Internal Medicine

## 2015-07-22 HISTORY — PX: GIVENS CAPSULE STUDY: SHX5432

## 2015-07-22 LAB — CBC
HCT: 26.1 % — ABNORMAL LOW (ref 36.0–46.0)
Hemoglobin: 8.5 g/dL — ABNORMAL LOW (ref 12.0–15.0)
MCH: 28.8 pg (ref 26.0–34.0)
MCHC: 32.6 g/dL (ref 30.0–36.0)
MCV: 88.5 fL (ref 78.0–100.0)
PLATELETS: 85 10*3/uL — AB (ref 150–400)
RBC: 2.95 MIL/uL — ABNORMAL LOW (ref 3.87–5.11)
RDW: 17.3 % — AB (ref 11.5–15.5)
WBC: 4 10*3/uL (ref 4.0–10.5)

## 2015-07-22 SURGERY — IMAGING PROCEDURE, GI TRACT, INTRALUMINAL, VIA CAPSULE

## 2015-07-22 MED ORDER — OXYCODONE HCL 5 MG PO TABS
5.0000 mg | ORAL_TABLET | ORAL | Status: DC | PRN
  Administered 2015-07-22 – 2015-07-23 (×3): 5 mg via ORAL
  Filled 2015-07-22 (×3): qty 1

## 2015-07-22 NOTE — Progress Notes (Signed)
Allison Mitchell   DOB:[birth year]   HR#:[id number]   IWO#:[id number]  Subjective: continues to put out bloody liquid through the bag--about "2 cups" that accumulated overnight, then about "1 cup" about 10 AM. Tolerating the prep well so far, planning to swallow capsule for endoscopy around noon. Not getting OOB much because "I bleed more when I'm active." No family in room   Objective: middle aged White woman exmained in bed Filed Vitals:   07/22/15 0528  BP: 147/56  Pulse: 81  Temp: 98.2 F (36.8 C)  Resp: 16    There is no weight on file to calculate BMI.  Intake/Output Summary (Last 24 hours) at 07/22/15 1058 Last data filed at 07/22/15 1030  Gross per 24 hour  Intake      3 ml  Output    240 ml  Net   -237 ml   Oropharynx no thrush Lungs no rales or rhonchi-- auscultated anterolaterally Heart RRR Colostomy bag shows about 30 cc bloody fluid (recently emptied) Minimal ankle edema  CBG (last 3)  No results for input(s): GLUCAP in the last 72 hours.   Labs:  Lab Results  Component Value Date   WBC 5.1 07/21/2015   HGB 9.3* 07/21/2015   HCT 28.2* 07/21/2015   MCV 87.9 07/21/2015   PLT 69* 07/21/2015   NEUTROABS 5.9 07/19/2015    @LASTCHEMISTRY @  Urine Studies No results for input(s): UHGB, CRYS in the last 72 hours.  Invalid input(s): UACOL, UAPR, USPG, UPH, UTP, UGL, UKET, UBIL, UNIT, UROB, ULEU, UEPI, UWBC, URBC, UBAC, CAST, [place], [place]  Basic Metabolic Panel:  Recent Labs Lab 07/19/15 0801 07/20/15 0520 07/21/15 0607  NA 142 139 139  K 3.4* 3.3* 3.6  CL  --  106 107  CO2 25 27 26   GLUCOSE 149* 106* 103*  BUN 13.2 14 11   CREATININE 1.1 1.05* 1.02*  CALCIUM 8.4 7.7* 7.8*   GFR Estimated Creatinine Clearance: 75.7 mL/min (by C-G formula based on Cr of 1.02). Liver Function Tests:  Recent Labs Lab 07/19/15 0801  AST 21  ALT 10  ALKPHOS 178*  BILITOT 0.57  PROT 5.1*  ALBUMIN 2.5*   No results for input(s): LIPASE, AMYLASE in the last 168  hours. No results for input(s): AMMONIA in the last 168 hours. Coagulation profile  Recent Labs Lab 07/19/15 0801 07/21/15 0139  INR 1.00* 1.07  PROTIME 12.0  --     CBC:  Recent Labs Lab 07/19/15 0801 07/20/15 0520 07/21/15 0139 07/21/15 0607 07/21/15 1350  WBC 7.0 5.2 6.3 5.2 5.1  NEUTROABS 5.9  --   --   --   --   HGB 7.4* 7.5* 9.3* 9.1* 9.3*  HCT 23.9* 23.3* 28.1* 27.7* 28.2*  MCV 91.2 87.9 87.8 87.9 87.9  PLT 77* 73* 66* 71* 69*   Cardiac Enzymes: No results for input(s): CKTOTAL, CKMB, CKMBINDEX, TROPONINI in the last 168 hours. BNP: Invalid input(s): POCBNP CBG: No results for input(s): GLUCAP in the last 168 hours. D-Dimer No results for input(s): DDIMER in the last 72 hours. Hgb A1c No results for input(s): HGBA1C in the last 72 hours. Lipid Profile No results for input(s): CHOL, HDL, LDLCALC, TRIG, CHOLHDL, LDLDIRECT in the last 72 hours. Thyroid function studies No results for input(s): TSH, T4TOTAL, T3FREE, THYROIDAB in the last 72 hours.  Invalid input(s): FREET3 Anemia work up  Recent Labs  07/21/15 0139  RETICCTPCT 4.0*   Microbiology Recent Results (from the past 240 hour(s))  Culture, body fluid-bottle  Status: None   Collection Time: 07/13/15 11:56 AM  Result Value Ref Range Status   Specimen Description FLUID PLEURAL  Final   Special Requests BAA 10CCS  Final   Culture NO GROWTH 5 DAYS  Final   Report Status 07/18/2015 FINAL  Final  Gram stain     Status: None   Collection Time: 07/13/15 11:56 AM  Result Value Ref Range Status   Specimen Description PLEURAL  Final   Special Requests NONE  Final   Gram Stain   Final    ABUNDANT WBC PRESENT,BOTH PMN AND MONONUCLEAR NO ORGANISMS SEEN    Report Status 07/13/2015 FINAL  Final  TECHNOLOGIST REVIEW     Status: None   Collection Time: 07/19/15  8:01 AM  Result Value Ref Range Status   Technologist Review rare rbc fragment present  Final  MRSA PCR Screening     Status: None    Collection Time: 07/19/15  2:22 PM  Result Value Ref Range Status   MRSA by PCR NEGATIVE NEGATIVE Final    Comment:        The GeneXpert MRSA Assay (FDA approved for NASAL specimens only), is one component of a comprehensive MRSA colonization surveillance program. It is not intended to diagnose MRSA infection nor to guide or monitor treatment for MRSA infections.       Studies:  Nm Gi Blood Loss  07/20/2015   CLINICAL DATA:  Two month history of GI bleeding. Stool color has changed from black to red recently. 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  EXAM: NUCLEAR MEDICINE GASTROINTESTINAL BLEEDING SCAN  TECHNIQUE: Sequential abdominal images were obtained following intravenous administration of Tc-4m labeled red blood cells.  RADIOPHARMACEUTICALS:  20.0 mCi Tc-74m in-vitro labeled red cells.  COMPARISON:  No prior nuclear imaging. CT abdomen and pelvis 03/27/2015.  FINDINGS: No abnormal activity involving the abdomen or pelvis to suggest acute GI bleeding. Blood pool activity in the heart and vessels, as expected. Intense activity in the spleen and mild activity in the liver, as expected. Normal excretion into the urinary tract with activity in the bladder.  IMPRESSION: No evidence of active GI bleeding.   Electronically Signed   By: Evangeline Dakin M.D.   On: 07/20/2015 15:43    Assessment: 54 y.o. [name] woman with a history of colon cancer, documented stage IV by liver biopsy 04/16/2015, admitted 07/19/2015 with recurrent GIB  (1) she is currently day 19 cycle 10 FOLFOX, with adequate WBC and moderately decreased platelets (>70K)  (2) s/p transfusion 2 units PRBCs completed 07/21/2015 at 1 AM  (3) reticulocyte count shows vigorous response at 128; LDH and haptoglobin are both WNL, not c/w hemolysis  (4) continuing production of bloody fluid but  stable Hb  Plan:  She is tolerating the prep well today and hopefully can accomplish the capsule endoscopy study as planned.  Will continue to follow CBC Q 12 h for now. Have contacted blood bank to assure 2 untis available if transfusion needed next 24 h  Chauncey Cruel, MD 07/22/2015  10:58 AM Medical Oncology and Hematology [facility] [place], [place] [address] Tel. [phone]    Fax. [phone]

## 2015-07-22 NOTE — Progress Notes (Signed)
[name] PROGRESS NOTE  Allison Mitchell IRC:[id number] DOB: 12/30/1960 DOA: 07/19/2015 PCP: [name], MD  Assessment/Plan: 1. Acute blood loss anemia. -Patient with history of stage III colon cancer, status post right hemicolectomy, presenting with ongoing GI bleed. -Initial labs revealed hemoglobin of 7.4 for which she was typed and cross and transfuse 2 units of packed red blood cells overnight. Despite this intervention a.m. lab work showing minimal improvement of her hemoglobin at 7.5 likely consistent with ongoing bleed. -Tagged reb blood cell showing no obvious source of bleed -A.m. labs reviewed, patient's hemoglobin remained stable at 9.3 although colostomy bag showing ongoing GI bleed with maroon/bloody liquid.  -Will continue to monitor CBC.  2.  GI bleed. -She has done extensive workup for GI bleed that has included upper and lower endoscopies which haven't revealed a source of bleed. -Patient requiring a total of 4 units of packed red blood cells to be transfused in the last 24 hours as she continues to have blood loss. -GI and interventional radiology consulted -She underwent prep for capsule study which should be administered today.   3.  Stage III colon cancer. -Status post right hemicolectomy with colostomy placement. -Patient undergoing adjuvant chemotherapy and radiation therapy at the [place] which is currently on hold secondary to GI bleed.  4.  Thrombocytopenia. -Likely secondary to chemotherapy, platelet count stable at 71,000.  Code Status: Full Code Family Communication: Family not present  Disposition Plan: Continue supportive care, anticipate discharge home when medically stable   Consultants: GI Interventional radiology Medical oncology  Procedures:  Red blood cell scan   HPI/Subjective: Patient is a pleasant [age] female with a history of colon cancer, status post partial right hemicolectomy, has had GI bleed for the past several  weeks, characterized by maroon-colored watery stool mixed with clots in her ostomy bag, status post upper and lower endoscopy without obvious source of bleed. She was added as a direct admit from Dr. Ernestina Penna office for further workup. Initial lab work revealed a hemoglobin of 7.4 for which she was typed and cross and transfuse 2 units packed red bloods. The following day hemoglobin showed minimal improvement at 7.5 for which she was transfuse additional blood. Interventional radiology was consulted.  On 07/22/2015. Patient complaining of muscle cramps which she attributes to Gatorade prep she took overnight. She complains of nausea though no emesis.   Objective: Filed Vitals:   07/22/15 0528  BP: 147/56  Pulse: 81  Temp: 98.2 F (36.8 C)  Resp: 16    Intake/Output Summary (Last 24 hours) at 07/22/15 1355 Last data filed at 07/22/15 1230  Gross per 24 hour  Intake      3 ml  Output    240 ml  Net   -237 ml   There were no vitals filed for this visit.  Exam:   General:  Patient is in no acute distress, she is awake and alert oriented, reports abdominal cramps  Cardiovascular: regular rate and rhythm normal S1-S2 no murmurs rubs or gallops  Respiratory: normal respiratory effort, lungs are clear to auscultation bilaterally  Abdomen: Soft, mild tenderness to palpation across generalized abdomen, colostomy back showing ongoing maroon/bloody liquid  Musculoskeletal: no edema  Data Reviewed: Basic Metabolic Panel:  Recent Labs Lab 07/19/15 0801 07/20/15 0520 07/21/15 0607  NA 142 139 139  K 3.4* 3.3* 3.6  CL  --  106 107  CO2 25 27 26   GLUCOSE 149* 106* 103*  BUN 13.2 14 11   CREATININE 1.1  1.05* 1.02*  CALCIUM 8.4 7.7* 7.8*   Liver Function Tests:  Recent Labs Lab 07/19/15 0801  AST 21  ALT 10  ALKPHOS 178*  BILITOT 0.57  PROT 5.1*  ALBUMIN 2.5*   No results for input(s): LIPASE, AMYLASE in the last 168 hours. No results for input(s): AMMONIA in the last 168  hours. CBC:  Recent Labs Lab 07/19/15 0801 07/20/15 0520 07/21/15 0139 07/21/15 0607 07/21/15 1350  WBC 7.0 5.2 6.3 5.2 5.1  NEUTROABS 5.9  --   --   --   --   HGB 7.4* 7.5* 9.3* 9.1* 9.3*  HCT 23.9* 23.3* 28.1* 27.7* 28.2*  MCV 91.2 87.9 87.8 87.9 87.9  PLT 77* 73* 66* 71* 69*   Cardiac Enzymes: No results for input(s): CKTOTAL, CKMB, CKMBINDEX, TROPONINI in the last 168 hours. BNP (last 3 results) No results for input(s): BNP in the last 8760 hours.  ProBNP (last 3 results) No results for input(s): PROBNP in the last 8760 hours.  CBG: No results for input(s): GLUCAP in the last 168 hours.  Recent Results (from the past 240 hour(s))  Culture, body fluid-bottle     Status: None   Collection Time: 07/13/15 11:56 AM  Result Value Ref Range Status   Specimen Description FLUID PLEURAL  Final   Special Requests BAA 10CCS  Final   Culture NO GROWTH 5 DAYS  Final   Report Status 07/18/2015 FINAL  Final  Gram stain     Status: None   Collection Time: 07/13/15 11:56 AM  Result Value Ref Range Status   Specimen Description PLEURAL  Final   Special Requests NONE  Final   Gram Stain   Final    ABUNDANT WBC PRESENT,BOTH PMN AND MONONUCLEAR NO ORGANISMS SEEN    Report Status 07/13/2015 FINAL  Final  TECHNOLOGIST REVIEW     Status: None   Collection Time: 07/19/15  8:01 AM  Result Value Ref Range Status   Technologist Review rare rbc fragment present  Final  MRSA PCR Screening     Status: None   Collection Time: 07/19/15  2:22 PM  Result Value Ref Range Status   MRSA by PCR NEGATIVE NEGATIVE Final    Comment:        The GeneXpert MRSA Assay (FDA approved for NASAL specimens only), is one component of a comprehensive MRSA colonization surveillance program. It is not intended to diagnose MRSA infection nor to guide or monitor treatment for MRSA infections.      Studies: Nm Gi Blood Loss  07/20/2015   CLINICAL DATA:  Two month history of GI bleeding. Stool color has  changed from black to red recently. 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  EXAM: NUCLEAR MEDICINE GASTROINTESTINAL BLEEDING SCAN  TECHNIQUE: Sequential abdominal images were obtained following intravenous administration of Tc-32m labeled red blood cells.  RADIOPHARMACEUTICALS:  20.0 mCi Tc-73m in-vitro labeled red cells.  COMPARISON:  No prior nuclear imaging. CT abdomen and pelvis 03/27/2015.  FINDINGS: No abnormal activity involving the abdomen or pelvis to suggest acute GI bleeding. Blood pool activity in the heart and vessels, as expected. Intense activity in the spleen and mild activity in the liver, as expected. Normal excretion into the urinary tract with activity in the bladder.  IMPRESSION: No evidence of active GI bleeding.   Electronically Signed   By: Evangeline Dakin M.D.   On: 07/20/2015 15:43  Scheduled Meds: . sodium chloride   Intravenous Once  . citalopram  20 mg Oral q morning - 10a  . pantoprazole (PROTONIX) IV  40 mg Intravenous Q12H  . potassium chloride SA  20 mEq Oral Daily  . sodium chloride  3 mL Intravenous Q12H  . sodium chloride  3 mL Intravenous Q12H   Continuous Infusions:   Principal Problem:   Acute blood loss anemia Active Problems:   Colorectal cancer, stage III   Hypokalemia   Symptomatic anemia    Time spent: 30 min    Kelvin Cellar  [name] Pager [phone]. If 7PM-7AM, please contact night-coverage at [email or web address], password [facility] 07/22/2015, 1:55 PM  LOS: 3 days

## 2015-07-22 NOTE — Progress Notes (Signed)
Pt given 2nd half of gatorade/ miralax mixture for bowel prep.

## 2015-07-22 NOTE — Progress Notes (Signed)
      Progress Note   Subjective  Patient tolerated bowel prep overnight. She denies abdominal pains. Swallowed capsule this afternoon. She reports ongoing bloody output in her stoma.    Objective   Vital signs in last 24 hours: Temp:  [98 F (36.7 C)-98.7 F (37.1 C)] 98 F (36.7 C) (09/18 1335) Pulse Rate:  [81-86] 86 (09/18 1335) Resp:  [16-18] 18 (09/18 1335) BP: (137-147)/(56-73) 137/65 mmHg (09/18 1335) SpO2:  [97 %-99 %] 99 % (09/18 1335) Weight:  [203 lb (92.08 kg)] 203 lb (92.08 kg) (09/18 1433) Last BM Date: 07/22/15 General:    white female in NAD Heart:  Regular rate and rhythm; no murmurs Lungs: Respirations even and unlabored, lungs CTA bilaterally Abdomen:  Soft, nontender and nondistended. Ostomy with dark blood in LLQ. Normal bowel sounds. Extremities:  Without edema. Neurologic:  Alert and oriented,  grossly normal neurologically. Psych:  Cooperative. Normal mood and affect.  Intake/Output from previous day:   Intake/Output this shift: Total I/O In: 3 [I.V.:3] Out: 240 [Stool:240]  Lab Results:  Recent Labs  07/21/15 0139 07/21/15 0607 07/21/15 1350  WBC 6.3 5.2 5.1  HGB 9.3* 9.1* 9.3*  HCT 28.1* 27.7* 28.2*  PLT 66* 71* 69*   BMET  Recent Labs  07/20/15 0520 07/21/15 0607  NA 139 139  K 3.3* 3.6  CL 106 107  CO2 27 26  GLUCOSE 106* 103*  BUN 14 11  CREATININE 1.05* 1.02*  CALCIUM 7.7* 7.8*   LFT No results for input(s): PROT, ALBUMIN, AST, ALT, ALKPHOS, BILITOT, BILIDIR, IBILI in the last 72 hours. PT/INR  Recent Labs  07/21/15 0139  LABPROT 14.1  INR 1.07    Studies/Results: No results found.     Assessment / Plan:    54 year old female with a history of multifocal colon cancer at terminal ileum / cecum, ascending colon and sigmoid colon. She is s/p chemoradiation and partial right hemicolectomy / colostomy Dec 2015. She has [id number] stage IIIB cancer.   She has been having overt GI bleeding for several weeks  without a clear etiology following EGD, upper enteroscopy, colonoscopy, and tagged RBC scan. She tolerated bowel prep overnight and capsule placed to hopefully clarify the source, which I suspect is small bowel. Hopefully it can be resulted on Monday. Further recommendations pending this result. If she has significant output in the interim, would call radiology to see if she can have another tagged RBC scan as this would be quite helpful if able to localize the source. I offered her another enteroscopy and colonoscopy if capsule is negative although would recommend another tagged RBC scan if she continues to have active bleeding.  Redway Cellar, MD [facility] Pager [phone]   Principal Problem:   Acute blood loss anemia Active Problems:   Colorectal cancer, stage III   Hypokalemia   Symptomatic anemia   Bleeding gastrointestinal     LOS: 3 days   Renelda Loma Armbruster  07/22/2015, 3:44 PM

## 2015-07-22 NOTE — Progress Notes (Signed)
[name] PROGRESS NOTE  Allison Mitchell CHE:[id number] DOB: Oct 15, 1961 DOA: 07/19/2015 PCP: [name], MD  Assessment/Plan: 1. Acute blood loss anemia. -Patient with history of stage III colon cancer, status post right hemicolectomy, presenting with ongoing GI bleed. -Initial labs revealed hemoglobin of 7.4 for which she was typed and cross and transfuse 2 units of packed red blood cells overnight. Despite this intervention a.m. lab work showing minimal improvement of her hemoglobin at 7.5 likely consistent with ongoing bleed. -Tagged reb blood cell showing no obvious source of bleed -Hg improving at 9.1 after transfusing a total of 4 units of PRBC's  2.  GI bleed. -She has done extensive workup for GI bleed that has included upper and lower endoscopies which haven't revealed a source of bleed. -Patient requiring a total of 4 units of packed red blood cells to be transfused in the last 24 hours as she continues to have blood loss. -GI interventional radiology consulted -Will continue to monitor CBC -Colostomy bag still showing maroon colored liquid.   3.  Stage III colon cancer. -Status post right hemicolectomy with colostomy placement. -Patient undergoing adjuvant chemotherapy and radiation therapy at the [facility] which is currently on hold secondary to GI bleed.  4.  Thrombocytopenia. -Likely secondary to chemotherapy, platelet count stable at 71,000.  Code Status: Full Code Family Communication: Family not present  Disposition Plan: Continue supportive care, anticipate discharge home when medically stable   Consultants: GI Interventional radiology Medical oncology  Procedures:  Red blood cell scan   HPI/Subjective: Patient is a pleasant [age] female with a history of colon cancer, status post partial right hemicolectomy, has had GI bleed for the past several weeks, characterized by maroon-colored watery stool mixed with clots in her ostomy bag, status post  upper and lower endoscopy without obvious source of bleed. She was added as a direct admit from Dr. Ernestina Penna office for further workup. Initial lab work revealed a hemoglobin of 7.4 for which she was typed and cross and transfuse 2 units packed red bloods. The following day hemoglobin showed minimal improvement at 7.5 for which she was transfuse additional blood. Interventional radiology was consulted.   Objective: Filed Vitals:   07/22/15 0528  BP: 147/56  Pulse: 81  Temp: 98.2 F (36.8 C)  Resp: 16    Intake/Output Summary (Last 24 hours) at 07/22/15 1351 Last data filed at 07/22/15 1230  Gross per 24 hour  Intake      3 ml  Output    240 ml  Net   -237 ml   There were no vitals filed for this visit.  Exam:   General:  Patient is in no acute distress, she is awake and alert oriented  Cardiovascular: regular rate and rhythm normal S1-S2 no murmurs rubs or gallops  Respiratory: normal respiratory effort, lungs are clear to auscultation bilaterally  Abdomen: soft nontender nondistended, colostomy bag showing maroon colored liquid with clots  Musculoskeletal: no edema  Data Reviewed: Basic Metabolic Panel:  Recent Labs Lab 07/19/15 0801 07/20/15 0520 07/21/15 0607  NA 142 139 139  K 3.4* 3.3* 3.6  CL  --  106 107  CO2 25 27 26   GLUCOSE 149* 106* 103*  BUN 13.2 14 11   CREATININE 1.1 1.05* 1.02*  CALCIUM 8.4 7.7* 7.8*   Liver Function Tests:  Recent Labs Lab 07/19/15 0801  AST 21  ALT 10  ALKPHOS 178*  BILITOT 0.57  PROT 5.1*  ALBUMIN 2.5*   No results for input(s):  LIPASE, AMYLASE in the last 168 hours. No results for input(s): AMMONIA in the last 168 hours. CBC:  Recent Labs Lab 07/19/15 0801 07/20/15 0520 07/21/15 0139 07/21/15 0607 07/21/15 1350  WBC 7.0 5.2 6.3 5.2 5.1  NEUTROABS 5.9  --   --   --   --   HGB 7.4* 7.5* 9.3* 9.1* 9.3*  HCT 23.9* 23.3* 28.1* 27.7* 28.2*  MCV 91.2 87.9 87.8 87.9 87.9  PLT 77* 73* 66* 71* 69*   Cardiac  Enzymes: No results for input(s): CKTOTAL, CKMB, CKMBINDEX, TROPONINI in the last 168 hours. BNP (last 3 results) No results for input(s): BNP in the last 8760 hours.  ProBNP (last 3 results) No results for input(s): PROBNP in the last 8760 hours.  CBG: No results for input(s): GLUCAP in the last 168 hours.  Recent Results (from the past 240 hour(s))  Culture, body fluid-bottle     Status: None   Collection Time: 07/13/15 11:56 AM  Result Value Ref Range Status   Specimen Description FLUID PLEURAL  Final   Special Requests BAA 10CCS  Final   Culture NO GROWTH 5 DAYS  Final   Report Status 07/18/2015 FINAL  Final  Gram stain     Status: None   Collection Time: 07/13/15 11:56 AM  Result Value Ref Range Status   Specimen Description PLEURAL  Final   Special Requests NONE  Final   Gram Stain   Final    ABUNDANT WBC PRESENT,BOTH PMN AND MONONUCLEAR NO ORGANISMS SEEN    Report Status 07/13/2015 FINAL  Final  TECHNOLOGIST REVIEW     Status: None   Collection Time: 07/19/15  8:01 AM  Result Value Ref Range Status   Technologist Review rare rbc fragment present  Final  MRSA PCR Screening     Status: None   Collection Time: 07/19/15  2:22 PM  Result Value Ref Range Status   MRSA by PCR NEGATIVE NEGATIVE Final    Comment:        The GeneXpert MRSA Assay (FDA approved for NASAL specimens only), is one component of a comprehensive MRSA colonization surveillance program. It is not intended to diagnose MRSA infection nor to guide or monitor treatment for MRSA infections.      Studies: Nm Gi Blood Loss  07/20/2015   CLINICAL DATA:  Two month history of GI bleeding. Stool color has changed from black to red recently. 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  EXAM: NUCLEAR MEDICINE GASTROINTESTINAL BLEEDING SCAN  TECHNIQUE: Sequential abdominal images  were obtained following intravenous administration of Tc-9m labeled red blood cells.  RADIOPHARMACEUTICALS:  20.0 mCi Tc-67m in-vitro labeled red cells.  COMPARISON:  No prior nuclear imaging. CT abdomen and pelvis 03/27/2015.  FINDINGS: No abnormal activity involving the abdomen or pelvis to suggest acute GI bleeding. Blood pool activity in the heart and vessels, as expected. Intense activity in the spleen and mild activity in the liver, as expected. Normal excretion into the urinary tract with activity in the bladder.  IMPRESSION: No evidence of active GI bleeding.   Electronically Signed   By: Evangeline Dakin M.D.   On: 07/20/2015 15:43    Scheduled Meds: . sodium chloride   Intravenous Once  . citalopram  20 mg Oral q morning - 10a  . pantoprazole (PROTONIX) IV  40 mg Intravenous Q12H  . potassium chloride SA  20 mEq Oral Daily  .  sodium chloride  3 mL Intravenous Q12H  . sodium chloride  3 mL Intravenous Q12H   Continuous Infusions:   Principal Problem:   Acute blood loss anemia Active Problems:   Colorectal cancer, stage III   Hypokalemia   Symptomatic anemia    Time spent: 30 min    Kelvin Cellar  [name] Pager [phone]. If 7PM-7AM, please contact night-coverage at [email or web address], password [facility] 07/22/2015, 1:51 PM  LOS: 3 days

## 2015-07-23 ENCOUNTER — Encounter (HOSPITAL_COMMUNITY): Payer: Self-pay | Admitting: Gastroenterology

## 2015-07-23 DIAGNOSIS — D6481 Anemia due to antineoplastic chemotherapy: Secondary | ICD-10-CM

## 2015-07-23 DIAGNOSIS — D696 Thrombocytopenia, unspecified: Secondary | ICD-10-CM

## 2015-07-23 LAB — TYPE AND SCREEN
ABO/RH(D): A POS
Antibody Screen: NEGATIVE
UNIT DIVISION: 0
UNIT DIVISION: 0
UNIT DIVISION: 0
UNIT DIVISION: 0
Unit division: 0
Unit division: 0
Unit division: 0

## 2015-07-23 LAB — CBC WITH DIFFERENTIAL/PLATELET
BASOS ABS: 0 10*3/uL (ref 0.0–0.1)
BASOS PCT: 0 %
Eosinophils Absolute: 0 10*3/uL (ref 0.0–0.7)
Eosinophils Relative: 1 %
HEMATOCRIT: 23.9 % — AB (ref 36.0–46.0)
HEMOGLOBIN: 7.8 g/dL — AB (ref 12.0–15.0)
Lymphocytes Relative: 6 %
Lymphs Abs: 0.2 10*3/uL — ABNORMAL LOW (ref 0.7–4.0)
MCH: 29.1 pg (ref 26.0–34.0)
MCHC: 32.6 g/dL (ref 30.0–36.0)
MCV: 89.2 fL (ref 78.0–100.0)
Monocytes Absolute: 0.5 10*3/uL (ref 0.1–1.0)
Monocytes Relative: 13 %
NEUTROS ABS: 3.1 10*3/uL (ref 1.7–7.7)
NEUTROS PCT: 80 %
Platelets: 78 10*3/uL — ABNORMAL LOW (ref 150–400)
RBC: 2.68 MIL/uL — ABNORMAL LOW (ref 3.87–5.11)
RDW: 17.3 % — ABNORMAL HIGH (ref 11.5–15.5)
WBC: 3.9 10*3/uL — AB (ref 4.0–10.5)

## 2015-07-23 MED ORDER — MORPHINE SULFATE (PF) 2 MG/ML IV SOLN
2.0000 mg | INTRAVENOUS | Status: DC | PRN
  Administered 2015-07-23: 2 mg via INTRAVENOUS
  Filled 2015-07-23: qty 1

## 2015-07-23 NOTE — Progress Notes (Signed)
Patient ID: Allison Mitchell, female   DOB: 11-12-1960, 54 y.o.   MRN: [id number]    Progress Note   Subjective  Feels fine-says bleeding has slowed way down, has not emptied ostomy since last evening.  Has has some mild cramping in lower abdomen with bleeding. Hgb 7.8 this am  Capsule completed about 3 am-has not passed capsule as yet   Objective   Vital signs in last 24 hours: Temp:  [98 F (36.7 C)-98.8 F (37.1 C)] 98.3 F (36.8 C) (09/19 0551) Pulse Rate:  [71-86] 71 (09/19 0551) Resp:  [18] 18 (09/19 0551) BP: (137-142)/(65-66) 142/66 mmHg (09/18 2040) SpO2:  [97 %-99 %] 97 % (09/19 0551) Weight:  [203 lb (92.08 kg)] 203 lb (92.08 kg) (09/18 1433) Last BM Date: 07/22/15 General:    white female in NAD,pleasant Heart:  Regular rate and rhythm; no murmurs Lungs: Respirations even and unlabored, lungs CTA bilaterally Abdomen:  Soft, minimally tenderRMQ and nondistended. Normal bowel sounds.ostomy LLQ with dark burgundy liquid stool Extremities:  Without edema. Neurologic:  Alert and oriented,  grossly normal neurologically. Psych:  Cooperative. Normal mood and affect.  Intake/Output from previous day: 09/18 0701 - 09/19 0700 In: 363 [P.O.:360; I.V.:3] Out: 240 [Stool:240] Intake/Output this shift:    Lab Results:  Recent Labs  07/21/15 1350 07/22/15 1530 07/23/15 0453  WBC 5.1 4.0 3.9*  HGB 9.3* 8.5* 7.8*  HCT 28.2* 26.1* 23.9*  PLT 69* 85* 78*   BMET  Recent Labs  07/21/15 0607  NA 139  K 3.6  CL 107  CO2 26  GLUCOSE 103*  BUN 11  CREATININE 1.02*  CALCIUM 7.8*   LFT No results for input(s): PROT, ALBUMIN, AST, ALT, ALKPHOS, BILITOT, BILIDIR, IBILI in the last 72 hours. PT/INR  Recent Labs  07/21/15 0139  LABPROT 14.1  INR 1.07        Assessment / Plan:   #1  54 yo female with acute GI bleed of unclear etiology. EGD,enteroscopy and Colonoscopy this admit all unrevealing Capsule endoscopy completed -will read today She is stable and  though still oozing bleeding has slowed. Continue clear liquids for now Transfuse to keep hgb around 8  Further recommendations  pending capsule results  Principal Problem:   Acute blood loss anemia Active Problems:   Colorectal cancer, stage III   Hypokalemia   Symptomatic anemia   Bleeding gastrointestinal     LOS: 4 days   Rayen Dafoe  07/23/2015, 9:13 AM

## 2015-07-23 NOTE — Progress Notes (Signed)
[name] PROGRESS NOTE  Allison Mitchell EXB:[id number] DOB: 1961/07/24 DOA: 07/19/2015 PCP: [name], MD  HPI/Subjective: Allison Mitchell is a 54 y.o white female who presents to the unit following several weeks of maroon-colored watery stool in her ostomy bag. Recent endoscopy (upper and lower) per Dr. Burr Medico unable to reveal source.Patient admitted for further workup of GI bleed. Initally laboratory revealed a hemoglobin of 7.4, which required a transfusion. Throughout her hospital course thus far, multiple transfusions have been required due to decreasing hemoglobin. RBC scan unable to find etiology. Capsule study results pending. PMH includes stage III colon cancer s/p right hemicolectomy.   Today the patient appears to be doing well. She reports that she had no complication with the capsule study, but has yet to pass it (ended around 3:00 AM). She states that the bleeding into her ostomy bag is significantly decreased, despite continued decrease in hemoglobin.  Denies dizziness or increased fatigue. Denies hematemesis, hematochezia, or melana. Denies any abdominal pain, but reports mild cramping in the suprapubic area (she associates this with her menstrual cycle). Her concern today is that she has not had a bowel movement in "a few weeks".   Assessment/Plan: 1. Anemia: Patient presents with ongoing GI bleed. Etiology still unknown at this time. CBC shows continued anemia (H/H: 7.8/23.9, a decrease from 2.95/26.1 yesterday). Patient asymptomatic and reports chronic anemia since childhood. Patient reports that ostomy bag bleeding has significantly slowed down. She complains that the GI bleeding is exacerbated with each transfusion. Because she is asymptomatic of acute blood loss and reports a decrease in bleeding, we will repeat CBC this afternoon and hold transfusion at this time. Consider transfusion if CBC continues to show decreased hgb. Monitor appropriately.  2. GI Bleed: Etiology  still unknown at this time. Colonoscopy, EGD, and RBC scan do not indicate any acute source of bleeding. Capsule study completed early this morning. Results pending.  3. Stage III Colon Cancer: S/p right hemicolectomy with current colostomy. Managed at the cancer center. Treatment includes chemo and radiation therapy, which is currently on hold until GI bleed etiology determined.  4. Thrombocytopenia: Secondary to chemotherapy. Decreased today (78), but remains stable compared to prior lab values. Continue to monitor.  5. ? Constipation: Patient reports a decrease in bowel movements x2 weeks. Etiology unknown. Will continue to monitor ostomy bag and will look for etiology on capsule study results.   Code Status: Full Family Communication: None at bedside  Disposition Plan: Home when medically stable  DVT Prophylaxis: SCDs   Consultants:  GI   IR   Oncology   Procedures:  Red blood cell scan   Capsule study   Antibiotics:  See below  Anti-infectives    None       Objective: Filed Vitals:   07/23/15 0551  BP:   Pulse: 71  Temp: 98.3 F (36.8 C)  Resp: 18    Intake/Output Summary (Last 24 hours) at 07/23/15 0848 Last data filed at 07/22/15 1818  Gross per 24 hour  Intake    363 ml  Output    240 ml  Net    123 ml   Filed Weights   07/22/15 1433  Weight: 92.08 kg (203 lb)    Exam:   General:  Well-appearing woman, appears stated age, in no acute distress  Cardiovascular: RRR, no m/r/g. No peripheral edema noted.   Respiratory: CTA b/l. No wheeze or rhonchi.   Abdomen: + BS, soft, non-tender, non-distended. Colostomy bag in LLQ (minimal blood clotting  noted in bag).   Musculoskeletal: Age-appropriate muscular strength in all extremities.   Neuro: A&Ox3. No focal neurological deficits noted.   Data Reviewed: Basic Metabolic Panel:  Recent Labs Lab 07/19/15 0801 07/20/15 0520 07/21/15 0607  NA 142 139 139  K 3.4* 3.3* 3.6  CL  --  106 107   CO2 25 27 26   GLUCOSE 149* 106* 103*  BUN 13.2 14 11   CREATININE 1.1 1.05* 1.02*  CALCIUM 8.4 7.7* 7.8*   Liver Function Tests:  Recent Labs Lab 07/19/15 0801  AST 21  ALT 10  ALKPHOS 178*  BILITOT 0.57  PROT 5.1*  ALBUMIN 2.5*   No results for input(s): LIPASE, AMYLASE in the last 168 hours. No results for input(s): AMMONIA in the last 168 hours. CBC:  Recent Labs Lab 07/19/15 0801  07/21/15 0139 07/21/15 0607 07/21/15 1350 07/22/15 1530 07/23/15 0453  WBC 7.0  < > 6.3 5.2 5.1 4.0 3.9*  NEUTROABS 5.9  --   --   --   --   --  3.1  HGB 7.4*  < > 9.3* 9.1* 9.3* 8.5* 7.8*  HCT 23.9*  < > 28.1* 27.7* 28.2* 26.1* 23.9*  MCV 91.2  < > 87.8 87.9 87.9 88.5 89.2  PLT 77*  < > 66* 71* 69* 85* 78*  < > = values in this interval not displayed. Cardiac Enzymes: No results for input(s): CKTOTAL, CKMB, CKMBINDEX, TROPONINI in the last 168 hours. BNP (last 3 results) No results for input(s): BNP in the last 8760 hours.  ProBNP (last 3 results) No results for input(s): PROBNP in the last 8760 hours.  CBG: No results for input(s): GLUCAP in the last 168 hours.  Recent Results (from the past 240 hour(s))  Culture, body fluid-bottle     Status: None   Collection Time: 07/13/15 11:56 AM  Result Value Ref Range Status   Specimen Description FLUID PLEURAL  Final   Special Requests BAA 10CCS  Final   Culture NO GROWTH 5 DAYS  Final   Report Status 07/18/2015 FINAL  Final  Gram stain     Status: None   Collection Time: 07/13/15 11:56 AM  Result Value Ref Range Status   Specimen Description PLEURAL  Final   Special Requests NONE  Final   Gram Stain   Final    ABUNDANT WBC PRESENT,BOTH PMN AND MONONUCLEAR NO ORGANISMS SEEN    Report Status 07/13/2015 FINAL  Final  TECHNOLOGIST REVIEW     Status: None   Collection Time: 07/19/15  8:01 AM  Result Value Ref Range Status   Technologist Review rare rbc fragment present  Final  MRSA PCR Screening     Status: None   Collection  Time: 07/19/15  2:22 PM  Result Value Ref Range Status   MRSA by PCR NEGATIVE NEGATIVE Final    Comment:        The GeneXpert MRSA Assay (FDA approved for NASAL specimens only), is one component of a comprehensive MRSA colonization surveillance program. It is not intended to diagnose MRSA infection nor to guide or monitor treatment for MRSA infections.      Studies: No results found.  Scheduled Meds: . sodium chloride   Intravenous Once  . citalopram  20 mg Oral q morning - 10a  . pantoprazole (PROTONIX) IV  40 mg Intravenous Q12H  . potassium chloride SA  20 mEq Oral Daily  . sodium chloride  3 mL Intravenous Q12H  . sodium chloride  3 mL Intravenous  Q12H   Continuous Infusions:   Principal Problem:   Acute blood loss anemia Active Problems:   Colorectal cancer, stage III   Hypokalemia   Symptomatic anemia   Bleeding gastrointestinal    Time spent: 25 minutes    Allison Mitchell, Allison Mitchell  [name] If 7PM-7AM, please contact night-coverage at [email or web address], password [facility] 07/23/2015, 8:48 AM  LOS: 4 days     Addendum I personally evaluated patient on 07/23/2015 and agree with the above findings. Patient reporting GI bleed slowing down with decreased amount of blood in her colostomy bag. Labs showing a downward trend in hemoglobin from 8.5-7.8. She is awake and alert, mentating well, physical exam unchanged. Has some lower abdominal cramping. Colostomy bag revealing small amount of bloody fluid. Per GI the capsule endoscopy has been completed and will be read later on today. Rest of her lab work stable, platelet count of 78,000 this morning. Will follow-up on capsule endoscopy read.

## 2015-07-23 NOTE — Progress Notes (Signed)
Allison Mitchell   DOB:Apr 03, 1961   KW#:[id number]   JME#:[id number]  Subjective: Kim underwent capsule endoscopy yesterday, has not pass it yet. She has intermittent worsening GI bleeding since her admission. She states "sometime blood gushing out of the stoma bag". She changed once since last night. She has some moderate gastric pain this afternoon and required pain meds.    Objective:  Filed Vitals:   07/23/15 2134  BP: 139/61  Pulse: 86  Temp: 98.7 F (37.1 C)  Resp: 16    Body mass index is 30.87 kg/(m^2).  Intake/Output Summary (Last 24 hours) at 07/23/15 2342 Last data filed at 07/23/15 1900  Gross per 24 hour  Intake    720 ml  Output      0 ml  Net    720 ml     Sclerae unicteric  Oropharynx clear  No peripheral adenopathy  Lungs clear -- no rales or rhonchi  Heart regular rate and rhythm  Abdomen soft, (+) dark red liquid in colostomy bag  MSK no focal spinal tenderness, no peripheral edema  Neuro nonfocal    CBG (last 3)  No results for input(s): GLUCAP in the last 72 hours.   Labs:  Lab Results  Component Value Date   WBC 3.9* 07/23/2015   HGB 7.8* 07/23/2015   HCT 23.9* 07/23/2015   MCV 89.2 07/23/2015   PLT 78* 07/23/2015   NEUTROABS 3.1 07/23/2015    @LASTCHEMISTRY @  Urine Studies No results for input(s): UHGB, CRYS in the last 72 hours.  Invalid input(s): UACOL, UAPR, USPG, UPH, UTP, UGL, UKET, UBIL, UNIT, UROB, ULEU, UEPI, UWBC, URBC, UBAC, CAST, [place], [place]  Basic Metabolic Panel:  Recent Labs Lab 07/19/15 0801 07/20/15 0520 07/21/15 0607  NA 142 139 139  K 3.4* 3.3* 3.6  CL  --  106 107  CO2 25 27 26   GLUCOSE 149* 106* 103*  BUN 13.2 14 11   CREATININE 1.1 1.05* 1.02*  CALCIUM 8.4 7.7* 7.8*   GFR Estimated Creatinine Clearance: 74.9 mL/min (by C-G formula based on Cr of 1.02). Liver Function Tests:  Recent Labs Lab 07/19/15 0801  AST 21  ALT 10  ALKPHOS 178*  BILITOT 0.57  PROT 5.1*  ALBUMIN 2.5*   No results for  input(s): LIPASE, AMYLASE in the last 168 hours. No results for input(s): AMMONIA in the last 168 hours. Coagulation profile  Recent Labs Lab 07/19/15 0801 07/21/15 0139  INR 1.00* 1.07  PROTIME 12.0  --     CBC:  Recent Labs Lab 07/19/15 0801  07/21/15 0139 07/21/15 0607 07/21/15 1350 07/22/15 1530 07/23/15 0453  WBC 7.0  < > 6.3 5.2 5.1 4.0 3.9*  NEUTROABS 5.9  --   --   --   --   --  3.1  HGB 7.4*  < > 9.3* 9.1* 9.3* 8.5* 7.8*  HCT 23.9*  < > 28.1* 27.7* 28.2* 26.1* 23.9*  MCV 91.2  < > 87.8 87.9 87.9 88.5 89.2  PLT 77*  < > 66* 71* 69* 85* 78*  < > = values in this interval not displayed. Cardiac Enzymes: No results for input(s): CKTOTAL, CKMB, CKMBINDEX, TROPONINI in the last 168 hours. BNP: Invalid input(s): POCBNP CBG: No results for input(s): GLUCAP in the last 168 hours. D-Dimer No results for input(s): DDIMER in the last 72 hours. Hgb A1c No results for input(s): HGBA1C in the last 72 hours. Lipid Profile No results for input(s): CHOL, HDL, LDLCALC, TRIG, CHOLHDL, LDLDIRECT in the last 72  hours. Thyroid function studies No results for input(s): TSH, T4TOTAL, T3FREE, THYROIDAB in the last 72 hours.  Invalid input(s): FREET3 Anemia work up  Recent Labs  07/21/15 0139  RETICCTPCT 4.0*   Microbiology Recent Results (from the past 240 hour(s))  TECHNOLOGIST REVIEW     Status: None   Collection Time: 07/19/15  8:01 AM  Result Value Ref Range Status   Technologist Review rare rbc fragment present  Final  MRSA PCR Screening     Status: None   Collection Time: 07/19/15  2:22 PM  Result Value Ref Range Status   MRSA by PCR NEGATIVE NEGATIVE Final    Comment:        The GeneXpert MRSA Assay (FDA approved for NASAL specimens only), is one component of a comprehensive MRSA colonization surveillance program. It is not intended to diagnose MRSA infection nor to guide or monitor treatment for MRSA infections.       Studies:  No results  found.  Assessment: 54 y.o.   1. GI bleeding, unclear source, RBC scan negative, undergoing capsule endoscopy now  2. Anemia, secondary to GI bleeding, chemo, no evidence of hemolysis  3. Metastatic colon cancer  4 right side pleural effusion, s/p thoracentesis 9/5, cytology (-) 5. Thrombocytopenia, secondary to chemotherapy, consumption from bleeding, stable  6. History of PE, off coumadin    Plan: -If her repeated hB<8 in the morning, please consider blood transfusion 2u RBC  -although her moderate thrombocytopenia is unlikely the main reason for her GI bleeding, she still has significant GI bleeding, I would consider 1u plt transfusion if plt <100 tomorrow  -awaiting for capsule endoscopy result -I have informed general surgery team   I will follow up.     Truitt Merle, MD 07/23/2015  11:42 PM

## 2015-07-24 ENCOUNTER — Inpatient Hospital Stay (HOSPITAL_COMMUNITY): Payer: BLUE CROSS/BLUE SHIELD

## 2015-07-24 ENCOUNTER — Encounter: Payer: Self-pay | Admitting: Internal Medicine

## 2015-07-24 DIAGNOSIS — R195 Other fecal abnormalities: Secondary | ICD-10-CM

## 2015-07-24 LAB — CBC
HCT: 23.5 % — ABNORMAL LOW (ref 36.0–46.0)
HEMOGLOBIN: 7.8 g/dL — AB (ref 12.0–15.0)
MCH: 29.2 pg (ref 26.0–34.0)
MCHC: 33.2 g/dL (ref 30.0–36.0)
MCV: 88 fL (ref 78.0–100.0)
PLATELETS: 101 10*3/uL — AB (ref 150–400)
RBC: 2.67 MIL/uL — AB (ref 3.87–5.11)
RDW: 16.8 % — ABNORMAL HIGH (ref 11.5–15.5)
WBC: 3.7 10*3/uL — AB (ref 4.0–10.5)

## 2015-07-24 MED ORDER — SODIUM PERTECHNETATE TC 99M INJECTION
10.0000 | Freq: Once | INTRAVENOUS | Status: AC | PRN
  Administered 2015-07-24: 9.98 via INTRAVENOUS

## 2015-07-24 NOTE — Progress Notes (Signed)
Addendum addendumTRIAD HOSPI personally evaluated patient on 07/24/2015 I personallyITALISTS PROGRESS NOTE  Allison Mitchell MRNand agree with the above findings:[id number] DOB: 08/13/1961 DOA: 07/19/2015 PCP: [name], MD  HPI/Subjective: Allison Mitchell is a 54 y.o white female who presents to the unit following several weeks of maroon-colored watery stool in her ostomy bag. Recent endoscopy (upper and lower) per Dr. Burr Medico unable to reveal source.Patient admitted for further workup of GI bleed. Initally laboratory revealed a hemoglobin of 7.4, which required a transfusion. Throughout her hospital course thus far, multiple transfusions have been required due to decreasing hemoglobin. RBC scan, capsule study, and Meckel's unable to find etiology. Capsule study unremarkable. PMH includes stage III colon cancer s/p right hemicolectomy.   Today the patient appears to be doing well. She report that she had one episode of "gushing" blood into her ostomy bag that required emptying last night. This morning she does not report any episodes of bleeding and shows slight bloody discharge in her ostomy bag. Still has not passed capsule from study. She continues to deny dizziness, fatigue, hematochezia, melana, or hematemesis. Denies fever, chills, N/V/D, or abdominal pain. Suprapubic pain from yesterday has resolved. Does not report BM as of this AM.   Assessment/Plan: 1. Anemia: Patient presents with ongoing GI bleed. Etiology still unknown at this time. CBC shows H/H fairly stable today (7.8/23.5 compared to 7.8/23.9 yesterday). Patient asymptomatic and reports chronic anemia since childhood. Reports 1 episode of bleeding into ostomy that required drainage in PM, but no bleeding today. Patient did not receive blood transfusion yesterday. Because she continues to be asymptomatic of acute blood loss a with slowed bleeding today, we will repeat CBC this afternoon and hold transfusion at this time. Consider transfusion  if CBC continues to show decreased hgb. Monitor appropriately.  2. GI Bleed: Etiology still unknown at this time. Colonoscopy, EGD, and RBC scan do not indicate any acute source of bleeding. Capsule study showed small bowel edema and non-specific erythema, but no ulceration or bleeding source. Per GI recommendations, Meckel study completed this AM and was negative with no abnormal GI uptake. Due to no passage of capsule, abdominal X-ray was obtained. Capsule located in LLQ with no evidence of bowel obstruction or ileus. As of yesterday, GI recommends possible repeat capsule to visualize distal small bowel or provocative angiography (last resort- unsure if IR would be comfortable with this). Will await further instructions from GI on how to proceed.   3. Stage III Colon Cancer: S/p right hemicolectomy with current colostomy. Managed at the cancer center. Treatment includes chemo and radiation therapy, which is currently on hold until GI bleed etiology determined.  4. Thrombocytopenia: Secondary to chemotherapy. Today platelets are continuing to rise (101). Plan is to continue to monitor. Per oncology recommendations, we will consider 1 unit platelet transfusion if < 100.  5. ? Constipation: Patient reports a decrease in bowel movements x2 weeks. Etiology unknown. No BM today. Will continue to monitor. Capsule study and X-ray show no bowel obstruction or ileus. Plan is to continue to monitor.   Code Status: Full Family Communication: None at bedside  Disposition Plan: Home when medically stable  DVT Prophylaxis: SCDs   Consultants:  GI    IR  Oncology   Procedures:  Red blood cell scan   Capsule study   Meckel's scan   Antibiotics:  See below  Anti-infectives    None        Objective: Filed Vitals:   07/24/15 0632  BP: 122/58  Pulse: 79  Temp: 98.3 F (36.8 C)  Resp: 16    Intake/Output Summary (Last 24 hours) at 07/24/15 1006 Last data filed at 07/23/15 1900  Gross  per 24 hour  Intake    480 ml  Output      0 ml  Net    480 ml   Filed Weights   07/22/15 1433  Weight: 92.08 kg (203 lb)    Exam:  6. General: Well-appearing woman, appears stated age, sitting up in bed eating breakfast in no acute distress 7. Cardiovascular: RRR, no m/r/g. No peripheral edema noted.  8. Respiratory: CTA b/l. No wheeze or rhonchi.  9. Abdomen: + BS, soft, non-tender, non-distended. Colostomy bag in LLQ (minimal blood clotting noted in bag).  10. Musculoskeletal: Age-appropriate muscular strength in all extremities.  11. Neuro: A&Ox3. No focal neurological deficits noted.  Data Reviewed: Basic Metabolic Panel:  Recent Labs Lab 07/19/15 0801 07/20/15 0520 07/21/15 0607  NA 142 139 139  K 3.4* 3.3* 3.6  CL  --  106 107  CO2 25 27 26   GLUCOSE 149* 106* 103*  BUN 13.2 14 11   CREATININE 1.1 1.05* 1.02*  CALCIUM 8.4 7.7* 7.8*   Liver Function Tests:  Recent Labs Lab 07/19/15 0801  AST 21  ALT 10  ALKPHOS 178*  BILITOT 0.57  PROT 5.1*  ALBUMIN 2.5*   No results for input(s): LIPASE, AMYLASE in the last 168 hours. No results for input(s): AMMONIA in the last 168 hours. CBC:  Recent Labs Lab 07/19/15 0801  07/21/15 0139 07/21/15 0607 07/21/15 1350 07/22/15 1530 07/23/15 0453  WBC 7.0  < > 6.3 5.2 5.1 4.0 3.9*  NEUTROABS 5.9  --   --   --   --   --  3.1  HGB 7.4*  < > 9.3* 9.1* 9.3* 8.5* 7.8*  HCT 23.9*  < > 28.1* 27.7* 28.2* 26.1* 23.9*  MCV 91.2  < > 87.8 87.9 87.9 88.5 89.2  PLT 77*  < > 66* 71* 69* 85* 78*  < > = values in this interval not displayed. Cardiac Enzymes: No results for input(s): CKTOTAL, CKMB, CKMBINDEX, TROPONINI in the last 168 hours. BNP (last 3 results) No results for input(s): BNP in the last 8760 hours.  ProBNP (last 3 results) No results for input(s): PROBNP in the last 8760 hours.  CBG: No results for input(s): GLUCAP in the last 168 hours.  Recent Results (from the past 240 hour(s))  TECHNOLOGIST  REVIEW     Status: None   Collection Time: 07/19/15  8:01 AM  Result Value Ref Range Status   Technologist Review rare rbc fragment present  Final  MRSA PCR Screening     Status: None   Collection Time: 07/19/15  2:22 PM  Result Value Ref Range Status   MRSA by PCR NEGATIVE NEGATIVE Final    Comment:        The GeneXpert MRSA Assay (FDA approved for NASAL specimens only), is one component of a comprehensive MRSA colonization surveillance program. It is not intended to diagnose MRSA infection nor to guide or monitor treatment for MRSA infections.      Studies: Nm Bowel Img Meckels  07/24/2015   CLINICAL DATA:  54 year old with 2 month history of GI bleeding. History of colon cancer and colostomy.  EXAM: NUCLEAR MEDICINE MECKEL'S SCAN  TECHNIQUE: Sequential abdominal images were obtained following intravenous injection of radiopharmaceutical.  RADIOPHARMACEUTICALS:  9.98 mCi technetium 40m pertechnetate intravenously.  COMPARISON:  Nuclear medicine  bleeding study 07/20/2015 and abdominal CT 03/27/2015.  FINDINGS: No abnormal activity is identified within the gastrointestinal tract. There is normal activity within the stomach and bladder.  IMPRESSION: Negative Meckel's scan.  There is no abnormal GI uptake.   Electronically Signed   By: Richardean Sale M.D.   On: 07/24/2015 09:16    Scheduled Meds: . sodium chloride   Intravenous Once  . citalopram  20 mg Oral q morning - 10a  . pantoprazole (PROTONIX) IV  40 mg Intravenous Q12H  . potassium chloride SA  20 mEq Oral Daily  . sodium chloride  3 mL Intravenous Q12H  . sodium chloride  3 mL Intravenous Q12H   Continuous Infusions:   Principal Problem:   Acute blood loss anemia Active Problems:   Colorectal cancer, stage III   Hypokalemia   Symptomatic anemia   Bleeding gastrointestinal    Time spent: 20 minutes    Allison Mitchell, Student-PA  [name] If 7PM-7AM, please contact night-coverage at [email or web address],  password [facility] 07/24/2015, 10:06 AM  LOS: 5 days      Addendum  I personally evaluated patient on 07/24/2015 and agree with the above findings. Allison Mitchell is a pleasant 54 year old female with a past medical history of colon cancer status post partial right hemicolectomy with removal of terminal ileum, sigmoid colectomy, undergoing chemoradiation therapy, admitted to the medicine service on 07/20/2015 with complaints of GI bleed. With a past month she had noticed maroon colored watery stool output in her colostomy bag. She underwent an extensive outpatient workup that included endoscopy that did not show obvious source of bleed. During this hospitalization she was transfused with a total of 4 units of packed red blood cells. Medical oncology, GI, interventional radiology consulted. She had a capsule endoscopy that was unremarkable so far. Bleeding scan was also unremarkable. Despite these negative studies she continues to have bloody output in ostomy bag. A KUB showed capsule possibly in:. GI recommending if not passed by tomorrow to repeat a CT scan. KUB did not reveal evidence of obstruction. On exam she is in good spirits, awake and alert, communicative, had mild tenderness to palpation over lower abdominal region. Colostomy bag showed a small amount of blood. Labs reviewed, hemoglobin 7.8, unchanged from yesterday. Follow-up on a.m. labs.

## 2015-07-24 NOTE — Progress Notes (Signed)
Patient ID: Allison Mitchell, female   DOB: [birth year], 54 y.o.   MRN: [id number]    Progress Note   Subjective   Tired, hungry, has not passed capsule , no significant bleeding past 24 hours-hgb stable at 7.8 Has had mild cramping mid abdomen, no further vomiting since one episode yesterday  Meckels scan - negative    Objective   Vital signs in last 24 hours: Temp:  [98.3 F (36.8 C)-98.7 F (37.1 C)] 98.3 F (36.8 C) (09/20 3354) Pulse Rate:  [79-86] 79 (09/20 0632) Resp:  [16-18] 16 (09/20 5625) BP: (122-139)/(58-71) 122/58 mmHg (09/20 6389) SpO2:  [93 %-100 %] 100 % (09/20 3734) Last BM Date: 07/23/15 General:  Pale   white female in NAD, in good spirits Heart:  Regular rate and rhythm; no murmurs Lungs: Respirations even and unlabored, lungs CTA bilaterally Abdomen:  Soft, tender  Mid abdomen,and nondistended. Normal bowel sounds, small amount of old marron liquid stool in ostomy Extremities:  Without edema. Neurologic:  Alert and oriented,  grossly normal neurologically. Psych:  Cooperative. Normal mood and affect.  Intake/Output from previous day: 09/19 0701 - 09/20 0700 In: 720 [P.O.:720] Out: -  Intake/Output this shift: Total I/O In: 240 [P.O.:240] Out: -   Lab Results:  Recent Labs  07/22/15 1530 07/23/15 0453 07/24/15 0938  WBC 4.0 3.9* 3.7*  HGB 8.5* 7.8* 7.8*  HCT 26.1* 23.9* 23.5*  PLT 85* 78* 101*   BMET No results for input(s): NA, K, CL, CO2, GLUCOSE, BUN, CREATININE, CALCIUM in the last 72 hours. LFT No results for input(s): PROT, ALBUMIN, AST, ALT, ALKPHOS, BILITOT, BILIDIR, IBILI in the last 72 hours. PT/INR No results for input(s): LABPROT, INR in the last 72 hours.  Studies/Results: Nm Bowel Img Meckels  07/24/2015   CLINICAL DATA:  54 year old with 2 month history of GI bleeding. History of colon cancer and colostomy.  EXAM: NUCLEAR MEDICINE MECKEL'S SCAN  TECHNIQUE: Sequential abdominal images were obtained following intravenous  injection of radiopharmaceutical.  RADIOPHARMACEUTICALS:  9.98 mCi technetium 100m pertechnetate intravenously.  COMPARISON:  Nuclear medicine bleeding study 07/20/2015 and abdominal CT 03/27/2015.  FINDINGS: No abnormal activity is identified within the gastrointestinal tract. There is normal activity within the stomach and bladder.  IMPRESSION: Negative Meckel's scan.  There is no abnormal GI uptake.   Electronically Signed   By: Allison Mitchell M.D.   On: 07/24/2015 09:16       Assessment / Plan:    #1 54 yo female with metastatic colon cancer ,s/p resection and colostomy,undergoing chemo- with acute Gi bleeding of unclear etiology.  Capsule endoscopy yesterday incomplete-no active bleeding but capsule did not pass into colon after 12 hours raising question of distal small bowel pathology . Meckels  today negative  Will check Kub to see where capsule is and if any developing obstruction- cannot repeat capsule if she has obstruction  From first capsule- may need CT  #2 anemia, thrombocytopenia-stable- transfuse if drifts any further.  Principal Problem:   Acute blood loss anemia Active Problems:   Colorectal cancer, stage III   Hypokalemia   Symptomatic anemia   Bleeding gastrointestinal     LOS: 5 days   Allison Mitchell  07/24/2015, 10:36 AM

## 2015-07-24 NOTE — Care Management Note (Signed)
Case Management Note  Patient Details  Name: Allison Mitchell MRN: [id number] Date of Birth: 1961/04/14  Subjective/Objective:         54 yo admitted with Acute Blood Loss. Hx of Colon CA with Colostomy           Action/Plan: From home with spouse  Expected Discharge Date:   (unknown)               Expected Discharge Plan:  Home/Self Care  In-House Referral:     Discharge planning Services  CM Consult  Post Acute Care Choice:    Choice offered to:     DME Arranged:    DME Agency:     HH Arranged:    HH Agency:     Status of Service:  In process, will continue to follow  Medicare Important Message Given:    Date Medicare IM Given:    Medicare IM give by:    Date Additional Medicare IM Given:    Additional Medicare Important Message give by:     If discussed at [place] of Stay Meetings, dates discussed:    Additional Comments: Chart reviewed and CM following for DC needs. Lynnell Catalan, RN 07/24/2015, 2:39 PM

## 2015-07-25 ENCOUNTER — Inpatient Hospital Stay (HOSPITAL_COMMUNITY): Payer: BLUE CROSS/BLUE SHIELD

## 2015-07-25 ENCOUNTER — Encounter (HOSPITAL_COMMUNITY): Payer: Self-pay | Admitting: Radiology

## 2015-07-25 DIAGNOSIS — T189XXA Foreign body of alimentary tract, part unspecified, initial encounter: Secondary | ICD-10-CM

## 2015-07-25 DIAGNOSIS — C787 Secondary malignant neoplasm of liver and intrahepatic bile duct: Secondary | ICD-10-CM

## 2015-07-25 DIAGNOSIS — C19 Malignant neoplasm of rectosigmoid junction: Secondary | ICD-10-CM

## 2015-07-25 DIAGNOSIS — C189 Malignant neoplasm of colon, unspecified: Secondary | ICD-10-CM | POA: Diagnosis present

## 2015-07-25 DIAGNOSIS — N182 Chronic kidney disease, stage 2 (mild): Secondary | ICD-10-CM | POA: Diagnosis present

## 2015-07-25 LAB — BASIC METABOLIC PANEL
ANION GAP: 7 (ref 5–15)
BUN: 10 mg/dL (ref 6–20)
CALCIUM: 7.4 mg/dL — AB (ref 8.9–10.3)
CO2: 26 mmol/L (ref 22–32)
CREATININE: 1.16 mg/dL — AB (ref 0.44–1.00)
Chloride: 101 mmol/L (ref 101–111)
GFR calc non Af Amer: 52 mL/min — ABNORMAL LOW (ref >60)
Glucose, Bld: 102 mg/dL — ABNORMAL HIGH (ref 65–99)
Potassium: 3.4 mmol/L — ABNORMAL LOW (ref 3.5–5.1)
SODIUM: 134 mmol/L — AB (ref 135–145)

## 2015-07-25 LAB — CBC
HCT: 25 % — ABNORMAL LOW (ref 36.0–46.0)
HEMATOCRIT: 20.5 % — AB (ref 36.0–46.0)
HEMOGLOBIN: 6.7 g/dL — AB (ref 12.0–15.0)
HEMOGLOBIN: 8.3 g/dL — AB (ref 12.0–15.0)
MCH: 29.1 pg (ref 26.0–34.0)
MCH: 29.4 pg (ref 26.0–34.0)
MCHC: 32.7 g/dL (ref 30.0–36.0)
MCHC: 33.2 g/dL (ref 30.0–36.0)
MCV: 89.1 fL (ref 78.0–100.0)
MCV: 88.7 fL (ref 78.0–100.0)
PLATELETS: 109 10*3/uL — AB (ref 150–400)
Platelets: 94 10*3/uL — ABNORMAL LOW (ref 150–400)
RBC: 2.3 MIL/uL — ABNORMAL LOW (ref 3.87–5.11)
RBC: 2.82 MIL/uL — AB (ref 3.87–5.11)
RDW: 17 % — AB (ref 11.5–15.5)
RDW: 16.1 % — ABNORMAL HIGH (ref 11.5–15.5)
WBC: 3.9 10*3/uL — AB (ref 4.0–10.5)
WBC: 5.5 10*3/uL (ref 4.0–10.5)

## 2015-07-25 LAB — PREPARE RBC (CROSSMATCH)

## 2015-07-25 MED ORDER — IOHEXOL 300 MG/ML  SOLN
100.0000 mL | Freq: Once | INTRAMUSCULAR | Status: AC | PRN
  Administered 2015-07-25: 100 mL via INTRAVENOUS

## 2015-07-25 MED ORDER — BARIUM SULFATE 0.1 % PO SUSP
450.0000 mL | Freq: Once | ORAL | Status: AC
  Administered 2015-07-25: 450 mL via ORAL

## 2015-07-25 MED ORDER — SODIUM CHLORIDE 0.9 % IV SOLN: Freq: Once | INTRAVENOUS | Status: DC

## 2015-07-25 NOTE — Consult Note (Signed)
Reason for Consult:GI bleed, colon cancer Referring Physician: Majesty Stehlin is an 54 y.o. female.  HPI: 54 yo female with adenocarcinoma of the colon s/p en bloc resection with R colectomy, sigmoid colectomy now has been through multiple treatments of chemo and radiation, complicated by PE for which she was on coumadin until 1 month ago when she began having bleeding out her ostomy. She has undergone upper and lower endoscopy with findings of esophagitis but otherwise no culprit for this bleed. She was undergoing capsule endoscopy but the capsule has now stopped movement and there is concern for blockage. She continues to have maroon colored stools out her ostomy. She has minimal pain at this time.  She was trying to go to [facility] for evaluation for HIPEC and wants to continue aggressive treatment of this cancer as she has 2 children under her care.  Past Medical History  Diagnosis Date  . GERD (gastroesophageal reflux disease)   . Depression   . Anxiety   . 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 Peripheral neuropathy due to chemotherapy   . Hydronephrosis, right     malignant--  s/p reimiplant right ureter  . History of pulmonary embolus (PE)     11-28-2014--  due to chemotherapy on coumadin  . Anticoagulated on Coumadin   . Colorectal cancer, stage III oncologist--  dr Stefani Dama    Dx  10-10-2014---Stage IIIC, [id number], Multifocal synchronous, Moderate differentiated Invasive, Mets to nodes (5 out of 17)/  s/p  Resection tumor cecum, terminal ileum, sigmoid/  currently chemoradiation therapy  . Dysuria   . S/P radiation therapy 03/02/15    completed pelvis/abd/50.4Gy  . Allergy     SEASONAL  . Blood transfusion without reported diagnosis     Past Surgical History  Procedure Laterality Date  . Cystoscopy with retrograde pyelogram, ureteroscopy and stent placement Right 10/05/2014    Procedure: [place], URETEROSCOPY  AND  STENT PLACEMENT;  Surgeon: Alexis Frock, MD;  Location: [facility];  Service: Urology;  Laterality: Right;  . Laparotomy N/A 10/10/2014    Procedure: EXPLORATORY LAPAROTOMY;  Surgeon: Doreen Salvage, MD;  Location: [place];  Service: General;  Laterality: N/A;  . Partial colectomy  10/10/2014    Procedure: PARTIAL SIGMOID COLECTOMY;  Surgeon: Doreen Salvage, MD;  Location: [place];  Service: General;;  . Colostomy  10/10/2014    Procedure: COLOSTOMY;  Surgeon: Doreen Salvage, MD;  Location: [place];  Service: General;;  . Colostomy revision  10/10/2014    Procedure: PARTIAL RIGHT COLECTOMY;  Surgeon: Doreen Salvage, MD;  Location: [place];  Service: General;;  . Ureteral reimplantion Right 10/10/2014    Procedure: OPEN RIGHT URETERAL LYSIS ;  Surgeon: Alexis Frock, MD;  Location: [place];  Service: Urology;  Laterality: Right;  . Portacath placement N/A 10/17/2014    Procedure: INSERTION PORT-A-CATH LEFT SUBCLAVIAN AND MIDLINE INCISION STAPLE REMOVAL ;  Surgeon: Coralie Keens, MD;  Location: [place];  Service: General;  Laterality: N/A;  . Cystoscopy w/ retrogrades Right 01/10/2015    Procedure: CYSTOSCOPY WITH RETROGRADE PYELOGRAM;  Surgeon: Alexis Frock, MD;  Location: [facility];  Service: Urology;  Laterality: Right;  . Cystoscopy w/ ureteral stent placement Right 01/10/2015    Procedure: CYSTOSCOPY WITH STENT REPLACEMENT;  Surgeon: Alexis Frock, MD;  Location: [facility];  Service: Urology;  Laterality: Right;  . Colonoscopy    . Enteroscopy N/A 07/16/2015    Procedure: ENTEROSCOPY;  Surgeon: Gatha Mayer, MD;  Location: [facility];  Service: Endoscopy;  Laterality: N/A;  . [place] capsule study N/A 07/22/2015    Procedure: GIVENS CAPSULE STUDY;  Surgeon: Manus Gunning, MD;  Location: [facility];  Service: Gastroenterology;  Laterality: N/A;    Family History  Problem Relation Age of Onset  . Cancer Mother     skin cancer   . Diabetes Father   . CAD Father     Onset in  his 27s, has 7 stents  . Heart disease Father   . Cancer Maternal Grandmother 65    colon cancer   . Breast cancer Maternal Grandmother   . Colon cancer Maternal Grandmother   . Cancer Paternal Grandmother 16    breast cancer   . Breast cancer Paternal Grandmother     Social History:  reports that she has never smoked. She has never used smokeless tobacco. She reports that she does not drink alcohol or use illicit drugs.  Allergies:  Allergies  Allergen Reactions  . Amoxicillin Other (See Comments)    Yeast infection    Medications: I have reviewed the patient's current medications.  Results for orders placed or performed during the hospital encounter of 07/19/15 (from the past 48 hour(s))  CBC     Status: Abnormal   Collection Time: 07/24/15  9:38 AM  Result Value Ref Range   WBC 3.7 (L) 4.0 - 10.5 K/uL   RBC 2.67 (L) 3.87 - 5.11 MIL/uL   Hemoglobin 7.8 (L) 12.0 - 15.0 g/dL    Comment: REPEATED TO VERIFY   HCT 23.5 (L) 36.0 - 46.0 %   MCV 88.0 78.0 - 100.0 fL   MCH 29.2 26.0 - 34.0 pg   MCHC 33.2 30.0 - 36.0 g/dL   RDW 16.8 (H) 11.5 - 15.5 %   Platelets 101 (L) 150 - 400 K/uL    Comment: SPECIMEN CHECKED FOR CLOTS REPEATED TO VERIFY CONSISTENT WITH PREVIOUS RESULT   CBC     Status: Abnormal   Collection Time: 07/25/15  6:25 AM  Result Value Ref Range   WBC 3.9 (L) 4.0 - 10.5 K/uL   RBC 2.30 (L) 3.87 - 5.11 MIL/uL   Hemoglobin 6.7 (LL) 12.0 - 15.0 g/dL    Comment: REPEATED TO VERIFY CRITICAL RESULT CALLED TO, READ BACK BY AND VERIFIED WITH: M. MILLS RN AT [id number] ON 09.21.16 BY SHUEA    HCT 20.5 (L) 36.0 - 46.0 %   MCV 89.1 78.0 - 100.0 fL   MCH 29.1 26.0 - 34.0 pg   MCHC 32.7 30.0 - 36.0 g/dL   RDW 17.0 (H) 11.5 - 15.5 %   Platelets 94 (L) 150 - 400 K/uL    Comment: CONSISTENT WITH PREVIOUS RESULT  Basic metabolic panel     Status: Abnormal   Collection Time: 07/25/15  6:25 AM  Result Value Ref Range   Sodium 134 (L) 135 - 145 mmol/L   Potassium 3.4 (L) 3.5  - 5.1 mmol/L   Chloride 101 101 - 111 mmol/L   CO2 26 22 - 32 mmol/L   Glucose, Bld 102 (H) 65 - 99 mg/dL   BUN 10 6 - 20 mg/dL   Creatinine, Ser 1.16 (H) 0.44 - 1.00 mg/dL   Calcium 7.4 (L) 8.9 - 10.3 mg/dL   GFR calc non Af Amer 52 (L) >60 mL/min   GFR calc Af Amer >60 >60 mL/min    Comment: (NOTE) The eGFR has been calculated using the CKD  EPI equation. This calculation has not been validated in all clinical situations. eGFR's persistently <60 mL/min signify possible Chronic Kidney Disease.    Anion gap 7 5 - 15  Prepare RBC     Status: None   Collection Time: 07/25/15  7:00 AM  Result Value Ref Range   Order Confirmation ORDER PROCESSED BY BLOOD BANK   Type and screen     Status: None (Preliminary result)   Collection Time: 07/25/15  7:50 AM  Result Value Ref Range   ABO/RH(D) A POS    Antibody Screen NEG    Sample Expiration 07/28/2015    Unit Number [id number]    Blood Component Type RED CELLS,LR    Unit division 00    Status of Unit ALLOCATED    Transfusion Status OK TO TRANSFUSE    Crossmatch Result Compatible     Dg Abd 1 View  07/24/2015   CLINICAL DATA:  Generalized abdominal pain, ingested endoscopy capsule yesterday.  EXAM: ABDOMEN - 1 VIEW  COMPARISON:  None.  FINDINGS: The bowel gas pattern is normal. Endoscopy capsule is seen in left lower quadrant of abdomen. Stool is noted in the rectum. No abnormal calcifications are noted.  IMPRESSION: No evidence of bowel obstruction or ileus. Endoscopy capsule seen in left lower quadrant of abdomen.   Electronically Signed   By: Marijo Conception, M.D.   On: 07/24/2015 11:21   Nm Bowel Img Meckels  07/24/2015   CLINICAL DATA:  54 year old with 2 month history of GI bleeding. History of colon cancer and colostomy.  EXAM: NUCLEAR MEDICINE MECKEL'S SCAN  TECHNIQUE: Sequential abdominal images were obtained following intravenous injection of radiopharmaceutical.  RADIOPHARMACEUTICALS:  9.98 mCi technetium 57mpertechnetate  intravenously.  COMPARISON:  Nuclear medicine bleeding study 07/20/2015 and abdominal CT 03/27/2015.  FINDINGS: No abnormal activity is identified within the gastrointestinal tract. There is normal activity within the stomach and bladder.  IMPRESSION: Negative Meckel's scan.  There is no abnormal GI uptake.   Electronically Signed   By: WRichardean SaleM.D.   On: 07/24/2015 09:16    Review of Systems  Constitutional: Positive for malaise/fatigue. Negative for fever and chills.  HENT: Negative for hearing loss and tinnitus.   Eyes: Negative for photophobia and pain.  Respiratory: Negative for hemoptysis and sputum production.   Cardiovascular: Negative for palpitations and orthopnea.  Gastrointestinal: Positive for abdominal pain and blood in stool. Negative for nausea and vomiting.  Genitourinary: Negative for dysuria and urgency.  Musculoskeletal: Negative for joint pain and neck pain.  Skin: Negative for itching and rash.  Neurological: Negative for tingling and tremors.  Endo/Heme/Allergies: Negative for environmental allergies. Does not bruise/bleed easily.  Psychiatric/Behavioral: Negative for hallucinations. The patient is nervous/anxious.    Blood pressure 120/53, pulse 78, temperature 98.8 F (37.1 C), temperature source Oral, resp. rate 16, height 5' 8"  (1.727 m), weight 92.08 kg (203 lb), SpO2 97 %. Physical Exam  Constitutional: She is oriented to person, place, and time. She appears well-developed and well-nourished.  HENT:  Head: Normocephalic and atraumatic.  Eyes: Pupils are equal, round, and reactive to light.  Neck: Normal range of motion. Neck supple.  Respiratory: Effort normal and breath sounds normal.  GI: Soft. She exhibits no distension. There is no tenderness.  Musculoskeletal: She exhibits edema and tenderness.  Neurological: She is alert and oriented to person, place, and time.  Skin: Skin is warm and dry.  Psychiatric: She has a normal mood and affect. Her  behavior is  normal.    Assessment/Plan: 54 yo female with advanced adenocarcinoma with signs of carcinomatosis on last imaging now with acute GI bleed unable to identify location, endoscopy does not show recurrence at anastomosis or ostomy site. -I will discuss the case with Dr. Hulen Skains  -surgical attempt at this point will likely have multiple complications so I will be discussing options with the rest of the team.  Arta Bruce Kinsinger 07/25/2015, 10:07 AM

## 2015-07-25 NOTE — Progress Notes (Addendum)
PROGRESS NOTE  Allison Mitchell MWU:[id number] DOB: 06-Aug-1961 DOA: 07/19/2015 PCP: [name], MD   HPI: Allison Mitchell is a 54 y.o white female who presents to the unit following several weeks of maroon-colored watery stool in her ostomy bag. Recent endoscopy (upper and lower) per Dr. Burr Medico unable to reveal source. Patient admitted for further workup of GI bleed. Initally laboratory revealed a hemoglobin of 7.4, which required a transfusion. Throughout her hospital course thus far, multiple transfusions have been required due to decreasing hemoglobin. RBC scan, capsule study, and Meckel's unable to find etiology. PMH includes stage III colon cancer s/p right hemicolectomy.   Subjective / 24 H Interval events Today the patient appears to be doing ok. She states that "today has not been the best day". She reports that the bleeding into her ostomy bag has increased, but denied the previous episodes of gushing blood. Still has not passed capsule. Denies fever, chills, N/V/D, or abdominal pain. No chest pain, dizziness, or fatigue. Continues to deny hematemesis, melana, or hematochezia.    Assessment/Plan:  Acute blood loss anemia due to occult GI bleeding - ongoing, without clear source, continues to require blood transfusions. She is s/p 2U pRBC 9/15, 2U 9/16 and 2U 9/21> As far as her anemia is concerned, she remains asymptomatic - GI following, patient underwent nuclear blood loss study which was negative and and Meckel's scan which was as well negative. - recently had an EGD and a lower endoscopy this month without acute findings - GI, Oncology, General surgery following. I discussed with Dr. Burr Medico today  Stage III Colon Cancer   - S/p right hemicolectomy with current colostomy.  - Managed at the cancer center w/ chemo and radiation therapy, currently being considered for cytoreductive surgery and HIPEC - oncology following, for now chemo is on hold  CKD stage II - Cr overall stable,  continue to monitor  Chemotherapy induced pancytopenia - closely monitor platelets/WBC.   Hypokalemia - Suspected to be secondary to increased GI loss - replete as needed  Hyponatremia - monitor  Diet: Liquid diet  Fluids: NS DVT Prophylaxis: SCDs  Code Status: Full Code Family Communication: None at bedside  Disposition Plan: Home when medically stable    Consultants:  GI  IR   Oncology   Surgery   Procedures:  Red blood cell scan   Capsule study   Meckel's scan    Antibiotics  Anti-infectives    None       Studies  Dg Abd 1 View  07/24/2015   CLINICAL DATA:  Generalized abdominal pain, ingested endoscopy capsule yesterday.  EXAM: ABDOMEN - 1 VIEW  COMPARISON:  None.  FINDINGS: The bowel gas pattern is normal. Endoscopy capsule is seen in left lower quadrant of abdomen. Stool is noted in the rectum. No abnormal calcifications are noted.  IMPRESSION: No evidence of bowel obstruction or ileus. Endoscopy capsule seen in left lower quadrant of abdomen.   Electronically Signed   By: Marijo Conception, M.D.   On: 07/24/2015 11:21   Nm Bowel Img Meckels  07/24/2015   CLINICAL DATA:  54 year old with 2 month history of GI bleeding. History of colon cancer and colostomy.  EXAM: NUCLEAR MEDICINE MECKEL'S SCAN  TECHNIQUE: Sequential abdominal images were obtained following intravenous injection of radiopharmaceutical.  RADIOPHARMACEUTICALS:  9.98 mCi technetium 7m pertechnetate intravenously.  COMPARISON:  Nuclear medicine bleeding study 07/20/2015 and abdominal CT 03/27/2015.  FINDINGS: No abnormal activity is identified within the gastrointestinal tract. There is normal activity  within the stomach and bladder.  IMPRESSION: Negative Meckel's scan.  There is no abnormal GI uptake.   Electronically Signed   By: Richardean Sale M.D.   On: 07/24/2015 09:16    Objective  Filed Vitals:   07/25/15 0645 07/25/15 1133 07/25/15 1150 07/25/15 1352  BP: 120/53 120/75 132/59  137/62  Pulse: 78 80 86 90  Temp: 98.8 F (37.1 C) 98.8 F (37.1 C) 98.4 F (36.9 C) 99 F (37.2 C)  TempSrc: Oral Oral Oral Oral  Resp: 16 16 16 16   Height:      Weight:      SpO2: 97%       Intake/Output Summary (Last 24 hours) at 07/25/15 1409 Last data filed at 07/25/15 1352  Gross per 24 hour  Intake    570 ml  Output    150 ml  Net    420 ml   Filed Weights   07/22/15 1433  Weight: 92.08 kg (203 lb)    Exam:  GENERAL: Well-appearing woman, sitting up in bed in no acute distress.   HEENT: Head NCAT, no scleral icterus. Mucous membranes are moist.   NECK: Supple. No lymphadenopathy or thyromegaly.  LUNGS: Clear to auscultation. No wheezing or crackles  HEART: Regular rate and rhythm, mild systolic ejection murmur. No JVD, no peripheral edema.  ABDOMEN: Soft, nontender, and nondistended. Positive bowel sounds. Colostomy bag in LLQ (filled with air and dark, bloody contents)  EXTREMITIES: Without any cyanosis, clubbing, rash, lesions or edema.  NEUROLOGIC: Alert and oriented x3. Appropriate movement of upper and lower extremities.   PSYCHIATRIC: Appropriate mood and affect, patient does appear to be more flat today compared to previous days,   SKIN: No ulceration or induration present.  Data Reviewed: Basic Metabolic Panel:  Recent Labs Lab 07/19/15 0801 07/20/15 0520 07/21/15 0607 07/25/15 0625  NA 142 139 139 134*  K 3.4* 3.3* 3.6 3.4*  CL  --  106 107 101  CO2 25 27 26 26   GLUCOSE 149* 106* 103* 102*  BUN 13.2 14 11 10   CREATININE 1.1 1.05* 1.02* 1.16*  CALCIUM 8.4 7.7* 7.8* 7.4*   Liver Function Tests:  Recent Labs Lab 07/19/15 0801  AST 21  ALT 10  ALKPHOS 178*  BILITOT 0.57  PROT 5.1*  ALBUMIN 2.5*   CBC:  Recent Labs Lab 07/19/15 0801  07/21/15 1350 07/22/15 1530 07/23/15 0453 07/24/15 0938 07/25/15 0625  WBC 7.0  < > 5.1 4.0 3.9* 3.7* 3.9*  NEUTROABS 5.9  --   --   --  3.1  --   --   HGB 7.4*  < > 9.3* 8.5* 7.8* 7.8*  6.7*  HCT 23.9*  < > 28.2* 26.1* 23.9* 23.5* 20.5*  MCV 91.2  < > 87.9 88.5 89.2 88.0 89.1  PLT 77*  < > 69* 85* 78* 101* 94*  < > = values in this interval not displayed.   Recent Results (from the past 240 hour(s))  TECHNOLOGIST REVIEW     Status: None   Collection Time: 07/19/15  8:01 AM  Result Value Ref Range Status   Technologist Review rare rbc fragment present  Final  MRSA PCR Screening     Status: None   Collection Time: 07/19/15  2:22 PM  Result Value Ref Range Status   MRSA by PCR NEGATIVE NEGATIVE Final    Comment:        The GeneXpert MRSA Assay (FDA approved for NASAL specimens only), is one component of a  comprehensive MRSA colonization surveillance program. It is not intended to diagnose MRSA infection nor to guide or monitor treatment for MRSA infections.      Scheduled Meds: . sodium chloride   Intravenous Once  . sodium chloride   Intravenous Once  . citalopram  20 mg Oral q morning - 10a  . pantoprazole (PROTONIX) IV  40 mg Intravenous Q12H  . potassium chloride SA  20 mEq Oral Daily  . sodium chloride  3 mL Intravenous Q12H  . sodium chloride  3 mL Intravenous Q12H   Continuous Infusions:    Micayla Zeltman, Student-PA    Costin M. Cruzita Lederer, MD [name] If 7 PM - 7 AM, please contact night-coverage at [email or web address], password [email or web address] 07/25/2015, 2:09 PM  LOS: 6 days

## 2015-07-25 NOTE — Progress Notes (Signed)
Patient ID: Allison Mitchell, female   DOB: 1960-11-07, 54 y.o.   MRN: [id number]    Progress Note   Subjective  Feels ok, denies pain ,nausea or vomiting. No overt bleeding ,gushing of blood etc-has not emptied bag hgb down to 6.7,plts 94 Has not passed the capsule   Objective   Vital signs in last 24 hours: Temp:  [98.8 F (37.1 C)-99.6 F (37.6 C)] 98.8 F (37.1 C) (09/21 0645) Pulse Rate:  [78-87] 78 (09/21 0645) Resp:  [16] 16 (09/21 0645) BP: (120-138)/(53-72) 120/53 mmHg (09/21 0645) SpO2:  [97 %-100 %] 97 % (09/21 0645) Last BM Date: 07/24/15 General:    white female in NAD,pale Heart:  Regular rate and rhythm; no murmurs Lungs: Respirations even and unlabored, lungs CTA bilaterally Abdomen:  Soft, nontender and nondistended. Normal bowel sounds. Ostomy bag with minimal blackish liquid Extremities:  Without edema. Neurologic:  Alert and oriented,  grossly normal neurologically. Psych:  Cooperative. Normal mood and affect.  Intake/Output from previous day: 09/20 0701 - 09/21 0700 In: 480 [P.O.:480] Out: 150 [Stool:150] Intake/Output this shift:    Lab Results:  Recent Labs  07/23/15 0453 07/24/15 0938 07/25/15 0625  WBC 3.9* 3.7* 3.9*  HGB 7.8* 7.8* 6.7*  HCT 23.9* 23.5* 20.5*  PLT 78* 101* 94*   BMET  Recent Labs  07/25/15 0625  NA 134*  K 3.4*  CL 101  CO2 26  GLUCOSE 102*  BUN 10  CREATININE 1.16*  CALCIUM 7.4*   LFT No results for input(s): PROT, ALBUMIN, AST, ALT, ALKPHOS, BILITOT, BILIDIR, IBILI in the last 72 hours. PT/INR No results for input(s): LABPROT, INR in the last 72 hours.  Studies/Results: Dg Abd 1 View  07/24/2015   CLINICAL DATA:  Generalized abdominal pain, ingested endoscopy capsule yesterday.  EXAM: ABDOMEN - 1 VIEW  COMPARISON:  None.  FINDINGS: The bowel gas pattern is normal. Endoscopy capsule is seen in left lower quadrant of abdomen. Stool is noted in the rectum. No abnormal calcifications are noted.  IMPRESSION: No  evidence of bowel obstruction or ileus. Endoscopy capsule seen in left lower quadrant of abdomen.   Electronically Signed   By: Marijo Conception, M.D.   On: 07/24/2015 11:21   Nm Bowel Img Meckels  07/24/2015   CLINICAL DATA:  54 year old with 2 month history of GI bleeding. History of colon cancer and colostomy.  EXAM: NUCLEAR MEDICINE MECKEL'S SCAN  TECHNIQUE: Sequential abdominal images were obtained following intravenous injection of radiopharmaceutical.  RADIOPHARMACEUTICALS:  9.98 mCi technetium 22m pertechnetate intravenously.  COMPARISON:  Nuclear medicine bleeding study 07/20/2015 and abdominal CT 03/27/2015.  FINDINGS: No abnormal activity is identified within the gastrointestinal tract. There is normal activity within the stomach and bladder.  IMPRESSION: Negative Meckel's scan.  There is no abnormal GI uptake.   Electronically Signed   By: Richardean Sale M.D.   On: 07/24/2015 09:16       Assessment / Plan:    #1 54 yo female with metastatic colon cancer with carcinomatosis- undergoing chemo -with persistent Gi bleed of unclear etiology-suspect distal small bowel source.  Capsule endoscopy 72 hours ago- has not passed capsule and films show  Capsule in LLQ- HGb has drifted a gram since yesterday without over bleeding Concerned that she has distal  small bowel involvement with tumor which is also bleeding  Will proceed with Ct enterography today- discussed with radiology, and suggested as best test. May need surgery to see -Dr Hulen Skains has done her prior surgery  and she wants him to operate if she requires. Chemo on hold until above sorted out Transfuse 2 units slowly today Full liquids post enterography  Principal Problem:   Acute blood loss anemia Active Problems:   Colorectal cancer, stage III   Hypokalemia   Symptomatic anemia   Bleeding gastrointestinal   Occult GI bleeding     LOS: 6 days   [name]  07/25/2015, 9:11 AM

## 2015-07-25 NOTE — Progress Notes (Signed)
Allison Mitchell   DOB:07/26/1961   GL#:[id number]   JJO#:[id number]  Subjective: she has not passed the capsule yet, Hb dropped to 6.7 this morning, although no significant more bloody stoma output in the past few days. Otherwise feels OK. No pain.   Objective:  Filed Vitals:   07/25/15 1352  BP: 137/62  Pulse: 90  Temp: 99 F (37.2 C)  Resp: 16    Body mass index is 30.87 kg/(m^2).  Intake/Output Summary (Last 24 hours) at 07/25/15 1620 Last data filed at 07/25/15 1352  Gross per 24 hour  Intake    570 ml  Output    150 ml  Net    420 ml     Sclerae unicteric  Oropharynx clear  No peripheral adenopathy  Lungs clear -- no rales or rhonchi  Heart regular rate and rhythm  Abdomen soft, (+) dark red liquid in colostomy bag  MSK no focal spinal tenderness, no peripheral edema  Neuro nonfocal    CBG (last 3)  No results for input(s): GLUCAP in the last 72 hours.   Labs:  Lab Results  Component Value Date   WBC 3.9* 07/25/2015   HGB 6.7* 07/25/2015   HCT 20.5* 07/25/2015   MCV 89.1 07/25/2015   PLT 94* 07/25/2015   NEUTROABS 3.1 07/23/2015    @LASTCHEMISTRY @  Urine Studies No results for input(s): UHGB, CRYS in the last 72 hours.  Invalid input(s): UACOL, UAPR, USPG, UPH, UTP, UGL, UKET, UBIL, UNIT, UROB, ULEU, UEPI, UWBC, URBC, UBAC, CAST, [place], [place]  Basic Metabolic Panel:  Recent Labs Lab 07/19/15 0801  07/20/15 0520 07/21/15 0607 07/25/15 0625  NA 142  --  139 139 134*  K 3.4*  < > 3.3* 3.6 3.4*  CL  --   --  106 107 101  CO2 25  --  27 26 26   GLUCOSE 149*  --  106* 103* 102*  BUN 13.2  --  14 11 10   CREATININE 1.1  --  1.05* 1.02* 1.16*  CALCIUM 8.4  --  7.7* 7.8* 7.4*  < > = values in this interval not displayed. GFR Estimated Creatinine Clearance: 65.8 mL/min (by C-G formula based on Cr of 1.16). Liver Function Tests:  Recent Labs Lab 07/19/15 0801  AST 21  ALT 10  ALKPHOS 178*  BILITOT 0.57  PROT 5.1*  ALBUMIN 2.5*   No results for  input(s): LIPASE, AMYLASE in the last 168 hours. No results for input(s): AMMONIA in the last 168 hours. Coagulation profile  Recent Labs Lab 07/19/15 0801 07/21/15 0139  INR 1.00* 1.07  PROTIME 12.0  --     CBC:  Recent Labs Lab 07/19/15 0801  07/21/15 1350 07/22/15 1530 07/23/15 0453 07/24/15 0938 07/25/15 0625  WBC 7.0  < > 5.1 4.0 3.9* 3.7* 3.9*  NEUTROABS 5.9  --   --   --  3.1  --   --   HGB 7.4*  < > 9.3* 8.5* 7.8* 7.8* 6.7*  HCT 23.9*  < > 28.2* 26.1* 23.9* 23.5* 20.5*  MCV 91.2  < > 87.9 88.5 89.2 88.0 89.1  PLT 77*  < > 69* 85* 78* 101* 94*  < > = values in this interval not displayed. Cardiac Enzymes: No results for input(s): CKTOTAL, CKMB, CKMBINDEX, TROPONINI in the last 168 hours. BNP: Invalid input(s): POCBNP CBG: No results for input(s): GLUCAP in the last 168 hours. D-Dimer No results for input(s): DDIMER in the last 72 hours. Hgb A1c No results for  input(s): HGBA1C in the last 72 hours. Lipid Profile No results for input(s): CHOL, HDL, LDLCALC, TRIG, CHOLHDL, LDLDIRECT in the last 72 hours. Thyroid function studies No results for input(s): TSH, T4TOTAL, T3FREE, THYROIDAB in the last 72 hours.  Invalid input(s): FREET3 Anemia work up No results for input(s): VITAMINB12, FOLATE, FERRITIN, TIBC, IRON, RETICCTPCT in the last 72 hours. Microbiology Recent Results (from the past 240 hour(s))  TECHNOLOGIST REVIEW     Status: None   Collection Time: 07/19/15  8:01 AM  Result Value Ref Range Status   Technologist Review rare rbc fragment present  Final  MRSA PCR Screening     Status: None   Collection Time: 07/19/15  2:22 PM  Result Value Ref Range Status   MRSA by PCR NEGATIVE NEGATIVE Final    Comment:        The GeneXpert MRSA Assay (FDA approved for NASAL specimens only), is one component of a comprehensive MRSA colonization surveillance program. It is not intended to diagnose MRSA infection nor to guide or monitor treatment for MRSA  infections.       Studies:  Dg Abd 1 View  07/24/2015   CLINICAL DATA:  Generalized abdominal pain, ingested endoscopy capsule yesterday.  EXAM: ABDOMEN - 1 VIEW  COMPARISON:  None.  FINDINGS: The bowel gas pattern is normal. Endoscopy capsule is seen in left lower quadrant of abdomen. Stool is noted in the rectum. No abnormal calcifications are noted.  IMPRESSION: No evidence of bowel obstruction or ileus. Endoscopy capsule seen in left lower quadrant of abdomen.   Electronically Signed   By: Marijo Conception, M.D.   On: 07/24/2015 11:21   Nm Bowel Img Meckels  07/24/2015   CLINICAL DATA:  54 year old with 2 month history of GI bleeding. History of colon cancer and colostomy.  EXAM: NUCLEAR MEDICINE MECKEL'S SCAN  TECHNIQUE: Sequential abdominal images were obtained following intravenous injection of radiopharmaceutical.  RADIOPHARMACEUTICALS:  9.98 mCi technetium 14m pertechnetate intravenously.  COMPARISON:  Nuclear medicine bleeding study 07/20/2015 and abdominal CT 03/27/2015.  FINDINGS: No abnormal activity is identified within the gastrointestinal tract. There is normal activity within the stomach and bladder.  IMPRESSION: Negative Meckel's scan.  There is no abnormal GI uptake.   Electronically Signed   By: Richardean Sale M.D.   On: 07/24/2015 09:16    Assessment: 54 y.o.   1. GI bleeding, unclear source, RBC scan negative, undergoing capsule endoscopy now, may need surgery to remove the capsule and looking for source of GI bleeding  2. Anemia, secondary to GI bleeding, chemo, no evidence of hemolysis  3. Metastatic colon cancer  4 right side pleural effusion, s/p thoracentesis 9/5, cytology (-) 5. Thrombocytopenia, secondary to chemotherapy, consumption from bleeding, stable  6. History of PE, off coumadin    Plan: -I spoke with Dr. Hulen Skains today, pt prefers him to operate if she needs surgery. Dr. Hulen Skains will see her today, but he is not available for the next few days. Dr. Kieth Brightly  likely will operate on her if needed  -I updated her status to surgeon Dr. Clovis Riley at [facility] on chemo until we resolve her GI bleeding issue  -blood transfusion if Hb<8 -appreciate GI input   I will follow up.     Truitt Merle, MD 07/25/2015  4:20 PM

## 2015-07-26 ENCOUNTER — Ambulatory Visit: Payer: BLUE CROSS/BLUE SHIELD

## 2015-07-26 ENCOUNTER — Telehealth: Payer: Self-pay | Admitting: *Deleted

## 2015-07-26 DIAGNOSIS — D649 Anemia, unspecified: Secondary | ICD-10-CM

## 2015-07-26 DIAGNOSIS — N131 Hydronephrosis with ureteral stricture, not elsewhere classified: Secondary | ICD-10-CM

## 2015-07-26 DIAGNOSIS — N1330 Unspecified hydronephrosis: Secondary | ICD-10-CM | POA: Diagnosis present

## 2015-07-26 LAB — COMPREHENSIVE METABOLIC PANEL
ALT: 8 U/L — AB (ref 14–54)
AST: 17 U/L (ref 15–41)
Albumin: 2.1 g/dL — ABNORMAL LOW (ref 3.5–5.0)
Alkaline Phosphatase: 151 U/L — ABNORMAL HIGH (ref 38–126)
Anion gap: 6 (ref 5–15)
BUN: 7 mg/dL (ref 6–20)
CALCIUM: 7.5 mg/dL — AB (ref 8.9–10.3)
CHLORIDE: 103 mmol/L (ref 101–111)
CO2: 26 mmol/L (ref 22–32)
CREATININE: 1.09 mg/dL — AB (ref 0.44–1.00)
GFR, EST NON AFRICAN AMERICAN: 56 mL/min — AB (ref >60)
Glucose, Bld: 99 mg/dL (ref 65–99)
Potassium: 3.6 mmol/L (ref 3.5–5.1)
Sodium: 135 mmol/L (ref 135–145)
TOTAL PROTEIN: 4.6 g/dL — AB (ref 6.5–8.1)
Total Bilirubin: 0.6 mg/dL (ref 0.3–1.2)

## 2015-07-26 LAB — CBC
HCT: 23.2 % — ABNORMAL LOW (ref 36.0–46.0)
Hemoglobin: 7.6 g/dL — ABNORMAL LOW (ref 12.0–15.0)
MCH: 29.2 pg (ref 26.0–34.0)
MCHC: 32.8 g/dL (ref 30.0–36.0)
MCV: 89.2 fL (ref 78.0–100.0)
PLATELETS: 107 10*3/uL — AB (ref 150–400)
RBC: 2.6 MIL/uL — AB (ref 3.87–5.11)
RDW: 16.3 % — ABNORMAL HIGH (ref 11.5–15.5)
WBC: 3.9 10*3/uL — ABNORMAL LOW (ref 4.0–10.5)

## 2015-07-26 NOTE — Progress Notes (Signed)
[facility] Progress Note  Subjective:  CT enterography without definite source of bleeding, but shows capsule retained in mid-distal small bowel.  Received 2 units PRBC's yesterday.  Still having some bleeding and Hgb continues to drift down some.  Tired of full liquids.  Had some abdominal pains earlier but resolved now; they come and go.  Objective:  Vital signs in last 24 hours: Temp:  [98.4 F (36.9 C)-99.1 F (37.3 C)] 98.7 F (37.1 C) (09/22 0459) Pulse Rate:  [76-104] 76 (09/22 0459) Resp:  [16-18] 16 (09/22 0459) BP: (120-146)/(59-75) 128/60 mmHg (09/22 0459) SpO2:  [95 %-100 %] 96 % (09/22 0459) Last BM Date: 07/25/15 General:  Alert, Well-developed, in NAD Heart:  Regular rate and rhythm; no murmurs Pulm:  CTAB.  No W/R/R. Abdomen:  Soft, non-distended.  BS present.  Non-tender.  Small amount of maroon colored blood in ostomy bag.   Extremities:  Without edema. Neurologic:  Alert and  oriented x 4;  grossly normal neurologically. Psych:  Alert and cooperative. Normal mood and affect.  Intake/Output from previous day: 09/21 0701 - 09/22 0700 In: 570 [P.O.:240; Blood:330] Out: -   Lab Results:  Recent Labs  07/25/15 0625 07/25/15 2140 07/26/15 0540  WBC 3.9* 5.5 3.9*  HGB 6.7* 8.3* 7.6*  HCT 20.5* 25.0* 23.2*  PLT 94* 109* 107*   BMET  Recent Labs  07/25/15 0625 07/26/15 0540  NA 134* 135  K 3.4* 3.6  CL 101 103  CO2 26 26  GLUCOSE 102* 99  BUN 10 7  CREATININE 1.16* 1.09*  CALCIUM 7.4* 7.5*   LFT  Recent Labs  07/26/15 0540  PROT 4.6*  ALBUMIN 2.1*  AST 17  ALT 8*  ALKPHOS 151*  BILITOT 0.6   Dg Abd 1 View  07/24/2015   CLINICAL DATA:  Generalized abdominal pain, ingested endoscopy capsule yesterday.  EXAM: ABDOMEN - 1 VIEW  COMPARISON:  None.  FINDINGS: The bowel gas pattern is normal. Endoscopy capsule is seen in left lower quadrant of abdomen. Stool is noted in the rectum. No abnormal calcifications are noted.   IMPRESSION: No evidence of bowel obstruction or ileus. Endoscopy capsule seen in left lower quadrant of abdomen.   Electronically Signed   By: Marijo Conception, M.D.   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 Negative Meckel's scan and negative tagged red blood cell scintigraphy study.  EXAM: CT ABDOMEN AND PELVIS WITH CONTRAST (ENTEROGRAPHY)  TECHNIQUE: Multidetector CT of the abdomen and pelvis during bolus administration of intravenous contrast. Negative oral contrast VoLumen was given.  CONTRAST:  123mL OMNIPAQUE IOHEXOL 300 MG/ML  SOLN  COMPARISON:  03/27/2015 CT of the abdomen and pelvis. 05/03/2015 MRI abdomen.  FINDINGS: Lower chest: Moderate layering right pleural effusion with associated passive dependent right lower lobe atelectasis.  Hepatobiliary: There is a 0.5 cm hypodense lesion in segment 7 of the right lower lobe (series 2/ image 13), characterized as a simple liver cyst on the 05/03/2015 MRI study. There is a heterogeneously enhancing 2.2 x 1.7 cm peritoneal tumor implant along the right posterior liver capsule (2/24), increased from 2.1 x 1.3 cm on  05/03/2015. No new tumor implants are noted adjacent to the liver. Normal gallbladder, with no radiopaque cholelithiasis. No biliary ductal dilatation.  Pancreas: Normal, with no mass or duct dilation.  Spleen: New mild splenomegaly (maximum axial splenic dimension 16.8 cm, previously 12.9 cm). No splenic mass.  Adrenals/Urinary Tract: Normal adrenals. There has been interval removal of the right nephroureteral stent. There is severe right hydroureteronephrosis to the  level of the proximal pelvic segment of the right ureter, worsened since 03/27/2015. There is an asymmetrically delayed right contrast nephrogram indicating right urinary tract obstruction. No left hydronephrosis. Normal caliber left ureter. No renal mass. Collapsed and grossly normal urinary bladder.  Stomach/Bowel: Mildly distended fluid-filled stomach. Normal caliber small bowel. There is prominent circumferential wall thickening continuously involving the small bowel from the level of the mid small bowel to the neo terminal ileum in the right abdomen. The patient is status post ileocecal ectomy with ileocolic anastomosis in the right abdomen. There is an and colostomy in the left ventral abdominal wall. There is severe circumferential wall thickening and mucosal hyper enhancement of the entire residual colon, which is collapsed. The endoscopy capsule is present within a mid to distal small bowel loop in the right lower quadrant (series 3/ image 100). The remnant rectum is collapsed and unremarkable.  Vascular/Lymphatic: Atherosclerotic nonaneurysmal abdominal aorta. Patent portal, splenic, hepatic and renal veins. No abdominopelvic lymphadenopathy.  Reproductive: Grossly normal uterus. There is a somewhat tubular cystic 4.2 x 2.2 cm right adnexal cystic structure (2/60), previously 3.8 x 2.4 cm, not appreciably changed. There is a cystic 2.5 x 2.1 cm left adnexal cystic structure (2/66), which appears new. No gas or significant wall thickening associated with these adnexal cystic structures.  Other: The previously described 0.9 x 0.6 cm right anterior peritoneal implant measures 0.8 x 0.6 cm, not appreciably changed (2/41). No new peritoneal tumor implants are detected. New small volume ascites. Focal fluid collection adjacent to the colostomy stoma in the ventral left abdominal wall likely represents trapped ascitic fluid. No pneumoperitoneum.  Musculoskeletal: No aggressive appearing focal osseous lesions.  Minimal degenerative changes in the visualized thoracolumbar spine.  IMPRESSION: 1. Prominent continuous circumferential wall thickening from the mid small bowel to the neo-terminal ileum. Prominent circumferential wall thickening of the entire residual colon. These findings are most suggestive of a nonspecific infectious or inflammatory enterocolitis. There could be a component of non inflammatory bowel wall edema such as from hypoalbuminemia given the additional findings of small volume ascites and moderate right pleural effusion. 2. Endoscopy capsule is present within a mid to distal small bowel loop in the right lower quadrant. No significant small bowel dilatation or focal small bowel caliber transition to suggest bowel obstruction. 3. Mild interval growth of peritoneal tumor implant along the posterior right liver capsule. Stable separate subcentimeter right anterior peritoneal tumor implant. No new peritoneal tumor implants. 4. Right urinary tract obstruction at the level of the proximal pelvic segment of the right ureter with severe worsening right hydroureteronephrosis. Interval removal of the right nephroureteral stent. 5. New mild splenomegaly. 6. Bilateral cystic adnexal structures, somewhat tubular, differential includes hydrosalpinges.   Electronically Signed   By: Ilona Sorrel M.D.   On: 07/25/2015 16:40    Assessment / Plan: #1 54 yo female with metastatic colon cancer with carcinomatosis- undergoing chemo -with persistent GI bleed of unclear etiology-CT enterography yesterday with no definite source.  Capsule endoscopy 4 days ago- has not passed capsule and imaging showing it in mid-distal small bowel.  Surgery following; Dr. Hulen Skains to review records.  Oncology following as well.  Chemo on hold until other issues sorted out.  ? Next step.  Will discuss with Dr. Hilarie Fredrickson. #2 Acute blood loss anemia:  Received 2 more units PRBC's yesterday.  Hgb still drifting some and still having bleeding in  ostomy bag.  Will continue to monitor Hgb.    LOS: 7 days   ZEHR, JESSICA D.  07/26/2015, 8:59 AM  Pager number [phone]

## 2015-07-26 NOTE — Progress Notes (Signed)
PROGRESS NOTE  Allison Mitchell XIP:[id number] DOB: [birth year] DOA: 07/19/2015 PCP: [name], MD   HPI: Allison Mitchell is a 54 y.o white female who presents to the unit following several weeks of maroon-colored watery stool in her ostomy bag. Recent endoscopy (upper and lower) per Dr. Burr Medico unable to reveal source. Patient admitted for further workup of GI bleed. Initally laboratory revealed a hemoglobin of 7.4, which required a transfusion. Throughout her hospital course thus far, multiple transfusions have been required due to decreasing hemoglobin. RBC scan, capsule study, and Meckel's unable to find etiology. PMH includes stage III colon cancer s/p right hemicolectomy on 10/10/14 by Dr. Hulen Skains.    Subjective / 24 H Interval events Today the patient is doing well, although she expresses frustration with still being here. She states that she had to drain her ostomy bag approximately 6x last night (all watery, maroon colored). Still has not passed capsule. Denies fever, chills, N/V/D. Reports slight cramping in suprapubic area that has since resolved. Denies chest pain, dizziness, or fatigue.     Assessment/Plan: Principal Problem:   Acute blood loss anemia Active Problems:   Colorectal cancer, stage III   Hypokalemia   Symptomatic anemia   Bleeding gastrointestinal   Occult GI bleeding   Foreign body in digestive system   CKD (chronic kidney disease) stage 2, GFR 60-89 ml/min   Metastatic colon cancer to liver   Hydronephrosis   Acute blood loss anemia due to occult GI bleeding - Ongoing, without clear source, continues to require blood transfusions (s/p 2U pRBC 9/15, 2U 9/16 and 2U 9/21). She remains asymptomatic of current anemia.  - GI following w/ negative workup thus far. CT-Enterography done without source of bleeding identified. Capsule in RLQ, no obstruction. Interval growth of peritoneal tumor. Circumferential bowel wall thickened and splenomegaly noted.  - recently had an  EGD and a lower endoscopy this month without acute findings - GI, Oncology, General surgery following.   Right hydroureteronephrosis - this is acute on chronic - CT-Entero showed R urinary tract obstruction and severe worsening hydroureteronephrosis.  - Patient had stent removed by Dr. Tresa Moore on 07/17/15. - + Flank tenderness, but no urinary symptoms  - discussed with Dr. Tresa Moore, he will see    Stage III Colon Cancer  - S/p right hemicolectomy with current colostomy.  - Managed at the cancer center w/ chemo and radiation therapy, currently being considered for cytoreductive surgery and [facility] - Oncology following, for now chemo is on hold  CKD stage II - Cr remains overall stable, continue to monitor  Chemotherapy induced pancytopenia - Remains stable, closely monitor platelets/WBC.   Hypokalemia - Suspected to be secondary to increased GI loss, but WNL today. - Monitor and replete as needed  Hyponatremia - WNL today. Continue to monitor.    Diet: ful liquid Fluids: NS DVT Prophylaxis: SCD  Code Status: Full Code Family Communication: None at bedside   Disposition Plan: Home when medically stable   Consultants:  GI   IR   Oncology   Surgery   Urology   Procedures:  RBC scan   Capsule study   Meckel's scan    Antibiotics  Anti-infectives    None      Studies  Ct Entero Abd/pelvis W/cm  07/25/2015   CLINICAL DATA:  54 year old female with a history of cecal colon cancer diagnosed in December 2015 status post resection with end colostomy, status post radiation therapy and ongoing chemotherapy, with peritoneal carcinomatosis, presenting with GI bleeding, with  a suspected small bowel source, with suspected retention of endoscopy capsule in the distal small bowel. Negative Meckel's scan and negative tagged red blood cell scintigraphy study.  EXAM: CT ABDOMEN AND PELVIS WITH CONTRAST (ENTEROGRAPHY)  TECHNIQUE: Multidetector CT of the abdomen and pelvis  during bolus administration of intravenous contrast. Negative oral contrast VoLumen was given.  CONTRAST:  169mL OMNIPAQUE IOHEXOL 300 MG/ML  SOLN  COMPARISON:  03/27/2015 CT of the abdomen and pelvis. 05/03/2015 MRI abdomen.  FINDINGS: Lower chest: Moderate layering right pleural effusion with associated passive dependent right lower lobe atelectasis.  Hepatobiliary: There is a 0.5 cm hypodense lesion in segment 7 of the right lower lobe (series 2/ image 13), characterized as a simple liver cyst on the 05/03/2015 MRI study. There is a heterogeneously enhancing 2.2 x 1.7 cm peritoneal tumor implant along the right posterior liver capsule (2/24), increased from 2.1 x 1.3 cm on 05/03/2015. No new tumor implants are noted adjacent to the liver. Normal gallbladder, with no radiopaque cholelithiasis. No biliary ductal dilatation.  Pancreas: Normal, with no mass or duct dilation.  Spleen: New mild splenomegaly (maximum axial splenic dimension 16.8 cm, previously 12.9 cm). No splenic mass.  Adrenals/Urinary Tract: Normal adrenals. There has been interval removal of the right nephroureteral stent. There is severe right hydroureteronephrosis to the level of the proximal pelvic segment of the right ureter, worsened since 03/27/2015. There is an asymmetrically delayed right contrast nephrogram indicating right urinary tract obstruction. No left hydronephrosis. Normal caliber left ureter. No renal mass. Collapsed and grossly normal urinary bladder.  Stomach/Bowel: Mildly distended fluid-filled stomach. Normal caliber small bowel. There is prominent circumferential wall thickening continuously involving the small bowel from the level of the mid small bowel to the neo terminal ileum in the right abdomen. The patient is status post ileocecal ectomy with ileocolic anastomosis in the right abdomen. There is an and colostomy in the left ventral abdominal wall. There is severe circumferential wall thickening and mucosal hyper  enhancement of the entire residual colon, which is collapsed. The endoscopy capsule is present within a mid to distal small bowel loop in the right lower quadrant (series 3/ image 100). The remnant rectum is collapsed and unremarkable.  Vascular/Lymphatic: Atherosclerotic nonaneurysmal abdominal aorta. Patent portal, splenic, hepatic and renal veins. No abdominopelvic lymphadenopathy.  Reproductive: Grossly normal uterus. There is a somewhat tubular cystic 4.2 x 2.2 cm right adnexal cystic structure (2/60), previously 3.8 x 2.4 cm, not appreciably changed. There is a cystic 2.5 x 2.1 cm left adnexal cystic structure (2/66), which appears new. No gas or significant wall thickening associated with these adnexal cystic structures.  Other: The previously described 0.9 x 0.6 cm right anterior peritoneal implant measures 0.8 x 0.6 cm, not appreciably changed (2/41). No new peritoneal tumor implants are detected. New small volume ascites. Focal fluid collection adjacent to the colostomy stoma in the ventral left abdominal wall likely represents trapped ascitic fluid. No pneumoperitoneum.  Musculoskeletal: No aggressive appearing focal osseous lesions. Minimal degenerative changes in the visualized thoracolumbar spine.  IMPRESSION: 1. Prominent continuous circumferential wall thickening from the mid small bowel to the neo-terminal ileum. Prominent circumferential wall thickening of the entire residual colon. These findings are most suggestive of a nonspecific infectious or inflammatory enterocolitis. There could be a component of non inflammatory bowel wall edema such as from hypoalbuminemia given the additional findings of small volume ascites and moderate right pleural effusion. 2. Endoscopy capsule is present within a mid to distal small bowel loop in  the right lower quadrant. No significant small bowel dilatation or focal small bowel caliber transition to suggest bowel obstruction. 3. Mild interval growth of peritoneal  tumor implant along the posterior right liver capsule. Stable separate subcentimeter right anterior peritoneal tumor implant. No new peritoneal tumor implants. 4. Right urinary tract obstruction at the level of the proximal pelvic segment of the right ureter with severe worsening right hydroureteronephrosis. Interval removal of the right nephroureteral stent. 5. New mild splenomegaly. 6. Bilateral cystic adnexal structures, somewhat tubular, differential includes hydrosalpinges.   Electronically Signed   By: Ilona Sorrel M.D.   On: 07/25/2015 16:40    Objective  Filed Vitals:   07/25/15 1352 07/25/15 1430 07/25/15 2118 07/26/15 0459  BP: 137/62 141/63 146/69 128/60  Pulse: 90 84 104 76  Temp: 99 F (37.2 C) 99.1 F (37.3 C) 98.7 F (37.1 C) 98.7 F (37.1 C)  TempSrc: Oral Oral Oral Oral  Resp: 16 18 16 16   Height:      Weight:      SpO2:  100% 95% 96%    Intake/Output Summary (Last 24 hours) at 07/26/15 1122 Last data filed at 07/25/15 1400  Gross per 24 hour  Intake    570 ml  Output      0 ml  Net    570 ml   Filed Weights   07/22/15 1433  Weight: 92.08 kg (203 lb)   Exam:  GENERAL: Well- appearing woman, sitting in bed with no acute distress.   HEENT: head NCAT, no scleral icterus. Mucous membranes are moist.   NECK: Supple. No lymphadenopathy or thyromegaly.  LUNGS: Clear to auscultation. No wheezing or crackles  HEART: Regular rate and rhythm with mild systolic ejection murmur. No JVD, no peripheral edema  ABDOMEN: Soft and nondistended. Positive bowel sounds. + Flank pain on R side. Mild suprapubic TTP. Colostomy bag in LLQ filled with moderate amount of maroon contents.   EXTREMITIES: Without any cyanosis, clubbing, rash, lesions or edema.  NEUROLOGIC: Alert and oriented x3. Appropriate movement of upper and lower extremities  PSYCHIATRIC: Appropriate mood and more flattened affect today.   SKIN: No ulceration or induration present.  Data Reviewed: Basic  Metabolic Panel:  Recent Labs Lab 07/20/15 0520 07/21/15 0607 07/25/15 0625 07/26/15 0540  NA 139 139 134* 135  K 3.3* 3.6 3.4* 3.6  CL 106 107 101 103  CO2 27 26 26 26   GLUCOSE 106* 103* 102* 99  BUN 14 11 10 7   CREATININE 1.05* 1.02* 1.16* 1.09*  CALCIUM 7.7* 7.8* 7.4* 7.5*   Liver Function Tests:  Recent Labs Lab 07/26/15 0540  AST 17  ALT 8*  ALKPHOS 151*  BILITOT 0.6  PROT 4.6*  ALBUMIN 2.1*   CBC:  Recent Labs Lab 07/23/15 0453 07/24/15 0938 07/25/15 0625 07/25/15 2140 07/26/15 0540  WBC 3.9* 3.7* 3.9* 5.5 3.9*  NEUTROABS 3.1  --   --   --   --   HGB 7.8* 7.8* 6.7* 8.3* 7.6*  HCT 23.9* 23.5* 20.5* 25.0* 23.2*  MCV 89.2 88.0 89.1 88.7 89.2  PLT 78* 101* 94* 109* 107*    Recent Results (from the past 240 hour(s))  TECHNOLOGIST REVIEW     Status: None   Collection Time: 07/19/15  8:01 AM  Result Value Ref Range Status   Technologist Review rare rbc fragment present  Final  MRSA PCR Screening     Status: None   Collection Time: 07/19/15  2:22 PM  Result Value Ref Range  Status   MRSA by PCR NEGATIVE NEGATIVE Final    Comment:        The GeneXpert MRSA Assay (FDA approved for NASAL specimens only), is one component of a comprehensive MRSA colonization surveillance program. It is not intended to diagnose MRSA infection nor to guide or monitor treatment for MRSA infections.      Scheduled Meds: . sodium chloride   Intravenous Once  . sodium chloride   Intravenous Once  . citalopram  20 mg Oral q morning - 10a  . pantoprazole (PROTONIX) IV  40 mg Intravenous Q12H  . potassium chloride SA  20 mEq Oral Daily  . sodium chloride  3 mL Intravenous Q12H  . sodium chloride  3 mL Intravenous Q12H      Micayla Zeltman, Student-PA  Marzetta Board, MD [name] Pager [phone]. If 7 PM - 7 AM, please contact night-coverage at [email or web address], password [facility] 07/26/2015, 11:22 AM  LOS: 7 days

## 2015-07-26 NOTE — Progress Notes (Signed)
4 Days Post-Op  Subjective: She has some cramping, this AM capsule has not come out as far as she know, it is not in her ostomy bag this AM. Her ostomy drainage still looks bloody..  She is tolerating PO's, on full liquids.    Objective: Vital signs in last 24 hours: Temp:  [98.4 F (36.9 C)-99.1 F (37.3 C)] 98.7 F (37.1 C) (09/22 0459) Pulse Rate:  [76-104] 76 (09/22 0459) Resp:  [16-18] 16 (09/22 0459) BP: (120-146)/(59-75) 128/60 mmHg (09/22 0459) SpO2:  [95 %-100 %] 96 % (09/22 0459) Last BM Date: 07/25/15 240  PO recorded 5 stools recorded Afebrile, VSS H/H dow some, Creatinine is stable CT enterography:  Prominent continuous circumferential wall thickening from the mid small bowel to the neo-terminal ileum. Prominent circumferential wall thickening of the entire residual colon.  Uncertain etiology:  Possible infectious, inflammatory or secondary to low albumin.  Endoscopy capsule in mid to distal SB, no obstruction noted. Interval growth of peritoneal tumor implants.  Right urinary tract obstruction right, new splenomegaly. Intake/Output from previous day: 09/21 0701 - 09/22 0700 In: 570 [P.O.:240; Blood:330] Out: -  Intake/Output this shift:    General appearance: alert, cooperative, no distress and having some stomach cramps GI: soft sore, incisions all healed well.  Few BS, ostomy drainage looks brownish-red thin, gritty fluid.  Lab Results:   Recent Labs  07/25/15 2140 07/26/15 0540  WBC 5.5 3.9*  HGB 8.3* 7.6*  HCT 25.0* 23.2*  PLT 109* 107*    BMET  Recent Labs  07/25/15 0625 07/26/15 0540  NA 134* 135  K 3.4* 3.6  CL 101 103  CO2 26 26  GLUCOSE 102* 99  BUN 10 7  CREATININE 1.16* 1.09*  CALCIUM 7.4* 7.5*   PT/INR No results for input(s): LABPROT, INR in the last 72 hours.   Recent Labs Lab 07/19/15 0801 07/26/15 0540  AST 21 17  ALT 10 8*  ALKPHOS 178* 151*  BILITOT 0.57 0.6  PROT 5.1* 4.6*  ALBUMIN 2.5* 2.1*     Lipase  No  results found for: LIPASE   Studies/Results: Dg Abd 1 View  07/24/2015   CLINICAL DATA:  Generalized abdominal pain, ingested endoscopy capsule yesterday.  EXAM: ABDOMEN - 1 VIEW  COMPARISON:  None.  FINDINGS: The bowel gas pattern is normal. Endoscopy capsule is seen in left lower quadrant of abdomen. Stool is noted in the rectum. No abnormal calcifications are noted.  IMPRESSION: No evidence of bowel obstruction or ileus. Endoscopy capsule seen in left lower quadrant of abdomen.   Electronically Signed   By: Marijo Conception, M.D.   On: 07/24/2015 11:21   Nm Bowel Img Meckels  07/24/2015   CLINICAL DATA:  55 year old with 2 month history of GI bleeding. History of colon cancer and colostomy.  EXAM: NUCLEAR MEDICINE MECKEL'S SCAN  TECHNIQUE: Sequential abdominal images were obtained following intravenous injection of radiopharmaceutical.  RADIOPHARMACEUTICALS:  9.98 mCi technetium 36m pertechnetate intravenously.  COMPARISON:  Nuclear medicine bleeding study 07/20/2015 and abdominal CT 03/27/2015.  FINDINGS: No abnormal activity is identified within the gastrointestinal tract. There is normal activity within the stomach and bladder.  IMPRESSION: Negative Meckel's scan.  There is no abnormal GI uptake.   Electronically Signed   By: Richardean Sale M.D.   On: 07/24/2015 09:16   Ct Entero Abd/pelvis W/cm  07/25/2015   CLINICAL DATA:  54 year old female with a history of cecal colon cancer diagnosed in December 2015 status post resection with end  colostomy, status post radiation therapy and ongoing chemotherapy, with peritoneal carcinomatosis, presenting with GI bleeding, with a suspected small bowel source, with suspected retention of endoscopy capsule in the distal small bowel. Negative Meckel's scan and negative tagged red blood cell scintigraphy study.  EXAM: CT ABDOMEN AND PELVIS WITH CONTRAST (ENTEROGRAPHY)  TECHNIQUE: Multidetector CT of the abdomen and pelvis during bolus administration of intravenous  contrast. Negative oral contrast VoLumen was given.  CONTRAST:  185mL OMNIPAQUE IOHEXOL 300 MG/ML  SOLN  COMPARISON:  03/27/2015 CT of the abdomen and pelvis. 05/03/2015 MRI abdomen.  FINDINGS: Lower chest: Moderate layering right pleural effusion with associated passive dependent right lower lobe atelectasis.  Hepatobiliary: There is a 0.5 cm hypodense lesion in segment 7 of the right lower lobe (series 2/ image 13), characterized as a simple liver cyst on the 05/03/2015 MRI study. There is a heterogeneously enhancing 2.2 x 1.7 cm peritoneal tumor implant along the right posterior liver capsule (2/24), increased from 2.1 x 1.3 cm on 05/03/2015. No new tumor implants are noted adjacent to the liver. Normal gallbladder, with no radiopaque cholelithiasis. No biliary ductal dilatation.  Pancreas: Normal, with no mass or duct dilation.  Spleen: New mild splenomegaly (maximum axial splenic dimension 16.8 cm, previously 12.9 cm). No splenic mass.  Adrenals/Urinary Tract: Normal adrenals. There has been interval removal of the right nephroureteral stent. There is severe right hydroureteronephrosis to the level of the proximal pelvic segment of the right ureter, worsened since 03/27/2015. There is an asymmetrically delayed right contrast nephrogram indicating right urinary tract obstruction. No left hydronephrosis. Normal caliber left ureter. No renal mass. Collapsed and grossly normal urinary bladder.  Stomach/Bowel: Mildly distended fluid-filled stomach. Normal caliber small bowel. There is prominent circumferential wall thickening continuously involving the small bowel from the level of the mid small bowel to the neo terminal ileum in the right abdomen. The patient is status post ileocecal ectomy with ileocolic anastomosis in the right abdomen. There is an and colostomy in the left ventral abdominal wall. There is severe circumferential wall thickening and mucosal hyper enhancement of the entire residual colon, which is  collapsed. The endoscopy capsule is present within a mid to distal small bowel loop in the right lower quadrant (series 3/ image 100). The remnant rectum is collapsed and unremarkable.  Vascular/Lymphatic: Atherosclerotic nonaneurysmal abdominal aorta. Patent portal, splenic, hepatic and renal veins. No abdominopelvic lymphadenopathy.  Reproductive: Grossly normal uterus. There is a somewhat tubular cystic 4.2 x 2.2 cm right adnexal cystic structure (2/60), previously 3.8 x 2.4 cm, not appreciably changed. There is a cystic 2.5 x 2.1 cm left adnexal cystic structure (2/66), which appears new. No gas or significant wall thickening associated with these adnexal cystic structures.  Other: The previously described 0.9 x 0.6 cm right anterior peritoneal implant measures 0.8 x 0.6 cm, not appreciably changed (2/41). No new peritoneal tumor implants are detected. New small volume ascites. Focal fluid collection adjacent to the colostomy stoma in the ventral left abdominal wall likely represents trapped ascitic fluid. No pneumoperitoneum.  Musculoskeletal: No aggressive appearing focal osseous lesions. Minimal degenerative changes in the visualized thoracolumbar spine.  IMPRESSION: 1. Prominent continuous circumferential wall thickening from the mid small bowel to the neo-terminal ileum. Prominent circumferential wall thickening of the entire residual colon. These findings are most suggestive of a nonspecific infectious or inflammatory enterocolitis. There could be a component of non inflammatory bowel wall edema such as from hypoalbuminemia given the additional findings of small volume ascites and moderate right  pleural effusion. 2. Endoscopy capsule is present within a mid to distal small bowel loop in the right lower quadrant. No significant small bowel dilatation or focal small bowel caliber transition to suggest bowel obstruction. 3. Mild interval growth of peritoneal tumor implant along the posterior right liver  capsule. Stable separate subcentimeter right anterior peritoneal tumor implant. No new peritoneal tumor implants. 4. Right urinary tract obstruction at the level of the proximal pelvic segment of the right ureter with severe worsening right hydroureteronephrosis. Interval removal of the right nephroureteral stent. 5. New mild splenomegaly. 6. Bilateral cystic adnexal structures, somewhat tubular, differential includes hydrosalpinges.   Electronically Signed   By: Ilona Sorrel M.D.   On: 07/25/2015 16:40    Medications: . sodium chloride   Intravenous Once  . sodium chloride   Intravenous Once  . citalopram  20 mg Oral q morning - 10a  . pantoprazole (PROTONIX) IV  40 mg Intravenous Q12H  . potassium chloride SA  20 mEq Oral Daily  . sodium chloride  3 mL Intravenous Q12H  . sodium chloride  3 mL Intravenous Q12H      Prior to Admission medications   Medication Sig Start Date End Date Taking? Authorizing Provider  acetaminophen (TYLENOL) 325 MG tablet Take 325 mg by mouth every 6 (six) hours as needed for moderate pain.   Yes Historical Provider, MD  citalopram (CELEXA) 20 MG tablet Take 20 mg by mouth every morning.  09/18/14  Yes Historical Provider, MD  lidocaine-prilocaine (EMLA) cream Apply 1 application topically as needed. Patient taking differently: Apply 1 application topically as needed (port).  11/16/14  Yes Truitt Merle, MD  magnesium hydroxide (MILK OF MAGNESIA) 400 MG/5ML suspension Take 5 mLs by mouth daily as needed for mild constipation.   Yes Historical Provider, MD  omeprazole (PRILOSEC) 40 MG capsule Take 40 mg by mouth daily.   Yes Historical Provider, MD  ondansetron (ZOFRAN) 8 MG tablet Take 1 tablet (8 mg total) by mouth every 8 (eight) hours as needed for nausea or vomiting. 01/04/15  Yes Truitt Merle, MD  oxyCODONE (OXY IR/ROXICODONE) 5 MG immediate release tablet Take 1 tablet (5 mg total) by mouth every 6 (six) hours as needed for severe pain. 05/16/15  Yes Truitt Merle, MD   pantoprazole (PROTONIX) 40 MG tablet Take 1 tablet (40 mg total) by mouth 2 (two) times daily. 06/14/15  Yes Truitt Merle, MD  potassium chloride SA (K-DUR,KLOR-CON) 20 MEQ tablet Take 1 tablet (20 mEq total) by mouth daily. Take as directed. 06/28/15  Yes Truitt Merle, MD  PREVIDENT 5000 BOOSTER PLUS 1.1 % PSTE 1 application by Other route daily.  03/20/15  Yes Historical Provider, MD  zolpidem (AMBIEN) 5 MG tablet Take 1 tablet (5 mg total) by mouth at bedtime as needed for sleep. 05/31/15  Yes Truitt Merle, MD  \  Assessment/Plan GI bleeding Probable carcinomatosis on CT scan  Stage III colon cancer (adenocarcinoma)  S/p en bloc resection with right colectomy, sigmoid colectomy, colostomy revision, , ureteral reimplantation on the right, 12 /08/15, Dr. Doreen Salvage Chemotherapy and radiation therapy Anemia and neuropathy associated with chemotherapy Hx of PE Anxiety/Depression GERD Antibiotics:  None DVT:  No heparin secondary to bleeding   Plan:  Still no bleeding source isolated.  Dr. Hulen Skains saw her yesterday evening.  We will continue to follow.    LOS: 7 days    Allison Mitchell 07/26/2015

## 2015-07-26 NOTE — Telephone Encounter (Signed)
"  Could l have a call from [place] or [place].  Have questions I'd like answered."

## 2015-07-26 NOTE — Consult Note (Signed)
Reason for Consult: Malignant RIght Hydronephrosis  Referring Physician: C. Gherghe MD  Allison Mitchell is an 54 y.o. female.   HPI:   1 - Right Malignant Hydronephrosis - s/p open right ureterolysis / 7x24 JJ stent 10/2014 for stage 4 colon cancer (tumor directly encasing distal ureter confirmed pathologically).  Recent Course -  Cr 0.8 12/2014, stent in good positoin by CT 11/2014. 04/2015 - stent change 07/2015 - office stent pull, CT 07/2015 with some progression of Rt hydro. Cr 1.1.   Today "Allison Mitchell" is seen in consultation for above, specifically for re-evaluation of right malighant hydro. No interval fevers or colicky flank pain. She overall feels she has better quality of life with stent out.   Past Medical History  Diagnosis Date  . GERD (gastroesophageal reflux disease)   . Depression   . Anxiety   . 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 Peripheral neuropathy due to chemotherapy   . Hydronephrosis, right     malignant--  s/p reimiplant right ureter  . History of pulmonary embolus (PE)     11-28-2014--  due to chemotherapy on coumadin  . Anticoagulated on Coumadin   . Dysuria   . S/P radiation therapy 03/02/15    completed pelvis/abd/50.4Gy  . Allergy     SEASONAL  . Blood transfusion without reported diagnosis   . Colorectal cancer, stage III oncologist--  dr Stefani Dama    Dx  10-10-2014---Stage IIIC, [id number], Multifocal synchronous, Moderate differentiated Invasive, Mets to nodes (5 out of 17)/  s/p  Resection tumor cecum, terminal ileum, sigmoid/  currently chemoradiation therapy    Past Surgical History  Procedure Laterality Date  . Cystoscopy with retrograde pyelogram, ureteroscopy and stent placement Right 10/05/2014    Procedure: [place], URETEROSCOPY  AND STENT PLACEMENT;  Surgeon: Alexis Frock, MD;  Location: [facility];  Service: Urology;  Laterality: Right;  . Laparotomy N/A 10/10/2014    Procedure: EXPLORATORY  LAPAROTOMY;  Surgeon: Doreen Salvage, MD;  Location: [place];  Service: General;  Laterality: N/A;  . Partial colectomy  10/10/2014    Procedure: PARTIAL SIGMOID COLECTOMY;  Surgeon: Doreen Salvage, MD;  Location: [place];  Service: General;;  . Colostomy  10/10/2014    Procedure: COLOSTOMY;  Surgeon: Doreen Salvage, MD;  Location: [place];  Service: General;;  . Colostomy revision  10/10/2014    Procedure: PARTIAL RIGHT COLECTOMY;  Surgeon: Doreen Salvage, MD;  Location: [place];  Service: General;;  . Ureteral reimplantion Right 10/10/2014    Procedure: OPEN RIGHT URETERAL LYSIS ;  Surgeon: Alexis Frock, MD;  Location: [place];  Service: Urology;  Laterality: Right;  . Portacath placement N/A 10/17/2014    Procedure: INSERTION PORT-A-CATH LEFT SUBCLAVIAN AND MIDLINE INCISION STAPLE REMOVAL ;  Surgeon: Coralie Keens, MD;  Location: [place];  Service: General;  Laterality: N/A;  . Cystoscopy w/ retrogrades Right 01/10/2015    Procedure: CYSTOSCOPY WITH RETROGRADE PYELOGRAM;  Surgeon: Alexis Frock, MD;  Location: [facility];  Service: Urology;  Laterality: Right;  . Cystoscopy w/ ureteral stent placement Right 01/10/2015    Procedure: CYSTOSCOPY WITH STENT REPLACEMENT;  Surgeon: Alexis Frock, MD;  Location: [facility];  Service: Urology;  Laterality: Right;  . Colonoscopy    . Enteroscopy N/A 07/16/2015    Procedure: ENTEROSCOPY;  Surgeon: Gatha Mayer, MD;  Location: [place];  Service: Endoscopy;  Laterality: N/A;  . G[place] capsule study N/A 07/22/2015  Procedure: GIVENS CAPSULE STUDY;  Surgeon: Manus Gunning, MD;  Location: [facility];  Service: Gastroenterology;  Laterality: N/A;    Family History  Problem Relation Age of Onset  . Cancer Mother     skin cancer   . Diabetes Father   . CAD Father     Onset in his 56s, has 7 stents  . Heart disease Father   . Cancer Maternal Grandmother 17    colon cancer   . Breast cancer Maternal Grandmother   . Colon cancer  Maternal Grandmother   . Cancer Paternal Grandmother 49    breast cancer   . Breast cancer Paternal Grandmother     Social History:  reports that she has never smoked. She has never used smokeless tobacco. She reports that she does not drink alcohol or use illicit drugs.  Allergies:  Allergies  Allergen Reactions  . Amoxicillin Other (See Comments)    Yeast infection      Results for orders placed or performed during the hospital encounter of 07/19/15 (from the past 48 hour(s))  CBC     Status: Abnormal   Collection Time: 07/25/15  6:25 AM  Result Value Ref Range   WBC 3.9 (L) 4.0 - 10.5 K/uL   RBC 2.30 (L) 3.87 - 5.11 MIL/uL   Hemoglobin 6.7 (LL) 12.0 - 15.0 g/dL    Comment: REPEATED TO VERIFY CRITICAL RESULT CALLED TO, READ BACK BY AND VERIFIED WITH: M. MILLS RN AT [phone] ON 09.21.16 BY SHUEA    HCT 20.5 (L) 36.0 - 46.0 %   MCV 89.1 78.0 - 100.0 fL   MCH 29.1 26.0 - 34.0 pg   MCHC 32.7 30.0 - 36.0 g/dL   RDW 17.0 (H) 11.5 - 15.5 %   Platelets 94 (L) 150 - 400 K/uL    Comment: CONSISTENT WITH PREVIOUS RESULT  Basic metabolic panel     Status: Abnormal   Collection Time: 07/25/15  6:25 AM  Result Value Ref Range   Sodium 134 (L) 135 - 145 mmol/L   Potassium 3.4 (L) 3.5 - 5.1 mmol/L   Chloride 101 101 - 111 mmol/L   CO2 26 22 - 32 mmol/L   Glucose, Bld 102 (H) 65 - 99 mg/dL   BUN 10 6 - 20 mg/dL   Creatinine, Ser 1.16 (H) 0.44 - 1.00 mg/dL   Calcium 7.4 (L) 8.9 - 10.3 mg/dL   GFR calc non Af Amer 52 (L) >60 mL/min   GFR calc Af Amer >60 >60 mL/min    Comment: (NOTE) The eGFR has been calculated using the CKD EPI equation. This calculation has not been validated in all clinical situations. eGFR's persistently <60 mL/min signify possible Chronic Kidney Disease.    Anion gap 7 5 - 15  Prepare RBC     Status: None   Collection Time: 07/25/15  7:00 AM  Result Value Ref Range   Order Confirmation ORDER PROCESSED BY BLOOD BANK   Type and screen     Status: None    Collection Time: 07/25/15  7:50 AM  Result Value Ref Range   ABO/RH(D) A POS    Antibody Screen NEG    Sample Expiration 07/28/2015    Unit Number [id number]    Blood Component Type RED CELLS,LR    Unit division 00    Status of Unit ISSUED,FINAL    Transfusion Status OK TO TRANSFUSE    Crossmatch Result Compatible   CBC     Status: Abnormal  Collection Time: 07/25/15  9:40 PM  Result Value Ref Range   WBC 5.5 4.0 - 10.5 K/uL   RBC 2.82 (L) 3.87 - 5.11 MIL/uL   Hemoglobin 8.3 (L) 12.0 - 15.0 g/dL   HCT 25.0 (L) 36.0 - 46.0 %   MCV 88.7 78.0 - 100.0 fL   MCH 29.4 26.0 - 34.0 pg   MCHC 33.2 30.0 - 36.0 g/dL   RDW 16.1 (H) 11.5 - 15.5 %   Platelets 109 (L) 150 - 400 K/uL    Comment: CONSISTENT WITH PREVIOUS RESULT  CBC     Status: Abnormal   Collection Time: 07/26/15  5:40 AM  Result Value Ref Range   WBC 3.9 (L) 4.0 - 10.5 K/uL   RBC 2.60 (L) 3.87 - 5.11 MIL/uL   Hemoglobin 7.6 (L) 12.0 - 15.0 g/dL   HCT 23.2 (L) 36.0 - 46.0 %   MCV 89.2 78.0 - 100.0 fL   MCH 29.2 26.0 - 34.0 pg   MCHC 32.8 30.0 - 36.0 g/dL   RDW 16.3 (H) 11.5 - 15.5 %   Platelets 107 (L) 150 - 400 K/uL    Comment: CONSISTENT WITH PREVIOUS RESULT  Comprehensive metabolic panel     Status: Abnormal   Collection Time: 07/26/15  5:40 AM  Result Value Ref Range   Sodium 135 135 - 145 mmol/L   Potassium 3.6 3.5 - 5.1 mmol/L   Chloride 103 101 - 111 mmol/L   CO2 26 22 - 32 mmol/L   Glucose, Bld 99 65 - 99 mg/dL   BUN 7 6 - 20 mg/dL   Creatinine, Ser 1.09 (H) 0.44 - 1.00 mg/dL   Calcium 7.5 (L) 8.9 - 10.3 mg/dL   Total Protein 4.6 (L) 6.5 - 8.1 g/dL   Albumin 2.1 (L) 3.5 - 5.0 g/dL   AST 17 15 - 41 U/L   ALT 8 (L) 14 - 54 U/L   Alkaline Phosphatase 151 (H) 38 - 126 U/L   Total Bilirubin 0.6 0.3 - 1.2 mg/dL   GFR calc non Af Amer 56 (L) >60 mL/min   GFR calc Af Amer >60 >60 mL/min    Comment: (NOTE) The eGFR has been calculated using the CKD EPI equation. This calculation has not been validated in  all clinical situations. eGFR's persistently <60 mL/min signify possible Chronic Kidney Disease.    Anion gap 6 5 - 15    Ct Entero Abd/pelvis W/cm  07/25/2015   CLINICAL DATA:  54 year old female with a history of cecal colon cancer diagnosed in December 2015 status post resection with end colostomy, status post radiation therapy and ongoing chemotherapy, with peritoneal carcinomatosis, presenting with GI bleeding, with a suspected small bowel source, with suspected retention of endoscopy capsule in the distal small bowel. Negative Meckel's scan and negative tagged red blood cell scintigraphy study.  EXAM: CT ABDOMEN AND PELVIS WITH CONTRAST (ENTEROGRAPHY)  TECHNIQUE: Multidetector CT of the abdomen and pelvis during bolus administration of intravenous contrast. Negative oral contrast VoLumen was given.  CONTRAST:  184m OMNIPAQUE IOHEXOL 300 MG/ML  SOLN  COMPARISON:  03/27/2015 CT of the abdomen and pelvis. 05/03/2015 MRI abdomen.  FINDINGS: Lower chest: Moderate layering right pleural effusion with associated passive dependent right lower lobe atelectasis.  Hepatobiliary: There is a 0.5 cm hypodense lesion in segment 7 of the right lower lobe (series 2/ image 13), characterized as a simple liver cyst on the 05/03/2015 MRI study. There is a heterogeneously enhancing 2.2 x 1.7 cm peritoneal  tumor implant along the right posterior liver capsule (2/24), increased from 2.1 x 1.3 cm on 05/03/2015. No new tumor implants are noted adjacent to the liver. Normal gallbladder, with no radiopaque cholelithiasis. No biliary ductal dilatation.  Pancreas: Normal, with no mass or duct dilation.  Spleen: New mild splenomegaly (maximum axial splenic dimension 16.8 cm, previously 12.9 cm). No splenic mass.  Adrenals/Urinary Tract: Normal adrenals. There has been interval removal of the right nephroureteral stent. There is severe right hydroureteronephrosis to the level of the proximal pelvic segment of the right ureter,  worsened since 03/27/2015. There is an asymmetrically delayed right contrast nephrogram indicating right urinary tract obstruction. No left hydronephrosis. Normal caliber left ureter. No renal mass. Collapsed and grossly normal urinary bladder.  Stomach/Bowel: Mildly distended fluid-filled stomach. Normal caliber small bowel. There is prominent circumferential wall thickening continuously involving the small bowel from the level of the mid small bowel to the neo terminal ileum in the right abdomen. The patient is status post ileocecal ectomy with ileocolic anastomosis in the right abdomen. There is an and colostomy in the left ventral abdominal wall. There is severe circumferential wall thickening and mucosal hyper enhancement of the entire residual colon, which is collapsed. The endoscopy capsule is present within a mid to distal small bowel loop in the right lower quadrant (series 3/ image 100). The remnant rectum is collapsed and unremarkable.  Vascular/Lymphatic: Atherosclerotic nonaneurysmal abdominal aorta. Patent portal, splenic, hepatic and renal veins. No abdominopelvic lymphadenopathy.  Reproductive: Grossly normal uterus. There is a somewhat tubular cystic 4.2 x 2.2 cm right adnexal cystic structure (2/60), previously 3.8 x 2.4 cm, not appreciably changed. There is a cystic 2.5 x 2.1 cm left adnexal cystic structure (2/66), which appears new. No gas or significant wall thickening associated with these adnexal cystic structures.  Other: The previously described 0.9 x 0.6 cm right anterior peritoneal implant measures 0.8 x 0.6 cm, not appreciably changed (2/41). No new peritoneal tumor implants are detected. New small volume ascites. Focal fluid collection adjacent to the colostomy stoma in the ventral left abdominal wall likely represents trapped ascitic fluid. No pneumoperitoneum.  Musculoskeletal: No aggressive appearing focal osseous lesions. Minimal degenerative changes in the visualized thoracolumbar  spine.  IMPRESSION: 1. Prominent continuous circumferential wall thickening from the mid small bowel to the neo-terminal ileum. Prominent circumferential wall thickening of the entire residual colon. These findings are most suggestive of a nonspecific infectious or inflammatory enterocolitis. There could be a component of non inflammatory bowel wall edema such as from hypoalbuminemia given the additional findings of small volume ascites and moderate right pleural effusion. 2. Endoscopy capsule is present within a mid to distal small bowel loop in the right lower quadrant. No significant small bowel dilatation or focal small bowel caliber transition to suggest bowel obstruction. 3. Mild interval growth of peritoneal tumor implant along the posterior right liver capsule. Stable separate subcentimeter right anterior peritoneal tumor implant. No new peritoneal tumor implants. 4. Right urinary tract obstruction at the level of the proximal pelvic segment of the right ureter with severe worsening right hydroureteronephrosis. Interval removal of the right nephroureteral stent. 5. New mild splenomegaly. 6. Bilateral cystic adnexal structures, somewhat tubular, differential includes hydrosalpinges.   Electronically Signed   By: Ilona Sorrel M.D.   On: 07/25/2015 16:40    Review of Systems  Constitutional: Positive for weight loss and malaise/fatigue. Negative for fever and chills.  HENT: Negative.   Eyes: Negative.   Respiratory: Negative.   Cardiovascular: Negative.  Gastrointestinal: Positive for blood in stool.  Genitourinary: Negative.  Negative for frequency, hematuria and flank pain.  Musculoskeletal: Negative.   Skin: Negative.   Neurological: Negative.   Endo/Heme/Allergies: Negative.   Psychiatric/Behavioral: Negative.    Blood pressure 128/60, pulse 76, temperature 98.7 F (37.1 C), temperature source Oral, resp. rate 16, height _0  (1.727 m), weight 92.08 kg (203 lb), SpO2 96 %. Physical Exam   Constitutional: She appears well-developed.  HENT:  Head: Normocephalic.  Eyes: Pupils are equal, round, and reactive to light.  Neck: Normal range of motion.  Cardiovascular: Normal rate.   Respiratory: Effort normal.  GI: Soft.  Dark liquid bloody stool in ostomy bag.  Genitourinary:  No CVAT  Musculoskeletal: Normal range of motion.  Neurological: She is alert.  Skin: Skin is warm.  Psychiatric: She has a normal mood and affect. Her behavior is normal. Judgment and thought content normal.    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   She does not tolerate stent well. Her overall GFR is acceptable and she is currently not receiving any substantially nephrotoxic agents / chemo.  Discussed observation with plan to replace stent should GFR begin to declince or if re-start nephrotoxic chemo and she opts for that. This seems reasonable given overall risk / benefit and balancing quality of life with outright oncologic agressiveness.  2 - Will follow prn in house, please call with questions anytime.   MANNY, THEODORE 07/26/2015, 2:03 PM

## 2015-07-27 ENCOUNTER — Inpatient Hospital Stay (HOSPITAL_COMMUNITY): Payer: BLUE CROSS/BLUE SHIELD | Admitting: Anesthesiology

## 2015-07-27 ENCOUNTER — Inpatient Hospital Stay (HOSPITAL_COMMUNITY): Payer: BLUE CROSS/BLUE SHIELD

## 2015-07-27 ENCOUNTER — Encounter (HOSPITAL_COMMUNITY)
Admission: AD | Disposition: A | Discharge status: Home Health | Payer: Self-pay | Source: Ambulatory Visit | Attending: Internal Medicine

## 2015-07-27 HISTORY — PX: LAPAROSCOPY: SHX197

## 2015-07-27 LAB — PROTIME-INR
INR: 1.17 (ref 0.00–1.49)
Prothrombin Time: 15.1 seconds (ref 11.6–15.2)

## 2015-07-27 LAB — BASIC METABOLIC PANEL
Anion gap: 6 (ref 5–15)
Anion gap: 8 (ref 5–15)
BUN: 9 mg/dL (ref 6–20)
BUN: 9 mg/dL (ref 6–20)
CALCIUM: 7.5 mg/dL — AB (ref 8.9–10.3)
CO2: 27 mmol/L (ref 22–32)
CO2: 24 mmol/L (ref 22–32)
CREATININE: 1.06 mg/dL — AB (ref 0.44–1.00)
CREATININE: 0.98 mg/dL (ref 0.44–1.00)
Calcium: 7.2 mg/dL — ABNORMAL LOW (ref 8.9–10.3)
Chloride: 102 mmol/L (ref 101–111)
Chloride: 104 mmol/L (ref 101–111)
GFR calc non Af Amer: >60 mL/min (ref >60)
GFR, EST NON AFRICAN AMERICAN: 58 mL/min — AB (ref >60)
Glucose, Bld: 118 mg/dL — ABNORMAL HIGH (ref 65–99)
Glucose, Bld: 265 mg/dL — ABNORMAL HIGH (ref 65–99)
Potassium: 3.4 mmol/L — ABNORMAL LOW (ref 3.5–5.1)
Potassium: 3.9 mmol/L (ref 3.5–5.1)
SODIUM: 135 mmol/L (ref 135–145)
SODIUM: 136 mmol/L (ref 135–145)

## 2015-07-27 LAB — CBC WITH DIFFERENTIAL/PLATELET
BASOS ABS: 0 10*3/uL (ref 0.0–0.1)
BASOS PCT: 0 %
EOS PCT: 0 %
Eosinophils Absolute: 0 10*3/uL (ref 0.0–0.7)
HCT: 29.9 % — ABNORMAL LOW (ref 36.0–46.0)
Hemoglobin: 9.9 g/dL — ABNORMAL LOW (ref 12.0–15.0)
LYMPHS PCT: 1 %
Lymphs Abs: 0.1 10*3/uL — ABNORMAL LOW (ref 0.7–4.0)
MCH: 29.4 pg (ref 26.0–34.0)
MCHC: 33.1 g/dL (ref 30.0–36.0)
MCV: 88.7 fL (ref 78.0–100.0)
MONO ABS: 0.5 10*3/uL (ref 0.1–1.0)
MONOS PCT: 4 %
NEUTROS ABS: 11.7 10*3/uL — AB (ref 1.7–7.7)
Neutrophils Relative %: 95 %
PLATELETS: 132 10*3/uL — AB (ref 150–400)
RBC: 3.37 MIL/uL — ABNORMAL LOW (ref 3.87–5.11)
RDW: 15.6 % — AB (ref 11.5–15.5)
WBC: 12.3 10*3/uL — ABNORMAL HIGH (ref 4.0–10.5)

## 2015-07-27 LAB — CBC
HCT: 22 % — ABNORMAL LOW (ref 36.0–46.0)
HEMOGLOBIN: 7.3 g/dL — AB (ref 12.0–15.0)
MCH: 29.4 pg (ref 26.0–34.0)
MCHC: 33.2 g/dL (ref 30.0–36.0)
MCV: 88.7 fL (ref 78.0–100.0)
Platelets: 108 10*3/uL — ABNORMAL LOW (ref 150–400)
RBC: 2.48 MIL/uL — ABNORMAL LOW (ref 3.87–5.11)
RDW: 16.4 % — AB (ref 11.5–15.5)
WBC: 4 10*3/uL (ref 4.0–10.5)

## 2015-07-27 LAB — SURGICAL PCR SCREEN
MRSA, PCR: NEGATIVE
STAPHYLOCOCCUS AUREUS: NEGATIVE

## 2015-07-27 LAB — PHOSPHORUS: PHOSPHORUS: 4.6 mg/dL (ref 2.5–4.6)

## 2015-07-27 LAB — PREPARE RBC (CROSSMATCH)

## 2015-07-27 LAB — MAGNESIUM: MAGNESIUM: 1.6 mg/dL — AB (ref 1.7–2.4)

## 2015-07-27 LAB — APTT: APTT: 27 s (ref 24–37)

## 2015-07-27 SURGERY — LAPAROSCOPY, DIAGNOSTIC
Anesthesia: General | Site: Abdomen

## 2015-07-27 MED ORDER — DIPHENHYDRAMINE HCL 50 MG/ML IJ SOLN: 12.5000 mg | Freq: Four times a day (QID) | INTRAMUSCULAR | Status: DC | PRN

## 2015-07-27 MED ORDER — BUPIVACAINE-EPINEPHRINE (PF) 0.25% -1:200000 IJ SOLN
INTRAMUSCULAR | Status: AC
  Filled 2015-07-27: qty 30

## 2015-07-27 MED ORDER — NALOXONE HCL 0.4 MG/ML IJ SOLN: 0.4000 mg | INTRAMUSCULAR | Status: DC | PRN

## 2015-07-27 MED ORDER — MORPHINE SULFATE 1 MG/ML IV SOLN
INTRAVENOUS | Status: AC
  Filled 2015-07-27: qty 25

## 2015-07-27 MED ORDER — DEXAMETHASONE SODIUM PHOSPHATE 10 MG/ML IJ SOLN
INTRAMUSCULAR | Status: AC
  Filled 2015-07-27: qty 1

## 2015-07-27 MED ORDER — PROPOFOL 10 MG/ML IV BOLUS
INTRAVENOUS | Status: AC
  Filled 2015-07-27: qty 20

## 2015-07-27 MED ORDER — LACTATED RINGERS IV SOLN: INTRAVENOUS | Status: DC

## 2015-07-27 MED ORDER — NEOSTIGMINE METHYLSULFATE 10 MG/10ML IV SOLN
INTRAVENOUS | Status: DC | PRN
  Administered 2015-07-27: 4 mg via INTRAVENOUS

## 2015-07-27 MED ORDER — MIDAZOLAM HCL 5 MG/5ML IJ SOLN
INTRAMUSCULAR | Status: DC | PRN
  Administered 2015-07-27 (×2): 1 mg via INTRAVENOUS

## 2015-07-27 MED ORDER — LIDOCAINE HCL (CARDIAC) 20 MG/ML IV SOLN
INTRAVENOUS | Status: DC | PRN
  Administered 2015-07-27: 50 mg via INTRAVENOUS

## 2015-07-27 MED ORDER — CIPROFLOXACIN IN D5W 400 MG/200ML IV SOLN
INTRAVENOUS | Status: AC
  Filled 2015-07-27: qty 200

## 2015-07-27 MED ORDER — MAGNESIUM SULFATE 2 GM/50ML IV SOLN
2.0000 g | Freq: Once | INTRAVENOUS | Status: AC
  Administered 2015-07-28: 2 g via INTRAVENOUS
  Filled 2015-07-27: qty 50

## 2015-07-27 MED ORDER — ONDANSETRON HCL 4 MG/2ML IJ SOLN
INTRAMUSCULAR | Status: AC
  Filled 2015-07-27: qty 2

## 2015-07-27 MED ORDER — LACTATED RINGERS IV SOLN
INTRAVENOUS | Status: DC | PRN
  Administered 2015-07-27 (×2): via INTRAVENOUS

## 2015-07-27 MED ORDER — BUPIVACAINE-EPINEPHRINE 0.25% -1:200000 IJ SOLN
INTRAMUSCULAR | Status: DC | PRN
  Administered 2015-07-27: 28 mL

## 2015-07-27 MED ORDER — MIDAZOLAM HCL 2 MG/2ML IJ SOLN
INTRAMUSCULAR | Status: AC
  Filled 2015-07-27: qty 4

## 2015-07-27 MED ORDER — SODIUM CHLORIDE 0.9 % IV SOLN: Freq: Once | INTRAVENOUS | Status: DC

## 2015-07-27 MED ORDER — HYDROMORPHONE HCL 1 MG/ML IJ SOLN
INTRAMUSCULAR | Status: AC
  Filled 2015-07-27: qty 1

## 2015-07-27 MED ORDER — HYDROMORPHONE HCL 2 MG/ML IJ SOLN
INTRAMUSCULAR | Status: AC
  Filled 2015-07-27: qty 1

## 2015-07-27 MED ORDER — METRONIDAZOLE IN NACL 5-0.79 MG/ML-% IV SOLN
INTRAVENOUS | Status: AC
  Filled 2015-07-27: qty 100

## 2015-07-27 MED ORDER — ROCURONIUM BROMIDE 100 MG/10ML IV SOLN
INTRAVENOUS | Status: AC
  Filled 2015-07-27: qty 1

## 2015-07-27 MED ORDER — PANTOPRAZOLE SODIUM 40 MG IV SOLR
40.0000 mg | Freq: Every day | INTRAVENOUS | Status: DC
  Administered 2015-07-27 – 2015-08-02 (×7): 40 mg via INTRAVENOUS
  Filled 2015-07-27 (×8): qty 40

## 2015-07-27 MED ORDER — HYDROMORPHONE HCL 1 MG/ML IJ SOLN
0.2500 mg | INTRAMUSCULAR | Status: DC | PRN
  Administered 2015-07-27 (×3): 0.5 mg via INTRAVENOUS
  Filled 2015-07-27: qty 1

## 2015-07-27 MED ORDER — ONDANSETRON HCL 4 MG/2ML IJ SOLN
4.0000 mg | Freq: Four times a day (QID) | INTRAMUSCULAR | Status: DC | PRN
  Administered 2015-07-30 – 2015-08-03 (×6): 4 mg via INTRAVENOUS
  Filled 2015-07-27 (×6): qty 2

## 2015-07-27 MED ORDER — METRONIDAZOLE IN NACL 5-0.79 MG/ML-% IV SOLN
500.0000 mg | Freq: Three times a day (TID) | INTRAVENOUS | Status: AC
  Administered 2015-07-27 – 2015-07-28 (×2): 500 mg via INTRAVENOUS
  Filled 2015-07-27 (×2): qty 100

## 2015-07-27 MED ORDER — HYDROMORPHONE HCL 1 MG/ML IJ SOLN
INTRAMUSCULAR | Status: DC | PRN
  Administered 2015-07-27 (×2): 1 mg via INTRAVENOUS

## 2015-07-27 MED ORDER — CIPROFLOXACIN IN D5W 400 MG/200ML IV SOLN
400.0000 mg | INTRAVENOUS | Status: AC
  Administered 2015-07-27: 400 mg via INTRAVENOUS

## 2015-07-27 MED ORDER — SODIUM CHLORIDE 0.9 % IV SOLN: 250.0000 mL | INTRAVENOUS | Status: DC | PRN

## 2015-07-27 MED ORDER — LACTATED RINGERS IV SOLN
INTRAVENOUS | Status: DC | PRN
  Administered 2015-07-27: 13:00:00 via INTRAVENOUS

## 2015-07-27 MED ORDER — SODIUM CHLORIDE 0.9 % IV SOLN
INTRAVENOUS | Status: DC | PRN
  Administered 2015-07-27 (×2): via INTRAVENOUS

## 2015-07-27 MED ORDER — CETYLPYRIDINIUM CHLORIDE 0.05 % MT LIQD
7.0000 mL | Freq: Two times a day (BID) | OROMUCOSAL | Status: DC
  Administered 2015-07-27 – 2015-08-05 (×15): 7 mL via OROMUCOSAL

## 2015-07-27 MED ORDER — FENTANYL CITRATE (PF) 250 MCG/5ML IJ SOLN
INTRAMUSCULAR | Status: AC
Start: 2015-07-27 — End: 2015-07-27
  Filled 2015-07-27: qty 25

## 2015-07-27 MED ORDER — MEPERIDINE HCL 50 MG/ML IJ SOLN: 6.2500 mg | INTRAMUSCULAR | Status: DC | PRN

## 2015-07-27 MED ORDER — ROCURONIUM BROMIDE 100 MG/10ML IV SOLN
INTRAVENOUS | Status: DC | PRN
  Administered 2015-07-27: 5 mg via INTRAVENOUS
  Administered 2015-07-27: 30 mg via INTRAVENOUS
  Administered 2015-07-27: 10 mg via INTRAVENOUS

## 2015-07-27 MED ORDER — DEXTROSE-NACL 5-0.45 % IV SOLN
INTRAVENOUS | Status: DC
  Administered 2015-07-27 – 2015-07-30 (×4): via INTRAVENOUS

## 2015-07-27 MED ORDER — 0.9 % SODIUM CHLORIDE (POUR BTL) OPTIME
TOPICAL | Status: DC | PRN
  Administered 2015-07-27: 1000 mL

## 2015-07-27 MED ORDER — HYDRALAZINE HCL 20 MG/ML IJ SOLN
10.0000 mg | Freq: Four times a day (QID) | INTRAMUSCULAR | Status: DC | PRN
  Administered 2015-07-27: 10 mg via INTRAVENOUS
  Filled 2015-07-27: qty 1

## 2015-07-27 MED ORDER — CIPROFLOXACIN IN D5W 400 MG/200ML IV SOLN
400.0000 mg | Freq: Once | INTRAVENOUS | Status: AC
  Administered 2015-07-28: 400 mg via INTRAVENOUS
  Filled 2015-07-27: qty 200

## 2015-07-27 MED ORDER — ONDANSETRON HCL 4 MG/2ML IJ SOLN
INTRAMUSCULAR | Status: DC | PRN
  Administered 2015-07-27: 4 mg via INTRAVENOUS

## 2015-07-27 MED ORDER — LIDOCAINE HCL (CARDIAC) 20 MG/ML IV SOLN
INTRAVENOUS | Status: AC
  Filled 2015-07-27: qty 5

## 2015-07-27 MED ORDER — MORPHINE SULFATE 1 MG/ML IV SOLN
INTRAVENOUS | Status: DC
  Administered 2015-07-27 – 2015-07-28 (×2): 1 mg via INTRAVENOUS
  Administered 2015-07-28: 18 mg via INTRAVENOUS
  Filled 2015-07-27: qty 25

## 2015-07-27 MED ORDER — SODIUM CHLORIDE 0.9 % IJ SOLN: 9.0000 mL | INTRAMUSCULAR | Status: DC | PRN

## 2015-07-27 MED ORDER — ONDANSETRON HCL 4 MG/2ML IJ SOLN: 4.0000 mg | Freq: Four times a day (QID) | INTRAMUSCULAR | Status: DC | PRN

## 2015-07-27 MED ORDER — GLYCOPYRROLATE 0.2 MG/ML IJ SOLN
INTRAMUSCULAR | Status: DC | PRN
  Administered 2015-07-27: .4 mg via INTRAVENOUS

## 2015-07-27 MED ORDER — PROPOFOL 10 MG/ML IV BOLUS
INTRAVENOUS | Status: DC | PRN
  Administered 2015-07-27: 180 mg via INTRAVENOUS

## 2015-07-27 MED ORDER — FENTANYL CITRATE (PF) 100 MCG/2ML IJ SOLN
INTRAMUSCULAR | Status: DC | PRN
  Administered 2015-07-27 (×5): 50 ug via INTRAVENOUS

## 2015-07-27 MED ORDER — SUCCINYLCHOLINE CHLORIDE 20 MG/ML IJ SOLN
INTRAMUSCULAR | Status: DC | PRN
  Administered 2015-07-27: 100 mg via INTRAVENOUS

## 2015-07-27 MED ORDER — DEXAMETHASONE SODIUM PHOSPHATE 10 MG/ML IJ SOLN
INTRAMUSCULAR | Status: DC | PRN
  Administered 2015-07-27: 10 mg via INTRAVENOUS

## 2015-07-27 MED ORDER — DIPHENHYDRAMINE HCL 12.5 MG/5ML PO ELIX: 12.5000 mg | ORAL_SOLUTION | Freq: Four times a day (QID) | ORAL | Status: DC | PRN

## 2015-07-27 MED ORDER — PROMETHAZINE HCL 25 MG/ML IJ SOLN: 6.2500 mg | INTRAMUSCULAR | Status: DC | PRN

## 2015-07-27 MED ORDER — ACETAMINOPHEN 325 MG PO TABS: 650.0000 mg | ORAL_TABLET | ORAL | Status: DC | PRN

## 2015-07-27 MED ORDER — METRONIDAZOLE IN NACL 5-0.79 MG/ML-% IV SOLN
500.0000 mg | INTRAVENOUS | Status: AC
  Administered 2015-07-27: 500 mg via INTRAVENOUS

## 2015-07-27 SURGICAL SUPPLY — 52 items
BLADE 10 SAFETY STRL DISP (BLADE) ×3 IMPLANT
BLADE EXTENDED COATED 6.5IN (ELECTRODE) ×3 IMPLANT
COVER SURGICAL LIGHT HANDLE (MISCELLANEOUS) ×3 IMPLANT
DECANTER SPIKE VIAL GLASS SM (MISCELLANEOUS) IMPLANT
DRAIN CHANNEL 19F RND (DRAIN) ×3 IMPLANT
DRAPE LAPAROSCOPIC ABDOMINAL (DRAPES) ×3 IMPLANT
DRAPE WARM FLUID 44X44 (DRAPE) ×3 IMPLANT
DRSG OPSITE POSTOP 4X8 (GAUZE/BANDAGES/DRESSINGS) ×3 IMPLANT
ELECT PENCIL ROCKER SW 15FT (MISCELLANEOUS) ×6 IMPLANT
ELECT REM PT RETURN 9FT ADLT (ELECTROSURGICAL) ×3
ELECTRODE REM PT RTRN 9FT ADLT (ELECTROSURGICAL) ×1 IMPLANT
EVACUATOR SILICONE 100CC (DRAIN) ×3 IMPLANT
GAUZE SPONGE 2X2 8PLY STRL LF (GAUZE/BANDAGES/DRESSINGS) ×1 IMPLANT
GLOVE BIOGEL PI IND STRL 7.0 (GLOVE) ×1 IMPLANT
GLOVE BIOGEL PI INDICATOR 7.0 (GLOVE) ×2
GLOVE SURG SS PI 7.0 STRL IVOR (GLOVE) ×6 IMPLANT
GOWN STRL REUS W/TWL LRG LVL3 (GOWN DISPOSABLE) ×3 IMPLANT
GOWN STRL REUS W/TWL XL LVL3 (GOWN DISPOSABLE) ×6 IMPLANT
KIT BASIN OR (CUSTOM PROCEDURE TRAY) ×3 IMPLANT
LIGASURE IMPACT 36 18CM CVD LR (INSTRUMENTS) ×3 IMPLANT
LIQUID BAND (GAUZE/BANDAGES/DRESSINGS) IMPLANT
POUCH ENDO CATCH II 15MM (MISCELLANEOUS) ×3 IMPLANT
RELOAD PROXIMATE 75MM BLUE (ENDOMECHANICALS) ×6 IMPLANT
SET IRRIG TUBING LAPAROSCOPIC (IRRIGATION / IRRIGATOR) ×3 IMPLANT
SHEARS HARMONIC ACE PLUS 36CM (ENDOMECHANICALS) IMPLANT
SLEEVE XCEL OPT CAN 5 100 (ENDOMECHANICALS) ×3 IMPLANT
SPONGE GAUZE 2X2 STER 10/PKG (GAUZE/BANDAGES/DRESSINGS) ×2
SPONGE LAP 18X18 X RAY DECT (DISPOSABLE) ×6 IMPLANT
STAPLER GUN LINEAR PROX 60 (STAPLE) ×3 IMPLANT
STAPLER PROXIMATE 75MM BLUE (STAPLE) ×3 IMPLANT
STAPLER VISISTAT 35W (STAPLE) ×3 IMPLANT
SUCTION POOLE TIP (SUCTIONS) ×3 IMPLANT
SUT ETHILON 2 0 PS N (SUTURE) ×3 IMPLANT
SUT MNCRL AB 4-0 PS2 18 (SUTURE) ×3 IMPLANT
SUT PDS AB 0 CT1 36 (SUTURE) ×6 IMPLANT
SUT SILK 2 0 (SUTURE) ×2
SUT SILK 2 0 SH CR/8 (SUTURE) ×3 IMPLANT
SUT SILK 2 0SH CR/8 30 (SUTURE) ×3 IMPLANT
SUT SILK 2-0 18XBRD TIE 12 (SUTURE) ×1 IMPLANT
SUT SILK 3 0 (SUTURE) ×2
SUT SILK 3 0 SH CR/8 (SUTURE) ×6 IMPLANT
SUT SILK 3-0 18XBRD TIE 12 (SUTURE) ×1 IMPLANT
TAPE CLOTH SURG 4X10 WHT LF (GAUZE/BANDAGES/DRESSINGS) ×3 IMPLANT
TOWEL OR 17X26 10 PK STRL BLUE (TOWEL DISPOSABLE) ×6 IMPLANT
TOWEL OR NON WOVEN STRL DISP B (DISPOSABLE) ×3 IMPLANT
TRAY FOLEY W/METER SILVER 14FR (SET/KITS/TRAYS/PACK) ×3 IMPLANT
TRAY FOLEY W/METER SILVER 16FR (SET/KITS/TRAYS/PACK) IMPLANT
TRAY LAPAROSCOPIC (CUSTOM PROCEDURE TRAY) ×3 IMPLANT
TROCAR BLADELESS OPT 5 100 (ENDOMECHANICALS) ×3 IMPLANT
TROCAR XCEL 12X100 BLDLESS (ENDOMECHANICALS) ×3 IMPLANT
YANKAUER SUCT BULB TIP 10FT TU (MISCELLANEOUS) ×3 IMPLANT
YANKAUER SUCT BULB TIP NO VENT (SUCTIONS) ×3 IMPLANT

## 2015-07-27 NOTE — H&P (View-Only) (Signed)
Reason for Consult:GI bleed, colon cancer Referring Physician: Karcyn Menn is an 54 y.o. female.  HPI: 54 yo female with adenocarcinoma of the colon s/p en bloc resection with R colectomy, sigmoid colectomy now has been through multiple treatments of chemo and radiation, complicated by PE for which she was on coumadin until 1 month ago when she began having bleeding out her ostomy. She has undergone upper and lower endoscopy with findings of esophagitis but otherwise no culprit for this bleed. She was undergoing capsule endoscopy but the capsule has now stopped movement and there is concern for blockage. She continues to have maroon colored stools out her ostomy. She has minimal pain at this time.  She was trying to go to [facility] for evaluation for HIPEC and wants to continue aggressive treatment of this cancer as she has 2 children under her care.  Past Medical History  Diagnosis Date  . GERD (gastroesophageal reflux disease)   . Depression   . Anxiety   . 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 Peripheral neuropathy due to chemotherapy   . Hydronephrosis, right     malignant--  s/p reimiplant right ureter  . History of pulmonary embolus (PE)     11-28-2014--  due to chemotherapy on coumadin  . Anticoagulated on Coumadin   . Colorectal cancer, stage III oncologist--  dr Stefani Dama    Dx  10-10-2014---Stage IIIC, [id number], Multifocal synchronous, Moderate differentiated Invasive, Mets to nodes (5 out of 17)/  s/p  Resection tumor cecum, terminal ileum, sigmoid/  currently chemoradiation therapy  . Dysuria   . S/P radiation therapy 03/02/15    completed pelvis/abd/50.4Gy  . Allergy     SEASONAL  . Blood transfusion without reported diagnosis     Past Surgical History  Procedure Laterality Date  . Cystoscopy with retrograde pyelogram, ureteroscopy and stent placement Right 10/05/2014    Procedure: [place], URETEROSCOPY  AND  STENT PLACEMENT;  Surgeon: Alexis Frock, MD;  Location: [facility];  Service: Urology;  Laterality: Right;  . Laparotomy N/A 10/10/2014    Procedure: EXPLORATORY LAPAROTOMY;  Surgeon: Doreen Salvage, MD;  Location: [place];  Service: General;  Laterality: N/A;  . Partial colectomy  10/10/2014    Procedure: PARTIAL SIGMOID COLECTOMY;  Surgeon: Doreen Salvage, MD;  Location: [place];  Service: General;;  . Colostomy  10/10/2014    Procedure: COLOSTOMY;  Surgeon: Doreen Salvage, MD;  Location: [place];  Service: General;;  . Colostomy revision  10/10/2014    Procedure: PARTIAL RIGHT COLECTOMY;  Surgeon: Doreen Salvage, MD;  Location: [place];  Service: General;;  . Ureteral reimplantion Right 10/10/2014    Procedure: OPEN RIGHT URETERAL LYSIS ;  Surgeon: Alexis Frock, MD;  Location: [place];  Service: Urology;  Laterality: Right;  . Portacath placement N/A 10/17/2014    Procedure: INSERTION PORT-A-CATH LEFT SUBCLAVIAN AND MIDLINE INCISION STAPLE REMOVAL ;  Surgeon: Coralie Keens, MD;  Location: [place];  Service: General;  Laterality: N/A;  . Cystoscopy w/ retrogrades Right 01/10/2015    Procedure: CYSTOSCOPY WITH RETROGRADE PYELOGRAM;  Surgeon: Alexis Frock, MD;  Location: [facility];  Service: Urology;  Laterality: Right;  . Cystoscopy w/ ureteral stent placement Right 01/10/2015    Procedure: CYSTOSCOPY WITH STENT REPLACEMENT;  Surgeon: Alexis Frock, MD;  Location: [facility];  Service: Urology;  Laterality: Right;  . Colonoscopy    . Enteroscopy N/A 07/16/2015    Procedure: ENTEROSCOPY;  Surgeon: Gatha Mayer, MD;  Location: [facility]COPY;  Service: Endoscopy;  Laterality: N/A;  . [place] capsule study N/A 07/22/2015    Procedure: GIVENS CAPSULE STUDY;  Surgeon: Manus Gunning, MD;  Location: [facility];  Service: Gastroenterology;  Laterality: N/A;    Family History  Problem Relation Age of Onset  . Cancer Mother     skin cancer   . Diabetes Father   . CAD Father     Onset in  his 5s, has 7 stents  . Heart disease Father   . Cancer Maternal Grandmother 20    colon cancer   . Breast cancer Maternal Grandmother   . Colon cancer Maternal Grandmother   . Cancer Paternal Grandmother 91    breast cancer   . Breast cancer Paternal Grandmother     Social History:  reports that she has never smoked. She has never used smokeless tobacco. She reports that she does not drink alcohol or use illicit drugs.  Allergies:  Allergies  Allergen Reactions  . Amoxicillin Other (See Comments)    Yeast infection    Medications: I have reviewed the patient's current medications.  Results for orders placed or performed during the hospital encounter of 07/19/15 (from the past 48 hour(s))  CBC     Status: Abnormal   Collection Time: 07/24/15  9:38 AM  Result Value Ref Range   WBC 3.7 (L) 4.0 - 10.5 K/uL   RBC 2.67 (L) 3.87 - 5.11 MIL/uL   Hemoglobin 7.8 (L) 12.0 - 15.0 g/dL    Comment: REPEATED TO VERIFY   HCT 23.5 (L) 36.0 - 46.0 %   MCV 88.0 78.0 - 100.0 fL   MCH 29.2 26.0 - 34.0 pg   MCHC 33.2 30.0 - 36.0 g/dL   RDW 16.8 (H) 11.5 - 15.5 %   Platelets 101 (L) 150 - 400 K/uL    Comment: SPECIMEN CHECKED FOR CLOTS REPEATED TO VERIFY CONSISTENT WITH PREVIOUS RESULT   CBC     Status: Abnormal   Collection Time: 07/25/15  6:25 AM  Result Value Ref Range   WBC 3.9 (L) 4.0 - 10.5 K/uL   RBC 2.30 (L) 3.87 - 5.11 MIL/uL   Hemoglobin 6.7 (LL) 12.0 - 15.0 g/dL    Comment: REPEATED TO VERIFY CRITICAL RESULT CALLED TO, READ BACK BY AND VERIFIED WITH: M. MILLS RN AT [phone] ON 09.21.16 BY SHUEA    HCT 20.5 (L) 36.0 - 46.0 %   MCV 89.1 78.0 - 100.0 fL   MCH 29.1 26.0 - 34.0 pg   MCHC 32.7 30.0 - 36.0 g/dL   RDW 17.0 (H) 11.5 - 15.5 %   Platelets 94 (L) 150 - 400 K/uL    Comment: CONSISTENT WITH PREVIOUS RESULT  Basic metabolic panel     Status: Abnormal   Collection Time: 07/25/15  6:25 AM  Result Value Ref Range   Sodium 134 (L) 135 - 145 mmol/L   Potassium 3.4 (L) 3.5  - 5.1 mmol/L   Chloride 101 101 - 111 mmol/L   CO2 26 22 - 32 mmol/L   Glucose, Bld 102 (H) 65 - 99 mg/dL   BUN 10 6 - 20 mg/dL   Creatinine, Ser 1.16 (H) 0.44 - 1.00 mg/dL   Calcium 7.4 (L) 8.9 - 10.3 mg/dL   GFR calc non Af Amer 52 (L) >60 mL/min   GFR calc Af Amer >60 >60 mL/min    Comment: (NOTE) The eGFR has been calculated using the CKD  EPI equation. This calculation has not been validated in all clinical situations. eGFR's persistently <60 mL/min signify possible Chronic Kidney Disease.    Anion gap 7 5 - 15  Prepare RBC     Status: None   Collection Time: 07/25/15  7:00 AM  Result Value Ref Range   Order Confirmation ORDER PROCESSED BY BLOOD BANK   Type and screen     Status: None (Preliminary result)   Collection Time: 07/25/15  7:50 AM  Result Value Ref Range   ABO/RH(D) A POS    Antibody Screen NEG    Sample Expiration 07/28/2015    Unit Number [id number]    Blood Component Type RED CELLS,LR    Unit division 00    Status of Unit ALLOCATED    Transfusion Status OK TO TRANSFUSE    Crossmatch Result Compatible     Dg Abd 1 View  07/24/2015   CLINICAL DATA:  Generalized abdominal pain, ingested endoscopy capsule yesterday.  EXAM: ABDOMEN - 1 VIEW  COMPARISON:  None.  FINDINGS: The bowel gas pattern is normal. Endoscopy capsule is seen in left lower quadrant of abdomen. Stool is noted in the rectum. No abnormal calcifications are noted.  IMPRESSION: No evidence of bowel obstruction or ileus. Endoscopy capsule seen in left lower quadrant of abdomen.   Electronically Signed   By: Marijo Conception, M.D.   On: 07/24/2015 11:21   Nm Bowel Img Meckels  07/24/2015   CLINICAL DATA:  54 year old with 2 month history of GI bleeding. History of colon cancer and colostomy.  EXAM: NUCLEAR MEDICINE MECKEL'S SCAN  TECHNIQUE: Sequential abdominal images were obtained following intravenous injection of radiopharmaceutical.  RADIOPHARMACEUTICALS:  9.98 mCi technetium 61mpertechnetate  intravenously.  COMPARISON:  Nuclear medicine bleeding study 07/20/2015 and abdominal CT 03/27/2015.  FINDINGS: No abnormal activity is identified within the gastrointestinal tract. There is normal activity within the stomach and bladder.  IMPRESSION: Negative Meckel's scan.  There is no abnormal GI uptake.   Electronically Signed   By: WRichardean SaleM.D.   On: 07/24/2015 09:16    Review of Systems  Constitutional: Positive for malaise/fatigue. Negative for fever and chills.  HENT: Negative for hearing loss and tinnitus.   Eyes: Negative for photophobia and pain.  Respiratory: Negative for hemoptysis and sputum production.   Cardiovascular: Negative for palpitations and orthopnea.  Gastrointestinal: Positive for abdominal pain and blood in stool. Negative for nausea and vomiting.  Genitourinary: Negative for dysuria and urgency.  Musculoskeletal: Negative for joint pain and neck pain.  Skin: Negative for itching and rash.  Neurological: Negative for tingling and tremors.  Endo/Heme/Allergies: Negative for environmental allergies. Does not bruise/bleed easily.  Psychiatric/Behavioral: Negative for hallucinations. The patient is nervous/anxious.    Blood pressure 120/53, pulse 78, temperature 98.8 F (37.1 C), temperature source Oral, resp. rate 16, height 5' 8"  (1.727 m), weight 92.08 kg (203 lb), SpO2 97 %. Physical Exam  Constitutional: She is oriented to person, place, and time. She appears well-developed and well-nourished.  HENT:  Head: Normocephalic and atraumatic.  Eyes: Pupils are equal, round, and reactive to light.  Neck: Normal range of motion. Neck supple.  Respiratory: Effort normal and breath sounds normal.  GI: Soft. She exhibits no distension. There is no tenderness.  Musculoskeletal: She exhibits edema and tenderness.  Neurological: She is alert and oriented to person, place, and time.  Skin: Skin is warm and dry.  Psychiatric: She has a normal mood and affect. Her  behavior is  normal.    Assessment/Plan: 54 yo female with advanced adenocarcinoma with signs of carcinomatosis on last imaging now with acute GI bleed unable to identify location, endoscopy does not show recurrence at anastomosis or ostomy site. -I will discuss the case with Dr. Hulen Skains  -surgical attempt at this point will likely have multiple complications so I will be discussing options with the rest of the team.  Arta Bruce Kinsinger 07/25/2015, 10:07 AM

## 2015-07-27 NOTE — Transfer of Care (Signed)
Immediate Anesthesia Transfer of Care Note  Patient: Allison Mitchell  Procedure(s) Performed: Procedure(s): LAPAROSCOPY DIAGNOSTIC WITH EXPLORATORY LAPAROTOMY, SMALL BOWEL RESECTION (N/A)  Patient Location: PACU  Anesthesia Type:General  Level of Consciousness: awake, oriented, patient cooperative, lethargic and responds to stimulation  Airway & Oxygen Therapy: Patient Spontanous Breathing and Patient connected to face mask oxygen  Post-op Assessment: Report given to RN, Post -op Vital signs reviewed and stable and Patient moving all extremities  Post vital signs: Reviewed and stable  Last Vitals:  Filed Vitals:   07/27/15 1200  BP: 125/99  Pulse: 83  Temp: 36.8 C  Resp:     Complications: No apparent anesthesia complications

## 2015-07-27 NOTE — Anesthesia Preprocedure Evaluation (Signed)
Anesthesia Evaluation  Patient identified by MRN, date of birth, ID band Patient awake    Reviewed: Allergy & Precautions, H&P , Patient's Chart, lab work & pertinent test results, reviewed documented beta blocker date and time   Airway Mallampati: II  TM Distance: >3 FB Neck ROM: full    Dental no notable dental hx.    Pulmonary    Pulmonary exam normal breath sounds clear to auscultation       Cardiovascular  Rhythm:regular Rate:Normal     Neuro/Psych    GI/Hepatic   Endo/Other    Renal/GU      Musculoskeletal   Abdominal   Peds  Hematology   Anesthesia Other Findings GERD (gastroesophageal reflux disease)   Depression    Anxiety    Anemia .... 7.3 today Thrombocytopenia Peripheral neuropathy due to chemotherapy  Hydronephrosis, right malignant-- s/p reimiplant right ureter   History of pulmonary embolus (PE)  11-28-2014-- due to chemotherapy on coumadin  Anticoagulated on Coumadin   Dysuria    S/P radiation therapy 03/02/15  Blood transfusion without reported diagnosis    Colorectal          Reproductive/Obstetrics                             Anesthesia Physical Anesthesia Plan  ASA: II  Anesthesia Plan: General   Post-op Pain Management:    Induction: Intravenous  Airway Management Planned: Oral ETT  Additional Equipment:   Intra-op Plan:   Post-operative Plan: Extubation in OR  Informed Consent: I have reviewed the patients History and Physical, chart, labs and discussed the procedure including the risks, benefits and alternatives for the proposed anesthesia with the patient or authorized representative who has indicated his/her understanding and acceptance.   Dental Advisory Given and Dental advisory given  Plan Discussed with: CRNA and Surgeon  Anesthesia Plan Comments: (  Discussed general anesthesia, including possible nausea, instrumentation of  airway, sore throat,pulmonary aspiration, etc. I asked if the were any outstanding questions, or  concerns before we proceeded. )        Anesthesia Quick Evaluation

## 2015-07-27 NOTE — Progress Notes (Signed)
PROGRESS NOTE  Allison Mitchell FUX:[id number] DOB: 12-Jan-1961 DOA: 07/19/2015 PCP: [name], MD   HPI: Allison Mitchell is a 54 y.o white female who presents to the unit following several weeks of maroon-colored watery stool in her ostomy bag. Recent endoscopy (upper and lower) per Dr. Burr Medico unable to reveal source. Patient admitted for further workup of GI bleed. Initally laboratory revealed a hemoglobin of 7.4, which required a transfusion. Throughout her hospital course thus far, multiple transfusions have been required due to decreasing hemoglobin. RBC scan, capsule study, and Meckel's unable to find etiology. PMH includes stage III colon cancer s/p right hemicolectomy on 10/10/14 by Dr. Hulen Skains.   Subjective / 24 H Interval events Today the patient states that she "felt awful this morning". She reports nausea and 1x emesis (non-bloody) today. She expresses increased abdominal cramping (suprapubic). She only had to drain ostomy bag x2 in the last 24 hours, but reports that it is still the same (watery, maroon colored). Still has not passed capsule. Denies fever or chills. Denies fatigue, dizziness, or weakness.   Assessment/Plan: Principal Problem:   Acute blood loss anemia Active Problems:   Colorectal cancer, stage III   Hypokalemia   Symptomatic anemia   Bleeding gastrointestinal   Occult GI bleeding   Foreign body in digestive system   CKD (chronic kidney disease) stage 2, GFR 60-89 ml/min   Metastatic colon cancer to liver   Hydronephrosis   Acute blood loss anemia due to occult GI bleeding - Ongoing, without clear source, continues to require blood transfusions (s/p 2U pRBC 9/15, 2U 9/16 and 2U 9/21). She remains asymptomatic of current anemia.  - Negative workup thus far (RBC scan, Capsule study, Meckel's study, CT-Enterogram) - Recently had an EGD and a lower endoscopy this month without acute findings - GI Oncology, General surgery following- concerned for possible  malignancy at location of retained capsule  - Patient to undergo laparoscopy vs exploratory laparotomy today with Dr. Kieth Brightly  Right hydroureteronephrosis - This is acute on chronic - + Flank tenderness, but no urinary symptoms  -  Dr. Tresa Moore consulted, will observe for now, consider stent replacement if GFR declines or patient starts nephrotoxic chemo again   Stage III Colon Cancer  - S/p right hemicolectomy with current colostomy.  - Managed at the cancer center w/ chemo and radiation therapy, currently being considered for cytoreductive surgery and HIPEC - Oncology following, for now chemo is on hold  CKD stage II - Cr overall stable, continue to monitor  Chemotherapy induced pancytopenia - Remains stable, closely monitor platelets/WBC   Hypokalemia - Suspected to be secondary to increased GI loss with vomiting this AM - Monitor and replete as needed  Hyponatremia - WNL today. Continue to monitor.    Diet: NPO Fluids: NS DVT Prophylaxis: SCDs  Code Status: Full Code Family Communication: None at bedside  Disposition Plan: Home when medically stable   Consultants:  GI   IR   Oncology   Surgery   Urology   Procedures:  RBC scan   Capsule study   Meckel scan   Antibiotics:  None  Studies:  Ct Entero Abd/pelvis W/cm  07/25/2015   CLINICAL DATA:  54 year old female with a history of cecal colon cancer diagnosed in December 2015 status post resection with end colostomy, status post radiation therapy and ongoing chemotherapy, with peritoneal carcinomatosis, presenting with GI bleeding, with a suspected small bowel source, with suspected retention of endoscopy capsule in the distal small bowel. Negative Meckel's scan  and negative tagged red blood cell scintigraphy study.  EXAM: CT ABDOMEN AND PELVIS WITH CONTRAST (ENTEROGRAPHY)  TECHNIQUE: Multidetector CT of the abdomen and pelvis during bolus administration of intravenous contrast. Negative oral  contrast VoLumen was given.  CONTRAST:  143mL OMNIPAQUE IOHEXOL 300 MG/ML  SOLN  COMPARISON:  03/27/2015 CT of the abdomen and pelvis. 05/03/2015 MRI abdomen.  FINDINGS: Lower chest: Moderate layering right pleural effusion with associated passive dependent right lower lobe atelectasis.  Hepatobiliary: There is a 0.5 cm hypodense lesion in segment 7 of the right lower lobe (series 2/ image 13), characterized as a simple liver cyst on the 05/03/2015 MRI study. There is a heterogeneously enhancing 2.2 x 1.7 cm peritoneal tumor implant along the right posterior liver capsule (2/24), increased from 2.1 x 1.3 cm on 05/03/2015. No new tumor implants are noted adjacent to the liver. Normal gallbladder, with no radiopaque cholelithiasis. No biliary ductal dilatation.  Pancreas: Normal, with no mass or duct dilation.  Spleen: New mild splenomegaly (maximum axial splenic dimension 16.8 cm, previously 12.9 cm). No splenic mass.  Adrenals/Urinary Tract: Normal adrenals. There has been interval removal of the right nephroureteral stent. There is severe right hydroureteronephrosis to the level of the proximal pelvic segment of the right ureter, worsened since 03/27/2015. There is an asymmetrically delayed right contrast nephrogram indicating right urinary tract obstruction. No left hydronephrosis. Normal caliber left ureter. No renal mass. Collapsed and grossly normal urinary bladder.  Stomach/Bowel: Mildly distended fluid-filled stomach. Normal caliber small bowel. There is prominent circumferential wall thickening continuously involving the small bowel from the level of the mid small bowel to the neo terminal ileum in the right abdomen. The patient is status post ileocecal ectomy with ileocolic anastomosis in the right abdomen. There is an and colostomy in the left ventral abdominal wall. There is severe circumferential wall thickening and mucosal hyper enhancement of the entire residual colon, which is collapsed. The endoscopy  capsule is present within a mid to distal small bowel loop in the right lower quadrant (series 3/ image 100). The remnant rectum is collapsed and unremarkable.  Vascular/Lymphatic: Atherosclerotic nonaneurysmal abdominal aorta. Patent portal, splenic, hepatic and renal veins. No abdominopelvic lymphadenopathy.  Reproductive: Grossly normal uterus. There is a somewhat tubular cystic 4.2 x 2.2 cm right adnexal cystic structure (2/60), previously 3.8 x 2.4 cm, not appreciably changed. There is a cystic 2.5 x 2.1 cm left adnexal cystic structure (2/66), which appears new. No gas or significant wall thickening associated with these adnexal cystic structures.  Other: The previously described 0.9 x 0.6 cm right anterior peritoneal implant measures 0.8 x 0.6 cm, not appreciably changed (2/41). No new peritoneal tumor implants are detected. New small volume ascites. Focal fluid collection adjacent to the colostomy stoma in the ventral left abdominal wall likely represents trapped ascitic fluid. No pneumoperitoneum.  Musculoskeletal: No aggressive appearing focal osseous lesions. Minimal degenerative changes in the visualized thoracolumbar spine.  IMPRESSION: 1. Prominent continuous circumferential wall thickening from the mid small bowel to the neo-terminal ileum. Prominent circumferential wall thickening of the entire residual colon. These findings are most suggestive of a nonspecific infectious or inflammatory enterocolitis. There could be a component of non inflammatory bowel wall edema such as from hypoalbuminemia given the additional findings of small volume ascites and moderate right pleural effusion. 2. Endoscopy capsule is present within a mid to distal small bowel loop in the right lower quadrant. No significant small bowel dilatation or focal small bowel caliber transition to suggest bowel obstruction.  3. Mild interval growth of peritoneal tumor implant along the posterior right liver capsule. Stable separate  subcentimeter right anterior peritoneal tumor implant. No new peritoneal tumor implants. 4. Right urinary tract obstruction at the level of the proximal pelvic segment of the right ureter with severe worsening right hydroureteronephrosis. Interval removal of the right nephroureteral stent. 5. New mild splenomegaly. 6. Bilateral cystic adnexal structures, somewhat tubular, differential includes hydrosalpinges.   Electronically Signed   By: Ilona Sorrel M.D.   On: 07/25/2015 16:40    Objective  Filed Vitals:   07/25/15 2118 07/26/15 0459 07/26/15 2225 07/27/15 0543  BP: 146/69 128/60 142/61 124/61  Pulse: 104 76 81 81  Temp: 98.7 F (37.1 C) 98.7 F (37.1 C) 98.6 F (37 C) 97.8 F (36.6 C)  TempSrc: Oral Oral Oral Oral  Resp: 16 16 20 16   Height:      Weight:      SpO2: 95% 96% 98% 94%    Intake/Output Summary (Last 24 hours) at 07/27/15 1014 Last data filed at 07/27/15 0200  Gross per 24 hour  Intake    240 ml  Output      0 ml  Net    240 ml   Filed Weights   07/22/15 1433  Weight: 92.08 kg (203 lb)    Exam:  GENERAL: Well-appearing woman, sitting up in bed, no acute distress   HEENT: head NCAT, no scleral icterus. Mucous membranes are moist.  NECK: Supple. No lymphadenopathy or thyromegaly.  LUNGS: Clear to auscultation. No wheezing or crackles  HEART: Regular rate and rhythm with mild systolic ejection murmur at LUSB. No JVD, no peripheral edema  ABDOMEN: Soft, nontender, and nondistended. Positive bowel sounds. + R sided flank pain. Colostomy bag in LLQ contains moderate amount of watery, maroon contents   EXTREMITIES: Without any cyanosis, clubbing, rash, lesions or edema.  NEUROLOGIC: Alert and oriented x3. Appropriate movement of upper and lower extremities   PSYCHIATRIC: Flattened affect today, mood is slightly down  SKIN: No ulceration or induration present.     Data Reviewed: Basic Metabolic Panel:  Last Labs        Liver Function Tests:  Last  Labs        CBC:  Last Labs           Recent Results (from the past 240 hour(s))  TECHNOLOGIST REVIEW     Status: None   Collection Time: 07/19/15  8:01 AM  Result Value Ref Range Status   Technologist Review rare rbc fragment present  Final  MRSA PCR Screening     Status: None   Collection Time: 07/19/15  2:22 PM  Result Value Ref Range Status   MRSA by PCR NEGATIVE NEGATIVE Final    Comment:        The GeneXpert MRSA Assay (FDA approved for NASAL specimens only), is one component of a comprehensive MRSA colonization surveillance program. It is not intended to diagnose MRSA infection nor to guide or monitor treatment for MRSA infections.      Scheduled Meds: . sodium chloride   Intravenous Once  . sodium chloride   Intravenous Once  . citalopram  20 mg Oral q morning - 10a  . pantoprazole (PROTONIX) IV  40 mg Intravenous Q12H  . potassium chloride SA  20 mEq Oral Daily  . sodium chloride  3 mL Intravenous Q12H  . sodium chloride  3 mL Intravenous Q12H     Micayla Zeltman, Student-PA  Marzetta Board, MD [facility] Pager [phone]. If 7 PM - 7 AM, please contact night-coverage at [email or web address], password [facility] 07/27/2015, 10:14 AM  LOS: 8 days

## 2015-07-27 NOTE — Anesthesia Procedure Notes (Signed)
Procedure Name: Intubation Date/Time: 07/27/2015 1:11 PM Performed by: Noralyn Pick D Pre-anesthesia Checklist: Patient identified, Emergency Drugs available, Suction available and Patient being monitored Patient Re-evaluated:Patient Re-evaluated prior to inductionOxygen Delivery Method: Circle System Utilized Preoxygenation: Pre-oxygenation with 100% oxygen Intubation Type: IV induction Ventilation: Mask ventilation without difficulty Laryngoscope Size: Mac and 3 Grade View: Grade II Tube type: Oral Tube size: 7.5 mm Number of attempts: 1 Airway Equipment and Method: Stylet and Oral airway Placement Confirmation: ETT inserted through vocal cords under direct vision,  positive ETCO2 and breath sounds checked- equal and bilateral Secured at: 21 cm Tube secured with: Tape Dental Injury: Teeth and Oropharynx as per pre-operative assessment

## 2015-07-27 NOTE — Progress Notes (Signed)
5 Days Post-Op  Subjective: She still feels bad, no one thing, she just doesn't feel good.  No abdominal pain.  Ostomy is still draining bloody fluid, with clots.  Objective: Vital signs in last 24 hours: Temp:  [97.8 F (36.6 C)-98.6 F (37 C)] 97.8 F (36.6 C) (09/23 0543) Pulse Rate:  [81] 81 (09/23 0543) Resp:  [16-20] 16 (09/23 0543) BP: (124-142)/(61) 124/61 mmHg (09/23 0543) SpO2:  [94 %-98 %] 94 % (09/23 0543) Last BM Date: 07/26/15 240 Po recorded, nothing else recorded Afebrile, VSS H/h still low Intake/Output from previous day: 09/22 0701 - 09/23 0700 In: 240 [P.O.:240] Out: -  Intake/Output this shift:    General appearance: alert, cooperative, no distress and tired, just feels bad. Resp: clear to auscultation bilaterally GI: soft, non-tender; bowel sounds normal; no masses,  no organomegaly and ostomy is draining bloody appearing fluid with what looks like clots in bag .  Lab Results:   Recent Labs  07/26/15 0540 07/27/15 0545  WBC 3.9* 4.0  HGB 7.6* 7.3*  HCT 23.2* 22.0*  PLT 107* 108*    BMET  Recent Labs  07/26/15 0540 07/27/15 0545  NA 135 135  K 3.6 3.4*  CL 103 102  CO2 26 27  GLUCOSE 99 118*  BUN 7 9  CREATININE 1.09* 1.06*  CALCIUM 7.5* 7.5*   PT/INR No results for input(s): LABPROT, INR in the last 72 hours.   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 Negative Meckel's scan and negative tagged red blood  cell scintigraphy study.  EXAM: CT ABDOMEN AND PELVIS WITH CONTRAST (ENTEROGRAPHY)  TECHNIQUE: Multidetector CT of the abdomen and pelvis during bolus administration of intravenous contrast. Negative oral contrast VoLumen was given.  CONTRAST:  185mL OMNIPAQUE IOHEXOL 300 MG/ML  SOLN  COMPARISON:  03/27/2015 CT of the abdomen and pelvis. 05/03/2015 MRI abdomen.  FINDINGS: Lower chest: Moderate layering right pleural effusion with associated passive dependent right lower lobe atelectasis.  Hepatobiliary: There is a 0.5 cm hypodense lesion in segment 7 of the right lower lobe (series 2/ image 13), characterized as a simple liver cyst on the 05/03/2015 MRI study. There is a heterogeneously enhancing 2.2 x 1.7 cm peritoneal tumor implant along the right posterior liver capsule (2/24), increased from 2.1 x 1.3 cm on 05/03/2015. No new tumor implants are noted adjacent to the liver. Normal gallbladder, with no radiopaque cholelithiasis. No biliary ductal dilatation.  Pancreas: Normal, with no mass or duct dilation.  Spleen: New mild splenomegaly (maximum axial splenic dimension 16.8 cm, previously 12.9 cm). No splenic mass.  Adrenals/Urinary Tract: Normal adrenals. There has been interval removal of the right nephroureteral stent. There is severe right hydroureteronephrosis to the level of the proximal pelvic segment of the right ureter, worsened since 03/27/2015. There is an asymmetrically delayed right contrast nephrogram indicating right urinary tract obstruction. No left hydronephrosis. Normal caliber left ureter. No renal mass. Collapsed and grossly normal urinary bladder.  Stomach/Bowel: Mildly distended  fluid-filled stomach. Normal caliber small bowel. There is prominent circumferential wall thickening continuously involving the small bowel from the level of the mid small bowel to the neo terminal ileum in the right abdomen. The patient is status post ileocecal ectomy with ileocolic anastomosis in the right  abdomen. There is an and colostomy in the left ventral abdominal wall. There is severe circumferential wall thickening and mucosal hyper enhancement of the entire residual colon, which is collapsed. The endoscopy capsule is present within a mid to distal small bowel loop in the right lower quadrant (series 3/ image 100). The remnant rectum is collapsed and unremarkable.  Vascular/Lymphatic: Atherosclerotic nonaneurysmal abdominal aorta. Patent portal, splenic, hepatic and renal veins. No abdominopelvic lymphadenopathy.  Reproductive: Grossly normal uterus. There is a somewhat tubular cystic 4.2 x 2.2 cm right adnexal cystic structure (2/60), previously 3.8 x 2.4 cm, not appreciably changed. There is a cystic 2.5 x 2.1 cm left adnexal cystic structure (2/66), which appears new. No gas or significant wall thickening associated with these adnexal cystic structures.  Other: The previously described 0.9 x 0.6 cm right anterior peritoneal implant measures 0.8 x 0.6 cm, not appreciably changed (2/41). No new peritoneal tumor implants are detected. New small volume ascites. Focal fluid collection adjacent to the colostomy stoma in the ventral left abdominal wall likely represents trapped ascitic fluid. No pneumoperitoneum.  Musculoskeletal: No aggressive appearing focal osseous lesions. Minimal degenerative changes in the visualized thoracolumbar spine.  IMPRESSION: 1. Prominent continuous circumferential wall thickening from the mid small bowel to the neo-terminal ileum. Prominent circumferential wall thickening of the entire residual colon. These findings are most suggestive of a nonspecific infectious or inflammatory enterocolitis. There could be a component of non inflammatory bowel wall edema such as from hypoalbuminemia given the additional findings of small volume ascites and moderate right pleural effusion. 2. Endoscopy capsule is present within a mid to distal small bowel loop in the right lower quadrant. No  significant small bowel dilatation or focal small bowel caliber transition to suggest bowel obstruction. 3. Mild interval growth of peritoneal tumor implant along the posterior right liver capsule. Stable separate subcentimeter right anterior peritoneal tumor implant. No new peritoneal tumor implants. 4. Right urinary tract obstruction at the level of the proximal pelvic segment of the right ureter with severe worsening right hydroureteronephrosis. Interval removal of the right nephroureteral stent. 5. New mild splenomegaly. 6. Bilateral cystic adnexal structures, somewhat tubular, differential includes hydrosalpinges.   Electronically Signed   By: Ilona Sorrel M.D.   On: 07/25/2015 16:40    Medications: . sodium chloride   Intravenous Once  . sodium chloride   Intravenous Once  . citalopram  20 mg Oral q morning - 10a  . pantoprazole (PROTONIX) IV  40 mg Intravenous Q12H  . potassium chloride SA  20 mEq Oral Daily  . sodium chloride  3 mL Intravenous Q12H  . sodium chloride  3 mL Intravenous Q12H    Assessment/Plan GI bleeding Probable carcinomatosis on CT scan  Stage III colon cancer (adenocarcinoma) S/p en bloc resection with right colectomy, sigmoid colectomy, colostomy revision, , ureteral reimplantation on the right, 12 /08/15, Dr. Doreen Salvage Chemotherapy and radiation therapy Anemia and neuropathy associated with chemotherapy Hx of PE Anxiety/Depression GERD Antibiotics: None DVT: No heparin secondary to bleeding   Plan:  Dr. Kieth Brightly is aware she still has blood coming from ostomy.   She has a type and screen in lab, I don't see coagulation studies.  We have made her  NPO and will discuss with care team.      LOS: 8 days    JENNINGS,WILLARD 07/27/2015

## 2015-07-27 NOTE — Progress Notes (Signed)
[facility] Progress Note Patient Name: Allison Mitchell DOB: 12-20-1960 MRN: [id number]   Date of Service  07/27/2015  HPI/Events of Note  Magnesium level = 1.6 and Creatinine = 0.98.  eICU Interventions  Will replete Magnesium.     Intervention Category Intermediate Interventions: Electrolyte abnormality - evaluation and management  Sommer,Steven Eugene 07/27/2015, 11:48 PM

## 2015-07-27 NOTE — Progress Notes (Signed)
eLink Physician-Brief Progress Note Patient Name: Allison Mitchell DOB: 1961-03-12 MRN: [id number]   Date of Service  07/27/2015  HPI/Events of Note  S/p SB resection & anastomosis, extubated, hemodynamically stable stg 3 colon CA hydronephrosis  eICU Interventions  Await post op labs Keep on hospitalist service     Intervention Category Evaluation Type: New Patient Evaluation  ALVA,RAKESH V. 07/27/2015, 7:35 PM

## 2015-07-27 NOTE — Interval H&P Note (Signed)
History and Physical Interval Note:  Allison Mitchell continues to have bleeding and now has begun vomiting with concern for obstruction 2/2 capsule. I discussed the options and do not think she will improve with nonsurgical management. She agreed and we have decided to proceed with exploration for obstruction/bleeding.  07/27/2015 12:35 PM  Allison Mitchell  has presented today for surgery, with the diagnosis of foreign body endoscopy capsule, advanced carcinoma with bleeding  The various methods of treatment have been discussed with the patient and family. After consideration of risks, benefits and other options for treatment, the patient has consented to  Procedure(s): LAPAROSCOPY DIAGNOSTIC POSSIBLE EXPLORATORY LAPAROTOMY (N/A) as a surgical intervention .  The patient's history has been reviewed, patient examined, no change in status, stable for surgery.  I have reviewed the patient's chart and labs.  Questions were answered to the patient's satisfaction.     Arta Bruce Kinsinger

## 2015-07-27 NOTE — Op Note (Signed)
Preoperative diagnosis: GI bleed, small bowel obstruction, colon cancer   Postoperative diagnosis: GI bleed, small   Procedure: lysis of adhesions, small bowel resection with anastomosis  Surgeon: Gurney Maxin, M.D.  Asst: Fanny Skates, M.D.  Anesthesia: General   Indications for procedure: Allison Mitchell is a 54 year old female with history of adenocarcinoma who underwent sigmoid resection right colon resection and small bowel resection in December 2015 for advanced cancer. Since then she has undergone radiation therapy as well as FOLFOX chemotherapy. This was complicated by a pulmonary embolus and she had a put on Coumadin. The last 2 weeks she started bleeding out of her colostomy was brought into the hospital underwent multiple blood transfusions and was not able to stop bleeding. Upper and lower endoscopy was negative for any findings of widowed bleeding was coming from the attempted capsule endoscopy however the capsule that stuck in the middle of the small bowel. Because of this getting stuck and because of the continued bleeding we decided to proceed with exploratory laparotomy for control and removal of obstruction.  Description of procedure: Patient was brought into the operative suite placed supine anesthesia was administered with endotracheal tube. Patient was prepped and draped in usual sterile fashion. Timeout was successful.  Quarter percent Marcaine with epinephrine was infused in the left subcostal space transverse incision was made and a 5 mm trocar was introduced under optical vision into the peritoneal cavity. Once inside the peritoneal cavity wearable see some small adhesions from mostly filmy in the midline and the omentum was stuck up against the abdominal wall transverse colon stuck up the up against the upper abdominal wall but this appeared to be above the area that we suspected to be working and based off the CT findings. Her Foley decided to convert to laparotomy and midline  incision was made through the previous scar. Cautery was used to dissect down to its obtains tissues and enter the fascia. Next the omentum was bluntly and sharply dissected off of the anterior peritoneum and then we'll set up the [place] for appropriate retraction. Small bowel was edematous and dilated in the lower abdomen. We were able to find proximal small bowel which appeared normal nonedematous and not dilated in appearance traced this down radial on multiple adhesive adhesions to bowel loops to the pelvis as well as anterior loop adhesions. With blunt dissection were able to bring the bowel out of the pelvis. And we encountered the rectal stump which was densely adhesed to small bowel. We remove the rectal cuff in order to free up the small bowel and then sewed the rectal stump back closed with 3-0 silk interrupted sutures. We continued our dissection of the small bowel freeing it off of the sacral wall. We were eventually able to trace down to the ileocolostomy from previous surgery. And there were multiple interloop adhesions and kinking within the bowel. The dissected further to free some of these up and were left with approximately 3 feet of bowel stuck in multiple interloop adhesions which did not seem worth dissecting given the disease of the bowel.  Therefore we decided to resect these 3 feet of bowel and nose are made within the mesentery at the lateral parts and then 75 mm Endo GIA was used to transect the small bowel in both proximal and distal locations. The LigaSure impact was used to plicate ligate the mesentery of the re-resected bowel and then a small bowel anastomosis was made with a 75 mm Endo GIA stapler and the mesenteric defect was  closed with a 60 mm TA stapler. Specimen was sent to pathology. Mesenteric defect was closed with figure of 8 3-0 silk sutures we then ran the bowel from our most proximal area down to the ileocolostomy there were 3 areas of serosal tear less than 50% bowel  circumference these were closed with 3-0 silk Lembert sutures. Once this was complete we irrigated out the abdomen and ensured hemostasis was intact placed a 19 French drain into right lower quadrant to drain the pelvis. Then closed the abdomen with 0 PDS in running fashion. Skin was then closed with skin staples the ostomy was reopened and dressings are placed patient awoke from anesthesia and was prepped PACU with plans to be admitted to the critical care unit for continued monitoring.  Findings: Small bowel obstruction with multiple interloop adhesions.  Specimen: small bowel resection. History of adenocarcinoma  Blood loss: 500cc  Local anesthesia: 10cc 1.85% marcaine  Complications: none  Gurney Maxin, M.D. General, Bariatric, & Minimally Invasive Surgery [facility]

## 2015-07-28 MED ORDER — MAGIC MOUTHWASH
15.0000 mL | Freq: Four times a day (QID) | ORAL | Status: DC | PRN
  Administered 2015-07-30: 15 mL via ORAL
  Filled 2015-07-28 (×2): qty 15

## 2015-07-28 MED ORDER — METOPROLOL TARTRATE 1 MG/ML IV SOLN
5.0000 mg | Freq: Four times a day (QID) | INTRAVENOUS | Status: DC | PRN
  Filled 2015-07-28: qty 5

## 2015-07-28 MED ORDER — LIP MEDEX EX OINT
1.0000 "application " | TOPICAL_OINTMENT | Freq: Two times a day (BID) | CUTANEOUS | Status: DC
  Administered 2015-07-28 – 2015-08-05 (×16): 1 via TOPICAL
  Filled 2015-07-28 (×2): qty 7

## 2015-07-28 MED ORDER — HYDROMORPHONE 0.3 MG/ML IV SOLN
INTRAVENOUS | Status: DC
  Administered 2015-07-28: 10:00:00 via INTRAVENOUS
  Administered 2015-07-28 – 2015-07-29 (×3): 0.6 mg via INTRAVENOUS
  Administered 2015-07-29: 0.9 mg via INTRAVENOUS
  Administered 2015-07-29: 0.6 mg via INTRAVENOUS
  Administered 2015-07-30 (×2): 0 mg via INTRAVENOUS
  Filled 2015-07-28: qty 25

## 2015-07-28 MED ORDER — HYDROMORPHONE HCL 1 MG/ML IJ SOLN
0.5000 mg | INTRAMUSCULAR | Status: DC | PRN
Start: 2015-07-28 — End: 2015-07-30

## 2015-07-28 MED ORDER — ALUM & MAG HYDROXIDE-SIMETH 200-200-20 MG/5ML PO SUSP: 30.0000 mL | Freq: Four times a day (QID) | ORAL | Status: DC | PRN

## 2015-07-28 MED ORDER — LACTATED RINGERS IV BOLUS (SEPSIS): 1000.0000 mL | Freq: Three times a day (TID) | INTRAVENOUS | Status: AC | PRN

## 2015-07-28 MED ORDER — MENTHOL 3 MG MT LOZG
1.0000 | LOZENGE | OROMUCOSAL | Status: DC | PRN
  Filled 2015-07-28: qty 9

## 2015-07-28 MED ORDER — NALOXONE HCL 0.4 MG/ML IJ SOLN: 0.4000 mg | INTRAMUSCULAR | Status: DC | PRN

## 2015-07-28 MED ORDER — PHENOL 1.4 % MT LIQD
2.0000 | OROMUCOSAL | Status: DC | PRN
  Filled 2015-07-28: qty 177

## 2015-07-28 MED ORDER — ACETAMINOPHEN 650 MG RE SUPP: 650.0000 mg | Freq: Four times a day (QID) | RECTAL | Status: DC | PRN

## 2015-07-28 MED ORDER — SODIUM CHLORIDE 0.9 % IJ SOLN: 9.0000 mL | INTRAMUSCULAR | Status: DC | PRN

## 2015-07-28 NOTE — Progress Notes (Signed)
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  Procedure: lysis of adhesions, small bowel resection with anastomosis  Surgeon: Gurney Maxin, M.D.  Asst: Fanny Skates, M.D.  Assessment  Stabilizing  Plan:  -improve pain control - change to Dilaudid -HTN control - metoprolol, hydralzine, and enalapril PRN -NGT for ileus & surgery for SBO -f/u path -transfer to SDU -VTE prophylaxis- SCDs, etc -mobilize as tolerated to help recovery  [name], M.D., F.A.C.S. Gastrointestinal and Minimally Invasive Surgery [facility] [address], [place], [place] [address] [phone] Main / Paging   07/28/2015  Subjective:  Sore Using PCA - very sleepy w it but in pain w HTN when awake In bed  Objective:  Vital signs:  Filed Vitals:   07/28/15 0355 07/28/15 0400 07/28/15 0500 07/28/15 0600  BP:  168/72 147/72 149/90  Pulse:  94 94 96  Temp: 97.8 F (36.6 C)     TempSrc: Oral     Resp:  8 9 9   Height:      Weight: 94.7 kg (208 lb 12.4 oz)     SpO2:  96% 95% 95%    Last BM Date: 07/27/15  Intake/Output   Yesterday:  09/23 0701 - 09/24 0700 In: 5420 [I.V.:4100; Blood:870; IV  Piggyback:450] Out: 8563 [Urine:660; Drains:310; Blood:400] This shift:  Total I/O In: 1497 [I.V.:1200; IV Piggyback:450] Out: 540 [Urine:450; Drains:90]  Bowel function:  Flatus: n  BM: n  Drain: serosanguinous  Physical Exam:  General: Pt sleepy but awakens oriented x2 in mild acute distress Eyes: PERRL, normal EOM.  Sclera clear.  No icterus Neuro: CN II-XII intact w/o focal sensory/motor deficits. Lymph: No head/neck/groin lymphadenopathy Psych:  No psychosis/paranoia.  Not agitated HENT: Normocephalic, Mucus membranes moist.  No thrush.  NGT in place Neck: Supple, No tracheal deviation Chest: No chest wall pain w good excursion CV:  Pulses intact.  Regular rhythm MS: Normal AROM mjr joints.  No obvious deformity Abdomen: Soft.  Nondistended.  Dressings c/d/i.   Mildly tender at incisions only.  No evidence of peritonitis.  No incarcerated hernias. Ext:  SCDs BLE.  No mjr edema.  No cyanosis Skin: No petechiae / purpura  Results:   Labs: Results for orders placed or performed during the hospital encounter of 07/19/15 (from the past 48 hour(s))  CBC     Status: Abnormal   Collection Time: 07/27/15  5:45 AM  Result Value Ref Range   WBC 4.0 4.0 - 10.5 K/uL   RBC 2.48 (L) 3.87 - 5.11 MIL/uL   Hemoglobin 7.3 (L) 12.0 - 15.0 g/dL   HCT 22.0 (L) 36.0 - 46.0 %   MCV 88.7 78.0 - 100.0 fL   MCH 29.4 26.0 - 34.0 pg   MCHC 33.2 30.0 - 36.0  g/dL   RDW 16.4 (H) 11.5 - 15.5 %   Platelets 108 (L) 150 - 400 K/uL    Comment: CONSISTENT WITH PREVIOUS RESULT  Basic metabolic panel     Status: Abnormal   Collection Time: 07/27/15  5:45 AM  Result Value Ref Range   Sodium 135 135 - 145 mmol/L   Potassium 3.4 (L) 3.5 - 5.1 mmol/L   Chloride 102 101 - 111 mmol/L   CO2 27 22 - 32 mmol/L   Glucose, Bld 118 (H) 65 - 99 mg/dL   BUN 9 6 - 20 mg/dL   Creatinine, Ser 1.06 (H) 0.44 - 1.00 mg/dL   Calcium 7.5 (L) 8.9 - 10.3 mg/dL   GFR calc non Af Amer 58 (L) >60 mL/min   GFR calc Af  Amer >60 >60 mL/min    Comment: (NOTE) The eGFR has been calculated using the CKD EPI equation. This calculation has not been validated in all clinical situations. eGFR's persistently <60 mL/min signify possible Chronic Kidney Disease.    Anion gap 6 5 - 15  Surgical pcr screen     Status: None   Collection Time: 07/27/15 12:06 PM  Result Value Ref Range   MRSA, PCR NEGATIVE NEGATIVE   Staphylococcus aureus NEGATIVE NEGATIVE    Comment:        The Xpert SA Assay (FDA approved for NASAL specimens in patients over 41 years of age), is one component of a comprehensive surveillance program.  Test performance has been validated by [facility] for patients greater than or equal to 48 year old. It is not intended to diagnose infection nor to guide or monitor treatment.   Prepare RBC     Status: None   Collection Time: 07/27/15 12:54 PM  Result Value Ref Range   Order Confirmation ORDER PROCESSED BY BLOOD BANK   Prepare Pheresed Platelets     Status: None (Preliminary result)   Collection Time: 07/27/15  1:30 PM  Result Value Ref Range   Unit Number [id number]    Blood Component Type PLTPHER LI2    Unit division 00    Status of Unit ALLOCATED    Transfusion Status OK TO TRANSFUSE   Prepare fresh frozen plasma     Status: None (Preliminary result)   Collection Time: 07/27/15  3:00 PM  Result Value Ref Range   Unit Number [id number]    Blood Component Type THW PLS APHR    Unit division 00    Status of Unit ISSUED    Transfusion Status OK TO TRANSFUSE   Prepare fresh frozen plasma     Status: None (Preliminary result)   Collection Time: 07/27/15  4:30 PM  Result Value Ref Range   Unit Number [id number]    Blood Component Type THW PLS APHR    Unit division B0    Status of Unit ALLOCATED    Transfusion Status OK TO TRANSFUSE   Basic metabolic panel     Status: Abnormal   Collection Time: 07/27/15  9:07 PM  Result Value Ref Range   Sodium 136 135 - 145 mmol/L    Potassium 3.9 3.5 - 5.1 mmol/L   Chloride 104 101 - 111 mmol/L   CO2 24 22 - 32 mmol/L   Glucose, Bld 265 (H) 65 - 99 mg/dL   BUN 9 6 - 20 mg/dL   Creatinine, Ser 0.98 0.44 - 1.00 mg/dL   Calcium 7.2 (L) 8.9 - 10.3 mg/dL   GFR calc non Af Amer >  60 >60 mL/min   GFR calc Af Amer >60 >60 mL/min    Comment: (NOTE) The eGFR has been calculated using the CKD EPI equation. This calculation has not been validated in all clinical situations. eGFR's persistently <60 mL/min signify possible Chronic Kidney Disease.    Anion gap 8 5 - 15  Magnesium     Status: Abnormal   Collection Time: 07/27/15  9:07 PM  Result Value Ref Range   Magnesium 1.6 (L) 1.7 - 2.4 mg/dL  Phosphorus     Status: None   Collection Time: 07/27/15  9:07 PM  Result Value Ref Range   Phosphorus 4.6 2.5 - 4.6 mg/dL  CBC WITH DIFFERENTIAL     Status: Abnormal   Collection Time: 07/27/15  9:07 PM  Result Value Ref Range   WBC 12.3 (H) 4.0 - 10.5 K/uL   RBC 3.37 (L) 3.87 - 5.11 MIL/uL   Hemoglobin 9.9 (L) 12.0 - 15.0 g/dL    Comment: DELTA CHECK NOTED REPEATED TO VERIFY POST TRANSFUSION SPECIMEN    HCT 29.9 (L) 36.0 - 46.0 %   MCV 88.7 78.0 - 100.0 fL   MCH 29.4 26.0 - 34.0 pg   MCHC 33.1 30.0 - 36.0 g/dL   RDW 15.6 (H) 11.5 - 15.5 %   Platelets 132 (L) 150 - 400 K/uL   Neutrophils Relative % 95 %   Neutro Abs 11.7 (H) 1.7 - 7.7 K/uL   Lymphocytes Relative 1 %   Lymphs Abs 0.1 (L) 0.7 - 4.0 K/uL   Monocytes Relative 4 %   Monocytes Absolute 0.5 0.1 - 1.0 K/uL   Eosinophils Relative 0 %   Eosinophils Absolute 0.0 0.0 - 0.7 K/uL   Basophils Relative 0 %   Basophils Absolute 0.0 0.0 - 0.1 K/uL  Protime-INR     Status: None   Collection Time: 07/27/15  9:07 PM  Result Value Ref Range   Prothrombin Time 15.1 11.6 - 15.2 seconds   INR 1.17 0.00 - 1.49  APTT     Status: None   Collection Time: 07/27/15  9:07 PM  Result Value Ref Range   aPTT 27 24 - 37 seconds    Imaging / Studies: Dg Abd 2  Views  07/27/2015   CLINICAL DATA:  Post op-bowel obstruction  Best obtainable images  EXAM: ABDOMEN - 2 VIEW  COMPARISON:  CT of the abdomen and pelvis 07/25/2015  FINDINGS: Surgical clips overlie the midline of the abdomen. Surgical drain is identified in the pelvis. Bowel gas pattern is nonobstructed. Orogastric tube tip overlies the level of the proximal stomach. Bowel sutures are identified in the right lower quadrant of the abdomen. Small right pleural effusion is noted.  IMPRESSION: 1. Postoperative changes. 2. Nonobstructive bowel gas pattern. 3. Right pleural effusion.   Electronically Signed   By: Nolon Nations M.D.   On: 07/27/2015 18:27   Dg Abd Portable 1v  07/27/2015   CLINICAL DATA:  Lifting for endoscopic capsule.  EXAM: PORTABLE ABDOMEN - 1 VIEW  COMPARISON:  None.  FINDINGS: Resected small bowel segment is imaged. Suture lines are noted. No radiopaque endoscopic capsule visualized.  IMPRESSION: No radiopaque endoscopic capsule visualized.   Electronically Signed   By: Rolm Baptise M.D.   On: 07/27/2015 15:27    Medications / Allergies: per chart  Antibiotics: Anti-infectives    Start     Dose/Rate Route Frequency Ordered Stop   07/27/15 2330  ciprofloxacin (CIPRO) IVPB 400 mg  400 mg 200 mL/hr over 60 Minutes Intravenous  Once 07/27/15 1746 07/28/15 0116   07/27/15 2100  metroNIDAZOLE (FLAGYL) IVPB 500 mg     500 mg 100 mL/hr over 60 Minutes Intravenous Every 8 hours 07/27/15 1746 07/28/15 0532   07/27/15 1300  metroNIDAZOLE (FLAGYL) IVPB 500 mg     500 mg 100 mL/hr over 60 Minutes Intravenous On call to O.R. 07/27/15 1153 07/27/15 1313   07/27/15 1300  ciprofloxacin (CIPRO) IVPB 400 mg     400 mg 200 mL/hr over 60 Minutes Intravenous On call to O.R. 07/27/15 1153 07/27/15 1329        Note: Portions of this report may have been transcribed using voice recognition software. Every effort was made to ensure accuracy; however, inadvertent computerized transcription  errors may be present.   Any transcriptional errors that result from this process are unintentional.     [name], M.D., F.A.C.S. Gastrointestinal and Minimally Invasive Surgery [facility] [address] [place], [place] [address] [phone] Main / Paging   07/28/2015  CARE TEAM:  PCP: [name], MD  Outpatient Care Team: Patient Care Team: [name], MD as PCP - General (Family Medicine) Alexis Frock, MD as Consulting Physician (Urology) Sheilah Mins, MD as Referring Physician (Surgical Oncology) Gatha Mayer, MD as Consulting Physician (Gastroenterology) Truitt Merle, MD as Consulting Physician (Oncology) Judeth Horn, MD as Consulting Physician (General Surgery) Md Ccs, MD as Consulting Physician (General Surgery)  Inpatient Treatment Team: Treatment Team: Attending Provider: Caren Griffins, MD; Technician: Lonia Blood, NT; Consulting Physician: Truitt Merle, MD; Registered Nurse: Charlesetta Ivory, RN; Technician: Ileene Rubens, NT; Registered Nurse: Dorice Lamas, RN; Rounding Team: Threasa Beards, MD; Consulting Physician: Nolon Nations, MD; Consulting Physician: Alexis Frock, MD; Registered Nurse: Yolanda Bonine, RN; Registered Nurse: Sheron Nightingale, RN; Rounding Team: Md Pccm, MD; Registered Nurse: Trula Ore, RN

## 2015-07-28 NOTE — Progress Notes (Signed)
PROGRESS NOTE  Allison Mitchell KWI:[id number] DOB: Jul 21, 1961 DOA: 07/19/2015 PCP: [name], MD  HPI: Allison Mitchell is a 54 y.o white female who presents to the unit following several weeks of maroon-colored watery stool in her ostomy bag. Recent endoscopy (upper and lower) per Dr. Burr Medico unable to reveal source. Patient admitted for further workup of GI bleed. Initally laboratory revealed a hemoglobin of 7.4, which required a transfusion. Throughout her hospital course thus far, multiple transfusions have been required due to decreasing hemoglobin. RBC scan, capsule study, and Meckel's unable to find etiology. PMH includes stage III colon cancer s/p right hemicolectomy on 10/10/14 by Dr. Hulen Skains.   Patient continued to have bleeding requiring several transfusions and she underwent a capsule study as well, which resulted in capsule being stuck. 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/2014   Hydronephrosis of right kidney with h/o metastatic cancer near ureter   Acute blood loss anemia due to occult GI bleeding in the setting of SBO s/p LOA and SB resection on 9/23 - patient required significant amounts of blood transfusions, 8 U fo far (s/p 2U pRBC 9/15, 2U 9/16 and 2U 9/21 and 2U 9/23), platelets on 9/23 and FFP x 2 on 9/23 intraop - Negative workup prior to OR (RBC scan, Capsule study, Meckel's study, CT-Enterogram) - Recently had an EGD and a lower endoscopy this month without acute findings - s/p LOA and SB resection  9/23, extensive surgery requiring ICU/SDU for now - NG tube, pain control - keep in SDU for now  Right hydroureteronephrosis - This is acute on chronic - + Flank tenderness, but no urinary symptoms  - Dr. Tresa Moore consulted, will observe for now, consider stent replacement if GFR declines or patient starts nephrotoxic chemo again  - closely monitor renal function, improving today   HTN - more hypertensive post op likely related to pain  Stage III Colon Cancer  - S/p right hemicolectomy with current colostomy.  - Managed at the cancer center w/ chemo and radiation therapy, currently being considered for cytoreductive surgery and HIPEC - Oncology following, for now chemo is on hold  CKD stage II - Cr overall stable, continue to monitor  Chemotherapy induced pancytopenia - Remains stable, closely monitor platelets/WBC  - increased leukocytosis post op, likely reactive, Hb and Plt improved after transfusions  Hypokalemia - resolved  Hyponatremia - resolved   Diet: Diet NPO time specified Except for: Ice Chips Fluids: NS DVT Prophylaxis: SCD  Code Status: Full Code Family Communication: no family bedside  Disposition Plan: remain in SDU  Consultants:  GI   IR   Oncology   Surgery   Urology  Procedures:  RBC scan   Capsule study   Meckel scan   LOA & SB resection 07/27/2015   Antibiotics  Anti-infectives    Start     Dose/Rate Route Frequency Ordered Stop   07/27/15 2330  ciprofloxacin (CIPRO) IVPB 400 mg     400 mg 200 mL/hr over 60 Minutes Intravenous  Once 07/27/15 1746 07/28/15 0116   07/27/15 2100  metroNIDAZOLE (FLAGYL) IVPB 500 mg     500 mg 100 mL/hr over 60 Minutes Intravenous Every 8 hours 07/27/15 1746 07/28/15 0532   07/27/15 1300  metroNIDAZOLE (FLAGYL) IVPB 500 mg     500 mg 100 mL/hr over 60 Minutes Intravenous On call to O.R. 07/27/15 1153 07/27/15 1313   07/27/15 1300  ciprofloxacin (CIPRO) IVPB 400 mg     400 mg 200  mL/hr over 60 Minutes Intravenous On call to O.R. 07/27/15 1153 07/27/15 1329       Studies  Dg Abd 2 Views  07/27/2015   CLINICAL DATA:  Post op-bowel obstruction  Best obtainable images  EXAM: ABDOMEN - 2 VIEW  COMPARISON:  CT of the abdomen and pelvis 07/25/2015  FINDINGS: Surgical clips overlie the midline of the abdomen. Surgical drain is identified in the pelvis. Bowel gas pattern is nonobstructed. Orogastric tube tip overlies the level of the proximal stomach. Bowel sutures are identified in the right lower quadrant of the abdomen. Small right pleural effusion is noted.  IMPRESSION: 1. Postoperative changes. 2. Nonobstructive bowel gas pattern. 3. Right pleural effusion.   Electronically Signed   By: Nolon Nations M.D.   On: 07/27/2015 18:27   Dg Abd Portable 1v  07/27/2015   CLINICAL DATA:  Lifting for endoscopic capsule.  EXAM: PORTABLE ABDOMEN - 1 VIEW  COMPARISON:  None.  FINDINGS: Resected small bowel segment is imaged. Suture lines are noted. No radiopaque endoscopic capsule visualized.  IMPRESSION: No radiopaque endoscopic capsule visualized.   Electronically Signed   By: Rolm Baptise M.D.   On: 07/27/2015 15:27    Objective  Filed Vitals:   07/28/15 0400 07/28/15 0500 07/28/15 0600 07/28/15 0800  BP: 168/72 147/72 149/90 160/68  Pulse: 94 94 96 90  Temp:    97.9 F (36.6 C)  TempSrc:    Oral  Resp: 8 9 9 15   Height:      Weight:      SpO2: 96% 95% 95% 98%    Intake/Output Summary (Last 24 hours) at 07/28/15 1145 Last data filed at 07/28/15 0910  Gross per 24 hour  Intake   5660 ml  Output   1670 ml  Net   3990 ml   Filed Weights   07/22/15 1433 07/27/15 1755 07/28/15 0355  Weight: 92.08 kg (203 lb) 93.9 kg (207 lb 0.2 oz) 94.7 kg (208 lb 12.4 oz)    Exam:   GENERAL: drowsy, lying in bed   HEENT: No scleral icterus. Mucous membranes are moist.  NECK: Supple.  LUNGS: Clear to auscultation on anterior lung fields. No wheezing or crackles  HEART:  Regular rate and rhythm with 2/6 systolic ejection murmur at LUSB. No JVD, no peripheral edema  ABDOMEN: incision with clean dressing, no bleeding. Colostomy bag LLQ. Drain with serosanguineous fluid  EXTREMITIES: Without any cyanosis, clubbing, rash, lesions or edema.  NEUROLOGIC: non focal   SKIN: No ulceration or induration present.  Data Reviewed: Basic Metabolic Panel:  Recent Labs Lab 07/25/15 0625 07/26/15 0540 07/27/15 0545 07/27/15 2107  NA 134* 135 135 136  K 3.4* 3.6 3.4* 3.9  CL 101 103 102 104  CO2 26 26 27 24   GLUCOSE 102* 99 118* 265*  BUN 10 7 9 9   CREATININE 1.16* 1.09* 1.06* 0.98  CALCIUM 7.4* 7.5* 7.5* 7.2*  MG  --   --   --  1.6*  PHOS  --   --   --  4.6   Liver Function Tests:  Recent Labs Lab 07/26/15 0540  AST 17  ALT 8*  ALKPHOS 151*  BILITOT 0.6  PROT 4.6*  ALBUMIN 2.1*   CBC:  Recent Labs Lab 07/23/15 0453  07/25/15 0625 07/25/15 2140 07/26/15 0540 07/27/15 0545 07/27/15 2107  WBC 3.9*  < > 3.9* 5.5 3.9* 4.0 12.3*  NEUTROABS 3.1  --   --   --   --   --  11.7*  HGB 7.8*  < > 6.7* 8.3* 7.6* 7.3* 9.9*  HCT 23.9*  < > 20.5* 25.0* 23.2* 22.0* 29.9*  MCV 89.2  < > 89.1 88.7 89.2 88.7 88.7  PLT 78*  < > 94* 109* 107* 108* 132*  < > = values in this interval not displayed.   Recent Results (from the past 240 hour(s))  TECHNOLOGIST REVIEW     Status: None   Collection Time: 07/19/15  8:01 AM  Result Value Ref Range Status   Technologist Review rare rbc fragment present  Final  MRSA PCR Screening     Status: None   Collection Time: 07/19/15  2:22 PM  Result Value Ref Range Status   MRSA by PCR NEGATIVE NEGATIVE Final    Comment:        The GeneXpert MRSA Assay (FDA approved for NASAL specimens only), is one component of a comprehensive MRSA colonization surveillance program. It is not intended to diagnose MRSA infection nor to guide or monitor treatment for MRSA infections.   Surgical pcr screen     Status: None    Collection Time: 07/27/15 12:06 PM  Result Value Ref Range Status   MRSA, PCR NEGATIVE NEGATIVE Final   Staphylococcus aureus NEGATIVE NEGATIVE Final    Comment:        The Xpert SA Assay (FDA approved for NASAL specimens in patients over 80 years of age), is one component of a comprehensive surveillance program.  Test performance has been validated by [facility] for patients greater than or equal to 76 year old. It is not intended to diagnose infection nor to guide or monitor treatment.      Scheduled Meds: . antiseptic oral rinse  7 mL Mouth Rinse BID  . HYDROmorphone PCA 0.3 mg/mL   Intravenous 6 times per day  . lip balm  1 application Topical BID  . pantoprazole (PROTONIX) IV  40 mg Intravenous QHS   Continuous Infusions: . dextrose 5 % and 0.45% NaCl 75 mL/hr at 07/28/15 0957    Marzetta Board, MD [name] Pager [phone]. If 7 PM - 7 AM, please contact night-coverage at [email or web address], password [facility] 07/28/2015, 11:45 AM  LOS: 9 days

## 2015-07-28 NOTE — Anesthesia Postprocedure Evaluation (Signed)
  Anesthesia Post-op Note  Patient: Allison Mitchell  Procedure(s) Performed: Procedure(s): LAPAROSCOPY DIAGNOSTIC WITH EXPLORATORY LAPAROTOMY, SMALL BOWEL RESECTION (N/A)  Patient Location: PACU  Anesthesia Type:General  Level of Consciousness: awake and alert   Airway and Oxygen Therapy: Patient Spontanous Breathing  Post-op Pain: moderate  Post-op Assessment: Post-op Vital signs reviewed and Patient's Cardiovascular Status Stable              Post-op Vital Signs: Reviewed and stable  Last Vitals:  Filed Vitals:   07/27/15 2103  BP: 183/97  Pulse:   Temp:   Resp:     Complications: No apparent anesthesia complications

## 2015-07-29 DIAGNOSIS — Z933 Colostomy status: Secondary | ICD-10-CM

## 2015-07-29 LAB — CBC
HCT: 28 % — ABNORMAL LOW (ref 36.0–46.0)
Hemoglobin: 9 g/dL — ABNORMAL LOW (ref 12.0–15.0)
MCH: 29 pg (ref 26.0–34.0)
MCHC: 32.1 g/dL (ref 30.0–36.0)
MCV: 90.3 fL (ref 78.0–100.0)
PLATELETS: 201 10*3/uL (ref 150–400)
RBC: 3.1 MIL/uL — ABNORMAL LOW (ref 3.87–5.11)
RDW: 16.3 % — AB (ref 11.5–15.5)
WBC: 14 10*3/uL — ABNORMAL HIGH (ref 4.0–10.5)

## 2015-07-29 LAB — COMPREHENSIVE METABOLIC PANEL
ALBUMIN: 2.2 g/dL — AB (ref 3.5–5.0)
ALK PHOS: 113 U/L (ref 38–126)
ALT: 9 U/L — ABNORMAL LOW (ref 14–54)
ANION GAP: 7 (ref 5–15)
AST: 19 U/L (ref 15–41)
BILIRUBIN TOTAL: 0.4 mg/dL (ref 0.3–1.2)
BUN: 15 mg/dL (ref 6–20)
CALCIUM: 7.7 mg/dL — AB (ref 8.9–10.3)
CO2: 25 mmol/L (ref 22–32)
Chloride: 103 mmol/L (ref 101–111)
Creatinine, Ser: 1.37 mg/dL — ABNORMAL HIGH (ref 0.44–1.00)
GFR calc Af Amer: 50 mL/min — ABNORMAL LOW (ref >60)
GFR, EST NON AFRICAN AMERICAN: 43 mL/min — AB (ref >60)
GLUCOSE: 157 mg/dL — AB (ref 65–99)
Potassium: 4.5 mmol/L (ref 3.5–5.1)
Sodium: 135 mmol/L (ref 135–145)
TOTAL PROTEIN: 5.1 g/dL — AB (ref 6.5–8.1)

## 2015-07-29 MED ORDER — ENALAPRILAT 1.25 MG/ML IV SOLN
0.6250 mg | Freq: Four times a day (QID) | INTRAVENOUS | Status: DC | PRN
Start: 2015-07-29 — End: 2015-08-03
  Filled 2015-07-29: qty 1

## 2015-07-29 MED ORDER — SODIUM CHLORIDE 0.9 % IJ SOLN
10.0000 mL | INTRAMUSCULAR | Status: DC | PRN
  Administered 2015-07-30 – 2015-08-05 (×5): 10 mL
  Filled 2015-07-29 (×5): qty 40

## 2015-07-29 MED ORDER — LACTATED RINGERS IV BOLUS (SEPSIS)
1000.0000 mL | Freq: Once | INTRAVENOUS | Status: AC
  Administered 2015-07-29: 1000 mL via INTRAVENOUS

## 2015-07-29 MED ORDER — METOPROLOL TARTRATE 1 MG/ML IV SOLN
5.0000 mg | Freq: Four times a day (QID) | INTRAVENOUS | Status: DC
  Administered 2015-07-29 – 2015-08-02 (×12): 5 mg via INTRAVENOUS
  Filled 2015-07-29 (×20): qty 5

## 2015-07-29 NOTE — Progress Notes (Signed)
PROGRESS NOTE  Poppy Mcafee FAO:[id number] DOB: Jan 30, 1961 DOA: 07/19/2015 PCP: [name], MD  HPI: Nafisah Runions is a 54 y.o white female who presents to the unit following several weeks of maroon-colored watery stool in her ostomy bag. Recent endoscopy (upper and lower) per Dr. Burr Medico unable to reveal source. Patient admitted for further workup of GI bleed. Initally laboratory revealed a hemoglobin of 7.4, which required a transfusion. Throughout her hospital course thus far, multiple transfusions have been required due to decreasing hemoglobin. RBC scan, capsule study, and Meckel's unable to find etiology. PMH includes stage III colon cancer s/p right hemicolectomy on 10/10/14 by Dr. Hulen Skains.   Patient continued to have bleeding requiring several transfusions and she underwent a capsule study as well, which resulted in capsule being stuck. Patient was eventually taken to the OR on 9/23 for exlap  Subjective / 24 H Interval events - doesn't remember much about last 1-2 days - endorses abdominal pain this morning but overall comfortable.   Assessment/Plan: Principal Problem:   SBO (small bowel obstruction) s/p LOA & SB resection 07/27/2015 Active Problems:   Hypokalemia   Symptomatic anemia   Occult GI bleeding   Foreign body in digestive system   CKD (chronic kidney disease) stage 2, GFR 60-89 ml/min   Cecal colon cancer s/o colectomy/colostomy 10/10/2014   Hydronephrosis of right kidney with h/o metastatic cancer near ureter   Acute blood loss anemia due to occult GI bleeding in the setting of SBO s/p LOA and SB resection on 9/23 - patient required significant amounts of blood transfusions, 8 U fo far (s/p 2U pRBC 9/15, 2U 9/16 and 2U 9/21 and 2U 9/23), platelets on 9/23 and FFP x 2 on 9/23 intraop - Negative workup prior to OR (RBC scan, Capsule study, Meckel's study, CT-Enterogram) - Recently had an EGD and a lower endoscopy this month without acute findings - s/p LOA and SB  resection 9/23, extensive surgery requiring ICU/SDU for now - NG tube, pain control, mobilize  - Hb with mild decrease but likely a component of ABLA  Right hydroureteronephrosis - This is acute on chronic - + Flank tenderness, but no urinary symptoms  -Dr. Tresa Moore consulted, will observe for now, consider stent replacement if GFR declines or patient starts nephrotoxic chemo again  - mild increase in Cr, increase fluids however if worsening may need to re-evaluate with renal US. Monitor UOP  HTN - more hypertensive post op likely related to pain - PRNs available. She is NPO  Stage III Colon Cancer  - S/p right hemicolectomy with current colostomy.  - Managed at the cancer center w/ chemo and radiation therapy, currently being considered for cytoreductive surgery and HIPEC - Oncology following, for now chemo is on hold  CKD stage II - Cr overall stable, continue to monitor  Chemotherapy induced pancytopenia - Remains stable, closely monitor platelets/WBC  - increased leukocytosis post op, likely reactive, Hb and Plt improved after transfusions  Hypokalemia - resolved  Hyponatremia - resolved   Diet: Diet NPO time specified Except for: Ice Chips Fluids: NS DVT Prophylaxis: SCD  Code Status: Full Code Family Communication: no family bedside  Disposition Plan: remain in SDU  Consultants:  GI   IR   Oncology   Surgery   Urology  Procedures:  RBC scan   Capsule study   Meckel scan   LOA & SB resection 07/27/2015   Antibiotics  Anti-infectives    Start     Dose/Rate Route Frequency Ordered Stop  07/27/15 2330  ciprofloxacin (CIPRO) IVPB 400 mg     400 mg 200 mL/hr over 60 Minutes Intravenous  Once 07/27/15 1746 07/28/15 0116   07/27/15 2100  metroNIDAZOLE (FLAGYL) IVPB 500 mg     500 mg 100 mL/hr over 60 Minutes Intravenous Every 8 hours 07/27/15 1746 07/28/15 0532   07/27/15 1300  metroNIDAZOLE (FLAGYL) IVPB 500 mg     500 mg 100 mL/hr  over 60 Minutes Intravenous On call to O.R. 07/27/15 1153 07/27/15 1313   07/27/15 1300  ciprofloxacin (CIPRO) IVPB 400 mg     400 mg 200 mL/hr over 60 Minutes Intravenous On call to O.R. 07/27/15 1153 07/27/15 1329       Studies  Dg Abd 2 Views  07/27/2015   CLINICAL DATA:  Post op-bowel obstruction  Best obtainable images  EXAM: ABDOMEN - 2 VIEW  COMPARISON:  CT of the abdomen and pelvis 07/25/2015  FINDINGS: Surgical clips overlie the midline of the abdomen. Surgical drain is identified in the pelvis. Bowel gas pattern is nonobstructed. Orogastric tube tip overlies the level of the proximal stomach. Bowel sutures are identified in the right lower quadrant of the abdomen. Small right pleural effusion is noted.  IMPRESSION: 1. Postoperative changes. 2. Nonobstructive bowel gas pattern. 3. Right pleural effusion.   Electronically Signed   By: Nolon Nations M.D.   On: 07/27/2015 18:27   Dg Abd Portable 1v  07/27/2015   CLINICAL DATA:  Lifting for endoscopic capsule.  EXAM: PORTABLE ABDOMEN - 1 VIEW  COMPARISON:  None.  FINDINGS: Resected small bowel segment is imaged. Suture lines are noted. No radiopaque endoscopic capsule visualized.  IMPRESSION: No radiopaque endoscopic capsule visualized.   Electronically Signed   By: Rolm Baptise M.D.   On: 07/27/2015 15:27    Objective  Filed Vitals:   07/29/15 0300 07/29/15 0400 07/29/15 0500 07/29/15 0600  BP: 171/67 167/72 169/58 166/68  Pulse: 86 86 87 85  Temp:  98 F (36.7 C)    TempSrc:      Resp: 8 8 8 8   Height:      Weight:      SpO2: 99% 96% 97% 97%    Intake/Output Summary (Last 24 hours) at 07/29/15 0711 Last data filed at 07/29/15 0600  Gross per 24 hour  Intake   1810 ml  Output   2140 ml  Net   -330 ml   Filed Weights   07/22/15 1433 07/27/15 1755 07/28/15 0355  Weight: 92.08 kg (203 lb) 93.9 kg (207 lb 0.2 oz) 94.7 kg (208 lb 12.4 oz)   Exam:  GENERAL: drowsy, lying in bed. Pale  HEENT: No scleral icterus.  Mucous membranes are moist.  NECK: Supple.  LUNGS: Clear to auscultation on anterior lung fields. No wheezing or crackles  HEART: Regular rate and rhythm with 2/6 systolic ejection murmur at LUSB. No JVD, no peripheral edema  ABDOMEN: incision with clean dressing, no bleeding. Colostomy bag LLQ, empty. Drain with serosanguineous fluid  EXTREMITIES: Without any cyanosis, clubbing, rash, lesions or edema.  NEUROLOGIC: non focal   SKIN: No ulceration or induration present.  Data Reviewed: Basic Metabolic Panel:  Recent Labs Lab 07/25/15 0625 07/26/15 0540 07/27/15 0545 07/27/15 2107 07/29/15 0520  NA 134* 135 135 136 135  K 3.4* 3.6 3.4* 3.9 4.5  CL 101 103 102 104 103  CO2 26 26 27 24 25   GLUCOSE 102* 99 118* 265* 157*  BUN 10 7 9 9 15   CREATININE  1.16* 1.09* 1.06* 0.98 1.37*  CALCIUM 7.4* 7.5* 7.5* 7.2* 7.7*  MG  --   --   --  1.6*  --   PHOS  --   --   --  4.6  --    Liver Function Tests:  Recent Labs Lab 07/26/15 0540 07/29/15 0520  AST 17 19  ALT 8* 9*  ALKPHOS 151* 113  BILITOT 0.6 0.4  PROT 4.6* 5.1*  ALBUMIN 2.1* 2.2*   CBC:  Recent Labs Lab 07/23/15 0453  07/25/15 2140 07/26/15 0540 07/27/15 0545 07/27/15 2107 07/29/15 0520  WBC 3.9*  < > 5.5 3.9* 4.0 12.3* 14.0*  NEUTROABS 3.1  --   --   --   --  11.7*  --   HGB 7.8*  < > 8.3* 7.6* 7.3* 9.9* 9.0*  HCT 23.9*  < > 25.0* 23.2* 22.0* 29.9* 28.0*  MCV 89.2  < > 88.7 89.2 88.7 88.7 90.3  PLT 78*  < > 109* 107* 108* 132* 201  < > = values in this interval not displayed.   Recent Results (from the past 240 hour(s))  TECHNOLOGIST REVIEW     Status: None   Collection Time: 07/19/15  8:01 AM  Result Value Ref Range Status   Technologist Review rare rbc fragment present  Final  MRSA PCR Screening     Status: None   Collection Time: 07/19/15  2:22 PM  Result Value Ref Range Status   MRSA by PCR NEGATIVE NEGATIVE Final    Comment:        The GeneXpert MRSA Assay (FDA approved for NASAL  specimens only), is one component of a comprehensive MRSA colonization surveillance program. It is not intended to diagnose MRSA infection nor to guide or monitor treatment for MRSA infections.   Surgical pcr screen     Status: None   Collection Time: 07/27/15 12:06 PM  Result Value Ref Range Status   MRSA, PCR NEGATIVE NEGATIVE Final   Staphylococcus aureus NEGATIVE NEGATIVE Final    Comment:        The Xpert SA Assay (FDA approved for NASAL specimens in patients over 57 years of age), is one component of a comprehensive surveillance program.  Test performance has been validated by [facility] for patients greater than or equal to 7 year old. It is not intended to diagnose infection nor to guide or monitor treatment.      Scheduled Meds: . antiseptic oral rinse  7 mL Mouth Rinse BID  . HYDROmorphone PCA 0.3 mg/mL   Intravenous 6 times per day  . lip balm  1 application Topical BID  . pantoprazole (PROTONIX) IV  40 mg Intravenous QHS   Continuous Infusions: . dextrose 5 % and 0.45% NaCl 75 mL/hr at 07/28/15 1800    Marzetta Board, MD [name] Pager [phone]. If 7 PM - 7 AM, please contact night-coverage at [email or web address], password [facility] 07/29/2015, 7:11 AM  LOS: 10 days

## 2015-07-29 NOTE — Progress Notes (Signed)
Report called and given to [place], RN on [place] transferred to 1523. Left unit in wheelchair pushed by Elmyra Ricks, transporter accompanied by this RN. Left and arrived in good condition. Vwilliams,rn.

## 2015-07-29 NOTE — Progress Notes (Signed)
[facility]  [address] [place], [place] [address] Phone: [phone] FAX: [phone]   Dawnita Molner [id number] December 16, 1960   Problem List:   Principal Problem:   SBO (small bowel obstruction) s/p LOA & SB resection 07/27/2015 Active Problems:   Hypokalemia   Symptomatic anemia   Occult GI bleeding   Foreign body in digestive system   CKD (chronic kidney disease) stage 2, GFR 60-89 ml/min   Cecal colon cancer s/o colectomy/colostomy 10/10/2014   Hydronephrosis of right kidney with h/o metastatic cancer near ureter   2 Days Post-Op  07/27/2015  Preoperative diagnosis: GI bleed, small bowel obstruction, colon cancer   Postoperative diagnosis: GI bleed, Small bowel obstruction with multiple interloop adhesions.  Procedure: lysis of adhesions, small bowel resection with anastomosis  Surgeon: Gurney Maxin, M.D.  Asst: Fanny Skates, M.D.  Assessment  Stabilizing  Plan:  -improved pain control w PCA Dilaudid -HTN control - metoprolol, hydralzine, and enalapril PRN -NGT for ileus & surgery for SBO -inc Cr - IVF bolus & follow closely.  Keep hydrated -watch MS w Dilaudid & complex hospital stay -anemia postop as expected - follow -remove dressing Mon AM per protocol -f/u path -transfer to floor -VTE prophylaxis- SCDs, etc -mobilize as tolerated to help recovery  [name], M.D., F.A.C.S. Gastrointestinal and Minimally Invasive Surgery [facility] [address] [place], [place] [address] [phone] Main / Paging   07/29/2015  Subjective:  Sore Using PCA - less sleepy w Dialudid In bed  Objective:  Vital signs:  Filed Vitals:   07/29/15 0300 07/29/15 0400 07/29/15 0500 07/29/15 0600  BP: 171/67 167/72 169/58 166/68  Pulse: 86 86 87 85  Temp:  98 F (36.7 C)    TempSrc:      Resp: 8 8 8 8   Height:      Weight:      SpO2: 99% 96% 97% 97%    Last BM  Date: 07/27/15  Intake/Output   Yesterday:  09/24 0701 - 09/25 0700 In: 1810 [NG/GT:720] Out: 2140 [Urine:1700; Drains:440] This shift:     Bowel function:  Flatus: n  BM: n  Drain: serosanguinous NGT - thin bilious  Physical Exam:  General: Pt less sleepy, awakens oriented x4 in mild acute distress Eyes: PERRL, normal EOM.  Sclera clear.  No icterus Neuro: CN II-XII intact w/o focal sensory/motor deficits. Lymph: No head/neck/groin lymphadenopathy Psych:  No psychosis/paranoia.  Not agitated HENT: Normocephalic, Mucus membranes moist.  No thrush.  NGT in place Neck: Supple, No tracheal deviation Chest: No chest wall pain w good excursion CV:  Pulses intact.  Regular rhythm MS: Normal AROM mjr joints.  No obvious deformity Abdomen: Soft.  Nondistended.  Dressings c/d/i.   Mildly tender at incisions only.  No evidence of peritonitis.  No incarcerated hernias.  Colostomy pink.  No gas/stool Ext:  SCDs BLE.  No mjr edema.  No cyanosis Skin: No petechiae / purpura  Results:   Labs: Results for orders placed or performed during the hospital encounter of 07/19/15 (from the past 48 hour(s))  Surgical pcr screen     Status: None   Collection Time: 07/27/15 12:06 PM  Result Value Ref Range   MRSA, PCR NEGATIVE NEGATIVE   Staphylococcus aureus NEGATIVE NEGATIVE    Comment:        The Xpert SA Assay (FDA approved for NASAL specimens in patients over 75 years of age), is one component of a  comprehensive surveillance program.  Test performance has been validated by [facility] for patients greater than or equal to 18 year old. It is not intended to diagnose infection nor to guide or monitor treatment.   Prepare RBC     Status: None   Collection Time: 07/27/15 12:54 PM  Result Value Ref Range   Order Confirmation ORDER PROCESSED BY BLOOD BANK   Prepare Pheresed Platelets     Status: None (Preliminary result)   Collection Time: 07/27/15  1:30 PM  Result Value Ref Range    Unit Number [id number]    Blood Component Type PLTPHER LI2    Unit division 00    Status of Unit ALLOCATED    Transfusion Status OK TO TRANSFUSE   Prepare fresh frozen plasma     Status: None (Preliminary result)   Collection Time: 07/27/15  3:00 PM  Result Value Ref Range   Unit Number [id number]    Blood Component Type THW PLS APHR    Unit division 00    Status of Unit ISSUED    Transfusion Status OK TO TRANSFUSE   Prepare fresh frozen plasma     Status: None (Preliminary result)   Collection Time: 07/27/15  4:30 PM  Result Value Ref Range   Unit Number [id number]    Blood Component Type THW PLS APHR    Unit division B0    Status of Unit ALLOCATED    Transfusion Status OK TO TRANSFUSE   Basic metabolic panel     Status: Abnormal   Collection Time: 07/27/15  9:07 PM  Result Value Ref Range   Sodium 136 135 - 145 mmol/L   Potassium 3.9 3.5 - 5.1 mmol/L   Chloride 104 101 - 111 mmol/L   CO2 24 22 - 32 mmol/L   Glucose, Bld 265 (H) 65 - 99 mg/dL   BUN 9 6 - 20 mg/dL   Creatinine, Ser 0.98 0.44 - 1.00 mg/dL   Calcium 7.2 (L) 8.9 - 10.3 mg/dL   GFR calc non Af Amer >60 >60 mL/min   GFR calc Af Amer >60 >60 mL/min    Comment: (NOTE) The eGFR has been calculated using the CKD EPI equation. This calculation has not been validated in all clinical situations. eGFR's persistently <60 mL/min signify possible Chronic Kidney Disease.    Anion gap 8 5 - 15  Magnesium     Status: Abnormal   Collection Time: 07/27/15  9:07 PM  Result Value Ref Range   Magnesium 1.6 (L) 1.7 - 2.4 mg/dL  Phosphorus     Status: None   Collection Time: 07/27/15  9:07 PM  Result Value Ref Range   Phosphorus 4.6 2.5 - 4.6 mg/dL  CBC WITH DIFFERENTIAL     Status: Abnormal   Collection Time: 07/27/15  9:07 PM  Result Value Ref Range   WBC 12.3 (H) 4.0 - 10.5 K/uL   RBC 3.37 (L) 3.87 - 5.11 MIL/uL   Hemoglobin 9.9 (L) 12.0 - 15.0 g/dL    Comment: DELTA CHECK NOTED REPEATED TO VERIFY POST  TRANSFUSION SPECIMEN    HCT 29.9 (L) 36.0 - 46.0 %   MCV 88.7 78.0 - 100.0 fL   MCH 29.4 26.0 - 34.0 pg   MCHC 33.1 30.0 - 36.0 g/dL   RDW 15.6 (H) 11.5 - 15.5 %   Platelets 132 (L) 150 - 400 K/uL   Neutrophils Relative % 95 %   Neutro Abs 11.7 (H) 1.7 - 7.7 K/uL   Lymphocytes  Relative 1 %   Lymphs Abs 0.1 (L) 0.7 - 4.0 K/uL   Monocytes Relative 4 %   Monocytes Absolute 0.5 0.1 - 1.0 K/uL   Eosinophils Relative 0 %   Eosinophils Absolute 0.0 0.0 - 0.7 K/uL   Basophils Relative 0 %   Basophils Absolute 0.0 0.0 - 0.1 K/uL  Protime-INR     Status: None   Collection Time: 07/27/15  9:07 PM  Result Value Ref Range   Prothrombin Time 15.1 11.6 - 15.2 seconds   INR 1.17 0.00 - 1.49  APTT     Status: None   Collection Time: 07/27/15  9:07 PM  Result Value Ref Range   aPTT 27 24 - 37 seconds  Comprehensive metabolic panel     Status: Abnormal   Collection Time: 07/29/15  5:20 AM  Result Value Ref Range   Sodium 135 135 - 145 mmol/L   Potassium 4.5 3.5 - 5.1 mmol/L   Chloride 103 101 - 111 mmol/L   CO2 25 22 - 32 mmol/L   Glucose, Bld 157 (H) 65 - 99 mg/dL   BUN 15 6 - 20 mg/dL   Creatinine, Ser 1.37 (H) 0.44 - 1.00 mg/dL   Calcium 7.7 (L) 8.9 - 10.3 mg/dL   Total Protein 5.1 (L) 6.5 - 8.1 g/dL   Albumin 2.2 (L) 3.5 - 5.0 g/dL   AST 19 15 - 41 U/L   ALT 9 (L) 14 - 54 U/L   Alkaline Phosphatase 113 38 - 126 U/L   Total Bilirubin 0.4 0.3 - 1.2 mg/dL   GFR calc non Af Amer 43 (L) >60 mL/min   GFR calc Af Amer 50 (L) >60 mL/min    Comment: (NOTE) The eGFR has been calculated using the CKD EPI equation. This calculation has not been validated in all clinical situations. eGFR's persistently <60 mL/min signify possible Chronic Kidney Disease.    Anion gap 7 5 - 15  CBC     Status: Abnormal   Collection Time: 07/29/15  5:20 AM  Result Value Ref Range   WBC 14.0 (H) 4.0 - 10.5 K/uL   RBC 3.10 (L) 3.87 - 5.11 MIL/uL   Hemoglobin 9.0 (L) 12.0 - 15.0 g/dL   HCT 28.0 (L) 36.0 -  46.0 %   MCV 90.3 78.0 - 100.0 fL   MCH 29.0 26.0 - 34.0 pg   MCHC 32.1 30.0 - 36.0 g/dL   RDW 16.3 (H) 11.5 - 15.5 %   Platelets 201 150 - 400 K/uL    Imaging / Studies: Dg Abd 2 Views  07/27/2015   CLINICAL DATA:  Post op-bowel obstruction  Best obtainable images  EXAM: ABDOMEN - 2 VIEW  COMPARISON:  CT of the abdomen and pelvis 07/25/2015  FINDINGS: Surgical clips overlie the midline of the abdomen. Surgical drain is identified in the pelvis. Bowel gas pattern is nonobstructed. Orogastric tube tip overlies the level of the proximal stomach. Bowel sutures are identified in the right lower quadrant of the abdomen. Small right pleural effusion is noted.  IMPRESSION: 1. Postoperative changes. 2. Nonobstructive bowel gas pattern. 3. Right pleural effusion.   Electronically Signed   By: Nolon Nations M.D.   On: 07/27/2015 18:27   Dg Abd Portable 1v  07/27/2015   CLINICAL DATA:  Lifting for endoscopic capsule.  EXAM: PORTABLE ABDOMEN - 1 VIEW  COMPARISON:  None.  FINDINGS: Resected small bowel segment is imaged. Suture lines are noted. No radiopaque endoscopic capsule visualized.  IMPRESSION:  No radiopaque endoscopic capsule visualized.   Electronically Signed   By: Rolm Baptise M.D.   On: 07/27/2015 15:27    Medications / Allergies: per chart  Antibiotics: Anti-infectives    Start     Dose/Rate Route Frequency Ordered Stop   07/27/15 2330  ciprofloxacin (CIPRO) IVPB 400 mg     400 mg 200 mL/hr over 60 Minutes Intravenous  Once 07/27/15 1746 07/28/15 0116   07/27/15 2100  metroNIDAZOLE (FLAGYL) IVPB 500 mg     500 mg 100 mL/hr over 60 Minutes Intravenous Every 8 hours 07/27/15 1746 07/28/15 0532   07/27/15 1300  metroNIDAZOLE (FLAGYL) IVPB 500 mg     500 mg 100 mL/hr over 60 Minutes Intravenous On call to O.R. 07/27/15 1153 07/27/15 1313   07/27/15 1300  ciprofloxacin (CIPRO) IVPB 400 mg     400 mg 200 mL/hr over 60 Minutes Intravenous On call to O.R. 07/27/15 1153 07/27/15 1329         Note: Portions of this report may have been transcribed using voice recognition software. Every effort was made to ensure accuracy; however, inadvertent computerized transcription errors may be present.   Any transcriptional errors that result from this process are unintentional.     [name], M.D., F.A.C.S. Gastrointestinal and Minimally Invasive Surgery [facility] [address], [place], [place] [address] [phone] Main / Paging   07/29/2015  CARE TEAM:  PCP: [name], MD  Outpatient Care Team: Patient Care Team: [name], MD as PCP - General (Family Medicine) Alexis Frock, MD as Consulting Physician (Urology) Sheilah Mins, MD as Referring Physician (Surgical Oncology) Gatha Mayer, MD as Consulting Physician (Gastroenterology) Truitt Merle, MD as Consulting Physician (Oncology) Judeth Horn, MD as Consulting Physician (General Surgery) Md Ccs, MD as Consulting Physician (General Surgery)  Inpatient Treatment Team: Treatment Team: Attending [place]: Caren Griffins, MD; Technician: Lonia Blood, NT; Consulting Physician: Truitt Merle, MD; Registered Nurse: Charlesetta Ivory, RN; Technician: Ileene Rubens, NT; Rounding Team: Threasa Beards, MD; Consulting Physician: Nolon Nations, MD; Consulting Physician: Alexis Frock, MD; Registered Nurse: Yolanda Bonine, RN; Registered Nurse: Sheron Nightingale, RN; Registered Nurse: Trula Ore, RN; Registered Nurse: Danie Chandler, RN; Physical Therapist: Mathis Fare, PT; Registered Nurse: Candie Chroman, RN

## 2015-07-30 ENCOUNTER — Encounter (HOSPITAL_COMMUNITY): Payer: Self-pay | Admitting: General Surgery

## 2015-07-30 LAB — PREPARE FRESH FROZEN PLASMA: Unit division: 0

## 2015-07-30 LAB — PREPARE PLATELET PHERESIS: Unit division: 0

## 2015-07-30 LAB — TYPE AND SCREEN
ABO/RH(D): A POS
Antibody Screen: NEGATIVE
Unit division: 0
Unit division: 0
Unit division: 0
Unit division: 0

## 2015-07-30 LAB — COMPREHENSIVE METABOLIC PANEL
ALBUMIN: 2 g/dL — AB (ref 3.5–5.0)
ALK PHOS: 91 U/L (ref 38–126)
ALT: 7 U/L — ABNORMAL LOW (ref 14–54)
ANION GAP: 6 (ref 5–15)
AST: 16 U/L (ref 15–41)
BILIRUBIN TOTAL: 0.5 mg/dL (ref 0.3–1.2)
BUN: 11 mg/dL (ref 6–20)
CALCIUM: 7.5 mg/dL — AB (ref 8.9–10.3)
CO2: 28 mmol/L (ref 22–32)
Chloride: 104 mmol/L (ref 101–111)
Creatinine, Ser: 0.92 mg/dL (ref 0.44–1.00)
GFR calc non Af Amer: >60 mL/min (ref >60)
Glucose, Bld: 125 mg/dL — ABNORMAL HIGH (ref 65–99)
POTASSIUM: 3.6 mmol/L (ref 3.5–5.1)
Sodium: 138 mmol/L (ref 135–145)
TOTAL PROTEIN: 4.4 g/dL — AB (ref 6.5–8.1)

## 2015-07-30 LAB — CBC
HEMATOCRIT: 23.2 % — AB (ref 36.0–46.0)
HEMOGLOBIN: 7.5 g/dL — AB (ref 12.0–15.0)
MCH: 29.1 pg (ref 26.0–34.0)
MCHC: 32.3 g/dL (ref 30.0–36.0)
MCV: 89.9 fL (ref 78.0–100.0)
Platelets: 94 10*3/uL — ABNORMAL LOW (ref 150–400)
RBC: 2.58 MIL/uL — AB (ref 3.87–5.11)
RDW: 15.9 % — ABNORMAL HIGH (ref 11.5–15.5)
WBC: 6 10*3/uL (ref 4.0–10.5)

## 2015-07-30 LAB — MAGNESIUM: MAGNESIUM: 2.2 mg/dL (ref 1.7–2.4)

## 2015-07-30 LAB — PHOSPHORUS: PHOSPHORUS: 2.5 mg/dL (ref 2.5–4.6)

## 2015-07-30 MED ORDER — LORAZEPAM 2 MG/ML IJ SOLN: 0.5000 mg | Freq: Four times a day (QID) | INTRAMUSCULAR | Status: DC | PRN

## 2015-07-30 MED ORDER — HYDROMORPHONE HCL 1 MG/ML IJ SOLN
0.5000 mg | INTRAMUSCULAR | Status: DC | PRN
  Administered 2015-07-31 – 2015-08-03 (×12): 1 mg via INTRAVENOUS
  Filled 2015-07-30 (×12): qty 1

## 2015-07-30 MED ORDER — ZOLPIDEM TARTRATE 5 MG PO TABS
5.0000 mg | ORAL_TABLET | Freq: Every day | ORAL | Status: DC
  Administered 2015-07-30 – 2015-08-04 (×6): 5 mg via ORAL
  Filled 2015-07-30 (×6): qty 1

## 2015-07-30 MED ORDER — KCL IN DEXTROSE-NACL 20-5-0.9 MEQ/L-%-% IV SOLN
INTRAVENOUS | Status: DC
  Administered 2015-07-30 (×2): via INTRAVENOUS
  Filled 2015-07-30 (×3): qty 1000

## 2015-07-30 NOTE — Progress Notes (Signed)
OT Cancellation Note  Patient Details Name: Allison Mitchell MRN: [id number] DOB: 08/27/1961   Cancelled Treatment:    Reason Eval/Treat Not Completed: Fatigue/lethargy limiting ability to participate  Will recheck on pt next day  Betsy Pries 07/30/2015, 3:18 PM

## 2015-07-30 NOTE — Progress Notes (Signed)
PROGRESS NOTE  Allison Mitchell ION:[id number] DOB: 1961/02/10 DOA: 07/19/2015 PCP: [name], MD   HPI: Allison Mitchell is a 54 y.o white female who presents to the unit following several weeks of maroon-colored watery stool in her ostomy bag. Recent endoscopy (upper and lower) per Dr. Burr Medico unable to reveal source. Patient admitted for further workup of GI bleed. Initally laboratory revealed a hemoglobin of 7.4, which required a transfusion. Throughout her hospital course thus far, multiple transfusions have been required due to decreasing hemoglobin. RBC scan, capsule study, and Meckel's unable to find etiology. PMH includes stage III colon cancer s/p right hemicolectomy on 10/10/14 by Dr. Hulen Skains.   Patient continued to have bleeding requiring several transfusions and she underwent a capsule study as well, which resulted in capsule being stuck. Patient was eventually taken to the OR on 9/23 for exlap.  Subjective / 24 H Interval events Today the patient expresses a desire to "get this tube out of my nose", but otherwise no complaints. Denies pain. Able to ambulate to bathroom. She describes no collection in ostomy bag. She has not passed gas yet. Denies fever, chills, vomiting, or abdominal pain. Endorses slight nausea.   Assessment/Plan: Principal Problem:   SBO (small bowel obstruction) s/p LOA & SB resection 07/27/2015 Active Problems:   Hypokalemia   Symptomatic anemia   Occult GI bleeding   Foreign body in digestive system   CKD (chronic kidney disease) stage 2, GFR 60-89 ml/min   Cecal colon cancer s/o colectomy/colostomy 10/10/2014   Hydronephrosis of right kidney with h/o metastatic cancer near ureter   Colostomy in place  Acute blood loss anemia due to occult GI bleeding in the setting of SBO s/p LOA and SB resection on 9/23 - Patient required significant amounts of blood transfusions, 8 U fo far (s/p 2U pRBC 9/15, 2U 9/16 and 2U 9/21 and 2U 9/23), platelets on 9/23 and FFP x 2  on 9/23 intraop - Negative workup prior to OR (RBC scan, Capsule study, Meckel's study, CT-Enterogram) - Recently had an EGD and a lower endoscopy this month without acute findings - S/p LOA and SB resection 9/23, extensive surgery - NG tube remains in place, continue current pain control, mobilize as tolerated - Hb continued to decrease (7.5 today), but still likely a component of post op ABLA. Monitor   Right hydroureteronephrosis - This is acute on chronic - Denies flank tenderness and urinary symptoms today -Dr. Tresa Moore consulted, will observe for now, consider stent replacement if GFR declines or patient starts nephrotoxic chemo again  - Cr WNL (.92) today with increase fluids. Monitor- If worsening, consider renal US.   HTN - Secondary to surgical pain, but stable (139/66) today - PRNs available. Continue NPO  Stage III Colon Cancer  - S/p right hemicolectomy with current colostomy.  - Managed at the cancer center w/ chemo and radiation therapy, currently being considered for cytoreductive surgery and HIPEC - Oncology following, chemo remains on hold   CKD stage II - Cr remains stable, continue to monitor  Chemotherapy induced pancytopenia - Remains stable, closely monitor platelets/WBC  - Leukocytosis resolved, Hg and Plt decreased, but suspected secondary to ABL. Monitor   Hypokalemia - Resolved  Hyponatremia - Resolved   Diet: Diet NPO time specified Except for: Ice Chips Fluids: NS DVT Prophylaxis: SCD  Code Status: Full Code Family Communication: no family bedside  Disposition Plan: home when ready   Consultants:  GI   IR   Oncology   Surgery  Urology  Procedures:  RBC scan   Capsule study   Meckel scan   LOA & SB resection 07/27/2015   Antibiotics  Anti-infectives    Start     Dose/Rate Route Frequency Ordered Stop   07/27/15 2330  ciprofloxacin (CIPRO) IVPB 400 mg     400 mg 200 mL/hr over 60 Minutes Intravenous  Once  07/27/15 1746 07/28/15 0116   07/27/15 2100  metroNIDAZOLE (FLAGYL) IVPB 500 mg     500 mg 100 mL/hr over 60 Minutes Intravenous Every 8 hours 07/27/15 1746 07/28/15 0532   07/27/15 1300  metroNIDAZOLE (FLAGYL) IVPB 500 mg     500 mg 100 mL/hr over 60 Minutes Intravenous On call to O.R. 07/27/15 1153 07/27/15 1313   07/27/15 1300  ciprofloxacin (CIPRO) IVPB 400 mg     400 mg 200 mL/hr over 60 Minutes Intravenous On call to O.R. 07/27/15 1153 07/27/15 1329       Studies  No results found.  Objective  Filed Vitals:   07/30/15 0400 07/30/15 0612 07/30/15 0650 07/30/15 0748  BP:  139/66    Pulse:  82    Temp:  98.2 F (36.8 C)    TempSrc:  Oral    Resp: 12 15  12   Height:      Weight:   93.396 kg (205 lb 14.4 oz)   SpO2: 99% 100%  99%    Intake/Output Summary (Last 24 hours) at 07/30/15 1012 Last data filed at 07/30/15 0953  Gross per 24 hour  Intake   1920 ml  Output   3200 ml  Net  -1280 ml   Filed Weights   07/28/15 0355 07/29/15 1000 07/30/15 0650  Weight: 94.7 kg (208 lb 12.4 oz) 94.7 kg (208 lb 12.4 oz) 93.396 kg (205 lb 14.4 oz)    Exam:  GENERAL: Fatigued, sitting up in bed with no acute distress  HEENT: head NCAT. Mucous membranes are moist. NG tube present   NECK: Supple. No lymphadenopathy or thyromegaly.  LUNGS: Clear to auscultation. No wheezing or crackles  HEART: Regular rate and rhythm 2/6 systolic ejection murmur at LUSB. No JVD or peripheral edema  ABDOMEN: Mid-line Incision without erythema, edema, induration, warmth or drainage. Empty colostomy bag in LLQ. Drain in RLQ with serous fluid. Soft, nontender, and nondistended. No bowel sounds noted  EXTREMITIES: Without any cyanosis, clubbing, rash, lesions or edema  NEUROLOGIC: Alert and oriented x3. No focal neurological deficits noted   PSYCHIATRIC: Very flat affect  SKIN: No ulceration or induration present. See abdomen for surgical incision information   Data Reviewed: Basic  Metabolic Panel:  Recent Labs Lab 07/26/15 0540 07/27/15 0545 07/27/15 2107 07/29/15 0520 07/30/15 0525  NA 135 135 136 135 138  K 3.6 3.4* 3.9 4.5 3.6  CL 103 102 104 103 104  CO2 26 27 24 25 28   GLUCOSE 99 118* 265* 157* 125*  BUN 7 9 9 15 11   CREATININE 1.09* 1.06* 0.98 1.37* 0.92  CALCIUM 7.5* 7.5* 7.2* 7.7* 7.5*  MG  --   --  1.6*  --  2.2  PHOS  --   --  4.6  --  2.5   Liver Function Tests:  Recent Labs Lab 07/26/15 0540 07/29/15 0520 07/30/15 0525  AST 17 19 16   ALT 8* 9* 7*  ALKPHOS 151* 113 91  BILITOT 0.6 0.4 0.5  PROT 4.6* 5.1* 4.4*  ALBUMIN 2.1* 2.2* 2.0*   CBC:  Recent Labs Lab 07/26/15 0540 07/27/15 0545  07/27/15 2107 07/29/15 0520 07/30/15 0525  WBC 3.9* 4.0 12.3* 14.0* 6.0  NEUTROABS  --   --  11.7*  --   --   HGB 7.6* 7.3* 9.9* 9.0* 7.5*  HCT 23.2* 22.0* 29.9* 28.0* 23.2*  MCV 89.2 88.7 88.7 90.3 89.9  PLT 107* 108* 132* 201 94*   CBG: No results for input(s): GLUCAP in the last 168 hours.  Recent Results (from the past 240 hour(s))  Surgical pcr screen     Status: None   Collection Time: 07/27/15 12:06 PM  Result Value Ref Range Status   MRSA, PCR NEGATIVE NEGATIVE Final   Staphylococcus aureus NEGATIVE NEGATIVE Final    Comment:        The Xpert SA Assay (FDA approved for NASAL specimens in patients over 67 years of age), is one component of a comprehensive surveillance program.  Test performance has been validated by [facility] for patients greater than or equal to 13 year old. It is not intended to diagnose infection nor to guide or monitor treatment.      Scheduled Meds: . antiseptic oral rinse  7 mL Mouth Rinse BID  . HYDROmorphone PCA 0.3 mg/mL   Intravenous 6 times per day  . lip balm  1 application Topical BID  . metoprolol  5 mg Intravenous 4 times per day  . pantoprazole (PROTONIX) IV  40 mg Intravenous QHS   Continuous Infusions: . dextrose 5 % and 0.45% NaCl 100 mL/hr at 07/30/15 0009     Allison  Mitchell, Student-PA  Marzetta Board, MD [name] Pager [phone]. If 7 PM - 7 AM, please contact night-coverage at [email or web address], password [facility] 07/30/2015, 10:12 AM  LOS: 11 days

## 2015-07-30 NOTE — Progress Notes (Signed)
3 Days Post-Op  Subjective: She still feels pretty bad, wants the NG out, but nothing coming from her colostomy.  Her H/H is down.  She is not using the PCA and complains it beeps all the time.    Objective: Vital signs in last 24 hours: Temp:  [97.8 F (36.6 C)-98.3 F (36.8 C)] 98.2 F (36.8 C) (09/26 0612) Pulse Rate:  [78-85] 82 (09/26 0612) Resp:  [10-17] 12 (09/26 0748) BP: (118-145)/(49-70) 139/66 mmHg (09/26 0612) SpO2:  [96 %-100 %] 99 % (09/26 0748) Weight:  [93.396 kg (205 lb 14.4 oz)] 93.396 kg (205 lb 14.4 oz) (09/26 0650) Last BM Date: 07/27/15 (per ostomy) 525 recorded from the NG yesterday 5oo from LLQ surgical drain No BM NPO Afebrile, VSS Anemia, H/H down to 7.5/23.2 Intake/Output from previous day: 09/25 0701 - 09/26 0700 In: 2340 [I.V.:2100; NG/GT:240] Out: 2450 [Urine:1425; Emesis/NG output:525; Drains:500] Intake/Output this shift: Total I/O In: 120 [Other:120] Out: 750 [Urine:500; Drains:250]  General appearance: alert, cooperative, no distress, pale and tired. GI: tender, no BS, the ostomy has nothing coming out from it., no gas in the ostomy bag.  Lab Results:   Recent Labs  07/29/15 0520 07/30/15 0525  WBC 14.0* 6.0  HGB 9.0* 7.5*  HCT 28.0* 23.2*  PLT 201 94*    BMET  Recent Labs  07/29/15 0520 07/30/15 0525  NA 135 138  K 4.5 3.6  CL 103 104  CO2 25 28  GLUCOSE 157* 125*  BUN 15 11  CREATININE 1.37* 0.92  CALCIUM 7.7* 7.5*   PT/INR  Recent Labs  07/27/15 2107  LABPROT 15.1  INR 1.17     Recent Labs Lab 07/26/15 0540 07/29/15 0520 07/30/15 0525  AST 17 19 16   ALT 8* 9* 7*  ALKPHOS 151* 113 91  BILITOT 0.6 0.4 0.5  PROT 4.6* 5.1* 4.4*  ALBUMIN 2.1* 2.2* 2.0*     Lipase  No results found for: LIPASE   Studies/Results: No results found.  Medications: . antiseptic oral rinse  7 mL Mouth Rinse BID  . HYDROmorphone PCA 0.3 mg/mL   Intravenous 6 times per day  . lip balm  1 application Topical BID  .  metoprolol  5 mg Intravenous 4 times per day  . pantoprazole (PROTONIX) IV  40 mg Intravenous QHS   . dextrose 5 % and 0.45% NaCl 100 mL/hr at 07/30/15 0009   Prior to Admission medications   Medication Sig Start Date End Date Taking? Authorizing Jernee Murtaugh  acetaminophen (TYLENOL) 325 MG tablet Take 325 mg by mouth every 6 (six) hours as needed for moderate pain.   Yes Historical Mason Burleigh, MD  citalopram (CELEXA) 20 MG tablet Take 20 mg by mouth every morning.  09/18/14  Yes Historical Isreal Moline, MD  lidocaine-prilocaine (EMLA) cream Apply 1 application topically as needed. Patient taking differently: Apply 1 application topically as needed (port).  11/16/14  Yes Truitt Merle, MD  magnesium hydroxide (MILK OF MAGNESIA) 400 MG/5ML suspension Take 5 mLs by mouth daily as needed for mild constipation.   Yes Historical Yasmin Dibello, MD  omeprazole (PRILOSEC) 40 MG capsule Take 40 mg by mouth daily.   Yes Historical Yvonda Fouty, MD  ondansetron (ZOFRAN) 8 MG tablet Take 1 tablet (8 mg total) by mouth every 8 (eight) hours as needed for nausea or vomiting. 01/04/15  Yes Truitt Merle, MD  oxyCODONE (OXY IR/ROXICODONE) 5 MG immediate release tablet Take 1 tablet (5 mg total) by mouth every 6 (six) hours as needed for severe  pain. 05/16/15  Yes Truitt Merle, MD  pantoprazole (PROTONIX) 40 MG tablet Take 1 tablet (40 mg total) by mouth 2 (two) times daily. 06/14/15  Yes Truitt Merle, MD  potassium chloride SA (K-DUR,KLOR-CON) 20 MEQ tablet Take 1 tablet (20 mEq total) by mouth daily. Take as directed. 06/28/15  Yes Truitt Merle, MD  PREVIDENT 5000 BOOSTER PLUS 1.1 % PSTE 1 application by Other route daily.  03/20/15  Yes Historical Maily Debarge, MD  zolpidem (AMBIEN) 5 MG tablet Take 1 tablet (5 mg total) by mouth at bedtime as needed for sleep. 05/31/15  Yes Truitt Merle, MD     Assessment/Plan GI bleed, small bowel obstruction, adenocarcinoma of the colon   S/p lysis of adhesions, small bowel resection with anastomosis, 07/27/15, Dr. Gurney Maxin  POD 3 Probable carcinomatosis on CT scan  Stage III colon cancer (adenocarcinoma) S/p en bloc resection with right colectomy, sigmoid colectomy, colostomy revision, , ureteral reimplantation on the right, 12 /08/15, Dr. Doreen Salvage S/p capsule endoscopy, which has not been recovered. Chemotherapy and radiation therapy Hydronephrosis of right kidney with h/o metastatic cancer near ureter Anemia and neuropathy associated with chemotherapy Hx of PE Anxiety/Depression GERD Anemia post op Antibiotics: None DVT: No heparin secondary to bleeding/SCD's added  Plan:  She has no bowel function currently, I will have to keep the NG.  I will recheck her cbc later and be sure she has a type and screen.  I don't see the capsule in the resected colon or the post op film. Work on getting her up more and mobilizing her more.  Add K+ to IV fluids.    LOS: 11 days    JENNINGS,WILLARD 07/30/2015

## 2015-07-30 NOTE — Progress Notes (Deleted)
Patient's vital signs stable, tolerating regular diet, walking in halls.  Patient has had BM and is passing gas.  Patient's pain is controlled with oral pain meds..  Discharge instructions given to patient and her daughter. Patient verbalizes understanding. Patient discharged to home.

## 2015-07-30 NOTE — Progress Notes (Signed)
Allison Mitchell   DOB:29-Jul-1961   WU#:[id number]   TUU#:[id number]  Subjective: she had lysis of adhesions, small bowel resection with anastomosis on 9/23, had a small amount bloody output at stoma bag, still nothing by mouth and has NG tube in. Hemoglobin was stable over the weekend, dropped again today from 9 to 7.5.   Objective:  Filed Vitals:   07/30/15 1440  BP: 126/60  Pulse: 87  Temp: 98.4 F (36.9 C)  Resp: 18    Body mass index is 31.31 kg/(m^2).  Intake/Output Summary (Last 24 hours) at 07/30/15 1844 Last data filed at 07/30/15 1755  Gross per 24 hour  Intake 2218.33 ml  Output   3635 ml  Net -1416.67 ml     Sclerae unicteric  Oropharynx clear  No peripheral adenopathy  Lungs clear -- no rales or rhonchi  Heart regular rate and rhythm  Abdomen soft, (+) small dark red liquid in colostomy bag  MSK no focal spinal tenderness, no peripheral edema  Neuro nonfocal    CBG (last 3)  No results for input(s): GLUCAP in the last 72 hours.   Labs:  Lab Results  Component Value Date   WBC 6.0 07/30/2015   HGB 7.5* 07/30/2015   HCT 23.2* 07/30/2015   MCV 89.9 07/30/2015   PLT 94* 07/30/2015   NEUTROABS 11.7* 07/27/2015    @LASTCHEMISTRY @  Urine Studies No results for input(s): UHGB, CRYS in the last 72 hours.  Invalid input(s): UACOL, UAPR, USPG, UPH, UTP, UGL, UKET, UBIL, UNIT, UROB, [place], UEPI, UWBC, [place] Duff[place], [place]  Basic Metabolic Panel:  Recent Labs Lab 07/26/15 0540 07/27/15 0545 07/27/15 2107 07/29/15 0520 07/30/15 0525  NA 135 135 136 135 138  K 3.6 3.4* 3.9 4.5 3.6  CL 103 102 104 103 104  CO2 26 27 24 25 28   GLUCOSE 99 118* 265* 157* 125*  BUN 7 9 9 15 11   CREATININE 1.09* 1.06* 0.98 1.37* 0.92  CALCIUM 7.5* 7.5* 7.2* 7.7* 7.5*  MG  --   --  1.6*  --  2.2  PHOS  --   --  4.6  --  2.5   GFR Estimated Creatinine Clearance: 83.5 mL/min (by C-G formula based on Cr of 0.92). Liver Function Tests:  Recent Labs Lab  07/26/15 0540 07/29/15 0520 07/30/15 0525  AST 17 19 16   ALT 8* 9* 7*  ALKPHOS 151* 113 91  BILITOT 0.6 0.4 0.5  PROT 4.6* 5.1* 4.4*  ALBUMIN 2.1* 2.2* 2.0*   No results for input(s): LIPASE, AMYLASE in the last 168 hours. No results for input(s): AMMONIA in the last 168 hours. Coagulation profile  Recent Labs Lab 07/27/15 2107  INR 1.17    CBC:  Recent Labs Lab 07/26/15 0540 07/27/15 0545 07/27/15 2107 07/29/15 0520 07/30/15 0525  WBC 3.9* 4.0 12.3* 14.0* 6.0  NEUTROABS  --   --  11.7*  --   --   HGB 7.6* 7.3* 9.9* 9.0* 7.5*  HCT 23.2* 22.0* 29.9* 28.0* 23.2*  MCV 89.2 88.7 88.7 90.3 89.9  PLT 107* 108* 132* 201 94*   Cardiac Enzymes: No results for input(s): CKTOTAL, CKMB, CKMBINDEX, TROPONINI in the last 168 hours. BNP: Invalid input(s): POCBNP CBG: No results for input(s): GLUCAP in the last 168 hours. D-Dimer No results for input(s): DDIMER in the last 72 hours. Hgb A1c No results for input(s): HGBA1C in the last 72 hours. Lipid Profile No results for input(s): CHOL, HDL, LDLCALC, TRIG, CHOLHDL, LDLDIRECT in  the last 72 hours. Thyroid function studies No results for input(s): TSH, T4TOTAL, T3FREE, THYROIDAB in the last 72 hours.  Invalid input(s): FREET3 Anemia work up No results for input(s): VITAMINB12, FOLATE, FERRITIN, TIBC, IRON, RETICCTPCT in the last 72 hours. Microbiology Recent Results (from the past 240 hour(s))  Surgical pcr screen     Status: None   Collection Time: 07/27/15 12:06 PM  Result Value Ref Range Status   MRSA, PCR NEGATIVE NEGATIVE Final   Staphylococcus aureus NEGATIVE NEGATIVE Final    Comment:        The Xpert SA Assay (FDA approved for NASAL specimens in patients over 61 years of age), is one component of a comprehensive surveillance program.  Test performance has been validated by [facility] for patients greater than or equal to 27 year old. It is not intended to diagnose infection nor to guide or monitor  treatment.    Diagnosis 07/27/2015 Small intestine, resection SMALL BOWEL WITH SEROSA ADHESION AND REACTIVE CHANGES NEGATIVE FOR MALIGNANCY   Studies:  No results found.  Assessment: 54 y.o.   1. GI bleeding, unclear source, RBC scan negative, undergoing capsule endoscopy, and small bowel resection on 07/27/15. Surgical path of the small bowel was negative for malignancy or bleeding lesion.   2. Anemia, secondary to GI bleeding, chemo, no evidence of hemolysis  3. Metastatic colon cancer  4 right side pleural effusion, s/p thoracentesis 9/5, cytology (-) 5. Thrombocytopenia, secondary to chemotherapy, consumption from bleeding, worsening again today  6. History of PE, off coumadin    Plan: -Her small amount of bloody output in stoma back could be related to her surgery, however giving her drop in H&H, and drop in her platelet count again, and no clear source of bleeding identified on the resected small bowel, I'm concerned about her recurrent GI bleeding. -Check CBC every 12 hours -Consider blood transfusion if hemoglobin less than 8 -I spoke with surgeon Dr. Marcello Moores today, she wants to keep her NG tube for now -Reconsult GI if she has recurrent bleeding.  I will follow up.     Truitt Merle, MD 07/30/2015  6:44 PM

## 2015-07-30 NOTE — Evaluation (Signed)
Physical Therapy Evaluation Patient Details Name: Edell Mesenbrink MRN: [id number] DOB: 1961-08-31 Today's Date: 07/30/2015   History of Present Illness  54 yo female s/p lysis of adhesions, SB resection 07/27/15. Hx of colon cancer, R partial hemicolectomy, chemo induced peripheral neuropathy  Clinical Impression  On eval, pt was Min guard assist for mobility-walked ~500 feet while holding onto IV pole. Pt denied lightheadedness. Tolerated activity well. Recommend daily ambulation in hallway with nursing supervision.     Follow Up Recommendations No PT follow up;Supervision - Intermittent    Equipment Recommendations  None recommended by PT    Recommendations for Other Services       Precautions / Restrictions Precautions Precautions: None Precaution Comments: abdominal surgery, multiple lines Restrictions Weight Bearing Restrictions: No      Mobility  Bed Mobility               General bed mobility comments: oob in recliner  Transfers Overall transfer level: Needs assistance   Transfers: Sit to/from Stand Sit to Stand: Supervision         General transfer comment: for safety  Ambulation/Gait Ambulation/Gait assistance: Min guard Ambulation Distance (Feet): 500 Feet Assistive device:  (IV pole) Gait Pattern/deviations: Step-through pattern;Decreased stride length     General Gait Details: close guard for safety. Pt denied lightheadedness  Stairs            Wheelchair Mobility    Modified Rankin (Stroke Patients Only)       Balance                                             Pertinent Vitals/Pain Pain Assessment: Faces Faces Pain Scale: Hurts a little bit Pain Location: abdomen Pain Intervention(s): Monitored during session    Home Living Family/patient expects to be discharged to:: Private residence Living Arrangements: Spouse/significant other;Children   Type of Home: House Home Access: Level entry     Home  Layout: One level;Able to live on main level with bedroom/bathroom Home Equipment: None      Prior Function Level of Independence: Independent               Hand Dominance        Extremity/Trunk Assessment   Upper Extremity Assessment: Defer to OT evaluation           Lower Extremity Assessment: Overall WFL for tasks assessed      Cervical / Trunk Assessment: Normal  Communication   Communication: No difficulties  Cognition Arousal/Alertness: Awake/alert Behavior During Therapy: WFL for tasks assessed/performed Overall Cognitive Status: Within Functional Limits for tasks assessed                      General Comments      Exercises        Assessment/Plan    PT Assessment Patient needs continued PT services  PT Diagnosis Acute pain;Difficulty walking   PT Problem List Decreased mobility;Pain;Decreased activity tolerance  PT Treatment Interventions Gait training;Functional mobility training;Therapeutic activities;Patient/family education;Therapeutic exercise   PT Goals (Current goals can be found in the Care Plan section) Acute Rehab PT Goals Patient Stated Goal: none stated PT Goal Formulation: With patient Time For Goal Achievement: 08/13/15 Potential to Achieve Goals: Good    Frequency Min 3X/week   Barriers to discharge        Co-evaluation  End of Session   Activity Tolerance: Patient tolerated treatment well Patient left: in chair;with call bell/phone within reach           Time: 1216-1227 PT Time Calculation (min) (ACUTE ONLY): 11 min   Charges:   PT Evaluation $Initial PT Evaluation Tier I: 1 Procedure     PT G Codes:        Weston Anna, MPT Pager: [phone]

## 2015-07-31 LAB — BASIC METABOLIC PANEL
Anion gap: 2 — ABNORMAL LOW (ref 5–15)
BUN: 7 mg/dL (ref 6–20)
CALCIUM: 7.2 mg/dL — AB (ref 8.9–10.3)
CHLORIDE: 109 mmol/L (ref 101–111)
CO2: 29 mmol/L (ref 22–32)
CREATININE: 0.71 mg/dL (ref 0.44–1.00)
Glucose, Bld: 116 mg/dL — ABNORMAL HIGH (ref 65–99)
Potassium: 3.4 mmol/L — ABNORMAL LOW (ref 3.5–5.1)
SODIUM: 140 mmol/L (ref 135–145)

## 2015-07-31 LAB — PREPARE RBC (CROSSMATCH)

## 2015-07-31 LAB — CBC
HCT: 22.2 % — ABNORMAL LOW (ref 36.0–46.0)
HEMOGLOBIN: 7.1 g/dL — AB (ref 12.0–15.0)
MCH: 29.3 pg (ref 26.0–34.0)
MCHC: 32 g/dL (ref 30.0–36.0)
MCV: 91.7 fL (ref 78.0–100.0)
PLATELETS: 98 10*3/uL — AB (ref 150–400)
RBC: 2.42 MIL/uL — ABNORMAL LOW (ref 3.87–5.11)
RDW: 16.1 % — AB (ref 11.5–15.5)
WBC: 4.6 10*3/uL (ref 4.0–10.5)

## 2015-07-31 MED ORDER — ACETAMINOPHEN 325 MG PO TABS
650.0000 mg | ORAL_TABLET | Freq: Four times a day (QID) | ORAL | Status: DC | PRN
  Administered 2015-07-31: 650 mg via ORAL
  Filled 2015-07-31: qty 2

## 2015-07-31 MED ORDER — DIPHENHYDRAMINE HCL 25 MG PO CAPS
25.0000 mg | ORAL_CAPSULE | Freq: Four times a day (QID) | ORAL | Status: DC | PRN
  Administered 2015-07-31: 25 mg via ORAL
  Filled 2015-07-31: qty 1

## 2015-07-31 MED ORDER — SODIUM CHLORIDE 0.9 % IV SOLN: Freq: Once | INTRAVENOUS | Status: DC

## 2015-07-31 MED ORDER — KCL IN DEXTROSE-NACL 20-5-0.45 MEQ/L-%-% IV SOLN
INTRAVENOUS | Status: DC
  Administered 2015-08-01 – 2015-08-03 (×4): via INTRAVENOUS
  Filled 2015-07-31 (×10): qty 1000

## 2015-07-31 NOTE — Progress Notes (Signed)
Initial Nutrition Assessment  DOCUMENTATION CODES:   Severe malnutrition in context of acute illness/injury  INTERVENTION:   Diet advancement per MD  If patient unable to advance diet, recommend nutrition support (has been NPO/CLD/FLD x 12 days). RD to continue to monitor for plan  NUTRITION DIAGNOSIS:   Inadequate oral intake related to inability to eat (SBO ) as evidenced by NPO status.  GOAL:   Patient will meet greater than or equal to 90% of their needs  MONITOR:   Diet advancement, Labs, Weight trends, Skin, I & O's  REASON FOR ASSESSMENT:   NPO/Clear Liquid Diet, LOS    ASSESSMENT:   54 yo female with adenocarcinoma of the colon s/p en bloc resection with R colectomy, sigmoid colectomy now has been through multiple treatments of chemo and radiation, complicated by PE for which she was on coumadin until 1 month ago when she began having bleeding out her ostomy. She has undergone upper and lower endoscopy with findings of esophagitis but otherwise no culprit for this bleed. She was undergoing capsule endoscopy but the capsule has now stopped movement and there is concern for blockage. She continues to have maroon colored stools out her ostomy.   Pt asleep during RD visit. No family present at this time. Pt has remained NPO for 12 days with some days her diet advanced to clear or full liquids. Pt is NPO with NGT d/t SBO. Pt has lost 6 lb since admission (3% weight loss x 2 weeks, significant for time frame).  Recommend initiating nutrition support if patient's diet unable to be advanced.  Unable to perform nutrition focused physical exam.  Labs reviewed: Low K Mg/Phos WNL  Diet Order:  Diet NPO time specified Except for: Ice Chips  Skin:  Reviewed, no issues  Last BM:  9/27  Height:   Ht Readings from Last 1 Encounters:  07/27/15 5\' 8"  (1.727 m)    Weight:   Wt Readings from Last 1 Encounters:  07/31/15 201 lb 9 oz (91.428 kg)    Ideal Body Weight:   63.6 kg  BMI:  Body mass index is 30.65 kg/(m^2).  Estimated Nutritional Needs:   Kcal:  1700-1900  Protein:  90-100g  Fluid:  1.8L/day  EDUCATION NEEDS:   No education needs identified at this time  Clayton Bibles, MS, RD, LDN Pager: [phone] After Hours Pager: [phone]

## 2015-07-31 NOTE — Evaluation (Signed)
Occupational Therapy Evaluation Patient Details Name: Allison Mitchell MRN: [id number] DOB: 1961/10/11 Today's Date: 07/31/2015    History of Present Illness 54 y.o. female s/p lysis of adhesions, SB resection 07/27/15. Hx of colon cancer, R partial hemicolectomy, chemo induced peripheral neuropathy   Clinical Impression   Pt s/p above. Pt independent with ADLs, PTA. Feel pt will benefit from acute OT to increase independence and strength prior to d/c.    Follow Up Recommendations  No OT follow up;Supervision - Intermittent    Equipment Recommendations  Tub/shower seat    Recommendations for Other Services       Precautions / Restrictions Restrictions Weight Bearing Restrictions: No      Mobility Bed Mobility Overal bed mobility: Needs Assistance Bed Mobility: Supine to Sit;Sit to Supine     Supine to sit: Supervision Sit to supine: Supervision      Transfers Overall transfer level: Needs assistance   Transfers: Sit to/from Stand Sit to Stand: Supervision              Balance      Min guard for safety with ambulation.                                      ADL Overall ADL's : Needs assistance/impaired             Lower Body Bathing: Set up;Supervison/ safety;Sit to/from stand     Upper Body Dressing Details (indicate cue type and reason): assisted with gown-pt with line as she was receiving blood Lower Body Dressing: Set up;Supervision/safety;Sit to/from stand Lower Body Dressing Details (indicate cue type and reason): able to cross legs over knees with assistance of UEs Toilet Transfer: Min guard;Ambulation;BSC;Supervision/safety (sit to stand from bed and 3 in 1; supervision for transfers)   Toileting- Clothing Manipulation and Hygiene: Supervision/safety;Sit to/from stand;Set up       Functional mobility during ADLs: Min guard General ADL Comments: Pt interested in getting a shower chair-discussed options.  Mentioned use of  long sponge/loofah for LB bathing.     Vision     Perception     Praxis      Pertinent Vitals/Pain Pain Assessment: No/denies pain     Hand Dominance     Extremity/Trunk Assessment Upper Extremity Assessment Upper Extremity Assessment: Generalized weakness;Overall [facility] for tasks assessed   Lower Extremity Assessment Lower Extremity Assessment: Defer to PT evaluation       Communication Communication Communication: No difficulties   Cognition Arousal/Alertness: Awake/alert Behavior During Therapy: WFL for tasks assessed/performed Overall Cognitive Status: Within Functional Limits for tasks assessed                     General Comments       Exercises       Shoulder Instructions      Home Living Family/patient expects to be discharged to:: Private residence Living Arrangements: Spouse/significant other;Children Available Help at Discharge: Family Type of Home: House Home Access: Level entry     [place]: One level;Able to live on main level with bedroom/bathroom     Bathroom Shower/Tub: Occupational psychologist: Standard (sink close)     Home Equipment: None          Prior Functioning/Environment Level of Independence: Independent             OT Diagnosis: Generalized weakness   OT Problem List:  Decreased strength;Decreased knowledge of use of DME or AE   OT Treatment/Interventions: Self-care/ADL training;Therapeutic exercise;DME and/or AE instruction;Therapeutic activities;Patient/family education;Balance training    OT Goals(Current goals can be found in the care plan section) Acute Rehab OT Goals Patient Stated Goal: not stated OT Goal Formulation: With patient Time For Goal Achievement: 08/07/15 Potential to Achieve Goals: Good ADL Goals Pt Will Perform Lower Body Dressing: with modified independence;sit to/from stand (including gathering supplies) Pt Will Transfer to Toilet: with modified independence;regular  height toilet;ambulating (grab bar) Additional ADL Goal #1: Pt will independently perform HEP for bilateral UEs.  OT Frequency: Min 2X/week   Barriers to D/C:            Co-evaluation              End of Session Equipment Utilized During Treatment: Gait belt Nurse Communication: Other (comment) (pt leaking fluid)  Activity Tolerance: Patient tolerated treatment well;Other (comment) (leakage from abdominal wound site) Patient left: in bed;with call bell/phone within reach;with nursing/sitter in room   Time: [phone] (time spent getting socks and nurse addressing leakage from abdominal site) OT Time Calculation (min): 25 min Charges:  OT General Charges $OT Visit: 1 Procedure OT Evaluation $Initial OT Evaluation Tier I: 1 Procedure G-CodesBenito Mccreedy OTR/L [phone] 07/31/2015, 4:29 PM

## 2015-07-31 NOTE — Progress Notes (Signed)
OT Cancellation Note  Patient Details Name: Sreeja Spies MRN: [id number] DOB: 1961-09-28   Cancelled Treatment:    Reason Eval/Treat Not Completed: Other (comment) Spoke with nurse and pt is currently getting blood and she recommend OT hold for now.  Benito Mccreedy OTR/L [phone] 07/31/2015, 12:22 PM

## 2015-07-31 NOTE — Progress Notes (Signed)
PROGRESS NOTE  Allison Mitchell YPP:[id number] DOB: 1961/02/06 DOA: 07/19/2015 PCP: [name], MD  HPI: Allison Mitchell is a 54 y.o white female who presents to the unit following several weeks of maroon-colored watery stool in her ostomy bag. Recent endoscopy (upper and lower) per Dr. Burr Medico unable to reveal source. Patient admitted for further workup of GI bleed. Initally laboratory revealed a hemoglobin of 7.4, which required a transfusion. Throughout her hospital course thus far, multiple transfusions have been required due to decreasing hemoglobin. RBC scan, capsule study, and Meckel's unable to find etiology. PMH includes stage III colon cancer s/p right hemicolectomy on 10/10/14 by Dr. Hulen Skains.   Patient continued to have bleeding requiring several transfusions and she underwent a capsule study as well, which resulted in capsule being stuck. Patient was eventually taken to the OR on 9/23 for exlap for probable small bowel obstruction.   Subjective / 24 H Interval events Today the patient is doing well. She is able to pass gas and has seen contents in her colostomy bag. Denies pain, fever, chills, nausea, or vomiting. Denies lightheadedness or fatigue. Able to ambulate without complications.   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 She underwent an RBC scan, negative. She had a Meckels study, negative. She had a capsule study with the capsule never retrieved (!). She has a CT enterogram which showed possible SBO and then she was taken to OR. In addition she had endoscopies as an outpatient.  - she is now S/p LOA and SB resection 9/23, extensive surgery with no bleeding etiology found. I am unclear about the fate of the capsule since it was not in the removed bowel, nor inside the patient's abdomen post op.  - she is improving post op, in the first 2 days patient without any ostomy output, picking up on 9/26. NG tube removed 9/27. Biggest concern now is that the contents of the output are dark red, which may be related to surgery vs unresolved GI bleeding. Monitor output and CBC.   Right hydroureteronephrosis - This is acute on chronic - Denies flank tenderness and urinary symptoms  -Dr. Tresa Moore consulted, will observe for now, consider stent replacement if GFR declines or patient starts nephrotoxic chemo again  - Cr WNL (.71) today with increase fluids. Monitor- If worsening, consider renal US to reevaluate hydronephrosis  HTN - Secondary to surgical pain, stable at 137/65 today - PRNs available. Continue NPO  Stage III Colon Cancer  - S/p right hemicolectomy with current colostomy  - Managed at the cancer center w/ chemo and radiation therapy, currently being considered for cytoreductive surgery and HIPEC at [place] following, chemo on hold for now given #  1  CKD stage II - Cr continues to be stable, monitor  Chemotherapy induced pancytopenia - Remains stable, closely monitor platelets/WBC  - Leukocytosis resolved, Plt improving, Hgb continues to decrease, suspected secondary to ABL - Transfused 2U PRBC today, consider Plt transfusion if remains < 100 w/ continued bleeding   Hypokalemia - Initially resolved,  but decreased today (3.4), patient still NPO  - Monitor and replete PRN   Hyponatremia - Resolved    Diet: Diet NPO Fluids: NS DVT Prophylaxis: SCD  Code Status: Full Code Family Communication: discussed with husband last night Disposition Plan: home when ready   Consultants:  GI   Oncology   Surgery   Urology  Procedures:  RBC scan   Capsule study   Meckel scan   LOA & SB resection 07/27/2015  Antibiotics Ciprofloxacin - peri operatively Metronidazole - peri operatively  Studies  No results found.  Objective  Filed Vitals:   07/31/15 0712 07/31/15 0900 07/31/15 0955 07/31/15 1029  BP:  132/54 132/54 137/65  Pulse:  86 86 87  Temp:  98.3 F (36.8 C) 98.3 F (36.8 C) 99.1 F (37.3 C)  TempSrc:  Oral Oral Oral  Resp:  18 18 17   Height:      Weight: 91.428 kg (201 lb 9 oz)     SpO2:  99% 99% 100%    Intake/Output Summary (Last 24 hours) at 07/31/15 1034 Last data filed at 07/31/15 1026  Gross per 24 hour  Intake   2720 ml  Output   3535 ml  Net   -815 ml   Filed Weights   07/29/15 1000 07/30/15 0650 07/31/15 0712  Weight: 94.7 kg (208 lb 12.4 oz) 93.396 kg (205 lb 14.4 oz) 91.428 kg (201 lb 9 oz)    Exam:  GENERAL: Well-appearing woman, sitting up in bed with no acute distress, NG tube out  HEENT: head NCAT. Mucous membranes are moist  NECK: Supple.   LUNGS: Clear to auscultation. No wheezing or crackles  HEART: Regular rate and rhythm, 2/6 systolic ejection murmur at LUSB. No JVD or peripheral edema  ABDOMEN: Mid-line Incision remains without erythema, edema, induration, warmth or drainage. Colostomy bag in LLQ with maroon-colored discharge. Drain in RLQ with sero-sanquinous fluid, full. Soft, nontender, and nondistended. + Bowel sounds  EXTREMITIES: Without any cyanosis, clubbing, rash, lesions or edema  NEUROLOGIC: Alert and oriented x3. No focal neurological deficits   PSYCHIATRIC: Affect improving, normal speech and  behavior   SKIN: No ulceration or induration present. See abdomen for surgical incision information  Data Reviewed: Basic Metabolic Panel:  Recent Labs Lab 07/27/15 0545 07/27/15 2107 07/29/15 0520 07/30/15 0525 07/31/15 0606  NA 135 136 135 138 140  K 3.4* 3.9 4.5 3.6 3.4*  CL 102 104 103 104 109  CO2 27 24 25 28 29   GLUCOSE 118* 265* 157* 125* 116*  BUN 9 9 15 11 7   CREATININE 1.06* 0.98 1.37* 0.92 0.71  CALCIUM 7.5* 7.2* 7.7* 7.5* 7.2*  MG  --  1.6*  --  2.2  --   PHOS  --  4.6  --  2.5  --    Liver Function Tests:  Recent Labs Lab 07/26/15 0540 07/29/15 0520 07/30/15 0525  AST 17 19 16   ALT 8* 9* 7*  ALKPHOS 151* 113 91  BILITOT 0.6 0.4 0.5  PROT 4.6* 5.1* 4.4*  ALBUMIN 2.1* 2.2* 2.0*   CBC:  Recent Labs Lab 07/27/15 0545 07/27/15 2107 07/29/15 0520 07/30/15 0525 07/31/15  0606  WBC 4.0 12.3* 14.0* 6.0 4.6  NEUTROABS  --  11.7*  --   --   --   HGB 7.3* 9.9* 9.0* 7.5* 7.1*  HCT 22.0* 29.9* 28.0* 23.2* 22.2*  MCV 88.7 88.7 90.3 89.9 91.7  PLT 108* 132* 201 94* 98*   Recent Results (from the past 240 hour(s))  Surgical pcr screen     Status: None   Collection Time: 07/27/15 12:06 PM  Result Value Ref Range Status   MRSA, PCR NEGATIVE NEGATIVE Final   Staphylococcus aureus NEGATIVE NEGATIVE Final    Comment:        The Xpert SA Assay (FDA approved for NASAL specimens in patients over 59 years of age), is one component of a comprehensive surveillance program.  Test performance has been validated by [facility] for patients greater than or equal to 30 year old. It is not intended to diagnose infection nor to guide or monitor treatment.      Scheduled Meds: . sodium chloride   Intravenous Once  . antiseptic oral rinse  7 mL Mouth Rinse BID  . lip balm  1 application Topical BID  . metoprolol  5 mg Intravenous 4 times per day  . pantoprazole (PROTONIX) IV  40 mg Intravenous QHS  . zolpidem  5 mg Oral QHS   Continuous Infusions: .  dextrose 5 % and 0.45 % NaCl with KCl 20 mEq/L      Micayla Zeltman, Student-PA  Marzetta Board, MD [name] Pager [phone]. If 7 PM - 7 AM, please contact night-coverage at [email or web address], password [facility] 07/31/2015, 10:34 AM  LOS: 12 days

## 2015-07-31 NOTE — Progress Notes (Addendum)
4 Days Post-Op  Subjective: wants the NG out.  Her H/H is down.  Abd pain better  Objective: Vital signs in last 24 hours: Temp:  [98.4 F (36.9 C)-99 F (37.2 C)] 98.8 F (37.1 C) (09/27 0558) Pulse Rate:  [82-87] 84 (09/27 0558) Resp:  [16-18] 16 (09/27 0558) BP: (117-129)/(60) 117/60 mmHg (09/27 0558) SpO2:  [96 %-100 %] 96 % (09/27 0558) Weight:  [91.428 kg (201 lb 9 oz)] 91.428 kg (201 lb 9 oz) (09/27 0712) Last BM Date: 07/30/15 (mostly blood in ostomy)  Intake/Output from previous day: 09/26 0701 - 09/27 0700 In: 3000 [I.V.:2400; NG/GT:240] Out: 3335 [Urine:1700; Emesis/NG output:950; Drains:685] Intake/Output this shift: Total I/O In: 240 [Other:120; NG/GT:120] Out: 360 [Urine:300; Drains:60]  General appearance: alert, cooperative, no distress, pale and tired. GI: tender, no BS, the ostomy has dark bloody stool, no gas in the ostomy bag.  Lab Results:   Recent Labs  07/30/15 0525 07/31/15 0606  WBC 6.0 4.6  HGB 7.5* 7.1*  HCT 23.2* 22.2*  PLT 94* 98*    BMET  Recent Labs  07/30/15 0525 07/31/15 0606  NA 138 140  K 3.6 3.4*  CL 104 109  CO2 28 29  GLUCOSE 125* 116*  BUN 11 7  CREATININE 0.92 0.71  CALCIUM 7.5* 7.2*   PT/INR No results for input(s): LABPROT, INR in the last 72 hours.   Recent Labs Lab 07/26/15 0540 07/29/15 0520 07/30/15 0525  AST 17 19 16   ALT 8* 9* 7*  ALKPHOS 151* 113 91  BILITOT 0.6 0.4 0.5  PROT 4.6* 5.1* 4.4*  ALBUMIN 2.1* 2.2* 2.0*     Lipase  No results found for: LIPASE   Studies/Results: No results found.  Medications: . sodium chloride   Intravenous Once  . antiseptic oral rinse  7 mL Mouth Rinse BID  . lip balm  1 application Topical BID  . metoprolol  5 mg Intravenous 4 times per day  . pantoprazole (PROTONIX) IV  40 mg Intravenous QHS  . zolpidem  5 mg Oral QHS   . dextrose 5 % and 0.45 % NaCl with KCl 20 mEq/L     Prior to Admission medications   Medication Sig Start Date End Date Taking?  Authorizing Provider  acetaminophen (TYLENOL) 325 MG tablet Take 325 mg by mouth every 6 (six) hours as needed for moderate pain.   Yes Historical Provider, MD  citalopram (CELEXA) 20 MG tablet Take 20 mg by mouth every morning.  09/18/14  Yes Historical Provider, MD  lidocaine-prilocaine (EMLA) cream Apply 1 application topically as needed. Patient taking differently: Apply 1 application topically as needed (port).  11/16/14  Yes Truitt Merle, MD  magnesium hydroxide (MILK OF MAGNESIA) 400 MG/5ML suspension Take 5 mLs by mouth daily as needed for mild constipation.   Yes Historical Provider, MD  omeprazole (PRILOSEC) 40 MG capsule Take 40 mg by mouth daily.   Yes Historical Provider, MD  ondansetron (ZOFRAN) 8 MG tablet Take 1 tablet (8 mg total) by mouth every 8 (eight) hours as needed for nausea or vomiting. 01/04/15  Yes Truitt Merle, MD  oxyCODONE (OXY IR/ROXICODONE) 5 MG immediate release tablet Take 1 tablet (5 mg total) by mouth every 6 (six) hours as needed for severe pain. 05/16/15  Yes Truitt Merle, MD  pantoprazole (PROTONIX) 40 MG tablet Take 1 tablet (40 mg total) by mouth 2 (two) times daily. 06/14/15  Yes Truitt Merle, MD  potassium chloride SA (K-DUR,KLOR-CON) 20 MEQ tablet Take  1 tablet (20 mEq total) by mouth daily. Take as directed. 06/28/15  Yes Truitt Merle, MD  PREVIDENT 5000 BOOSTER PLUS 1.1 % PSTE 1 application by Other route daily.  03/20/15  Yes Historical Provider, MD  zolpidem (AMBIEN) 5 MG tablet Take 1 tablet (5 mg total) by mouth at bedtime as needed for sleep. 05/31/15  Yes Truitt Merle, MD     Assessment/Plan GI bleed, small bowel obstruction, adenocarcinoma of the colon   S/p lysis of adhesions, small bowel resection with anastomosis, 07/27/15, Dr. 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 Doreen Salvage Chemotherapy and radiation therapy Hydronephrosis of right kidney with h/o metastatic cancer near ureter Anemia and neuropathy associated with chemotherapy Hx of PE Anxiety/Depression GERD Anemia post op Antibiotics: None DVT: No heparin secondary to bleeding/SCD's added  Plan:  Stool in ostomy bag.  D/C NG.  Follow hgb.  Work on getting her up more and mobilizing her more.  Cont NPO and IV fluids.    LOS: 12 days    Allison Mitchell, Allison C. [birth year]

## 2015-08-01 DIAGNOSIS — N1330 Unspecified hydronephrosis: Secondary | ICD-10-CM

## 2015-08-01 DIAGNOSIS — N182 Chronic kidney disease, stage 2 (mild): Secondary | ICD-10-CM

## 2015-08-01 DIAGNOSIS — D61810 Antineoplastic chemotherapy induced pancytopenia: Secondary | ICD-10-CM

## 2015-08-01 DIAGNOSIS — E43 Unspecified severe protein-calorie malnutrition: Secondary | ICD-10-CM

## 2015-08-01 DIAGNOSIS — C189 Malignant neoplasm of colon, unspecified: Secondary | ICD-10-CM

## 2015-08-01 DIAGNOSIS — E876 Hypokalemia: Secondary | ICD-10-CM

## 2015-08-01 DIAGNOSIS — T189XXS Foreign body of alimentary tract, part unspecified, sequela: Secondary | ICD-10-CM

## 2015-08-01 DIAGNOSIS — T451X5A Adverse effect of antineoplastic and immunosuppressive drugs, initial encounter: Secondary | ICD-10-CM

## 2015-08-01 DIAGNOSIS — Z933 Colostomy status: Secondary | ICD-10-CM

## 2015-08-01 DIAGNOSIS — K5669 Other intestinal obstruction: Secondary | ICD-10-CM

## 2015-08-01 DIAGNOSIS — I10 Essential (primary) hypertension: Secondary | ICD-10-CM

## 2015-08-01 LAB — BASIC METABOLIC PANEL
ANION GAP: 6 (ref 5–15)
BUN: 7 mg/dL (ref 6–20)
CALCIUM: 7.4 mg/dL — AB (ref 8.9–10.3)
CO2: 26 mmol/L (ref 22–32)
Chloride: 108 mmol/L (ref 101–111)
Creatinine, Ser: 0.87 mg/dL (ref 0.44–1.00)
Glucose, Bld: 104 mg/dL — ABNORMAL HIGH (ref 65–99)
Potassium: 3.7 mmol/L (ref 3.5–5.1)
SODIUM: 140 mmol/L (ref 135–145)

## 2015-08-01 LAB — CBC
HEMATOCRIT: 27.9 % — AB (ref 36.0–46.0)
Hemoglobin: 9.1 g/dL — ABNORMAL LOW (ref 12.0–15.0)
MCH: 29.4 pg (ref 26.0–34.0)
MCHC: 32.6 g/dL (ref 30.0–36.0)
MCV: 90.3 fL (ref 78.0–100.0)
PLATELETS: 90 10*3/uL — AB (ref 150–400)
RBC: 3.09 MIL/uL — ABNORMAL LOW (ref 3.87–5.11)
RDW: 15.7 % — AB (ref 11.5–15.5)
WBC: 3.2 10*3/uL — AB (ref 4.0–10.5)

## 2015-08-01 LAB — TYPE AND SCREEN
ABO/RH(D): A POS
ANTIBODY SCREEN: NEGATIVE
Unit division: 0
Unit division: 0

## 2015-08-01 NOTE — Progress Notes (Signed)
Physical Therapy Treatment Patient Details Name: Allison Mitchell MRN: [id number] DOB: 03-Dec-1960 Today's Date: 08/01/2015    History of Present Illness 54 y.o. female s/p lysis of adhesions, SB resection 07/27/15. Hx of colon cancer, R partial hemicolectomy, chemo induced peripheral neuropathy    PT Comments    Assisted with draining tube and colostomy.  Assisted with getting OOB with increased time.  Pt c/o mild dizziness/drunkness (just took pain meds).  Used RW this time for increased safety to amb around unit.  Tolerated well.  Assisted back to bed per pt request.   Follow Up Recommendations  No PT follow up;Supervision - Intermittent     Equipment Recommendations  None recommended by PT    Recommendations for Other Services       Precautions / Restrictions Precautions Precautions: None Precaution Comments: abdominal surgery, multiple lines Restrictions Weight Bearing Restrictions: No    Mobility  Bed Mobility Overal bed mobility: Modified Independent Bed Mobility: Supine to Sit;Sit to Supine           General bed mobility comments: increased time and use of rail  Transfers Overall transfer level: Needs assistance Equipment used: None Transfers: Sit to/from Stand           General transfer comment: for safety with multiple lines/drains  Ambulation/Gait Ambulation/Gait assistance: Supervision;Min guard Ambulation Distance (Feet): 475 Feet Assistive device: Rolling walker (2 wheeled)       General Gait Details: used RW for increased safety/comfort/pain med drunkness.     Stairs            Wheelchair Mobility    Modified Rankin (Stroke Patients Only)       Balance                                    Cognition Arousal/Alertness: Awake/alert Behavior During Therapy: WFL for tasks assessed/performed Overall Cognitive Status: Within Functional Limits for tasks assessed                      Exercises      General  Comments        Pertinent Vitals/Pain Pain Assessment: 0-10 Pain Score: 3  Pain Location: ABD Pain Descriptors / Indicators: Discomfort Pain Intervention(s): Monitored during session;Premedicated before session;Repositioned    Home Living                      Prior Function            PT Goals (current goals can now be found in the care plan section) Progress towards PT goals: Progressing toward goals    Frequency  Min 3X/week    PT Plan      Co-evaluation             End of Session Equipment Utilized During Treatment: Gait belt Activity Tolerance: Patient tolerated treatment well Patient left: in bed;with call bell/phone within reach;with family/visitor present     Time: 1450-1515 PT Time Calculation (min) (ACUTE ONLY): 25 min  Charges:  $Gait Training: 8-22 mins $Therapeutic Activity: 8-22 mins                    G Codes:      Rica Koyanagi  PTA [facility]  Acute  Rehab Pager      [phone]

## 2015-08-01 NOTE — Progress Notes (Addendum)
Patient ID: Allison Mitchell, female   DOB: [birth year], 54 y.o.   MRN: [id number] [name] PROGRESS NOTE  Allison Mitchell [id number]:[id number] DOB: [birth year] DOA: 07/19/2015 PCP: [name], MD  Brief narrative:    54 year old female with past medical history of depression, GERD, stage 3 colon cancer status post right hemicolectomy on 10/10/2014 by Dr. Hulen Skains.. She presented to [facility] ED with reports of maroon colored watery stool in ostomy bag. She was admitted for upper GI bleed. Her initial hemoglobin was 7.4. She has had RBC scan, capsule study and Meckel's all with unclear source of bleed. She has required multiple units of blood during this hospital stay. Patient was eventually taken to the OR on 9/23 for exploratory laparotomy for probable small bowel obstruction.  Patient is s/p lysis of adhesions, small bowel resection with anastomosis, 07/27/15, Dr. Gurney Maxin.   Assessment/Plan:    Principal Problem: Acute blood loss anemia due to occult GI bleeding in the setting of SBO, s/p LOA and SB resection on 9/23 - RBC scan, Meckel's study all negative. - Had capsule endoscopy, capsule never retrieved - Has received multiple blood transfusions during this hospital stay. (2U pRBC 9/15, 2U 9/16 and 2U 9/21, 2U 9/23, 2U 9/27). - Has received platelets on 9/23 and FFP x 2 on 9/23 intraoperatively - Developed SBO. Pt is s/p LOA and small bowel resection 9/23.  - Now on clear liquids.  Active Problems: Right hydroureteronephrosis -Dr. Tresa Moore was consulted and recommended stent replacement if GFR declines or patient starts nephrotoxic chemo again   Essential hypertension  - Continue IV metoprolol.  Multifocal stage IIIB colon cancer, MMR normal, MSI stable, biopsy confirmed liver and probable peritoneal metastases, KRAS mutated  - S/p right hemicolectomy with colostomy  - Chemo on hold  - Follows with Dr. Burr Medico on oncology   History of PE - Used to be on Lovenox then on coumadin  - Not  on [facility] due to risk of bleed   CKD stage 2 - Stable   Chemotherapy induced pancytopenia - Daily CBC checks - Stable   Hypokalemia - Secondary to GI losses - Potassium supplemented  Hyponatremia - Secondary to prerenal causes, dehydration, GI losses - Sodium normalized with hydration   Severe protein calorie malnutrition - In the context of chronic illness - Advance diet per surgery    DVT Prophylaxis  - SCD's bilaterally    Code Status: Full.  Family Communication:  plan of care discussed with the patient and her husband at the bedside  Disposition Plan: by 10/1 if no further bleed   IV access:  Peripheral IV  Procedures and diagnostic studies:    RBC scan  Capsule study  Meckel scan  LOA & SB resection 07/27/2015  Medical Consultants:  GI  Oncology  Surgery  Urology  IAnti-Infectives:   Ciprofloxacin - peri operatively Metronidazole - peri operatively   Leisa Lenz, MD  [name] Pager [phone]  Time spent in minutes: 25 minutes  If 7PM-7AM, please contact night-coverage [email or web address] Password [email or web address] 08/01/2015, 1:31 PM   LOS: 13 days    HPI/Subjective: No acute overnight events. Patient reports feeling tired, hungry.  Objective: Filed Vitals:   08/01/15 0500 08/01/15 0538 08/01/15 0953 08/01/15 1314  BP:  127/55 136/66 134/68  Pulse:  77 71 75  Temp:  98.4 F (36.9 C) 98.4 F (36.9 C)   TempSrc:  Oral Oral   Resp:  16 14   Height:      Weight: 91.5 kg (  201 lb 11.5 oz)     SpO2:  94% 98%     Intake/Output Summary (Last 24 hours) at 08/01/15 1331 Last data filed at 08/01/15 1216  Gross per 24 hour  Intake   1502 ml  Output   2956 ml  Net  -1454 ml    Exam:   General:  Pt is alert, no distress  Cardiovascular: RRR, S1/S2 (+)  Respiratory: Clear to auscultation, no wheezing   Abdomen: appreciate bowel sounds, JP (+) in place, colostomy in place  Extremities: No edema, palpable pulses   Neuro: Nonfocal    Data Reviewed: Basic Metabolic Panel:  Recent Labs Lab 07/27/15 2107 07/29/15 0520 07/30/15 0525 07/31/15 0606 08/01/15 0525  NA 136 135 138 140 140  K 3.9 4.5 3.6 3.4* 3.7  CL 104 103 104 109 108  CO2 _0 GLUCOSE 265* 157* 125* 116* 104*  BUN _1 CREATININE 0.98 1.37* 0.92 0.71 0.87  CALCIUM 7.2* 7.7* 7.5* 7.2* 7.4*  MG 1.6*  --  2.2  --   --   PHOS 4.6  --  2.5  --   --    Liver Function Tests:  Recent Labs Lab 07/26/15 0540 07/29/15 0520 07/30/15 0525  AST _2 ALT 8* 9* 7*  ALKPHOS 151* 113 91  BILITOT 0.6 0.4 0.5  PROT 4.6* 5.1* 4.4*  ALBUMIN 2.1* 2.2* 2.0*   No results for input(s): LIPASE, AMYLASE in the last 168 hours. No results for input(s): AMMONIA in the last 168 hours. CBC:  Recent Labs Lab 07/27/15 2107 07/29/15 0520 07/30/15 0525 07/31/15 0606 08/01/15 0525  WBC 12.3* 14.0* 6.0 4.6 3.2*  NEUTROABS 11.7*  --   --   --   --   HGB 9.9* 9.0* 7.5* 7.1* 9.1*  HCT 29.9* 28.0* 23.2* 22.2* 27.9*  MCV 88.7 90.3 89.9 91.7 90.3  PLT 132* 201 94* 98* 90*   Cardiac Enzymes: No results for input(s): CKTOTAL, CKMB, CKMBINDEX, TROPONINI in the last 168 hours. BNP: Invalid input(s): POCBNP CBG: No results for input(s): GLUCAP in the last 168 hours.  Recent Results (from the past 240 hour(s))  Surgical pcr screen     Status: None   Collection Time: 07/27/15 12:06 PM  Result Value Ref Range Status   MRSA, PCR NEGATIVE NEGATIVE Final   Staphylococcus aureus NEGATIVE NEGATIVE Final     Scheduled Meds: . metoprolol  5 mg Intravenous 4 times per day  . pantoprazole (PROTONIX) IV  40 mg Intravenous QHS  . zolpidem  5 mg Oral QHS   Continuous Infusions: . dextrose 5 % and 0.45 % NaCl with KCl 20 mEq/L 75 mL/hr at 08/01/15 0414

## 2015-08-01 NOTE — Progress Notes (Signed)
Occupational Therapy Treatment Patient Details Name: Allison Mitchell MRN: [id number] DOB: 12-Jul-1961 Today's Date: 08/01/2015    History of present illness 54 y.o. female s/p lysis of adhesions, SB resection 07/27/15. Hx of colon cancer, R partial hemicolectomy, chemo induced peripheral neuropathy   OT comments  Discussed use of Ae further for self care and home management but pt states she is currently not interested in AE as she states daughter can help with home management tasks and pt is able to cross LEs up for Lb bath/dress. She would like the shower seat. Pt moving well and feel she is close to meeting goals.    Follow Up Recommendations  No OT follow up;Supervision - Intermittent    Equipment Recommendations  Tub/shower seat    Recommendations for Other Services      Precautions / Restrictions Precautions Precautions: None Precaution Comments: abdominal surgery, multiple lines Restrictions Weight Bearing Restrictions: No       Mobility Bed Mobility Overal bed mobility: Modified Independent         Sit to supine: Modified independent (Device/Increase time)      Transfers Overall transfer level: Modified independent   Transfers: Sit to/from Stand                Balance  pt stood to wash periarea without supervision and no LOB.                                 ADL               Lower Body Bathing: Supervison/ safety;Sit to/from stand                         General ADL Comments: Pt still interested in shower chair and able to cross LEs up today to be able to wash feet and don socks. She states she is not interested in AE at this time. Did discuss use of reacher to help with pulling laundry out of washer and place in dryer but pt states her daughter will help with household tasks. Today pt was able to reach up into cabinets above sink to retrieve washclothes/soap for herself without any difficulty and supervision. Bandage on  abdomen starting to pull away so reinforced with piece of paper tape and informed nursing. Will benefit from HEP for UEs to help wtih strengthening/activity tolerance and likely able to d/c from OT services.       Vision                     Perception     Praxis      Cognition   Behavior During Therapy: WFL for tasks assessed/performed Overall Cognitive Status: Within Functional Limits for tasks assessed                       Extremity/Trunk Assessment               Exercises     Shoulder Instructions       General Comments      Pertinent Vitals/ Pain       Pain Assessment: 0-10 Pain Score: 3  Pain Location: abdomen Pain Descriptors / Indicators: Sore Pain Intervention(s): Repositioned;Monitored during session  Home Living  Prior Functioning/Environment              Frequency Min 2X/week     Progress Toward Goals  OT Goals(current goals can now be found in the care plan section)  Progress towards OT goals: Progressing toward goals     Plan Discharge plan remains appropriate    Co-evaluation                 End of Session     Activity Tolerance Patient tolerated treatment well   Patient Left in bed;with call bell/phone within reach   Nurse Communication          Time: 1130-1146 OT Time Calculation (min): 16 min  Charges: OT General Charges $OT Visit: 1 Procedure OT Treatments $Self Care/Home Management : 8-22 mins  Allison Mitchell  [phone] 08/01/2015, 12:02 PM

## 2015-08-01 NOTE — Progress Notes (Signed)
5 Days Post-Op  Subjective: She actually looks better this AM.  She is tired, no nausea with NG out, she has at least a 1/2 bag of drainage from the colostomy.  It still looks somewhat like old bloody drainage.  Objective: Vital signs in last 24 hours: Temp:  [97 F (36.1 C)-99.1 F (37.3 C)] 98.4 F (36.9 C) (09/28 0953) Pulse Rate:  [71-87] 71 (09/28 0953) Resp:  [14-18] 14 (09/28 0953) BP: (95-137)/(46-66) 136/66 mmHg (09/28 0953) SpO2:  [94 %-100 %] 98 % (09/28 0953) Weight:  [91.5 kg (201 lb 11.5 oz)] 91.5 kg (201 lb 11.5 oz) (09/28 0500) Last BM Date: 08/01/15 Nothing PO  Drains:  820 ml Colostomy -  LLQ  325 ml Afebrile, VSS Labs OK Intake/Output from previous day: 09/27 0701 - 09/28 0700 In: 44 [I.V.:900; Blood:616; NG/GT:120] Out: 3146 [Urine:2001; Drains:820; Stool:325] Intake/Output this shift: Total I/O In: -  Out: 180 [Drains:180]  General appearance: alert, cooperative and no distress Resp: clear to auscultation bilaterally and anterior GI: tender, few BS, sites ok, drainage is serous, + drainage from the colostomy.  Lab Results:   Recent Labs  07/31/15 0606 08/01/15 0525  WBC 4.6 3.2*  HGB 7.1* 9.1*  HCT 22.2* 27.9*  PLT 98* 90*    BMET  Recent Labs  07/31/15 0606 08/01/15 0525  NA 140 140  K 3.4* 3.7  CL 109 108  CO2 29 26  GLUCOSE 116* 104*  BUN 7 7  CREATININE 0.71 0.87  CALCIUM 7.2* 7.4*   PT/INR No results for input(s): LABPROT, INR in the last 72 hours.   Recent Labs Lab 07/26/15 0540 07/29/15 0520 07/30/15 0525  AST 17 19 16   ALT 8* 9* 7*  ALKPHOS 151* 113 91  BILITOT 0.6 0.4 0.5  PROT 4.6* 5.1* 4.4*  ALBUMIN 2.1* 2.2* 2.0*     Lipase  No results found for: LIPASE   Studies/Results: No results found.  Medications: . sodium chloride   Intravenous Once  . antiseptic oral rinse  7 mL Mouth Rinse BID  . lip balm  1 application Topical BID  . metoprolol  5 mg Intravenous 4 times per day  . pantoprazole  (PROTONIX) IV  40 mg Intravenous QHS  . zolpidem  5 mg Oral QHS    Assessment/Plan GI bleed, small bowel obstruction, adenocarcinoma of the colon  S/p lysis of adhesions, small bowel resection with anastomosis, 07/27/15, Dr. Gurney Maxin POD 5 Probable carcinomatosis on CT scan  Stage III colon cancer (adenocarcinoma) S/p en bloc resection with right colectomy, sigmoid colectomy, colostomy revision, , ureteral reimplantation on the right, 10/10/14, Dr. Doreen Salvage Chemotherapy and radiation therapy Hydronephrosis of right kidney with h/o metastatic cancer near ureter Anemia and neuropathy associated with chemotherapy Hx of PE Anxiety/Depression GERD Anemia post op Antibiotics: None DVT: No heparin secondary to bleeding/SCD's added    Plan:  Clear liquids, mobilize more and recheck labs in AM.     LOS: 13 days    Allison Mitchell,Allison Mitchell 08/01/2015

## 2015-08-02 DIAGNOSIS — I2699 Other pulmonary embolism without acute cor pulmonale: Secondary | ICD-10-CM

## 2015-08-02 LAB — CBC
HCT: 30.9 % — ABNORMAL LOW (ref 36.0–46.0)
HEMOGLOBIN: 9.9 g/dL — AB (ref 12.0–15.0)
MCH: 28.6 pg (ref 26.0–34.0)
MCHC: 32 g/dL (ref 30.0–36.0)
MCV: 89.3 fL (ref 78.0–100.0)
Platelets: 105 10*3/uL — ABNORMAL LOW (ref 150–400)
RBC: 3.46 MIL/uL — AB (ref 3.87–5.11)
RDW: 15.3 % (ref 11.5–15.5)
WBC: 6.8 10*3/uL (ref 4.0–10.5)

## 2015-08-02 MED ORDER — LIDOCAINE HCL (PF) 1 % IJ SOLN
30.0000 mL | Freq: Once | INTRAMUSCULAR | Status: AC
  Administered 2015-08-02: 30 mL via INTRADERMAL
  Filled 2015-08-02: qty 30

## 2015-08-02 MED ORDER — OXYCODONE-ACETAMINOPHEN 5-325 MG PO TABS
1.0000 | ORAL_TABLET | ORAL | Status: DC | PRN
  Administered 2015-08-02: 2 via ORAL
  Filled 2015-08-02: qty 2

## 2015-08-02 NOTE — Progress Notes (Signed)
6 Days Post-Op  Subjective: She looks fairly good this AM, her H/H is better, not allot of recorded drainage.  I told her we would get her drain out today, but I am going to get a suture set up to floor and some suture to do it.  Objective: Vital signs in last 24 hours: Temp:  [98.3 F (36.8 C)-98.9 F (37.2 C)] 98.9 F (37.2 C) (09/29 0830) Pulse Rate:  [71-79] 79 (09/29 0830) Resp:  [14-16] 16 (09/29 0830) BP: (120-140)/(47-68) 125/62 mmHg (09/29 0830) SpO2:  [98 %-99 %] 98 % (09/29 0830) Weight:  [89.359 kg (197 lb)] 89.359 kg (197 lb) (09/29 0700) Last BM Date: 08/02/15 60 ml PO reocrded Drain:  1230 ml drained. Stool:  200 recorded Diet:  Clears Afebrile, VSS CBC stable H/H is trending up  Intake/Output from previous day: 09/28 0701 - 09/29 0700 In: 1860 [P.O.:60; I.V.:1800] Out: 3480 [Urine:2050; Drains:1230; Stool:200] Intake/Output this shift: Total I/O In: 124.3 [I.V.:124.3] Out: 55 [Drains:55]  General appearance: alert, cooperative, no distress and anxious about possible suture and staple removal.  Resp: clear to auscultation bilaterally GI: soft, sore, incision looks good.  She still has dark liquid coming from ostomy, does not look as bloody as before.  Lab Results:   Recent Labs  08/01/15 0525 08/02/15 0542  WBC 3.2* 6.8  HGB 9.1* 9.9*  HCT 27.9* 30.9*  PLT 90* 105*    BMET  Recent Labs  07/31/15 0606 08/01/15 0525  NA 140 140  K 3.4* 3.7  CL 109 108  CO2 29 26  GLUCOSE 116* 104*  BUN 7 7  CREATININE 0.71 0.87  CALCIUM 7.2* 7.4*   PT/INR No results for input(s): LABPROT, INR in the last 72 hours.   Recent Labs Lab 07/29/15 0520 07/30/15 0525  AST 19 16  ALT 9* 7*  ALKPHOS 113 91  BILITOT 0.4 0.5  PROT 5.1* 4.4*  ALBUMIN 2.2* 2.0*     Lipase  No results found for: LIPASE   Studies/Results: No results found.  Medications: . sodium chloride   Intravenous Once  . antiseptic oral rinse  7 mL Mouth Rinse BID  . lip balm  1  application Topical BID  . metoprolol  5 mg Intravenous 4 times per day  . pantoprazole (PROTONIX) IV  40 mg Intravenous QHS  . zolpidem  5 mg Oral QHS   . dextrose 5 % and 0.45 % NaCl with KCl 20 mEq/L 75 mL/hr at 08/02/15 0740   Prior to Admission medications   Medication Sig Start Date End Date Taking? Authorizing Provider  acetaminophen (TYLENOL) 325 MG tablet Take 325 mg by mouth every 6 (six) hours as needed for moderate pain.   Yes Historical Provider, MD  citalopram (CELEXA) 20 MG tablet Take 20 mg by mouth every morning.  09/18/14  Yes Historical Provider, MD  lidocaine-prilocaine (EMLA) cream Apply 1 application topically as needed. Patient taking differently: Apply 1 application topically as needed (port).  11/16/14  Yes Truitt Merle, MD  magnesium hydroxide (MILK OF MAGNESIA) 400 MG/5ML suspension Take 5 mLs by mouth daily as needed for mild constipation.   Yes Historical Provider, MD  omeprazole (PRILOSEC) 40 MG capsule Take 40 mg by mouth daily.   Yes Historical Provider, MD  ondansetron (ZOFRAN) 8 MG tablet Take 1 tablet (8 mg total) by mouth every 8 (eight) hours as needed for nausea or vomiting. 01/04/15  Yes Truitt Merle, MD  oxyCODONE (OXY IR/ROXICODONE) 5 MG immediate release tablet  Take 1 tablet (5 mg total) by mouth every 6 (six) hours as needed for severe pain. 05/16/15  Yes Truitt Merle, MD  pantoprazole (PROTONIX) 40 MG tablet Take 1 tablet (40 mg total) by mouth 2 (two) times daily. 06/14/15  Yes Truitt Merle, MD  potassium chloride SA (K-DUR,KLOR-CON) 20 MEQ tablet Take 1 tablet (20 mEq total) by mouth daily. Take as directed. 06/28/15  Yes Truitt Merle, MD  PREVIDENT 5000 BOOSTER PLUS 1.1 % PSTE 1 application by Other route daily.  03/20/15  Yes Historical Provider, MD  zolpidem (AMBIEN) 5 MG tablet Take 1 tablet (5 mg total) by mouth at bedtime as needed for sleep. 05/31/15  Yes Truitt Merle, MD   Pathology:  Small intestine, resection, SMALL BOWEL WITH SEROSA ADHESION AND REACTIVE  CHANGES NEGATIVE FOR MALIGNANCY.   Assessment/Plan GI bleed, small bowel obstruction, adenocarcinoma of the colon  S/p lysis of adhesions, small bowel resection with anastomosis, 07/27/15, Dr. Gurney Maxin POD 5 Probable carcinomatosis on CT scan  Stage III colon cancer (adenocarcinoma) S/p en bloc resection with right colectomy, sigmoid colectomy, colostomy revision, , ureteral reimplantation on the right, 10/10/14, Dr. Doreen Salvage Chemotherapy and radiation therapy Hydronephrosis of right kidney with h/o metastatic cancer near ureter Anemia and neuropathy associated with chemotherapy Hx of PE Anxiety/Depression GERD Anemia post op Antibiotics: None DVT: No heparin secondary to bleeding/SCD's added  Plan:  Advance diet, get drain out later today, continue to mobilize, watch colostomy output.  Continue to monitor her H/H.  Medicine can restart home PO meds as needed.  I am adding oral PO pain meds today.  With her H/H stable I will defer to Medicine on DVT anticoagulation.     LOS: 14 days    JENNINGS,WILLARD 08/02/2015

## 2015-08-02 NOTE — Progress Notes (Signed)
  Pt drain removed without difficulty.  She had some ongoing fluid drainage even after using steri strips.  Steri strips removed, site cleaned with betadine.  We then injected 5 ml of 1% plain xylocaine into the site. Prepped sited was draped, and then a single mattress suture was placed using 2-0 nylon suture.  Pt tolerated the procedure well.

## 2015-08-02 NOTE — Progress Notes (Addendum)
Patient ID: Allison Mitchell, female   DOB: 04/03/1961, 54 y.o.   MRN: [id number] [name] PROGRESS NOTE  Allison Mitchell [id number] DOB: 1960/12/26 DOA: 07/19/2015 PCP: [name], MD  Brief narrative:    54 year old female with past medical history of depression, GERD, stage 3 colon cancer status post right hemicolectomy on 10/10/2014 by Dr. Hulen Skains.. She presented to [facility] ED with reports of maroon colored watery stool in ostomy bag. She was admitted for upper GI bleed. Her initial hemoglobin was 7.4. She has had RBC scan, capsule study and Meckel's all with unclear source of bleed. She has required multiple units of blood during this hospital stay. Patient was eventually taken to the OR on 9/23 for exploratory laparotomy for probable small bowel obstruction.  Patient is s/p lysis of adhesions, small bowel resection with anastomosis, 07/27/15, Dr. Gurney Maxin.  Anticipated discharge: 9/30.   Assessment/Plan:    Principal Problem: Acute blood loss anemia due to occult GI bleeding in the setting of SBO, s/p LOA and SB resection on 9/23 - After extensive workup for occult GI bleed we still don't have a clear evidence of the source of bleed. - Patient had RBC scan, Meckel's study all negative. - She had capsule endoscopy, capsule never retrieved, not in the removed bowel nor inside the patient's abdomen post operatively - She has received multiple blood transfusions during this hospital stay. (2U pRBC 9/15, 2U 9/16 and 2U 9/21, 2U 9/23, 2U 9/27). - Patient received platelets on 9/23 and FFP x 2 on 9/23 intraoperatively - Patient developed SBO seen on CT enterogram, taken to OR. - Pt is s/p LOA and small bowel resection 9/23.  - So far, her hemoglobin remains stable.  - Her diet will be advanced to regular today.  Active Problems: Right hydroureteronephrosis -Dr. Tresa Moore recommended stent replacement if GFR declines or patient starts nephrotoxic chemo again   Essential hypertension   - BP 125/62 - D/C metoprolol   Multifocal stage IIIB colon cancer, MMR normal, MSI stable, biopsy confirmed liver and probable peritoneal metastases, KRAS mutated  - S/p right hemicolectomy with colostomy  - Chemo on hold  - Follows with Dr. Burr Medico on oncology  - Being considered for cytoreductive surgery and HIPEC at [facility]  History of PE - Used to be on Lovenox then on coumadin and was supposed to be on it indefinitely but no longer on any anticoagulation  due to risk of GI bleed   CKD stage 2 - Stable   Chemotherapy induced pancytopenia - Counts remain stable  Hypokalemia - Secondary to GI losses - Supplemented  Hyponatremia - Secondary to prerenal causes, dehydration, GI losses - Sodium normalized with fluids   Severe protein calorie malnutrition - In the context of chronic illness - Diet regular today   DVT Prophylaxis  - SCD's bilaterally in hospital    Code Status: Full.  Family Communication:  plan of care discussed with the patient and her husband at the bedside  Disposition Plan: Home 9/30.  IV access:  Peripheral IV  Procedures and diagnostic studies:    RBC scan  Capsule study  Meckel scan  LOA & SB resection 07/27/2015  Medical Consultants:  GI  Oncology  Surgery  Urology  Other Consultants:   IAnti-Infectives:   Ciprofloxacin - peri operatively Metronidazole - peri operatively   Leisa Lenz, MD  [name] Pager [phone]  Time spent in minutes: 25 minutes  If 7PM-7AM, please contact night-coverage [email or web address] Password [facility] 08/02/2015, 12:31 PM  LOS: 14 days    HPI/Subjective: No acute overnight events. Patient reports feeling better. She wants to go home.   Objective: Filed Vitals:   08/02/15 0000 08/02/15 0545 08/02/15 0700 08/02/15 0830  BP: 140/60 132/63  125/62  Pulse: 79 77  79  Temp:  98.6 F (37 C)  98.9 F (37.2 C)  TempSrc:  Oral  Oral  Resp:  16  16  Height:      Weight:   89.359 kg  (197 lb)   SpO2:  99%  98%    Intake/Output Summary (Last 24 hours) at 08/02/15 1231 Last data filed at 08/02/15 1000  Gross per 24 hour  Intake 1684.3 ml  Output   2880 ml  Net -1195.7 ml    Exam:   General:  Pt is alert, looks better, no distress  Cardiovascular: RRR, S1/S2 appreciated   Respiratory: bilateral air entry, no wheezing   Abdomen: JP (+) in place, colostomy in place, (+) BS  Extremities: No leg swelling, palpable pulses   Neuro: No focal deficits   Data Reviewed: Basic Metabolic Panel:  Recent Labs Lab 07/27/15 2107 07/29/15 0520 07/30/15 0525 07/31/15 0606 08/01/15 0525  NA 136 135 138 140 140  K 3.9 4.5 3.6 3.4* 3.7  CL 104 103 104 109 108  CO2 24 25 28 29 26   GLUCOSE 265* 157* 125* 116* 104*  BUN 9 15 11 7 7   CREATININE 0.98 1.37* 0.92 0.71 0.87  CALCIUM 7.2* 7.7* 7.5* 7.2* 7.4*  MG 1.6*  --  2.2  --   --   PHOS 4.6  --  2.5  --   --    Liver Function Tests:  Recent Labs Lab 07/29/15 0520 07/30/15 0525  AST 19 16  ALT 9* 7*  ALKPHOS 113 91  BILITOT 0.4 0.5  PROT 5.1* 4.4*  ALBUMIN 2.2* 2.0*   No results for input(s): LIPASE, AMYLASE in the last 168 hours. No results for input(s): AMMONIA in the last 168 hours. CBC:  Recent Labs Lab 07/27/15 2107 07/29/15 0520 07/30/15 0525 07/31/15 0606 08/01/15 0525 08/02/15 0542  WBC 12.3* 14.0* 6.0 4.6 3.2* 6.8  NEUTROABS 11.7*  --   --   --   --   --   HGB 9.9* 9.0* 7.5* 7.1* 9.1* 9.9*  HCT 29.9* 28.0* 23.2* 22.2* 27.9* 30.9*  MCV 88.7 90.3 89.9 91.7 90.3 89.3  PLT 132* 201 94* 98* 90* 105*   Cardiac Enzymes: No results for input(s): CKTOTAL, CKMB, CKMBINDEX, TROPONINI in the last 168 hours. BNP: Invalid input(s): POCBNP CBG: No results for input(s): GLUCAP in the last 168 hours.  Recent Results (from the past 240 hour(s))  Surgical pcr screen     Status: None   Collection Time: 07/27/15 12:06 PM  Result Value Ref Range Status   MRSA, PCR NEGATIVE NEGATIVE Final    Staphylococcus aureus NEGATIVE NEGATIVE Final     Scheduled Meds: . metoprolol  5 mg Intravenous 4 times per day  . pantoprazole (PROTONIX) IV  40 mg Intravenous QHS  . zolpidem  5 mg Oral QHS   Continuous Infusions: . dextrose 5 % and 0.45 % NaCl with KCl 20 mEq/L 50 mL/hr at 08/02/15 [id number]

## 2015-08-02 NOTE — Progress Notes (Signed)
Caysie Minnifield   DOB:[birth year]   DG#:[id number]   VZD#:[id number]  Subjective: She feels better overall. He is on clear liquid diet, does feel hungry. His stoma output has been small, still dark bloody liquid. Hemoglobin being stable since her last blood transfusion. Her abdominal drainage tube was removed.  Objective:  Filed Vitals:   08/02/15 0830  BP: 125/62  Pulse: 79  Temp: 98.9 F (37.2 C)  Resp: 16    Body mass index is 29.96 kg/(m^2).  Intake/Output Summary (Last 24 hours) at 08/02/15 1809 Last data filed at 08/02/15 1000  Gross per 24 hour  Intake 1346.8 ml  Output   1830 ml  Net -483.2 ml     Sclerae unicteric  Oropharynx clear  No peripheral adenopathy  Lungs clear -- no rales or rhonchi  Heart regular rate and rhythm  Abdomen soft, (+) small dark red liquid in colostomy bag  MSK no focal spinal tenderness, no peripheral edema  Neuro nonfocal    CBG (last 3)  No results for input(s): GLUCAP in the last 72 hours.   Labs:  Lab Results  Component Value Date   WBC 6.8 08/02/2015   HGB 9.9* 08/02/2015   HCT 30.9* 08/02/2015   MCV 89.3 08/02/2015   PLT 105* 08/02/2015   NEUTROABS 11.7* 07/27/2015    @LASTCHEMISTRY @  Urine Studies No results for input(s): UHGB, CRYS in the last 72 hours.  Invalid input(s): UACOL, UAPR, USPG, UPH, UTP, UGL, [place], UBIL, UNIT, UROB, [place], UEPI, UWBC, Duwayne Heck [place], [place]  Basic Metabolic Panel:  Recent Labs Lab 07/27/15 2107 07/29/15 0520 07/30/15 0525 07/31/15 0606 08/01/15 0525  NA 136 135 138 140 140  K 3.9 4.5 3.6 3.4* 3.7  CL 104 103 104 109 108  CO2 24 25 28 29 26   GLUCOSE 265* 157* 125* 116* 104*  BUN 9 15 11 7 7   CREATININE 0.98 1.37* 0.92 0.71 0.87  CALCIUM 7.2* 7.7* 7.5* 7.2* 7.4*  MG 1.6*  --  2.2  --   --   PHOS 4.6  --  2.5  --   --    GFR Estimated Creatinine Clearance: 86.5 mL/min (by C-G formula based on Cr of 0.87). Liver Function Tests:  Recent Labs Lab 07/29/15 0520  07/30/15 0525  AST 19 16  ALT 9* 7*  ALKPHOS 113 91  BILITOT 0.4 0.5  PROT 5.1* 4.4*  ALBUMIN 2.2* 2.0*   No results for input(s): LIPASE, AMYLASE in the last 168 hours. No results for input(s): AMMONIA in the last 168 hours. Coagulation profile  Recent Labs Lab 07/27/15 2107  INR 1.17    CBC:  Recent Labs Lab 07/27/15 2107 07/29/15 0520 07/30/15 0525 07/31/15 0606 08/01/15 0525 08/02/15 0542  WBC 12.3* 14.0* 6.0 4.6 3.2* 6.8  NEUTROABS 11.7*  --   --   --   --   --   HGB 9.9* 9.0* 7.5* 7.1* 9.1* 9.9*  HCT 29.9* 28.0* 23.2* 22.2* 27.9* 30.9*  MCV 88.7 90.3 89.9 91.7 90.3 89.3  PLT 132* 201 94* 98* 90* 105*   Cardiac Enzymes: No results for input(s): CKTOTAL, CKMB, CKMBINDEX, TROPONINI in the last 168 hours. BNP: Invalid input(s): POCBNP CBG: No results for input(s): GLUCAP in the last 168 hours. D-Dimer No results for input(s): DDIMER in the last 72 hours. Hgb A1c No results for input(s): HGBA1C in the last 72 hours. Lipid Profile No results for input(s): CHOL, HDL, LDLCALC, TRIG, CHOLHDL, LDLDIRECT in the last 72 hours. Thyroid  function studies No results for input(s): TSH, T4TOTAL, T3FREE, THYROIDAB in the last 72 hours.  Invalid input(s): FREET3 Anemia work up No results for input(s): VITAMINB12, FOLATE, FERRITIN, TIBC, IRON, RETICCTPCT in the last 72 hours. Microbiology Recent Results (from the past 240 hour(s))  Surgical pcr screen     Status: None   Collection Time: 07/27/15 12:06 PM  Result Value Ref Range Status   MRSA, PCR NEGATIVE NEGATIVE Final   Staphylococcus aureus NEGATIVE NEGATIVE Final    Comment:        The Xpert SA Assay (FDA approved for NASAL specimens in patients over 54 years of age), is one component of a comprehensive surveillance program.  Test performance has been validated by [facility] for patients greater than or equal to 54 year old. It is not intended to diagnose infection nor to guide or monitor treatment.     Diagnosis 07/27/2015 Small intestine, resection SMALL BOWEL WITH SEROSA ADHESION AND REACTIVE CHANGES NEGATIVE FOR MALIGNANCY   Studies:  No results found.  Assessment: 54 y.o.   1. GI bleeding, unclear source, RBC scan negative, undergoing capsule endoscopy, and small bowel resection on 07/27/15. Surgical path of the small bowel was negative for malignancy or bleeding lesion.   2. Anemia, secondary to GI bleeding, chemo, no evidence of hemolysis, received 8u RBC during this hospitalization  3. Metastatic colon cancer  4 right side pleural effusion, s/p thoracentesis 9/5, cytology (-) 5. Thrombocytopenia, secondary to chemotherapy, consumption from bleeding, stable now  6. History of PE, off coumadin    Plan: -I have spoken with Dr. Marcello Moores, Dr. Carlean Purl, we think her bloody output in colostomy back is likely old blood, which may take a little longer to clear. Her hemoglobin has been stable since last blood transfusion.  -We were not able to locat her capsule camera. Her surgical path sample was reviewed again, the capsule was Not Seen. Postop film did not see capsule. It may has passed out without knowing. -She is advancing her diet, I encouraged her to consider a transient supplement, such as boost. -She is eager to go home.  I will follow up.     Truitt Merle, MD 08/02/2015  6:09 PM

## 2015-08-03 ENCOUNTER — Inpatient Hospital Stay (HOSPITAL_COMMUNITY): Payer: BLUE CROSS/BLUE SHIELD

## 2015-08-03 DIAGNOSIS — D638 Anemia in other chronic diseases classified elsewhere: Secondary | ICD-10-CM

## 2015-08-03 DIAGNOSIS — D696 Thrombocytopenia, unspecified: Secondary | ICD-10-CM | POA: Diagnosis present

## 2015-08-03 DIAGNOSIS — D62 Acute posthemorrhagic anemia: Secondary | ICD-10-CM | POA: Diagnosis present

## 2015-08-03 DIAGNOSIS — D72819 Decreased white blood cell count, unspecified: Secondary | ICD-10-CM

## 2015-08-03 DIAGNOSIS — D72829 Elevated white blood cell count, unspecified: Secondary | ICD-10-CM

## 2015-08-03 DIAGNOSIS — I824Y2 Acute embolism and thrombosis of unspecified deep veins of left proximal lower extremity: Secondary | ICD-10-CM

## 2015-08-03 LAB — URINALYSIS, ROUTINE W REFLEX MICROSCOPIC
GLUCOSE, UA: NEGATIVE mg/dL
Ketones, ur: NEGATIVE mg/dL
Nitrite: NEGATIVE
PROTEIN: 30 mg/dL — AB
SPECIFIC GRAVITY, URINE: 1.023 (ref 1.005–1.030)
UROBILINOGEN UA: 1 mg/dL (ref 0.0–1.0)
pH: 5.5 (ref 5.0–8.0)

## 2015-08-03 LAB — URINE MICROSCOPIC-ADD ON

## 2015-08-03 LAB — CBC
HEMATOCRIT: 33.3 % — AB (ref 36.0–46.0)
Hemoglobin: 10.8 g/dL — ABNORMAL LOW (ref 12.0–15.0)
MCH: 28.9 pg (ref 26.0–34.0)
MCHC: 32.4 g/dL (ref 30.0–36.0)
MCV: 89 fL (ref 78.0–100.0)
Platelets: 121 10*3/uL — ABNORMAL LOW (ref 150–400)
RBC: 3.74 MIL/uL — ABNORMAL LOW (ref 3.87–5.11)
RDW: 15.4 % (ref 11.5–15.5)
WBC: 17.8 10*3/uL — ABNORMAL HIGH (ref 4.0–10.5)

## 2015-08-03 MED ORDER — HYDROCODONE-ACETAMINOPHEN 7.5-325 MG PO TABS
1.0000 | ORAL_TABLET | Freq: Four times a day (QID) | ORAL | Status: DC | PRN
  Administered 2015-08-03 – 2015-08-05 (×7): 1 via ORAL
  Filled 2015-08-03 (×7): qty 1

## 2015-08-03 MED ORDER — PANTOPRAZOLE SODIUM 40 MG PO TBEC
40.0000 mg | DELAYED_RELEASE_TABLET | Freq: Two times a day (BID) | ORAL | Status: DC
  Administered 2015-08-03 – 2015-08-05 (×5): 40 mg via ORAL
  Filled 2015-08-03 (×7): qty 1

## 2015-08-03 MED ORDER — CIPROFLOXACIN IN D5W 400 MG/200ML IV SOLN
400.0000 mg | Freq: Two times a day (BID) | INTRAVENOUS | Status: DC
  Administered 2015-08-03 – 2015-08-05 (×4): 400 mg via INTRAVENOUS
  Filled 2015-08-03 (×6): qty 200

## 2015-08-03 MED ORDER — CIPROFLOXACIN IN D5W 400 MG/200ML IV SOLN: 400.0000 mg | Freq: Two times a day (BID) | INTRAVENOUS | Status: DC

## 2015-08-03 MED ORDER — KCL IN DEXTROSE-NACL 20-5-0.9 MEQ/L-%-% IV SOLN
INTRAVENOUS | Status: DC
  Administered 2015-08-04: 15:00:00 via INTRAVENOUS
  Administered 2015-08-04: 100 mL/h via INTRAVENOUS
  Administered 2015-08-05: 01:00:00 via INTRAVENOUS
  Filled 2015-08-03 (×6): qty 1000

## 2015-08-03 NOTE — Progress Notes (Signed)
Nutrition Follow-up  DOCUMENTATION CODES:   Severe malnutrition in context of chronic illness, Severe malnutrition in context of acute illness/injury  INTERVENTION:  Diet advancement per MD    NUTRITION DIAGNOSIS:   Inadequate oral intake related to poor appetite, inability to eat as evidenced by per patient/family report.  New diagnosis, pt is no longer NPO.  GOAL:   Patient will meet greater than or equal to 90% of their needs  Has not yet been met  MONITOR:   Diet advancement, Labs, Weight trends, Skin, I & O's  Monitor meal completion  REASON FOR ASSESSMENT:   NPO/Clear Liquid Diet, LOS    ASSESSMENT:   54 yo female with adenocarcinoma of the colon s/p en bloc resection with R colectomy, sigmoid colectomy now has been through multiple treatments of chemo and radiation, complicated by PE for which she was on coumadin until 1 month ago when she began having bleeding out her ostomy. She has undergone upper and lower endoscopy with findings of esophagitis but otherwise no culprit for this bleed. She was undergoing capsule endoscopy but the capsule has now stopped movement and there is concern for blockage. She continues to have maroon colored stools out her ostomy.    Diet advanced to SOFT today. Pt now has 9# wt loss since admission. Pt states she has severe GERD, was forced to drink Ensure Enlive before bed last night and was choking throughout the night. Says she hasn't really been hungry after that episode. Pt states she is tolerating diet ok. Monitor meal completion and diet advancement.     Diet Order:  Diet full liquid Room service appropriate?: Yes; Fluid consistency:: Thin DIET SOFT Room service appropriate?: Yes; Fluid consistency:: Thin  Skin:  Reviewed, no issues  Last BM:  9/27  Height:   Ht Readings from Last 1 Encounters:  07/27/15 5' 8"  (1.727 m)    Weight:   Wt Readings from Last 1 Encounters:  08/03/15 198 lb 10.9 oz (90.12 kg)    Ideal  Body Weight:  63.6 kg  BMI:  Body mass index is 30.22 kg/(m^2).  Estimated Nutritional Needs:   Kcal:  1700-1900  Protein:  90-100g  Fluid:  1.8L/day  EDUCATION NEEDS:   No education needs identified at this time  Satira Anis. Ward, MS, RD LDN After Hours/Weekend Pager [phone]

## 2015-08-03 NOTE — Progress Notes (Signed)
7 Days Post-Op  Subjective: She had allot of pain last PM, percocet didn't work, taking very little PO, nauseated this Am and doesn't want to eat at all.  Fluid in the ostomy is still about the same, no real stool, just old liquid with some blood.  Her left leg is much larger than the right also.  Objective: Vital signs in last 24 hours: Temp:  [98.6 F (37 C)-99.3 F (37.4 C)] 98.6 F (37 C) (09/30 0554) Pulse Rate:  [101-103] 103 (09/30 0554) Resp:  [18] 18 (09/30 0554) BP: (140-159)/(68-79) 140/68 mmHg (09/30 0554) SpO2:  [96 %] 96 % (09/30 0554) Weight:  [90.12 kg (198 lb 10.9 oz)] 90.12 kg (198 lb 10.9 oz) (09/30 0626) Last BM Date: 08/03/15 Drain out Only 75 from ostomy recorded. Afebrile, tachycardic this AM, last evening CBC is pending No films Intake/Output from previous day: 09/29 0701 - 09/30 0700 In: 1229.7 [I.V.:1229.7] Out: 2040 [Urine:1800; Drains:165; Stool:75] Intake/Output this shift:    General appearance: alert, cooperative, no distress and just feels bad this AM. Resp: clear to auscultation bilaterally GI: soft still sore, nauseated this AM, few BS. drainage from the NG is still all fluid, no substance. Extremities: left leg appear larger than the right, some edema, not tender.  Lab Results:   Recent Labs  08/01/15 0525 08/02/15 0542  WBC 3.2* 6.8  HGB 9.1* 9.9*  HCT 27.9* 30.9*  PLT 90* 105*    BMET  Recent Labs  08/01/15 0525  NA 140  K 3.7  CL 108  CO2 26  GLUCOSE 104*  BUN 7  CREATININE 0.87  CALCIUM 7.4*   PT/INR No results for input(s): LABPROT, INR in the last 72 hours.   Recent Labs Lab 07/29/15 0520 07/30/15 0525  AST 19 16  ALT 9* 7*  ALKPHOS 113 91  BILITOT 0.4 0.5  PROT 5.1* 4.4*  ALBUMIN 2.2* 2.0*     Lipase  No results found for: LIPASE   Studies/Results: No results found.  Medications: . sodium chloride   Intravenous Once  . antiseptic oral rinse  7 mL Mouth Rinse BID  . lip balm  1 application  Topical BID  . pantoprazole (PROTONIX) IV  40 mg Intravenous QHS  . zolpidem  5 mg Oral QHS    Assessment/Plan GI bleed, small bowel obstruction, adenocarcinoma of the colon  S/p lysis of adhesions, small bowel resection with anastomosis, 07/27/15, Dr. Gurney Maxin POD 5 Probable carcinomatosis on CT scan  Stage III colon cancer (adenocarcinoma) S/p en bloc resection with right colectomy, sigmoid colectomy, colostomy revision, , ureteral reimplantation on the right, 10/10/14, Dr. Doreen Salvage Chemotherapy and radiation therapy Hydronephrosis of right kidney with h/o metastatic cancer near ureter Anemia and neuropathy associated with chemotherapy Hx of PE Anxiety/Depression GERD Anemia post op Antibiotics: None DVT: No heparin secondary to bleeding/SCD's added   Plan:  I will advance her as tolerated to a soft diet, she doesn't want anything right now.  Continue IV fluids, checking cbc again.   Check lower extremity dopplers she has been off DVT prophylaxis secondary to bleeding. I don't thin she is ready for d/c.  She has significant GERD change time for PPI.  WBC up to 17.8, H/H is up consistent with dehydration. Will add CXR and UA. UA shows:  TNTC WBC DVT doppler reported negative CXR:  Small right effusion. Increased from previous exam  Plan urine culture and start Cipro she already has hydronephrosis on right secondary to metastatic cancer.  LOS: 15 days    Allison Mitchell 08/03/2015

## 2015-08-03 NOTE — Progress Notes (Signed)
*  Preliminary Results* Bilateral lower extremity venous duplex completed. Bilateral lower extremities are negative for deep vein thrombosis. There is no evidence of Baker's cyst bilaterally.  08/03/2015  Maudry Mayhew, RVT, RDCS, RDMS

## 2015-08-03 NOTE — Progress Notes (Signed)
OT Cancellation Note  Patient Details Name: Allison Mitchell MRN: [id number] DOB: 1961-10-10   Cancelled Treatment:     spoke with pt who states she is getting up to the bathroom and doing her ADL without difficulty. Observed pt to come out of the bathroom when OT arrived today and pt moving around, pushing IV pole without difficulty. She states she has plenty of help at home also. Will sign off for OT per pt request.  Jules Schick  [phone] 08/03/2015, 11:19 AM

## 2015-08-03 NOTE — Care Management Note (Signed)
Case Management Note  Patient Details  Name: Allison Mitchell MRN: [id number] Date of Birth: April 20, 1961  Subjective/Objective:      S/p lysis of adhesions, small bowel resection with anastomosis              Action/Plan: Discharge planning, spoke with patient at bedside. Has ostomy from previous surgery and is independent with care, may need drain care. Has used [facility] in the past and would like to use them again for [facility] needs. No orders, will follow for d/c needs.  Expected Discharge Date:   (unknown)               Expected Discharge Plan:  [place]  In-House Referral:     Discharge planning Services  CM Consult  Post Acute Care Choice:  Home Health Choice offered to:  Patient  DME Arranged:  N/A DME Agency:  NA  HH Arranged:  RN, Disease Management [place] Agency:  [place]  Status of Service:  In process, will continue to follow  Medicare Important Message Given:    Date Medicare IM Given:    Medicare IM give by:    Date Additional Medicare IM Given:    Additional Medicare Important Message give by:     If discussed at [place] of Stay Meetings, dates discussed:    Additional Comments:  Guadalupe Maple, RN 08/03/2015, 2:45 PM

## 2015-08-03 NOTE — Progress Notes (Signed)
PT Cancellation Note  Patient Details Name: Allison Mitchell MRN: [id number] DOB: Oct 03, 1961   Cancelled Treatment:    Reason Eval/Treat Not Completed: PT screened, no needs identified, will sign off. spoke briefly with pt who reports she has been ambulating in hallways without assistance. Pt denied any further need for PT services. Will sign off at this time. Please re-order if mobility needs change/decline. Thanks.    Weston Anna, MPT Pager: [phone]

## 2015-08-03 NOTE — Progress Notes (Addendum)
Patient ID: Allison Mitchell, female   DOB: 07/10/1961, 54 y.o.   MRN: [id number] [name] PROGRESS NOTE  Allison Mitchell KZL:[id number] DOB: [birth year] DOA: 07/19/2015 PCP: [name], MD  Brief narrative:    54 year old female with past medical history of depression, GERD, stage 3 colon cancer status post right hemicolectomy on 10/10/2014 by Dr. Hulen Skains.. She presented to [facility] ED with reports of maroon colored watery stool in ostomy bag. She was admitted for upper GI bleed. Her initial hemoglobin was 7.4. She has had RBC scan, capsule study and Meckel's all with unclear source of bleed. She has required multiple units of blood during this hospital stay. Patient was eventually taken to the OR on 9/23 for exploratory laparotomy for probable small bowel obstruction.  Patient is s/p lysis of adhesions, small bowel resection with anastomosis, 07/27/15, Dr. Gurney Maxin.  Anticipated discharge: 08/04/2015 if she feels better.   Assessment/Plan:    Principal Problem: Acute blood loss anemia due to occult GI bleeding / Anemia of chronic disease / Foreign body in digestive system from capsule endoscopy  - Patient has had extensive workup through out to the hospital stay for occult GI bleed. No clear source of bleed identified. RBC scan, Meckel's study all negative. - Capsule endoscopy done however capsule never retrieved, not found in removed bowel nor inside the patient's abdomen post operatively.  - She has required multiple transfusions during this hospital stay. (2U pRBC 9/15, 2U 9/16 and 2U 9/21, 2U 9/23, 2U 9/27). - She has received platelets on 9/23 and FFP x 2 on 9/23 intraoperatively. - Her hemoglobin remains stable.  Active Problems: SBO, s/p LOA and SB resection on 9/23 - Patient developed SBO during this hospital stay. - She is s/p LOA and small bowel resection 9/23.  - Diet slowly advanced. So far she tolerates regular diet but had nausea this morning.  Right  hydroureteronephrosis -Dr. Tresa Moore of urologyrecommended stent replacement if GFR declines or patient starts nephrotoxic chemo again   Essential hypertension  - Blood pressure stable.  Multifocal stage IIIB colon cancer, MMR normal, MSI stable, biopsy confirmed liver and probable peritoneal metastases, KRAS mutated  - S/p right hemicolectomy with colostomy  - Dr. Burr Medico following - Being considered for cytoreductive surgery and H[facility]EC at [facility]  History of PE - Used to be on Lovenox then on coumadin  - Patient was supposed to be on anticoagulation indefinitely however not on any anticoagulation because of risk of bleed. We'll see with oncology if anticoagulation can be resumed.  - Will check lower extremity Doppler.  CKD stage 2 - Stable   Chemotherapy induced pancytopenia / thrombocytopenia - Counts remain stable  Leukocytosis / leukopenia - White blood cell count fluctuates from leukopenia to now leukocytosis. - No obvious source of infection.  Hypokalemia - Secondary to GI losses - Potassium repleted.  Hyponatremia - Secondary to prerenal causes, dehydration, GI losses - Sodium normalized with fluids   Severe protein calorie malnutrition - In the context of chronic illness - Diet as tolerated   DVT Prophylaxis  - SCD's bilaterally    Code Status: Full.  Family Communication:  plan of care discussed with the patient and her husband at the bedside  Disposition Plan: Home possibly by 08/04/2015  IV access:  Peripheral IV  Procedures and diagnostic studies:    RBC scan  Capsule study  Meckel scan  LOA & SB resection 07/27/2015  Medical Consultants:  GI  Oncology  Surgery  Urology  Other  Consultants:   IAnti-Infectives:   Ciprofloxacin - peri operatively Metronidazole - peri operatively   Leisa Lenz, MD  [name] Pager [phone]  Time spent in minutes: 25 minutes  If 7PM-7AM, please contact  night-coverage [email or web address] Password [email or web address] 08/03/2015, 12:47 PM   LOS: 15 days    HPI/Subjective: No acute overnight events. Patient reports feeling nauseous this morning.  Objective: Filed Vitals:   08/02/15 0830 08/02/15 2111 08/03/15 0554 08/03/15 0626  BP: 125/62 159/79 140/68   Pulse: 79 101 103   Temp: 98.9 F (37.2 C) 99.3 F (37.4 C) 98.6 F (37 C)   TempSrc: Oral Oral Oral   Resp: 16 18 18    Height:      Weight:    90.12 kg (198 lb 10.9 oz)  SpO2: 98% 96% 96%     Intake/Output Summary (Last 24 hours) at 08/03/15 1247 Last data filed at 08/03/15 1032  Gross per 24 hour  Intake   1000 ml  Output   2130 ml  Net  -1130 ml    Exam:   General:  Pt is alert, no acute distress  Cardiovascular: Rate control, appreciate S1-S2   Respiratory: Clear to auscultation, no wheezing  Abdomen: Appreciate bowel sounds, JP (+) in place, colostomy in place  Extremities: No edema, palpable pulses bilaterally  Neuro: Nonfocal  Data Reviewed: Basic Metabolic Panel:  Recent Labs Lab 07/27/15 2107 07/29/15 0520 07/30/15 0525 07/31/15 0606 08/01/15 0525  NA 136 135 138 140 140  K 3.9 4.5 3.6 3.4* 3.7  CL 104 103 104 109 108  CO2 24 25 28 29 26   GLUCOSE 265* 157* 125* 116* 104*  BUN 9 15 11 7 7   CREATININE 0.98 1.37* 0.92 0.71 0.87  CALCIUM 7.2* 7.7* 7.5* 7.2* 7.4*  MG 1.6*  --  2.2  --   --   PHOS 4.6  --  2.5  --   --    Liver Function Tests:  Recent Labs Lab 07/29/15 0520 07/30/15 0525  AST 19 16  ALT 9* 7*  ALKPHOS 113 91  BILITOT 0.4 0.5  PROT 5.1* 4.4*  ALBUMIN 2.2* 2.0*   No results for input(s): LIPASE, AMYLASE in the last 168 hours. No results for input(s): AMMONIA in the last 168 hours. CBC:  Recent Labs Lab 07/27/15 2107  07/30/15 0525 07/31/15 0606 08/01/15 0525 08/02/15 0542 08/03/15 1058  WBC 12.3*  < > 6.0 4.6 3.2* 6.8 17.8*  NEUTROABS 11.7*  --   --   --   --   --   --   HGB 9.9*  < > 7.5* 7.1* 9.1* 9.9* 10.8*  HCT 29.9*  <  > 23.2* 22.2* 27.9* 30.9* 33.3*  MCV 88.7  < > 89.9 91.7 90.3 89.3 89.0  PLT 132*  < > 94* 98* 90* 105* 121*  < > = values in this interval not displayed. Cardiac Enzymes: No results for input(s): CKTOTAL, CKMB, CKMBINDEX, TROPONINI in the last 168 hours. BNP: Invalid input(s): POCBNP CBG: No results for input(s): GLUCAP in the last 168 hours.  Recent Results (from the past 240 hour(s))  Surgical pcr screen     Status: None   Collection Time: 07/27/15 12:06 PM  Result Value Ref Range Status   MRSA, PCR NEGATIVE NEGATIVE Final   Staphylococcus aureus NEGATIVE NEGATIVE Final    . sodium chloride   Intravenous Once  . antiseptic oral rinse  7 mL Mouth Rinse BID  . lip balm  1 application Topical BID  .  pantoprazole  40 mg Oral BID  . zolpidem  5 mg Oral QHS     Continuous Infusions: . dextrose 5 % and 0.9 % NaCl with KCl 20 mEq/L 100 mL/hr at 08/03/15 1230

## 2015-08-04 LAB — COMPREHENSIVE METABOLIC PANEL
ALK PHOS: 182 U/L — AB (ref 38–126)
ALT: 13 U/L — AB (ref 14–54)
AST: 30 U/L (ref 15–41)
Albumin: 1.6 g/dL — ABNORMAL LOW (ref 3.5–5.0)
Anion gap: 7 (ref 5–15)
BUN: 9 mg/dL (ref 6–20)
CALCIUM: 7.4 mg/dL — AB (ref 8.9–10.3)
CO2: 24 mmol/L (ref 22–32)
CREATININE: 0.93 mg/dL (ref 0.44–1.00)
Chloride: 99 mmol/L — ABNORMAL LOW (ref 101–111)
Glucose, Bld: 120 mg/dL — ABNORMAL HIGH (ref 65–99)
Potassium: 3.9 mmol/L (ref 3.5–5.1)
Sodium: 130 mmol/L — ABNORMAL LOW (ref 135–145)
Total Bilirubin: 0.9 mg/dL (ref 0.3–1.2)
Total Protein: 4.4 g/dL — ABNORMAL LOW (ref 6.5–8.1)

## 2015-08-04 LAB — CBC
HCT: 27.9 % — ABNORMAL LOW (ref 36.0–46.0)
HEMOGLOBIN: 9.3 g/dL — AB (ref 12.0–15.0)
MCH: 29.6 pg (ref 26.0–34.0)
MCHC: 33.3 g/dL (ref 30.0–36.0)
MCV: 88.9 fL (ref 78.0–100.0)
Platelets: 103 10*3/uL — ABNORMAL LOW (ref 150–400)
RBC: 3.14 MIL/uL — AB (ref 3.87–5.11)
RDW: 15.3 % (ref 11.5–15.5)
WBC: 13.7 10*3/uL — AB (ref 4.0–10.5)

## 2015-08-04 MED ORDER — HYDROCODONE-ACETAMINOPHEN 7.5-325 MG PO TABS: 1.0000 | ORAL_TABLET | Freq: Four times a day (QID) | ORAL | Status: AC | PRN

## 2015-08-04 NOTE — Progress Notes (Signed)
Transferred to room 1621 via wheel chair  and report given to Leotis Shames

## 2015-08-04 NOTE — Progress Notes (Signed)
Patient ID: Allison Mitchell, female   DOB: 1961-05-15, 55 y.o.   MRN: [id number] [name] PROGRESS NOTE  Allison Mitchell AJG:[id number] DOB: Feb 18, 1961 DOA: 07/19/2015 PCP: [name], MD  Brief narrative:    54 year old female with past medical history of depression, GERD, stage 3 colon cancer status post right hemicolectomy on 10/10/2014 by Dr. Hulen Skains.. She presented to [facility] ED with reports of maroon colored watery stool in ostomy bag. She was admitted for upper GI bleed. Her initial hemoglobin was 7.4. She has had RBC scan, capsule study and Meckel's all with unclear source of bleed. She has required multiple units of blood during this hospital stay. Patient was eventually taken to the OR on 9/23 for exploratory laparotomy for probable small bowel obstruction.  Patient is s/p lysis of adhesions, small bowel resection with anastomosis, 07/27/15, Dr. Gurney Maxin.  Anticipated discharge: Monitor for next 24 hours. If she tolerates solid more then D/C likely 10/2.    Assessment/Plan:    Principal Problem: Acute blood loss anemia due to occult GI bleeding / Anemia of chronic disease / Foreign body in digestive system from capsule endoscopy  - Had an extensive workup through out to the hospital stay for occult GI bleed. No clear source of bleed identified. RBC scan, Meckel's study all negative. - Capsule endoscopy done but capsule never retrieved, not found in removed bowel nor inside the patient's abdomen post operatively.  - Required multiple transfusions during this hospital stay. (2U pRBC 9/15, 2U 9/16 and 2U 9/21, 2U 9/23, 2U 9/27). - Received platelets on 9/23 and FFP x 2 on 9/23 intraoperatively. - Monitor CBC daily while in hospital.   Active Problems: SBO, s/p LOA and SB resection on 9/23 - Patient developed SBO during this hospital stay. - She is s/p LOA and small bowel resection 9/23.  - Diet as tolerated  - Surgery following   Right hydroureteronephrosis -Dr. Tresa Moore of  urology recommended stent replacement if GFR declines or patient starts nephrotoxic chemo again   Essential hypertension  - At goal.   Multifocal stage IIIB colon cancer, MMR normal, MSI stable, biopsy confirmed liver and probable peritoneal metastases, KRAS mutated  - S/p right hemicolectomy with colostomy  - Being considered for cytoreductive surgery and HIPEC at [facility]  History of PE - Used to be on Lovenox then on coumadin  - Patient supposed to be on anticoagulation indefinitely however AC stopped because of risk of bleed.  - LE doppler pending.   CKD stage 2 - Stable  - Cr WNL  Chemotherapy induced pancytopenia / thrombocytopenia - Hemoglobin stable - Platelets 103-121, stable - No reports of bleed   Leukocytosis / leukopenia - White blood cell count fluctuates from leukopenia to now leukocytosis. - No evidence of infection - On Cipro   Hypokalemia - Due to GI losses - Supplemented   Hyponatremia - Secondary to prerenal causes, dehydration, GI losses - Sodium normalized but then down again to 130 this am - Will continue to monitor  Severe protein calorie malnutrition - In the context of chronic illness, malignancy - Diet advanced, encourage PO intake    DVT Prophylaxis  - SCD's bilaterally in hospital    Code Status: Full.  Family Communication:  plan of care discussed with the patient and her husband at the bedside  Disposition Plan: Home possibly by 10/2  IV access:  Peripheral IV  Procedures and diagnostic studies:    RBC scan  Capsule study  Meckel scan  LOA &  SB resection 07/27/2015 LE doppler 08/03/2015   Medical Consultants:  GI  Oncology  Surgery  Urology  Other Consultants:   IAnti-Infectives:   Ciprofloxacin  Metronidazole - peri operatively   Leisa Lenz, MD  [name] Pager [phone]  Time spent in minutes: 15 minutes  If 7PM-7AM, please contact night-coverage [email or web address] Password  [email or web address] 08/04/2015, 11:50 AM   LOS: 16 days    HPI/Subjective: No acute overnight events. Patient reports feeling better.  Objective: Filed Vitals:   08/03/15 0554 08/03/15 0626 08/03/15 2217 08/04/15 0600  BP: 140/68  137/75 140/61  Pulse: 103  103 95  Temp: 98.6 F (37 C)  100.6 F (38.1 C) 98.7 F (37.1 C)  TempSrc: Oral  Oral Oral  Resp: 18  18 16   Height:      Weight:  90.12 kg (198 lb 10.9 oz)    SpO2: 96%  97% 98%    Intake/Output Summary (Last 24 hours) at 08/04/15 1150 Last data filed at 08/04/15 1000  Gross per 24 hour  Intake   2560 ml  Output    850 ml  Net   1710 ml    Exam:   General:  Pt is alert, awake, no acute distress   Cardiovascular: RRR, (+) S1, S2   Respiratory: bilateral air entry, no wheezing   Abdomen: (+) colostomy, appreciate BS, non tender  Extremities: No leg swelling, appreciate palpable pulses   Neuro: No focal deficits   Data Reviewed: Basic Metabolic Panel:  Recent Labs Lab 07/29/15 0520 07/30/15 0525 07/31/15 0606 08/01/15 0525 08/04/15 0530  NA 135 138 140 140 130*  K 4.5 3.6 3.4* 3.7 3.9  CL 103 104 109 108 99*  CO2 25 28 29 26 24   GLUCOSE 157* 125* 116* 104* 120*  BUN 15 11 7 7 9   CREATININE 1.37* 0.92 0.71 0.87 0.93  CALCIUM 7.7* 7.5* 7.2* 7.4* 7.4*  MG  --  2.2  --   --   --   PHOS  --  2.5  --   --   --    Liver Function Tests:  Recent Labs Lab 07/29/15 0520 07/30/15 0525 08/04/15 0530  AST 19 16 30   ALT 9* 7* 13*  ALKPHOS 113 91 182*  BILITOT 0.4 0.5 0.9  PROT 5.1* 4.4* 4.4*  ALBUMIN 2.2* 2.0* 1.6*   No results for input(s): LIPASE, AMYLASE in the last 168 hours. No results for input(s): AMMONIA in the last 168 hours. CBC:  Recent Labs Lab 07/31/15 0606 08/01/15 0525 08/02/15 0542 08/03/15 1058 08/04/15 0530  WBC 4.6 3.2* 6.8 17.8* 13.7*  HGB 7.1* 9.1* 9.9* 10.8* 9.3*  HCT 22.2* 27.9* 30.9* 33.3* 27.9*  MCV 91.7 90.3 89.3 89.0 88.9  PLT 98* 90* 105* 121* 103*   Cardiac  Enzymes: No results for input(s): CKTOTAL, CKMB, CKMBINDEX, TROPONINI in the last 168 hours. BNP: Invalid input(s): POCBNP CBG: No results for input(s): GLUCAP in the last 168 hours.  Recent Results (from the past 240 hour(s))  Surgical pcr screen     Status: None   Collection Time: 07/27/15 12:06 PM  Result Value Ref Range Status   MRSA, PCR NEGATIVE NEGATIVE Final   Staphylococcus aureus NEGATIVE NEGATIVE Final    . ciprofloxacin  400 mg Intravenous Q12H  . lip balm  1 application Topical BID  . pantoprazole  40 mg Oral BID  . zolpidem  5 mg Oral QHS     Continuous Infusions: . dextrose 5 % and 0.9 %  NaCl with KCl 20 mEq/L 100 mL/hr (08/04/15 0438)

## 2015-08-04 NOTE — Progress Notes (Signed)
Patient ID: Allison Mitchell, female   DOB: 02/13/1961, 54 y.o.   MRN: [id number] [facility] Surgery Progress Note:   8 Days Post-Op  Subjective: Mental status is alert; no complaints Objective: Vital signs in last 24 hours: Temp:  [98.7 F (37.1 C)-100.6 F (38.1 C)] 98.7 F (37.1 C) (10/01 0600) Pulse Rate:  [95-103] 95 (10/01 0600) Resp:  [16-18] 16 (10/01 0600) BP: (137-140)/(61-75) 140/61 mmHg (10/01 0600) SpO2:  [97 %-98 %] 98 % (10/01 0600)  Intake/Output from previous day: 09/30 0701 - 10/01 0700 In: 10 [I.V.:10] Out: 1100 [Urine:950; Stool:150] Intake/Output this shift:    Physical Exam: Work of breathing is not labored;  Ostomy pink;  Incision bland  Lab Results:  Results for orders placed or performed during the hospital encounter of 07/19/15 (from the past 48 hour(s))  CBC     Status: Abnormal   Collection Time: 08/03/15 10:58 AM  Result Value Ref Range   WBC 17.8 (H) 4.0 - 10.5 K/uL   RBC 3.74 (L) 3.87 - 5.11 MIL/uL   Hemoglobin 10.8 (L) 12.0 - 15.0 g/dL   HCT 33.3 (L) 36.0 - 46.0 %   MCV 89.0 78.0 - 100.0 fL   MCH 28.9 26.0 - 34.0 pg   MCHC 32.4 30.0 - 36.0 g/dL   RDW 15.4 11.5 - 15.5 %   Platelets 121 (L) 150 - 400 K/uL  Urinalysis, Routine w reflex microscopic (not at [facility])     Status: Abnormal   Collection Time: 08/03/15  1:09 PM  Result Value Ref Range   Color, Urine AMBER (A) YELLOW    Comment: BIOCHEMICALS MAY BE AFFECTED BY COLOR   APPearance CLOUDY (A) CLEAR   Specific Gravity, Urine 1.023 1.005 - 1.030   pH 5.5 5.0 - 8.0   Glucose, UA NEGATIVE NEGATIVE mg/dL   Hgb urine dipstick MODERATE (A) NEGATIVE   Bilirubin Urine SMALL (A) NEGATIVE   Ketones, ur NEGATIVE NEGATIVE mg/dL   Protein, ur 30 (A) NEGATIVE mg/dL   Urobilinogen, UA 1.0 0.0 - 1.0 mg/dL   Nitrite NEGATIVE NEGATIVE   Leukocytes, UA LARGE (A) NEGATIVE  Urine microscopic-add on     Status: Abnormal   Collection Time: 08/03/15  1:09 PM  Result Value Ref Range   Squamous  Epithelial / LPF RARE RARE   WBC, UA TOO NUMEROUS TO COUNT <3 WBC/hpf   RBC / HPF 3-6 <3 RBC/hpf   Bacteria, UA MANY (A) RARE   Urine-Other AMORPHOUS URATES/PHOSPHATES     Comment: MUCOUS PRESENT  CBC     Status: Abnormal   Collection Time: 08/04/15  5:30 AM  Result Value Ref Range   WBC 13.7 (H) 4.0 - 10.5 K/uL   RBC 3.14 (L) 3.87 - 5.11 MIL/uL   Hemoglobin 9.3 (L) 12.0 - 15.0 g/dL   HCT 27.9 (L) 36.0 - 46.0 %   MCV 88.9 78.0 - 100.0 fL   MCH 29.6 26.0 - 34.0 pg   MCHC 33.3 30.0 - 36.0 g/dL   RDW 15.3 11.5 - 15.5 %   Platelets 103 (L) 150 - 400 K/uL    Comment: REPEATED TO VERIFY SPECIMEN CHECKED FOR CLOTS PLATELET COUNT CONFIRMED BY SMEAR LARGE PLATELETS PRESENT   Comprehensive metabolic panel     Status: Abnormal   Collection Time: 08/04/15  5:30 AM  Result Value Ref Range   Sodium 130 (L) 135 - 145 mmol/L   Potassium 3.9 3.5 - 5.1 mmol/L   Chloride 99 (L) 101 - 111 mmol/L  CO2 24 22 - 32 mmol/L   Glucose, Bld 120 (H) 65 - 99 mg/dL   BUN 9 6 - 20 mg/dL   Creatinine, Ser 0.93 0.44 - 1.00 mg/dL   Calcium 7.4 (L) 8.9 - 10.3 mg/dL   Total Protein 4.4 (L) 6.5 - 8.1 g/dL   Albumin 1.6 (L) 3.5 - 5.0 g/dL   AST 30 15 - 41 U/L   ALT 13 (L) 14 - 54 U/L   Alkaline Phosphatase 182 (H) 38 - 126 U/L   Total Bilirubin 0.9 0.3 - 1.2 mg/dL   GFR calc non Af Amer >60 >60 mL/min   GFR calc Af Amer >60 >60 mL/min    Comment: (NOTE) The eGFR has been calculated using the CKD EPI equation. This calculation has not been validated in all clinical situations. eGFR's persistently <60 mL/min signify possible Chronic Kidney Disease.    Anion gap 7 5 - 15    Radiology/Results: Dg Chest 2 View  08/03/2015   CLINICAL DATA:  Leukocytosis and weakness  EXAM: CHEST  2 VIEW  COMPARISON:  07/13/2015  FINDINGS: There is a left chest wall port a catheter with tip in the SVC. The heart size appears normal. Small right pleural effusion. Increased from previous exam. No airspace consolidation  identified.  IMPRESSION: 1. Small right effusion.  Increased from previous exam.   Electronically Signed   By: Kerby Moors M.D.   On: 08/03/2015 14:16    Anti-infectives: Anti-infectives    Start     Dose/Rate Route Frequency Ordered Stop   08/03/15 1500  ciprofloxacin (CIPRO) IVPB 400 mg  Status:  Discontinued     400 mg 200 mL/hr over 60 Minutes Intravenous Every 12 hours 08/03/15 1454 08/03/15 1455   08/03/15 1500  ciprofloxacin (CIPRO) IVPB 400 mg     400 mg 200 mL/hr over 60 Minutes Intravenous Every 12 hours 08/03/15 1455     07/27/15 2330  ciprofloxacin (CIPRO) IVPB 400 mg     400 mg 200 mL/hr over 60 Minutes Intravenous  Once 07/27/15 1746 07/28/15 0116   07/27/15 2100  metroNIDAZOLE (FLAGYL) IVPB 500 mg     500 mg 100 mL/hr over 60 Minutes Intravenous Every 8 hours 07/27/15 1746 07/28/15 0532   07/27/15 1300  metroNIDAZOLE (FLAGYL) IVPB 500 mg     500 mg 100 mL/hr over 60 Minutes Intravenous On call to O.R. 07/27/15 1153 07/27/15 1313   07/27/15 1300  ciprofloxacin (CIPRO) IVPB 400 mg     400 mg 200 mL/hr over 60 Minutes Intravenous On call to O.R. 07/27/15 1153 07/27/15 1329      Assessment/Plan: Problem List: Patient Active Problem List   Diagnosis Date Noted  . Anemia of chronic disease 08/03/2015  . Thrombocytopenia 08/03/2015  . Leukopenia 08/03/2015  . Leukocytosis 08/03/2015  . Postoperative anemia due to acute blood loss 08/03/2015  . Colostomy in place 07/29/2015  . Hydronephrosis of right kidney with h/o metastatic cancer near ureter 07/26/2015  . CKD (chronic kidney disease) stage 2, GFR 60-89 ml/min 07/25/2015  . Foreign body in digestive system   . 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 Occult GI bleeding   . Long term current use of anticoagulant therapy 07/08/2015  . Hypokalemia 07/08/2015  . Pulmonary emboli 11/28/2014  . SBO (small bowel obstruction) s/p LOA & SB resection 07/27/2015 10/03/2014    Taking POs but not ready  to advance.   8 Days Post-Op    LOS: 16 days  Matt B. Hassell Done, MD, [facility] [phone] beeper [phone]  08/04/2015 9:57 AM

## 2015-08-05 LAB — URINE CULTURE

## 2015-08-05 MED ORDER — HEPARIN SOD (PORK) LOCK FLUSH 100 UNIT/ML IV SOLN
500.0000 [IU] | INTRAVENOUS | Status: AC | PRN
  Administered 2015-08-05: 500 [IU]

## 2015-08-05 NOTE — Progress Notes (Signed)
Patient ID: Allison Mitchell, female   DOB: 1961/05/21, 53 y.o.   MRN: [id number] [facility] Progress Note:   8 Days Post-Op  Subjective: Mental status is alert; no complaints Objective: Vital signs in last 24 hours: Temp:  [98.4 F (36.9 C)-99 F (37.2 C)] 99 F (37.2 C) (10/02 0549) Pulse Rate:  [86-87] 87 (10/02 0549) Resp:  [16-18] 16 (10/02 0549) BP: (114-123)/(45-52) 123/52 mmHg (10/02 0549) SpO2:  [99 %] 99 % (10/02 0549) Weight:  [92.262 kg (203 lb 6.4 oz)] 92.262 kg (203 lb 6.4 oz) (10/01 1300)  Intake/Output from previous day: 10/01 0701 - 10/02 0700 In: 4790 [P.O.:240; I.V.:4150; IV Piggyback:400] Out: 750 [Urine:500; Stool:250] Intake/Output this shift:    Physical Exam: Work of breathing is not labored;  Ostomy pink;  Incision bland  Lab Results:  Results for orders placed or performed during the hospital encounter of 07/19/15 (from the past 48 hour(s))  Urinalysis, Routine w reflex microscopic (not at [facility])     Status: Abnormal   Collection Time: 08/03/15  1:09 PM  Result Value Ref Range   Color, Urine AMBER (A) YELLOW    Comment: BIOCHEMICALS MAY BE AFFECTED BY COLOR   APPearance CLOUDY (A) CLEAR   Specific Gravity, Urine 1.023 1.005 - 1.030   pH 5.5 5.0 - 8.0   Glucose, UA NEGATIVE NEGATIVE mg/dL   Hgb urine dipstick MODERATE (A) NEGATIVE   Bilirubin Urine SMALL (A) NEGATIVE   Ketones, ur NEGATIVE NEGATIVE mg/dL   Protein, ur 30 (A) NEGATIVE mg/dL   Urobilinogen, UA 1.0 0.0 - 1.0 mg/dL   Nitrite NEGATIVE NEGATIVE   Leukocytes, UA LARGE (A) NEGATIVE  Urine microscopic-add on     Status: Abnormal   Collection Time: 08/03/15  1:09 PM  Result Value Ref Range   Squamous Epithelial / LPF RARE RARE   WBC, UA TOO NUMEROUS TO COUNT <3 WBC/hpf   RBC / HPF 3-6 <3 RBC/hpf   Bacteria, UA MANY (A) RARE   Urine-Other AMORPHOUS URATES/PHOSPHATES     Comment: MUCOUS PRESENT  Culture, Urine     Status: None   Collection Time: 08/03/15  1:52 PM  Result  Value Ref Range   Specimen Description URINE, RANDOM    Special Requests NONE    Culture      MULTIPLE SPECIES PRESENT, SUGGEST RECOLLECTION Performed at [facility]    Report Status 08/05/2015 FINAL   CBC     Status: Abnormal   Collection Time: 08/04/15  5:30 AM  Result Value Ref Range   WBC 13.7 (H) 4.0 - 10.5 K/uL   RBC 3.14 (L) 3.87 - 5.11 MIL/uL   Hemoglobin 9.3 (L) 12.0 - 15.0 g/dL   HCT 27.9 (L) 36.0 - 46.0 %   MCV 88.9 78.0 - 100.0 fL   MCH 29.6 26.0 - 34.0 pg   MCHC 33.3 30.0 - 36.0 g/dL   RDW 15.3 11.5 - 15.5 %   Platelets 103 (L) 150 - 400 K/uL    Comment: REPEATED TO VERIFY SPECIMEN CHECKED FOR CLOTS PLATELET COUNT CONFIRMED BY SMEAR LARGE PLATELETS PRESENT   Comprehensive metabolic panel     Status: Abnormal   Collection Time: 08/04/15  5:30 AM  Result Value Ref Range   Sodium 130 (L) 135 - 145 mmol/L   Potassium 3.9 3.5 - 5.1 mmol/L   Chloride 99 (L) 101 - 111 mmol/L   CO2 24 22 - 32 mmol/L   Glucose, Bld 120 (H) 65 - 99 mg/dL  BUN 9 6 - 20 mg/dL   Creatinine, Ser 0.93 0.44 - 1.00 mg/dL   Calcium 7.4 (L) 8.9 - 10.3 mg/dL   Total Protein 4.4 (L) 6.5 - 8.1 g/dL   Albumin 1.6 (L) 3.5 - 5.0 g/dL   AST 30 15 - 41 U/L   ALT 13 (L) 14 - 54 U/L   Alkaline Phosphatase 182 (H) 38 - 126 U/L   Total Bilirubin 0.9 0.3 - 1.2 mg/dL   GFR calc non Af Amer >60 >60 mL/min   GFR calc Af Amer >60 >60 mL/min    Comment: (NOTE) The eGFR has been calculated using the CKD EPI equation. This calculation has not been validated in all clinical situations. eGFR's persistently <60 mL/min signify possible Chronic Kidney Disease.    Anion gap 7 5 - 15    Radiology/Results: Dg Chest 2 View  08/03/2015   CLINICAL DATA:  Leukocytosis and weakness  EXAM: CHEST  2 VIEW  COMPARISON:  07/13/2015  FINDINGS: There is a left chest wall port a catheter with tip in the SVC. The heart size appears normal. Small right pleural effusion. Increased from previous exam. No airspace  consolidation identified.  IMPRESSION: 1. Small right effusion.  Increased from previous exam.   Electronically Signed   By: Kerby Moors M.D.   On: 08/03/2015 14:16    Anti-infectives: Anti-infectives    Start     Dose/Rate Route Frequency Ordered Stop   08/03/15 1500  ciprofloxacin (CIPRO) IVPB 400 mg  Status:  Discontinued     400 mg 200 mL/hr over 60 Minutes Intravenous Every 12 hours 08/03/15 1454 08/03/15 1455   08/03/15 1500  ciprofloxacin (CIPRO) IVPB 400 mg     400 mg 200 mL/hr over 60 Minutes Intravenous Every 12 hours 08/03/15 1455     07/27/15 2330  ciprofloxacin (CIPRO) IVPB 400 mg     400 mg 200 mL/hr over 60 Minutes Intravenous  Once 07/27/15 1746 07/28/15 0116   07/27/15 2100  metroNIDAZOLE (FLAGYL) IVPB 500 mg     500 mg 100 mL/hr over 60 Minutes Intravenous Every 8 hours 07/27/15 1746 07/28/15 0532   07/27/15 1300  metroNIDAZOLE (FLAGYL) IVPB 500 mg     500 mg 100 mL/hr over 60 Minutes Intravenous On call to O.R. 07/27/15 1153 07/27/15 1313   07/27/15 1300  ciprofloxacin (CIPRO) IVPB 400 mg     400 mg 200 mL/hr over 60 Minutes Intravenous On call to O.R. 07/27/15 1153 07/27/15 1329      Assessment/Plan: Problem List: Patient Active Problem List   Diagnosis Date Noted  . Anemia of chronic disease 08/03/2015  . Thrombocytopenia ([place]) 08/03/2015  . Leukopenia 08/03/2015  . Leukocytosis 08/03/2015  . Postoperative anemia due to acute blood loss 08/03/2015  . Colostomy in place [facility]) 07/29/2015  . Hydronephrosis of right kidney with h/o metastatic cancer near ureter 07/26/2015  . CKD (chronic kidney disease) stage 2, GFR 60-89 ml/min 07/25/2015  . Foreign body in digestive system   . 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 Occult GI bleeding   . Long term current use of anticoagulant therapy 07/08/2015  . Hypokalemia 07/08/2015  . Pulmonary emboli ([place]) 11/28/2014  . SBO (small bowel obstruction) s/p LOA & SB resection 07/27/2015 10/03/2014     Tolerating a diet Pain controlled Will d/c staples and place steri-strips prior to d/c  8 Days Post-Op    LOS: 17 days   Rosario Adie, MD  Colorectal and General  [facility]   08/05/2015 11:20 AM

## 2015-08-05 NOTE — Progress Notes (Signed)
Discharge instructions discussed with patient until no further questions voiced

## 2015-08-05 NOTE — Discharge Instructions (Signed)
[facility]      [facility] [phone]  OPEN ABDOMINAL SURGERY: POST OP INSTRUCTIONS  Always review your discharge instruction sheet given to you by the facility where your surgery was performed.  IF YOU HAVE DISABILITY OR FAMILY LEAVE FORMS, YOU MUST BRING THEM TO THE OFFICE FOR PROCESSING.  PLEASE DO NOT GIVE THEM TO YOUR DOCTOR.  1. A prescription for pain medication may be given to you upon discharge.  Take your pain medication as prescribed, if needed.  If narcotic pain medicine is not needed, then you may take acetaminophen (Tylenol) or ibuprofen (Advil) as needed. 2. Take your usually prescribed medications unless otherwise directed. 3. If you need a refill on your pain medication, please contact your pharmacy. They will contact our office to request authorization.  Prescriptions will not be filled after 5pm or on week-ends. 4. You should follow a light diet the first few days after arrival home, such as soup and crackers, pudding, etc.unless your doctor has advised otherwise. A high-fiber, low fat diet can be resumed as tolerated.   Be sure to include lots of fluids daily. Most patients will experience some swelling and bruising on the chest and neck area.  Ice packs will help.  Swelling and bruising can take several days to resolve 5. Most patients will experience some swelling and bruising in the area of the incision. Ice pack will help. Swelling and bruising can take several days to resolve..  6. It is common to experience some constipation if taking pain medication after surgery.  Increasing fluid intake and taking a stool softener will usually help or prevent this problem from occurring.  A mild laxative (Milk of Magnesia or Miralax) should be taken according to package directions if there are no bowel movements after 48 hours. 7.  You may have steri-strips (small skin tapes) in place directly over the incision.  These strips should be left on the skin for 7-10 days.  If your  surgeon used skin glue on the incision, you may shower in 24 hours.  The glue will flake off over the next 2-3 weeks.  Any sutures or staples will be removed at the office during your follow-up visit. You may find that a light gauze bandage over your incision may keep your staples from being rubbed or pulled. You may shower and replace the bandage daily. 8. ACTIVITIES:  You may resume regular (light) daily activities beginning the next day--such as daily self-care, walking, climbing stairs--gradually increasing activities as tolerated.  You may have sexual intercourse when it is comfortable.  Refrain from any heavy lifting or straining until approved by your doctor. a. You may drive when you no longer are taking prescription pain medication, you can comfortably wear a seatbelt, and you can safely maneuver your car and apply brakes b. Return to Work: ___________________________________ 9. You should see your doctor in the office for a follow-up appointment approximately two weeks after your surgery.  Make sure that you call for this appointment within a day or two after you arrive home to insure a convenient appointment time. OTHER INSTRUCTIONS:  _____________________________________________________________ _____________________________________________________________  WHEN TO CALL YOUR DOCTOR: 1. Fever over 101.0 2. Inability to urinate 3. Nausea and/or vomiting 4. Extreme swelling or bruising 5. Continued bleeding from incision. 6. Increased pain, redness, or drainage from the incision. 7. Difficulty swallowing or breathing 8. Muscle cramping or spasms. 9. Numbness or tingling in hands or feet or around lips.  The clinic staff is available to   answer your questions during regular business hours.  Please don't hesitate to call and ask to speak to one of the nurses if you have concerns.  For further questions, please visit [email or web address]   

## 2015-08-05 NOTE — Discharge Summary (Signed)
Physician Discharge Summary  Allison Mitchell ONG:[id number] DOB: Jan 09, 1961 DOA: 07/19/2015  PCP: [name], MD  Admit date: 07/19/2015 Discharge date: 08/05/2015  Recommendations for Outpatient Follow-up:  1.Please note anticoagulation not resumed due to risk of bleed  2.Will forward D/C summary to oncology, please address anticoagulation on outpatient basis if no evidence of further bleed and hemoglobin stable. LE doppler did not show DVT.  Discharge Diagnoses:  Principal Problem:   SBO (small bowel obstruction) s/p LOA & SB resection 07/27/2015 Active Problems:   Occult GI bleeding   Pulmonary emboli (HCC)   Long term current use of anticoagulant therapy   Hypokalemia   Foreign body in digestive system   CKD (chronic kidney disease) stage 2, GFR 60-89 ml/min   Cecal colon cancer s/o colectomy/colostomy 10/10/2014   Hydronephrosis of right kidney with h/o metastatic cancer near ureter   Colostomy in place [facility])   Anemia of chronic disease   Thrombocytopenia (HCC)   Leukopenia   Leukocytosis   Postoperative anemia due to acute blood loss    Discharge Condition: stable   Diet recommendation: as tolerated   History of present illness:  54 year old female with past medical history of depression, GERD, stage 3 colon cancer status post right hemicolectomy on 10/10/2014 by Dr. Hulen Skains.. She presented to [facility] ED with reports of maroon colored watery stool in ostomy bag. She was admitted for upper GI bleed. Her initial hemoglobin was 7.4. She has had RBC scan, capsule study and Meckel's all with unclear source of bleed. She has required multiple units of blood during this hospital stay. Patient was eventually taken to the OR on 9/23 for exploratory laparotomy for probable small bowel obstruction. Patient is s/p lysis of adhesions, small bowel resection with anastomosis, 07/27/15, Dr. Gurney Maxin.  Hospital Course:   Assessment/Plan:    Principal Problem: Acute blood loss anemia due  to occult GI bleeding / Anemia of chronic disease / Foreign body in digestive system from capsule endoscopy  - Patient has had an extensive workup throughout to the hospital stay for occult GI bleed.  - No clear source of bleed identified. RBC scan, Meckel's study all negative.  - Capsule endoscopy done but capsule never retrieved, not found in removed bowel nor inside the patient's abdomen post operatively.  - Patient has required multiple transfusions during this hospital stay. (2U pRBC 9/15, 2U 9/16 and 2U 9/21, 2U 9/23, 2U 9/27). - Patient has received platelets on 9/23 and FFP x 2 on 9/23 intraoperatively. - Hemoglobin stable.  Active Problems: SBO, s/p LOA and SB resection on 9/23 - Developed SBO during this hospital stay. - S/P LOA and small bowel resection 9/23.  - Surgery on board - Diet as tolerated   Right hydroureteronephrosis -Dr. Tresa Moore of urology recommended stent replacement if GFR declines or patient starts nephrotoxic chemo again   Essential hypertension  - At goal, 123/52.  Multifocal stage IIIB colon cancer, MMR normal, MSI stable, biopsy confirmed liver and probable peritoneal metastases, KRAS mutated  - S/p right hemicolectomy with colostomy  - Management per oncology   History of PE - Used to be on Lovenox then on coumadin  - Patient supposed to be on anticoagulation indefinitely however AC stopped because of risk of bleed.  - LE doppler negative for DVT    CKD stage 2 - Cr WNL  Chemotherapy induced pancytopenia / thrombocytopenia - Hemoglobin stable - Platelets stable  Leukocytosis / leukopenia - White blood cell count fluctuates from leukopenia to  now leukocytosis. - No evidence of infection - No need for abx on discharge. Stop cipro today.   Hypokalemia - Secondary to GI losses - Supplemented and WNL.  Hyponatremia - Secondary to dehydration, GI losses - Sodium 130  Severe protein calorie malnutrition - In the context of chronic  illness, malignancy - Diet as tolerated    DVT Prophylaxis  - SCD's bilaterally due to risk of bleed.    Code Status: Full.  Family Communication: plan of care discussed with the patient and her husband at the bedside    IV access:  Peripheral IV  Procedures and diagnostic studies:   RBC scan  Capsule study  Meckel scan  LOA & SB resection 07/27/2015 LE doppler 08/03/2015   Medical Consultants:  GI  Oncology  Surgery  Urology  Other Consultants:   IAnti-Infectives:   Ciprofloxacin  Metronidazole - peri operatively     Signed:  Leisa Lenz, MD  [name] 08/05/2015, 9:47 AM  Pager #: [phone]  Time spent in minutes: more than 30 minutes  Discharge Exam: Filed Vitals:   08/05/15 0549  BP: 123/52  Pulse: 87  Temp: 99 F (37.2 C)  Resp: 16   Filed Vitals:   08/04/15 0600 08/04/15 1300 08/04/15 2105 08/05/15 0549  BP: 140/61  114/45 123/52  Pulse: 95  86 87  Temp: 98.7 F (37.1 C)  98.4 F (36.9 C) 99 F (37.2 C)  TempSrc: Oral  Oral Oral  Resp: 16  18 16   Height:      Weight:  92.262 kg (203 lb 6.4 oz)    SpO2: 98%  99% 99%    General: Pt is alert, follows commands appropriately, not in acute distress Cardiovascular: Regular rate and rhythm, S1/S2 +, no murmurs Respiratory: Clear to auscultation bilaterally, no wheezing, no crackles, no rhonchi Abdominal: Soft, non tender, non distended, bowel sounds +, no guarding; colostomy in place  Extremities: no edema, no cyanosis, pulses palpable bilaterally DP and PT Neuro: Grossly nonfocal  Discharge Instructions  Discharge Instructions    Call MD for:  difficulty breathing, headache or visual disturbances    Complete by:  As directed      Call MD for:  persistant dizziness or light-headedness    Complete by:  As directed      Call MD for:  persistant nausea and vomiting    Complete by:  As directed      Call MD for:  severe uncontrolled pain    Complete  by:  As directed      Diet - low sodium heart healthy    Complete by:  As directed      Increase activity slowly    Complete by:  As directed             Medication List    STOP taking these medications        oxyCODONE 5 MG immediate release tablet  Commonly known as:  Oxy IR/ROXICODONE      TAKE these medications        acetaminophen 325 MG tablet  Commonly known as:  TYLENOL  Take 325 mg by mouth every 6 (six) hours as needed for moderate pain.     citalopram 20 MG tablet  Commonly known as:  CELEXA  Take 20 mg by mouth every morning.     HYDROcodone-acetaminophen 7.5-325 MG tablet  Commonly known as:  NORCO  Take 1-2 tablets by mouth every 6 (six) hours as needed for moderate pain  or severe pain.     lidocaine-prilocaine cream  Commonly known as:  EMLA  Apply 1 application topically as needed.     magnesium hydroxide 400 MG/5ML suspension  Commonly known as:  MILK OF MAGNESIA  Take 5 mLs by mouth daily as needed for mild constipation.     omeprazole 40 MG capsule  Commonly known as:  PRILOSEC  Take 40 mg by mouth daily.     ondansetron 8 MG tablet  Commonly known as:  ZOFRAN  Take 1 tablet (8 mg total) by mouth every 8 (eight) hours as needed for nausea or vomiting.     pantoprazole 40 MG tablet  Commonly known as:  PROTONIX  Take 1 tablet (40 mg total) by mouth 2 (two) times daily.     potassium chloride SA 20 MEQ tablet  Commonly known as:  K-DUR,KLOR-CON  Take 1 tablet (20 mEq total) by mouth daily. Take as directed.     PREVIDENT 5000 BOOSTER PLUS 1.1 % Pste  Generic drug:  Sodium Fluoride  1 application by Other route daily.     zolpidem 5 MG tablet  Commonly known as:  AMBIEN  Take 1 tablet (5 mg total) by mouth at bedtime as needed for sleep.           Follow-up Information    Follow up with [facility] On 08/10/2015.   Specialty:  General Surgery   Why:  staple removal by the nurse.  Be there at 1:30 PM for check in, your  appointment is at 2 PM.   Contact information:   [place] [address] [phone]       Follow up with Mickeal Skinner, MD On 08/17/2015.   Specialty:  General Surgery   Why:  Your appointment is at  10:50 AM, be there 30 minutes early for check in.   Contact information:   [place] [address] [phone]       Schedule an appointment as soon as possible for a visit with [name], MD.   Specialty:  Family Medicine   Contact information:   [address] [address] [phone]       Follow up with HOLT,JAMES B, MD. Schedule an appointment as soon as possible for a visit in 1 week.   Specialty:  Family Medicine   Why:  Follow up appt after recent hospitalization   Contact information:   [address] [place] [address] [phone]        The results of significant diagnostics from this hospitalization (including imaging, microbiology, ancillary and laboratory) are listed below for reference.    Significant Diagnostic Studies:  Dg Chest 2 View  08/03/2015   CLINICAL DATA:  Leukocytosis and weakness  EXAM: CHEST  2 VIEW  COMPARISON:  07/13/2015  FINDINGS: There is a left chest wall port a catheter with tip in the SVC. The heart size appears normal. Small right pleural effusion. Increased from previous exam. No airspace consolidation identified.  IMPRESSION: 1. Small right effusion.  Increased from previous exam.   Electronically Signed   By: Kerby Moors M.D.   On: 08/03/2015 14:16   Dg Abd 1 View  07/24/2015   CLINICAL DATA:  Generalized abdominal pain, ingested endoscopy capsule yesterday.  EXAM: ABDOMEN - 1 VIEW  COMPARISON:  None.  FINDINGS: The bowel gas pattern is normal. Endoscopy capsule is seen in left lower quadrant of abdomen. Stool is  noted in the rectum. No abnormal calcifications are noted.  IMPRESSION: No evidence of bowel obstruction or ileus. Endoscopy capsule seen in left lower  quadrant of abdomen.   Electronically Signed   By: Marijo Conception, M.D.   On: 07/24/2015 11:21   Nm Gi Blood Loss  07/20/2015   CLINICAL DATA:  Two month history of GI bleeding. Stool color has changed from black to red recently. 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  EXAM: NUCLEAR MEDICINE GASTROINTESTINAL BLEEDING SCAN  TECHNIQUE: Sequential abdominal images were obtained following intravenous administration of Tc-45mlabeled red blood cells.  RADIOPHARMACEUTICALS:  20.0 mCi Tc-915mn-vitro labeled red cells.  COMPARISON:  No prior nuclear imaging. CT abdomen and pelvis 03/27/2015.  FINDINGS: No abnormal activity involving the abdomen or pelvis to suggest acute GI bleeding. Blood pool activity in the heart and vessels, as expected. Intense activity in the spleen and mild activity in the liver, as expected. Normal excretion into the urinary tract with activity in the bladder.  IMPRESSION: No evidence of active GI bleeding.   Electronically Signed   By: ThEvangeline Dakin.D.   On: 07/20/2015 15:43   Nm Bowel Img Meckels  07/24/2015   CLINICAL DATA:  [age]ear old with 2 month history of GI bleeding. History of colon cancer and colostomy.  EXAM: NUCLEAR MEDICINE MECKEL'S SCAN  TECHNIQUE: Sequential abdominal images were obtained following intravenous injection of radiopharmaceutical.  RADIOPHARMACEUTICALS:  9.98 mCi technetium 9926mrtechnetate intravenously.  COMPARISON:  Nuclear medicine bleeding study 07/20/2015 and abdominal CT 03/27/2015.  FINDINGS: No abnormal activity is identified within the gastrointestinal tract. There is normal activity within the stomach and bladder.  IMPRESSION: Negative Meckel's scan.  There is no abnormal GI uptake.   Electronically Signed   By: WilRichardean SaleD.   On: 07/24/2015 09:16   Ct Entero Abd/pelvis W/cm  07/25/2015   CLINICAL DATA:   [age]ar old female with a history of cecal colon cancer diagnosed in December 2015 status post resection with end colostomy, status post radiation therapy and ongoing chemotherapy, with peritoneal carcinomatosis, presenting with GI bleeding, with a suspected small bowel source, with suspected retention of endoscopy capsule in the distal small bowel. Negative Meckel's scan and negative tagged red blood cell scintigraphy study.  EXAM: CT ABDOMEN AND PELVIS WITH CONTRAST (ENTEROGRAPHY)  TECHNIQUE: Multidetector CT of the abdomen and pelvis during bolus administration of intravenous contrast. Negative oral contrast VoLumen was given.  CONTRAST:  100m75mNIPAQUE IOHEXOL 300 MG/ML  SOLN  COMPARISON:  03/27/2015 CT of the abdomen and pelvis. 05/03/2015 MRI abdomen.  FINDINGS: Lower chest: Moderate layering right pleural effusion with associated passive dependent right lower lobe atelectasis.  Hepatobiliary: There is a 0.5 cm hypodense lesion in segment 7 of the right lower lobe (series 2/ image 13), characterized as a simple liver cyst on the 05/03/2015 MRI study. There is a heterogeneously enhancing 2.2 x 1.7 cm peritoneal tumor implant along the right posterior liver capsule (2/24), increased from 2.1 x 1.3 cm on 05/03/2015. No new tumor implants are noted adjacent to the liver. Normal gallbladder, with no radiopaque cholelithiasis. No biliary ductal dilatation.  Pancreas: Normal, with no mass or duct dilation.  Spleen: New mild splenomegaly (maximum axial splenic dimension 16.8 cm, previously 12.9 cm). No splenic mass.  Adrenals/Urinary Tract: Normal adrenals. There has been interval removal of the right nephroureteral stent. There is severe right hydroureteronephrosis to  the level of the proximal pelvic segment of the right ureter, worsened since 03/27/2015. There is an asymmetrically delayed right contrast nephrogram indicating right urinary tract obstruction. No left hydronephrosis. Normal caliber left ureter. No  renal mass. Collapsed and grossly normal urinary bladder.  Stomach/Bowel: Mildly distended fluid-filled stomach. Normal caliber small bowel. There is prominent circumferential wall thickening continuously involving the small bowel from the level of the mid small bowel to the neo terminal ileum in the right abdomen. The patient is status post ileocecal ectomy with ileocolic anastomosis in the right abdomen. There is an and colostomy in the left ventral abdominal wall. There is severe circumferential wall thickening and mucosal hyper enhancement of the entire residual colon, which is collapsed. The endoscopy capsule is present within a mid to distal small bowel loop in the right lower quadrant (series 3/ image 100). The remnant rectum is collapsed and unremarkable.  Vascular/Lymphatic: Atherosclerotic nonaneurysmal abdominal aorta. Patent portal, splenic, hepatic and renal veins. No abdominopelvic lymphadenopathy.  Reproductive: Grossly normal uterus. There is a somewhat tubular cystic 4.2 x 2.2 cm right adnexal cystic structure (2/60), previously 3.8 x 2.4 cm, not appreciably changed. There is a cystic 2.5 x 2.1 cm left adnexal cystic structure (2/66), which appears new. No gas or significant wall thickening associated with these adnexal cystic structures.  Other: The previously described 0.9 x 0.6 cm right anterior peritoneal implant measures 0.8 x 0.6 cm, not appreciably changed (2/41). No new peritoneal tumor implants are detected. New small volume ascites. Focal fluid collection adjacent to the colostomy stoma in the ventral left abdominal wall likely represents trapped ascitic fluid. No pneumoperitoneum.  Musculoskeletal: No aggressive appearing focal osseous lesions. Minimal degenerative changes in the visualized thoracolumbar spine.  IMPRESSION: 1. Prominent continuous circumferential wall thickening from the mid small bowel to the neo-terminal ileum. Prominent circumferential wall thickening of the entire  residual colon. These findings are most suggestive of a nonspecific infectious or inflammatory enterocolitis. There could be a component of non inflammatory bowel wall edema such as from hypoalbuminemia given the additional findings of small volume ascites and moderate right pleural effusion. 2. Endoscopy capsule is present within a mid to distal small bowel loop in the right lower quadrant. No significant small bowel dilatation or focal small bowel caliber transition to suggest bowel obstruction. 3. Mild interval growth of peritoneal tumor implant along the posterior right liver capsule. Stable separate subcentimeter right anterior peritoneal tumor implant. No new peritoneal tumor implants. 4. Right urinary tract obstruction at the level of the proximal pelvic segment of the right ureter with severe worsening right hydroureteronephrosis. Interval removal of the right nephroureteral stent. 5. New mild splenomegaly. 6. Bilateral cystic adnexal structures, somewhat tubular, differential includes hydrosalpinges.   Electronically Signed   By: Ilona Sorrel M.D.   On: 07/25/2015 16:40   Dg Abd 2 Views  07/27/2015   CLINICAL DATA:  Post op-bowel obstruction  Best obtainable images  EXAM: ABDOMEN - 2 VIEW  COMPARISON:  CT of the abdomen and pelvis 07/25/2015  FINDINGS: Surgical clips overlie the midline of the abdomen. Surgical drain is identified in the pelvis. Bowel gas pattern is nonobstructed. Orogastric tube tip overlies the level of the proximal stomach. Bowel sutures are identified in the right lower quadrant of the abdomen. Small right pleural effusion is noted.  IMPRESSION: 1. Postoperative changes. 2. Nonobstructive bowel gas pattern. 3. Right pleural effusion.   Electronically Signed   By: Nolon Nations M.D.   On: 07/27/2015 18:27  Dg Abd Portable 1v  07/27/2015   CLINICAL DATA:  Lifting for endoscopic capsule.  EXAM: PORTABLE ABDOMEN - 1 VIEW  COMPARISON:  None.  FINDINGS: Resected small bowel segment  is imaged. Suture lines are noted. No radiopaque endoscopic capsule visualized.  IMPRESSION: No radiopaque endoscopic capsule visualized.   Electronically Signed   By: Rolm Baptise M.D.   On: 07/27/2015 15:27    Microbiology: Recent Results (from the past 240 hour(s))  Surgical pcr screen     Status: None   Collection Time: 07/27/15 12:06 PM  Result Value Ref Range Status   MRSA, PCR NEGATIVE NEGATIVE Final   Staphylococcus aureus NEGATIVE NEGATIVE Final  Culture, Urine     Status: None   Collection Time: 08/03/15  1:52 PM  Result Value Ref Range Status   Specimen Description URINE, RANDOM  Final   Special Requests NONE  Final   Culture   Final    MULTIPLE SPECIES PRESENT, SUGGEST RECOLLECTION Performed at [facility]    Report Status 08/05/2015 FINAL  Final     Labs: Basic Metabolic Panel:  Recent Labs Lab 07/30/15 0525 07/31/15 0606 08/01/15 0525 08/04/15 0530  NA 138 140 140 130*  K 3.6 3.4* 3.7 3.9  CL 104 109 108 99*  CO2 28 29 26 24   GLUCOSE 125* 116* 104* 120*  BUN 11 7 7 9   CREATININE 0.92 0.71 0.87 0.93  CALCIUM 7.5* 7.2* 7.4* 7.4*  MG 2.2  --   --   --   PHOS 2.5  --   --   --    Liver Function Tests:  Recent Labs Lab 07/30/15 0525 08/04/15 0530  AST 16 30  ALT 7* 13*  ALKPHOS 91 182*  BILITOT 0.5 0.9  PROT 4.4* 4.4*  ALBUMIN 2.0* 1.6*   No results for input(s): LIPASE, AMYLASE in the last 168 hours. No results for input(s): AMMONIA in the last 168 hours. CBC:  Recent Labs Lab 07/31/15 0606 08/01/15 0525 08/02/15 0542 08/03/15 1058 08/04/15 0530  WBC 4.6 3.2* 6.8 17.8* 13.7*  HGB 7.1* 9.1* 9.9* 10.8* 9.3*  HCT 22.2* 27.9* 30.9* 33.3* 27.9*  MCV 91.7 90.3 89.3 89.0 88.9  PLT 98* 90* 105* 121* 103*   Cardiac Enzymes: No results for input(s): CKTOTAL, CKMB, CKMBINDEX, TROPONINI in the last 168 hours. BNP: BNP (last 3 results) No results for input(s): BNP in the last 8760 hours.  ProBNP (last 3 results) No results for  input(s): PROBNP in the last 8760 hours.  CBG: No results for input(s): GLUCAP in the last 168 hours.

## 2015-08-06 ENCOUNTER — Telehealth: Payer: Self-pay | Admitting: Hematology

## 2015-08-06 ENCOUNTER — Other Ambulatory Visit: Payer: Self-pay | Admitting: Hematology

## 2015-08-06 NOTE — Telephone Encounter (Signed)
s.w. pt and advised on OCT appt....ok and aware °

## 2015-08-09 ENCOUNTER — Other Ambulatory Visit: Payer: Self-pay | Admitting: *Deleted

## 2015-08-10 ENCOUNTER — Other Ambulatory Visit (HOSPITAL_BASED_OUTPATIENT_CLINIC_OR_DEPARTMENT_OTHER): Payer: BLUE CROSS/BLUE SHIELD

## 2015-08-10 ENCOUNTER — Telehealth: Payer: Self-pay | Admitting: *Deleted

## 2015-08-10 ENCOUNTER — Other Ambulatory Visit: Payer: Self-pay | Admitting: Hematology

## 2015-08-10 ENCOUNTER — Other Ambulatory Visit: Payer: Self-pay | Admitting: *Deleted

## 2015-08-10 ENCOUNTER — Ambulatory Visit (HOSPITAL_BASED_OUTPATIENT_CLINIC_OR_DEPARTMENT_OTHER): Payer: BLUE CROSS/BLUE SHIELD | Admitting: Nurse Practitioner

## 2015-08-10 ENCOUNTER — Ambulatory Visit
Admission: RE | Admit: 2015-08-10 | Discharge: 2015-08-10 | Disposition: A | Discharge status: Home / Self Care | Payer: BLUE CROSS/BLUE SHIELD | Source: Ambulatory Visit | Attending: Radiation Oncology | Admitting: Radiation Oncology

## 2015-08-10 ENCOUNTER — Ambulatory Visit: Payer: BLUE CROSS/BLUE SHIELD

## 2015-08-10 ENCOUNTER — Other Ambulatory Visit (HOSPITAL_BASED_OUTPATIENT_CLINIC_OR_DEPARTMENT_OTHER): Payer: BLUE CROSS/BLUE SHIELD | Admitting: *Deleted

## 2015-08-10 ENCOUNTER — Inpatient Hospital Stay (HOSPITAL_COMMUNITY)
Admission: AD | Admit: 2015-08-10 | Payer: BLUE CROSS/BLUE SHIELD | Source: Ambulatory Visit | Admitting: Internal Medicine

## 2015-08-10 VITALS — BP 137/58 | HR 91 | Temp 98.2°F | Resp 20 | Ht 68.0 in | Wt 195.1 lb

## 2015-08-10 DIAGNOSIS — K625 Hemorrhage of anus and rectum: Secondary | ICD-10-CM

## 2015-08-10 DIAGNOSIS — R112 Nausea with vomiting, unspecified: Secondary | ICD-10-CM

## 2015-08-10 DIAGNOSIS — R300 Dysuria: Secondary | ICD-10-CM

## 2015-08-10 DIAGNOSIS — E860 Dehydration: Secondary | ICD-10-CM | POA: Diagnosis not present

## 2015-08-10 DIAGNOSIS — E876 Hypokalemia: Secondary | ICD-10-CM

## 2015-08-10 DIAGNOSIS — C182 Malignant neoplasm of ascending colon: Secondary | ICD-10-CM

## 2015-08-10 DIAGNOSIS — K922 Gastrointestinal hemorrhage, unspecified: Secondary | ICD-10-CM

## 2015-08-10 DIAGNOSIS — N390 Urinary tract infection, site not specified: Secondary | ICD-10-CM

## 2015-08-10 DIAGNOSIS — E46 Unspecified protein-calorie malnutrition: Secondary | ICD-10-CM

## 2015-08-10 DIAGNOSIS — C180 Malignant neoplasm of cecum: Secondary | ICD-10-CM

## 2015-08-10 DIAGNOSIS — E8809 Other disorders of plasma-protein metabolism, not elsewhere classified: Secondary | ICD-10-CM

## 2015-08-10 DIAGNOSIS — Z95828 Presence of other vascular implants and grafts: Secondary | ICD-10-CM

## 2015-08-10 DIAGNOSIS — R195 Other fecal abnormalities: Secondary | ICD-10-CM

## 2015-08-10 DIAGNOSIS — R531 Weakness: Secondary | ICD-10-CM | POA: Diagnosis not present

## 2015-08-10 DIAGNOSIS — G893 Neoplasm related pain (acute) (chronic): Secondary | ICD-10-CM | POA: Diagnosis not present

## 2015-08-10 DIAGNOSIS — C787 Secondary malignant neoplasm of liver and intrahepatic bile duct: Secondary | ICD-10-CM

## 2015-08-10 DIAGNOSIS — C187 Malignant neoplasm of sigmoid colon: Secondary | ICD-10-CM

## 2015-08-10 DIAGNOSIS — I2699 Other pulmonary embolism without acute cor pulmonale: Secondary | ICD-10-CM

## 2015-08-10 DIAGNOSIS — C189 Malignant neoplasm of colon, unspecified: Secondary | ICD-10-CM

## 2015-08-10 LAB — URINALYSIS, MICROSCOPIC - CHCC
BLOOD: NEGATIVE
Bilirubin (Urine): NEGATIVE
GLUCOSE UR CHCC: NEGATIVE mg/dL
Ketones: NEGATIVE mg/dL
Nitrite: NEGATIVE
PROTEIN: 30 mg/dL
SPECIFIC GRAVITY, URINE: 1.005 (ref 1.003–1.035)
UROBILINOGEN UR: 0.2 mg/dL (ref 0.2–1)
pH: 7 (ref 4.6–8.0)

## 2015-08-10 LAB — COMPREHENSIVE METABOLIC PANEL (CC13)
ANION GAP: 10 meq/L (ref 3–11)
AST: 14 U/L (ref 5–34)
Albumin: 1.7 g/dL — ABNORMAL LOW (ref 3.5–5.0)
Alkaline Phosphatase: 245 U/L — ABNORMAL HIGH (ref 40–150)
BILIRUBIN TOTAL: 0.56 mg/dL (ref 0.20–1.20)
BUN: 5.6 mg/dL — ABNORMAL LOW (ref 7.0–26.0)
CALCIUM: 8.1 mg/dL — AB (ref 8.4–10.4)
CHLORIDE: 101 meq/L (ref 98–109)
CO2: 27 mEq/L (ref 22–29)
CREATININE: 0.7 mg/dL (ref 0.6–1.1)
Glucose: 129 mg/dl (ref 70–140)
Potassium: 3 mEq/L — CL (ref 3.5–5.1)
Sodium: 138 mEq/L (ref 136–145)
TOTAL PROTEIN: 5.3 g/dL — AB (ref 6.4–8.3)

## 2015-08-10 LAB — CBC WITH DIFFERENTIAL/PLATELET
BASO%: 0.4 % (ref 0.0–2.0)
Basophils Absolute: 0 10*3/uL (ref 0.0–0.1)
EOS ABS: 0 10*3/uL (ref 0.0–0.5)
EOS%: 0.2 % (ref 0.0–7.0)
HEMATOCRIT: 30.2 % — AB (ref 34.8–46.6)
HGB: 9.9 g/dL — ABNORMAL LOW (ref 11.6–15.9)
LYMPH#: 0.2 10*3/uL — AB (ref 0.9–3.3)
LYMPH%: 2.5 % — ABNORMAL LOW (ref 14.0–49.7)
MCH: 28.3 pg (ref 25.1–34.0)
MCHC: 32.8 g/dL (ref 31.5–36.0)
MCV: 86.1 fL (ref 79.5–101.0)
MONO#: 0.6 10*3/uL (ref 0.1–0.9)
MONO%: 7 % (ref 0.0–14.0)
NEUT#: 8 10*3/uL — ABNORMAL HIGH (ref 1.5–6.5)
NEUT%: 89.9 % — AB (ref 38.4–76.8)
PLATELETS: 204 10*3/uL (ref 145–400)
RBC: 3.51 10*6/uL — ABNORMAL LOW (ref 3.70–5.45)
RDW: 16 % — ABNORMAL HIGH (ref 11.2–14.5)
WBC: 8.9 10*3/uL (ref 3.9–10.3)

## 2015-08-10 MED ORDER — SODIUM CHLORIDE 0.9 % IV SOLN
INTRAVENOUS | Status: AC
  Administered 2015-08-10: 11:00:00 via INTRAVENOUS

## 2015-08-10 MED ORDER — SODIUM CHLORIDE 0.9 % IJ SOLN
10.0000 mL | INTRAMUSCULAR | Status: DC | PRN
Start: 2015-08-10 — End: 2015-08-13
  Administered 2015-08-10: 10 mL via INTRAVENOUS
  Filled 2015-08-10: qty 10

## 2015-08-10 MED ORDER — HEPARIN SOD (PORK) LOCK FLUSH 100 UNIT/ML IV SOLN
500.0000 [IU] | Freq: Once | INTRAVENOUS | Status: AC
  Administered 2015-08-10: 500 [IU] via INTRAVENOUS
  Filled 2015-08-10: qty 5

## 2015-08-10 MED ORDER — MORPHINE SULFATE 4 MG/ML IJ SOLN
2.0000 mg | Freq: Once | INTRAMUSCULAR | Status: AC
  Administered 2015-08-10: 2 mg via INTRAVENOUS
  Filled 2015-08-10 (×3): qty 1

## 2015-08-10 MED ORDER — SODIUM CHLORIDE 0.9 % IV SOLN
Freq: Once | INTRAVENOUS | Status: AC
  Administered 2015-08-10: 12:00:00 via INTRAVENOUS
  Filled 2015-08-10: qty 4

## 2015-08-10 NOTE — Telephone Encounter (Signed)
Copied OV notes, Labs, Radiology reports, Surgery notes & gave to pt to take to [facility] ED.  Notified ED that she was coming.  They have Care Everywhere & should be able to access info but hard copies given anyway.  Called [facility]/Radiology to prepare disc of radiology test for husband to pick up.

## 2015-08-10 NOTE — Telephone Encounter (Signed)
Tried to reach pt late yest pm & got vm.  Pt here today for labs.  States she cancelled appt with Dr Valere Dross because she doesn't feel well at all.  She still is nauseated & vomiting.  Will work in to see Selena Lesser NP.

## 2015-08-11 ENCOUNTER — Encounter: Payer: Self-pay | Admitting: Nurse Practitioner

## 2015-08-11 DIAGNOSIS — G893 Neoplasm related pain (acute) (chronic): Secondary | ICD-10-CM | POA: Insufficient documentation

## 2015-08-11 DIAGNOSIS — E46 Unspecified protein-calorie malnutrition: Secondary | ICD-10-CM | POA: Insufficient documentation

## 2015-08-11 DIAGNOSIS — E8809 Other disorders of plasma-protein metabolism, not elsewhere classified: Secondary | ICD-10-CM | POA: Insufficient documentation

## 2015-08-11 NOTE — Assessment & Plan Note (Signed)
Patient suffers with some chronic, generalized, in low back pain as baseline.  Patient states she's been unable to take any of her pain medication so she has been so nauseous within the past few days.  Patient was given morphine 2 mg IV while at the cancer center; with slightly improved back pain.

## 2015-08-11 NOTE — Assessment & Plan Note (Signed)
Patient is also complaining of some mild dysuria; and urinalysis obtained today revealed negative hematuria, negative nitrites, moderate leukocyte esterase, WBCs 11-20, and a few bacteria.  Urine culture result pending.   The initial plan was for the hospitalist  to manage/treat patient's urinary tract infection; the patient and her family made the decision to leave the cancer center and go to [facility] emergency department for further evaluation and management.

## 2015-08-11 NOTE — Assessment & Plan Note (Signed)
Albumen remains low at 1.7.  Patient was encouraged to push protein in her diet is much as possible.

## 2015-08-11 NOTE — Assessment & Plan Note (Signed)
Patient reports nausea, vomiting, and generalized weakness since her discharge from the hospital this past Sunday, 08/05/2015.  She feels dehydrated today.  Patient received IV fluid rehydration while at the cancer Center today.  She continued to appear fairly weak; and needed assistance with ambulation.

## 2015-08-11 NOTE — Assessment & Plan Note (Signed)
Patient has a history of rectal cancer; and was just discharged from hospital this past Sunday, 08/05/2015.  Following the recovery of a recent GI bleed.  Patient now reports increased nausea, vomiting, and generalized weakness.  She states that she has been unable to take any of her anti--nausea medications, or pain medications due to the nausea and vomiting.  On exam today.-Patient with bowel sounds positive in all 4 quads; and abdomen soft/nontender.  However, it was noted that patient had some obviously frank blood and liquid stool within her colostomy bag on exam.  Hemoccult-positive when checked.  Patient received IV fluid rehydration and Zofran while cancer Center today.  The initial plan was for the patient to be admitted via the hospitalist program and have Dr. Carlean Purl gastroenterologist consult with patient for further evaluation and management.  Patient reviewed this plan with her husband and family members; and made the decision to leave the [facility] to be transported to [facility] emergency department for further evaluation and management.  Sheliah Hatch called to the [facility] emergency department with a brief history and report.  Also, confirmed that the [facility] healthcare system used the "Care Everywhere" system;and would have access to all of our records. Gave pt's husband hard copies of all recent chart; and husband also planned on picking up CD disc of all recent scans prior to transporting pt to [facility] ED.   Called and cancelled planned direct admission to [facility].

## 2015-08-11 NOTE — Assessment & Plan Note (Signed)
Alkaline phosphatase increased from 182 up to 245.  We'll continue to monitor closely.

## 2015-08-11 NOTE — Assessment & Plan Note (Signed)
Patient has a history of rectal cancer; and was just discharged from hospital this past Sunday, 08/05/2015.  Following the recovery of a recent GI bleed.  Patient now reports increased nausea, vomiting, and generalized weakness.  She states that she has been unable to take any of her anti--nausea medications, or pain medications due to the nausea and vomiting.  On exam today.-Patient with bowel sounds positive in all 4 quads; and abdomen soft/nontender.  However, it was noted that patient had some obviously frank blood and liquid stool within her colostomy bag on exam.  Hemoccult-positive when checked.  Blood counts obtained today reveal a WBC of 8.9, ANC 8.0, hemoglobin 9.9, platelet count 204.    The initial plan was for the patient to be admitted via the hospitalist program and have Dr. Carlean Purl gastroenterologist consult with patient for further evaluation and management.  Patient reviewed this plan with her husband and family members; and made the decision to leave the [place] to be transported to [facility] emergency department for further evaluation and management.  Sheliah Hatch called to the [facility] emergency department with a brief history and report.  Also, confirmed that the [facility] healthcare system used the "Care Everywhere" system;and would have access to all of our records. Gave pt's husband hard copies of all recent chart; and husband also planned on picking up CD disc of all recent scans prior to transporting pt to [facility] ED.   Called and cancelled planned direct admission to [facility].

## 2015-08-11 NOTE — Progress Notes (Signed)
SYMPTOM MANAGEMENT CLINIC   HPI: Allison Mitchell 54 y.o. female diagnosed with rectal cancer.  Patient is status post FOLFOX chemotherapy last received in August 2016.   Patient has a history of rectal cancer; and was just discharged from hospital this past _0 -26-2016--  due to chemotherapy on coumadin  . Anticoagulated on Coumadin   . Dysuria   . S/P radiation therapy 03/02/15    completed pelvis/abd/50.4Gy  . Allergy     SEASONAL  . Blood transfusion without reported diagnosis   . Colorectal cancer, stage III [facility]) oncologist--  dr Stefani Dama    Dx  10-10-2014---Stage IIIC, [id number], Multifocal synchronous, Moderate differentiated Invasive, Mets to nodes (5 out of 17)/  s/p  Resection tumor cecum, terminal ileum, sigmoid/  currently chemoradiation therapy    Past Surgical History  Procedure Laterality Date  . Cystoscopy with retrograde pyelogram, ureteroscopy and stent placement Right 10/05/2014    Procedure: [place], URETEROSCOPY  AND STENT PLACEMENT;  Surgeon: Alexis Frock, MD;  Location: [facility];  Service: Urology;  Laterality: Right;  . Laparotomy N/A 10/10/2014    Procedure: EXPLORATORY LAPAROTOMY;  Surgeon: Doreen Salvage, MD;  Location: [place];  Service: General;  Laterality: N/A;  . Partial colectomy  10/10/2014    Procedure: PARTIAL SIGMOID COLECTOMY;  Surgeon: Doreen Salvage, MD;  Location: [place];  Service: General;;  . Colostomy  10/10/2014    Procedure: COLOSTOMY;  Surgeon: Doreen Salvage, MD;  Location: [place];  Service: General;;  . Colostomy revision  10/10/2014    Procedure: PARTIAL RIGHT COLECTOMY;  Surgeon: Doreen Salvage, MD;  Location: [place];  Service: General;;  .  Ureteral reimplantion Right 10/10/2014    Procedure: OPEN RIGHT URETERAL LYSIS ;  Surgeon: Alexis Frock, MD;  Location: [place];  Service: Urology;  Laterality: Right;  . Portacath placement N/A 10/17/2014    Procedure: INSERTION PORT-A-CATH LEFT SUBCLAVIAN AND MIDLINE INCISION STAPLE REMOVAL ;  Surgeon: Coralie Keens, MD;  Location: [place];  Service: General;  Laterality: N/A;  . Cystoscopy w/ retrogrades Right 01/10/2015     Procedure: CYSTOSCOPY WITH RETROGRADE PYELOGRAM;  Surgeon: Alexis Frock, MD;  Location: [facility];  Service: Urology;  Laterality: Right;  . Cystoscopy w/ ureteral stent placement Right 01/10/2015    Procedure: CYSTOSCOPY WITH STENT REPLACEMENT;  Surgeon: Alexis Frock, MD;  Location: [facility];  Service: Urology;  Laterality: Right;  . Colonoscopy    . Enteroscopy N/A 07/16/2015    Procedure: ENTEROSCOPY;  Surgeon: Gatha Mayer, MD;  Location: [place];  Service: Endoscopy;  Laterality: N/A;  . Givens capsule study N/A 07/22/2015    Procedure: GIVENS CAPSULE STUDY;  Surgeon: Manus Gunning, MD;  [facility];  Service: Gastroenterology;  Laterality: N/A;  . 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    is allergic to amoxicillin.    Medication List       This list is accurate as of: 08/10/15 11:59 PM.  Always use your most recent med list.               acetaminophen 325 MG tablet  Commonly known as:  TYLENOL  Take 325 mg by mouth every 6 (six)  hours as needed for moderate pain.     citalopram 20 MG tablet  Commonly known as:  CELEXA  Take 20 mg by mouth every morning.     HYDROcodone-acetaminophen 7.5-325 MG tablet  Commonly known as:  NORCO  Take 1-2 tablets by mouth every 6 (six) hours as needed for moderate pain or severe pain.     lidocaine-prilocaine cream  Commonly known as:  EMLA  Apply 1 application topically as needed.     magnesium hydroxide 400 MG/5ML suspension  Commonly known as:  MILK OF MAGNESIA  Take 5 mLs by mouth daily as needed for mild constipation.     omeprazole 40 MG capsule  Commonly known as:  PRILOSEC  Take 40 mg by mouth daily.     ondansetron 8 MG tablet  Commonly known as:  ZOFRAN  Take 1 tablet (8 mg total) by mouth every 8 (eight) hours as needed for nausea or vomiting.     pantoprazole 40 MG tablet  Commonly known as:  PROTONIX  Take 1 tablet (40 mg total) by mouth 2 (two) times daily.     potassium chloride SA 20 MEQ tablet  Commonly known as:  K-DUR,KLOR-CON  Take 1 tablet (20 mEq total) by mouth daily. Take as directed.     PREVIDENT 5000 BOOSTER PLUS 1.1 % Pste  Generic drug:  Sodium Fluoride  1 application by Other route daily.     zolpidem 5 MG tablet  Commonly known as:  AMBIEN  Take 1 tablet (5 mg total) by mouth at bedtime as needed for sleep.         PHYSICAL EXAMINATION  Oncology Vitals 08/10/2015 08/10/2015 08/05/2015 08/04/2015 08/04/2015 08/04/2015 08/03/2015  Height 173 cm - - - - - -  Weight 88.497 kg 88.497 kg - - 92.262 kg - -  Weight (lbs) 195 lbs 2 oz 195 lbs 2 oz - - 203 lbs 6 oz - -  BMI (kg/m2) 29.66 kg/m2 29.66 kg/m2 - - 30.93 kg/m2 - -  Temp 98.2 98.2 99 98.4 - 98.7 100.6  Pulse 91 91 87 86 - 95 103  Resp _0 - 16 18  SpO2 98 98 99 99 - 98 97  BSA (m2) 2.06 m2 2.06 m2 - - 2.1 m2 - -   BP Readings from Last 3 Encounters:  08/10/15 137/58  08/05/15 123/52  07/19/15 138/74    Physical Exam  Constitutional: She is oriented to person,  place, and time. Vital signs are normal. She appears dehydrated. She appears unhealthy.  Patient appears fatigued, weak, and chronically ill.  HENT:  Head: Normocephalic and atraumatic.  Eyes: Conjunctivae and EOM are normal. Pupils are equal, round, and reactive to light. Right eye exhibits no discharge. Left eye exhibits no discharge. No scleral icterus.  Neck: Normal range of motion. Neck supple. No JVD present. No tracheal deviation present. No thyromegaly present.  Cardiovascular: Normal rate, regular rhythm, normal heart sounds and intact distal pulses.   Pulmonary/Chest: Effort normal and breath sounds normal. No respiratory distress. She has no wheezes. She has no rales. She exhibits no tenderness.  Abdominal: Soft. Bowel sounds are normal. She exhibits no distension and no mass. There is no tenderness. There is no rebound and no guarding.  Musculoskeletal: Normal range of motion. She exhibits no edema or tenderness.  Lymphadenopathy:    She has no cervical adenopathy.  Neurological: She is alert and oriented to person, place, and time.  Patient appears weak; and requires some assistance with ambulation.  Skin: Skin is warm and dry. No rash noted. No erythema. There is pallor.  Psychiatric: Affect normal.  Nursing note and vitals reviewed.   LABORATORY DATA:. Orders Only on 08/10/2015  Component Date Value Ref Range Status  . Glucose 08/10/2015 Negative  Negative mg/dL Final  . Bilirubin (Urine) 08/10/2015 Negative  Negative Final  . Ketones 08/10/2015 Negative  Negative mg/dL Final  . Specific Gravity, Urine 08/10/2015 1.005  1.003 - 1.035 Final  . Blood 08/10/2015 Negative  Negative Final  . pH 08/10/2015 7.0  4.6 - 8.0 Final  . Protein 08/10/2015 30  Negative- <30 mg/dL Final  . Urobilinogen, UR 08/10/2015 0.2  0.2 - 1 mg/dL Final  . Nitrite 08/10/2015 Negative  Negative Final  . Leukocyte Esterase 08/10/2015 Moderate  Negative Final  . RBC / HPF 08/10/2015 0-2  0 - 2 Final   . WBC, UA 08/10/2015 11-20  0 - 2 Final  . Bacteria, UA 08/10/2015 Few  Negative- Trace Final  . Epithelial Cells 08/10/2015 Few  Negative- Few Final  Appointment on 08/10/2015  Component Date Value Ref Range Status  . WBC 08/10/2015 8.9  3.9 - 10.3 10e3/uL Final  .  NEUT# 08/10/2015 8.0* 1.5 - 6.5 10e3/uL Final  . HGB 08/10/2015 9.9* 11.6 - 15.9 g/dL Final  . HCT 08/10/2015 30.2* 34.8 - 46.6 % Final  . Platelets 08/10/2015 204  145 - 400 10e3/uL Final  . MCV 08/10/2015 86.1  79.5 - 101.0 fL Final  . MCH 08/10/2015 28.3  25.1 - 34.0 pg Final  . MCHC 08/10/2015 32.8  31.5 - 36.0 g/dL Final  . RBC 08/10/2015 3.51* 3.70 - 5.45 10e6/uL Final  . RDW 08/10/2015 16.0* 11.2 - 14.5 % Final  . lymph# 08/10/2015 0.2* 0.9 - 3.3 10e3/uL Final  . MONO# 08/10/2015 0.6  0.1 - 0.9 10e3/uL Final  . Eosinophils Absolute 08/10/2015 0.0  0.0 - 0.5 10e3/uL Final  . Basophils Absolute 08/10/2015 0.0  0.0 - 0.1 10e3/uL Final  . NEUT% 08/10/2015 89.9* 38.4 - 76.8 % Final  . LYMPH% 08/10/2015 2.5* 14.0 - 49.7 % Final  . MONO% 08/10/2015 7.0  0.0 - 14.0 % Final  . EOS% 08/10/2015 0.2  0.0 - 7.0 % Final  . BASO% 08/10/2015 0.4  0.0 - 2.0 % Final  . Sodium 08/10/2015 138  136 - 145 mEq/L Final  . Potassium 08/10/2015 3.0 Repeated and Verified* 3.5 - 5.1 mEq/L Final  . Chloride 08/10/2015 101  98 - 109 mEq/L Final  . CO2 08/10/2015 27  22 - 29 mEq/L Final  . Glucose 08/10/2015 129  70 - 140 mg/dl Final   Glucose reference range is for nonfasting patients. Fasting glucose reference range is 70- 100.  Marland Kitchen BUN 08/10/2015 5.6* 7.0 - 26.0 mg/dL Final  . Creatinine 08/10/2015 0.7  0.6 - 1.1 mg/dL Final  . Total Bilirubin 08/10/2015 0.56  0.20 - 1.20 mg/dL Final  . Alkaline Phosphatase 08/10/2015 245* 40 - 150 U/L Final  . AST 08/10/2015 14  5 - 34 U/L Final  . ALT 08/10/2015 <9  0 - 55 U/L Final  . Total Protein 08/10/2015 5.3* 6.4 - 8.3 g/dL Final  . Albumin 08/10/2015 1.7* 3.5 - 5.0 g/dL Final  . Calcium  08/10/2015 8.1* 8.4 - 10.4 mg/dL Final  . Anion Gap 08/10/2015 10  3 - 11 mEq/L Final  . EGFR 08/10/2015 >90  >90 ml/min/1.73 m2 Final   eGFR is calculated using the CKD-EPI Creatinine Equation (2009)     RADIOGRAPHIC STUDIES: No results found.  ASSESSMENT/PLAN:    Hypokalemia Potassium low at 3.0.  The initial plan was for the patient to be admitted; and the hospitalist to replace the potassium.  However, patient decided to leave the [place]; and have her family transport her to [facility] emergency department for further evaluation and management.  Occult GI bleeding Patient has a history of rectal cancer; and was just discharged from hospital this past Sunday, 08/05/2015.  Following the recovery of a recent GI bleed.  Patient now reports increased nausea, vomiting, and generalized weakness.  She states that she has been unable to take any of her anti--nausea medications, or pain medications due to the nausea and vomiting.  On exam today.-Patient with bowel sounds positive in all 4 quads; and abdomen soft/nontender.  However, it was noted that patient had some obviously frank blood and liquid stool within her colostomy bag on exam.  Hemoccult-positive when checked.  Blood counts obtained today reveal a WBC of 8.9, ANC 8.0, hemoglobin 9.9, platelet count 204.    The initial plan was for the patient to be admitted via the hospitalist program and have Dr. Carlean Purl gastroenterologist consult with  patient for further evaluation and management.  Patient reviewed this plan with her husband and family members; and made the decision to leave the [place] to be transported to [facility] emergency department for further evaluation and management.  Sheliah Hatch called to the [facility] emergency department with a brief history and report.  Also, confirmed that the [facility] healthcare system used the "Care Everywhere" system;and would have access to all of our records. Gave pt's husband hard copies of all recent chart; and  husband also planned on picking up CD disc of all recent scans prior to transporting pt to [facility] ED.   Called and cancelled planned direct admission to [facility].   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  Patient is scheduled to return for labs and a follow-up visit on 08/15/2015.  Nausea with vomiting Patient has a history of rectal cancer; and was just discharged from hospital this past Sunday, 08/05/2015.  Following the recovery of a recent GI bleed.  Patient now reports increased nausea, vomiting, and generalized weakness.  She states that she has been unable to take any of her anti--nausea medications, or pain medications due to the nausea and vomiting.  On exam today.-Patient with bowel sounds positive in all 4 quads; and abdomen soft/nontender.  However, it was noted that patient had some obviously frank blood and liquid stool within her colostomy bag on exam.  Hemoccult-positive when checked.  Patient received IV fluid rehydration as well as Zofran 8 mg IV while in the cancer center.  She was noted to vomit on one occasion while at the cancer center.  The initial plan was for the patient to be admitted via the hospitalist program and have Dr. Carlean Purl gastroenterologist consult with patient for further evaluation and management.  Patient reviewed this plan with her husband and family members; and made the decision to leave the [place] to be transported to [facility] emergency department for further evaluation and management.  Sheliah Hatch called to the [facility] emergency department with a brief history and report.  Also, confirmed that the [facility] healthcare system used the "Care Everywhere" system;and would have access to all of our records. Gave pt's husband hard copies of all recent chart; and husband also planned on picking up CD disc of all recent scans prior to transporting pt to [facility] ED.   Called and cancelled  planned direct admission to [facility].  Dehydration Patient has a history of rectal cancer; and was just discharged from hospital this past Sunday, 08/05/2015.  Following the recovery of a recent GI bleed.  Patient now reports increased nausea, vomiting, and generalized weakness.  She states that she has been unable to take any of her anti--nausea medications, or pain medications due to the nausea and vomiting.  On exam today.-Patient with bowel sounds positive in all 4 quads; and abdomen soft/nontender.  However, it was noted that patient had some obviously frank blood and liquid stool within her colostomy bag on exam.  Hemoccult-positive when checked.  Patient received IV fluid rehydration and Zofran while cancer Center today.  The initial plan was for the patient to be admitted via the hospitalist program and have Dr. Carlean Purl gastroenterologist consult with patient for further evaluation and management.  Patient reviewed this plan with her husband and family members; and made the decision to leave the [place] to be transported to [facility] emergency department for further evaluation and management.  Sheliah Hatch called  to the [facility] emergency department with a brief history and report.  Also, confirmed that the [facility] healthcare system used the "Care Everywhere" system;and would have access to all of our records. Gave pt's husband hard copies of all recent chart; and husband also planned on picking up CD disc of all recent scans prior to transporting pt to [facility] ED.   Called and cancelled planned direct admission to [facility].  Weakness Patient reports nausea, vomiting, and generalized weakness since her discharge from the hospital this past Sunday, 08/05/2015.  She feels dehydrated today.  Patient received IV fluid rehydration while at the cancer Center today.  She continued to appear fairly weak; and needed assistance with ambulation.  UTI (urinary tract  infection) Patient is also complaining of some mild dysuria; and urinalysis obtained today revealed negative hematuria, negative nitrites, moderate leukocyte esterase, WBCs 11-20, and a few bacteria.  Urine culture result pending.   The initial plan was for the hospitalist  to manage/treat patient's urinary tract infection; the patient and her family made the decision to leave the cancer center and go to [facility] emergency department for further evaluation and management.  Hyperphosphatemia Alkaline phosphatase increased from 182 up to 245.  We'll continue to monitor closely.  Neoplasm related pain Patient suffers with some chronic, generalized, in low back pain as baseline.  Patient states she's been unable to take any of her pain medication so she has been so nauseous within the past few days.  Patient was given morphine 2 mg IV while at the cancer center; with slightly improved back pain.  Hypoalbuminemia due to protein-calorie malnutrition ([place]) Albumen remains low at 1.7.  Patient was encouraged to push protein in her diet is much as possible.  Patient stated understanding of all instructions; and was in agreement with this plan of care. The patient knows to call the clinic with any problems, questions or concerns.   This was a shared visit with Dr. Burr Medico today.   Total time spent with patient was greater than 70 minutes (extended service time)  minutes;  with greater than 75 percent of that time spent in face to face counseling regarding patient's symptoms,  and coordination of care and follow up.  Disclaimer:This dictation was prepared with Dragon/digital dictation along with [employer]. Any transcriptional errors that result from this process are unintentional.  Drue Second, NP 08/11/2015   Attending addendum I have seen the patient, examined her. I agree with the assessment and and plan and have edited the notes.   I had a lengthy discussion with patient, her friend,  and husband regarding different treatment options. I think she needs hospital admission for recurrent GI bleeding. Chemotherapy is being held now due to her recovery from surgery and GI bleeding issues. We offered admission to [facility] with further imaging and GI workup for bleeding. After back and forth discussion, patient and her husband opted going to [facility] emergency room for evaluation and admission. She wants to have a second opinion at [facility] also for her cancer management. I supported her decision. My nurse discussed with you in the emergency room and sent her medical records to them, including her imaging scans.  I clearly told her I'll still remain to be available for her, if she decides to return to [place] for treatment or management.  Truitt Merle  08/13/2015

## 2015-08-11 NOTE — Assessment & Plan Note (Signed)
Potassium low at 3.0.  The initial plan was for the patient to be admitted; and the hospitalist to replace the potassium.  However, patient decided to leave the [place]; and have her family transport her to [facility] emergency department for further evaluation and management.

## 2015-08-11 NOTE — Assessment & Plan Note (Signed)
Patient has a history of rectal cancer; and was just discharged from hospital this past Sunday, 08/05/2015.  Following the recovery of a recent GI bleed.  Patient now reports increased nausea, vomiting, and generalized weakness.  She states that she has been unable to take any of her anti--nausea medications, or pain medications due to the nausea and vomiting.  On exam today.-Patient with bowel sounds positive in all 4 quads; and abdomen soft/nontender.  However, it was noted that patient had some obviously frank blood and liquid stool within her colostomy bag on exam.  Hemoccult-positive when checked.  Patient received IV fluid rehydration as well as Zofran 8 mg IV while in the cancer center.  She was noted to vomit on one occasion while at the cancer center.  The initial plan was for the patient to be admitted via the hospitalist program and have Dr. Carlean Purl gastroenterologist consult with patient for further evaluation and management.  Patient reviewed this plan with her husband and family members; and made the decision to leave the [place] to be transported to [facility] emergency department for further evaluation and management.  Sheliah Hatch called to the [facility] emergency department with a brief history and report.  Also, confirmed that the [facility] healthcare system used the "Care Everywhere" system;and would have access to all of our records. Gave pt's husband hard copies of all recent chart; and husband also planned on picking up CD disc of all recent scans prior to transporting pt to [facility] ED.   Called and cancelled planned direct admission to [facility].

## 2015-08-11 NOTE — Assessment & Plan Note (Signed)
Patient received her last FOLFOX chemotherapy on 06/28/2015.  Patient is scheduled to return for labs and a follow-up visit on 08/15/2015.

## 2015-08-13 LAB — URINE CULTURE

## 2015-08-15 ENCOUNTER — Telehealth: Payer: Self-pay | Admitting: Hematology

## 2015-08-15 ENCOUNTER — Other Ambulatory Visit: Payer: BLUE CROSS/BLUE SHIELD

## 2015-08-15 ENCOUNTER — Encounter: Payer: BLUE CROSS/BLUE SHIELD | Admitting: Hematology

## 2015-08-15 NOTE — Telephone Encounter (Signed)
lvm for pt regarding todays cx appt.Allison KitchenMarland Mitchell

## 2015-08-21 NOTE — Progress Notes (Signed)
This encounter was created in error - please disregard.

## 2015-08-22 ENCOUNTER — Telehealth: Payer: Self-pay | Admitting: *Deleted

## 2015-08-22 ENCOUNTER — Inpatient Hospital Stay
Admission: RE | Admit: 2015-08-22 | Discharge: 2015-08-22 | Disposition: A | Discharge status: Home / Self Care | Payer: BLUE CROSS/BLUE SHIELD | Source: Ambulatory Visit | Attending: Radiation Oncology | Admitting: Radiation Oncology

## 2015-08-22 ENCOUNTER — Ambulatory Visit
Admission: RE | Admit: 2015-08-22 | Payer: BLUE CROSS/BLUE SHIELD | Source: Ambulatory Visit | Admitting: Radiation Oncology

## 2015-08-22 NOTE — Telephone Encounter (Signed)
Called patient home phone, hasn't set up voice message as yet,unable to leave a message,pt appt was for 1015am today,   10:31 AM

## 2015-08-23 ENCOUNTER — Encounter (HOSPITAL_COMMUNITY): Payer: Self-pay

## 2016-03-03 DEATH — deceased

## 2016-10-29 ENCOUNTER — Other Ambulatory Visit: Payer: Self-pay | Admitting: Nurse Practitioner
